# Patient Record
Sex: Male | Born: 1950
Health system: Southern US, Community
[De-identification: ages and names within clinical notes are randomized; demographics above are authoritative.]

## PROBLEM LIST (undated history)

## (undated) DIAGNOSIS — E785 Hyperlipidemia, unspecified: Secondary | ICD-10-CM

## (undated) DIAGNOSIS — I517 Cardiomegaly: Secondary | ICD-10-CM

## (undated) DIAGNOSIS — F419 Anxiety disorder, unspecified: Secondary | ICD-10-CM

## (undated) DIAGNOSIS — I82409 Acute embolism and thrombosis of unspecified deep veins of unspecified lower extremity: Secondary | ICD-10-CM

## (undated) DIAGNOSIS — I10 Essential (primary) hypertension: Secondary | ICD-10-CM

## (undated) DIAGNOSIS — E119 Type 2 diabetes mellitus without complications: Secondary | ICD-10-CM

## (undated) DIAGNOSIS — C649 Malignant neoplasm of unspecified kidney, except renal pelvis: Secondary | ICD-10-CM

## (undated) HISTORY — DX: Essential (primary) hypertension: I10

## (undated) HISTORY — DX: Anxiety disorder, unspecified: F41.9

## (undated) HISTORY — DX: Malignant neoplasm of unspecified kidney, except renal pelvis: C64.9

## (undated) HISTORY — DX: Hyperlipidemia, unspecified: E78.5

## (undated) HISTORY — DX: Acute embolism and thrombosis of unspecified deep veins of unspecified lower extremity: I82.409

## (undated) HISTORY — DX: Type 2 diabetes mellitus without complications: E11.9

---

## 2011-11-18 ENCOUNTER — Emergency Department (HOSPITAL_COMMUNITY): Payer: BC Managed Care – PPO

## 2011-11-18 ENCOUNTER — Inpatient Hospital Stay (HOSPITAL_COMMUNITY)
Admission: EM | Admit: 2011-11-18 | Discharge: 2011-11-20 | DRG: 089 | Disposition: A | Discharge status: Home / Self Care | Payer: BC Managed Care – PPO | Attending: Internal Medicine | Admitting: Internal Medicine

## 2011-11-18 ENCOUNTER — Encounter (HOSPITAL_COMMUNITY): Payer: Self-pay | Admitting: *Deleted

## 2011-11-18 DIAGNOSIS — D1809 Hemangioma of other sites: Secondary | ICD-10-CM | POA: Diagnosis present

## 2011-11-18 DIAGNOSIS — F17200 Nicotine dependence, unspecified, uncomplicated: Secondary | ICD-10-CM | POA: Diagnosis present

## 2011-11-18 DIAGNOSIS — K760 Fatty (change of) liver, not elsewhere classified: Secondary | ICD-10-CM | POA: Diagnosis present

## 2011-11-18 DIAGNOSIS — D72829 Elevated white blood cell count, unspecified: Secondary | ICD-10-CM

## 2011-11-18 DIAGNOSIS — E669 Obesity, unspecified: Secondary | ICD-10-CM | POA: Diagnosis present

## 2011-11-18 DIAGNOSIS — R0902 Hypoxemia: Secondary | ICD-10-CM

## 2011-11-18 DIAGNOSIS — F3289 Other specified depressive episodes: Secondary | ICD-10-CM | POA: Diagnosis present

## 2011-11-18 DIAGNOSIS — F411 Generalized anxiety disorder: Secondary | ICD-10-CM | POA: Diagnosis present

## 2011-11-18 DIAGNOSIS — E871 Hypo-osmolality and hyponatremia: Secondary | ICD-10-CM

## 2011-11-18 DIAGNOSIS — F1010 Alcohol abuse, uncomplicated: Secondary | ICD-10-CM | POA: Diagnosis present

## 2011-11-18 DIAGNOSIS — Z720 Tobacco use: Secondary | ICD-10-CM | POA: Diagnosis present

## 2011-11-18 DIAGNOSIS — I10 Essential (primary) hypertension: Secondary | ICD-10-CM | POA: Diagnosis present

## 2011-11-18 DIAGNOSIS — J189 Pneumonia, unspecified organism: Principal | ICD-10-CM | POA: Diagnosis present

## 2011-11-18 DIAGNOSIS — F329 Major depressive disorder, single episode, unspecified: Secondary | ICD-10-CM | POA: Diagnosis present

## 2011-11-18 DIAGNOSIS — Z6833 Body mass index (BMI) 33.0-33.9, adult: Secondary | ICD-10-CM

## 2011-11-18 DIAGNOSIS — Z79899 Other long term (current) drug therapy: Secondary | ICD-10-CM

## 2011-11-18 DIAGNOSIS — K7689 Other specified diseases of liver: Secondary | ICD-10-CM | POA: Diagnosis present

## 2011-11-18 DIAGNOSIS — R51 Headache: Secondary | ICD-10-CM | POA: Diagnosis present

## 2011-11-18 HISTORY — DX: Cardiomegaly: I51.7

## 2011-11-18 LAB — CBC WITH DIFFERENTIAL/PLATELET
Lymphocytes Relative: 16 % (ref 12–46)
Lymphs Abs: 3 10*3/uL (ref 0.7–4.0)
Neutrophils Relative %: 78 % — ABNORMAL HIGH (ref 43–77)
Platelets: 302 10*3/uL (ref 150–400)
RBC: 4.09 MIL/uL — ABNORMAL LOW (ref 4.22–5.81)
WBC: 19.2 10*3/uL — ABNORMAL HIGH (ref 4.0–10.5)

## 2011-11-18 LAB — BASIC METABOLIC PANEL
CO2: 27 mEq/L (ref 19–32)
Calcium: 8.9 mg/dL (ref 8.4–10.5)
GFR calc non Af Amer: >90 mL/min (ref >90)
Sodium: 130 mEq/L — ABNORMAL LOW (ref 135–145)

## 2011-11-18 LAB — LACTIC ACID, PLASMA: Lactic Acid, Venous: 0.8 mmol/L (ref 0.5–2.2)

## 2011-11-18 MED ORDER — ALBUTEROL SULFATE (5 MG/ML) 0.5% IN NEBU: 5.0000 mg | INHALATION_SOLUTION | Freq: Once | RESPIRATORY_TRACT | Status: DC

## 2011-11-18 MED ORDER — SODIUM CHLORIDE 0.9 % IV SOLN
Freq: Once | INTRAVENOUS | Status: AC
  Administered 2011-11-18: 1000 mL via INTRAVENOUS

## 2011-11-18 MED ORDER — LEVOFLOXACIN IN D5W 750 MG/150ML IV SOLN
750.0000 mg | Freq: Once | INTRAVENOUS | Status: AC
  Administered 2011-11-18: 750 mg via INTRAVENOUS
  Filled 2011-11-18: qty 150

## 2011-11-18 MED ORDER — ONDANSETRON HCL 4 MG/2ML IJ SOLN: 4.0000 mg | Freq: Three times a day (TID) | INTRAMUSCULAR | Status: AC | PRN

## 2011-11-18 MED ORDER — IPRATROPIUM BROMIDE 0.02 % IN SOLN: 0.5000 mg | Freq: Once | RESPIRATORY_TRACT | Status: DC

## 2011-11-18 MED ORDER — IPRATROPIUM BROMIDE 0.02 % IN SOLN
1.0000 mg | Freq: Once | RESPIRATORY_TRACT | Status: AC
  Administered 2011-11-18: 1 mg via RESPIRATORY_TRACT
  Filled 2011-11-18: qty 5

## 2011-11-18 MED ORDER — ALBUTEROL SULFATE (5 MG/ML) 0.5% IN NEBU
10.0000 mg | INHALATION_SOLUTION | Freq: Once | RESPIRATORY_TRACT | Status: AC
  Administered 2011-11-18: 10 mg via RESPIRATORY_TRACT
  Filled 2011-11-18: qty 2

## 2011-11-18 MED ORDER — ALBUTEROL SULFATE (5 MG/ML) 0.5% IN NEBU: 2.5000 mg | INHALATION_SOLUTION | RESPIRATORY_TRACT | Status: AC

## 2011-11-18 MED ORDER — SODIUM CHLORIDE 0.9 % IV SOLN
INTRAVENOUS | Status: AC
  Administered 2011-11-18: 22:00:00 via INTRAVENOUS

## 2011-11-18 NOTE — ED Provider Notes (Signed)
History     CSN: [id number]  Arrival date & time 11/18/11  1652   First MD Initiated Contact with Patient 11/18/11 1755      Chief Complaint  Patient presents with  . Shortness of Breath    HPI Pt was seen at 1820.  Per pt, c/o gradual onset and worsening of persistent cough, wheezing, and SOB for the past 5 days.  Has been associated with subjective home fevers and "chills."  Pt was eval by his PMD today  PTA and then sent to the ED for further eval.  Denies CP/palpitations, no abd pain, no back pain, no N/V/D, no rash.     Past Medical History  Diagnosis Date  . Enlarged heart     History reviewed. No pertinent past surgical history.   History  Substance Use Topics  . Smoking status: Current Every Day Smoker  . Smokeless tobacco: Not on file  . Alcohol Use: Yes      Review of Systems ROS: Statement: All systems negative except as marked or noted in the HPI; Constitutional: Negative for fever and +chills. ; ; Eyes: Negative for eye pain, redness and discharge. ; ; ENMT: Negative for ear pain, hoarseness, nasal congestion, sinus pressure and sore throat. ; ; Cardiovascular: Negative for chest pain, palpitations, diaphoresis, and peripheral edema. ; ; Respiratory: +cough, wheezing, SOB. Negative for stridor. ; ; Gastrointestinal: Negative for nausea, vomiting, diarrhea, abdominal pain, blood in stool, hematemesis, jaundice and rectal bleeding. . ; ; Genitourinary: Negative for dysuria, flank pain and hematuria. ; ; Musculoskeletal: Negative for back pain and neck pain. Negative for swelling and trauma.; ; Skin: Negative for pruritus, rash, abrasions, blisters, bruising and skin lesion.; ; Neuro: Negative for headache, lightheadedness and neck stiffness. Negative for weakness, altered level of consciousness , altered mental status, extremity weakness, paresthesias, involuntary movement, seizure and syncope.     Allergies  Review of patient's allergies indicates no known  allergies.  Home Medications   Current Outpatient Rx  Name Route Sig Dispense Refill  . DM-GUAIFENESIN ER 30-600 MG PO TB12 Oral Take 1 tablet by mouth every 12 (twelve) hours.    [name] DIPHENHYDRAMINE-APAP (SLEEP) 25-500 MG PO TABS Oral Take 2 tablets by mouth at bedtime as needed. For pain/sleep      BP 127/71  Pulse 91  Temp 99.2 F (37.3 C) (Oral)  Resp 18  Wt 219 lb (99.338 kg)  SpO2 92%  Physical Exam 1825: Physical examination:  Nursing notes reviewed; Vital signs and O2 SAT reviewed;  Constitutional: Well developed, Well nourished, Well hydrated, In no acute distress; Head:  Normocephalic, atraumatic; Eyes: EOMI, PERRL, No scleral icterus; ENMT: Mouth and pharynx normal, Mucous membranes moist; Neck: Supple, Full range of motion, No lymphadenopathy; Cardiovascular: Regular rate and rhythm, No gallop; Respiratory: Breath sounds coarse & equal bilaterally, with insp and exp wheezes bilat. No audible wheezing.  Speaking full sentences, Normal respiratory effort/excursion; Chest: Nontender, Movement normal; Abdomen: Soft, Nontender, Nondistended, Normal bowel sounds;; Extremities: Pulses normal, No tenderness, No edema, No calf edema or asymmetry.; Neuro: AA&Ox3, Major CN grossly intact.  Speech clear. No gross focal motor or sensory deficits in extremities.; Skin: Color normal, Warm, Dry.   ED Course  Procedures    MDM  MDM Reviewed: nursing note and vitals Interpretation: labs and x-ray Total time providing critical care: 30-74 minutes. This excludes time spent performing separately reportable procedures and services. Consults: admitting MD   CRITICAL CARE Performed by: Laray Anger Total critical care  time: 35 Critical care time was exclusive of separately billable procedures and treating other patients. Critical care was necessary to treat or prevent imminent or life-threatening deterioration. Critical care was time spent personally by me on the following activities:  development of treatment plan with patient and/or surrogate as well as nursing, discussions with consultants, evaluation of patient's response to treatment, examination of patient, obtaining history from patient or surrogate, ordering and performing treatments and interventions, ordering and review of laboratory studies, ordering and review of radiographic studies, pulse oximetry and re-evaluation of patient's condition.    Dg Chest 2 View 11/18/2011  *RADIOLOGY REPORT*  Clinical Data: Cough, shortness of breath  CHEST - 2 VIEW  Comparison: None.  Findings: Moderate patchy airspace opacities in the right lower lobe, more confluent in the posterior and medial basal segments. There is mild left perihilar interstitial prominence without confluent infiltrate.  No definite effusion.  Heart size upper limits normal.  Mildly tortuous thoracic aorta.  IMPRESSION:  1.  Right lower lobe pneumonia.  Recommend follow-up to confirm appropriate resolution, exclude underlying lesion.   Original Report Authenticated By: Osa Craver, M.D.       Results for orders placed during the hospital encounter of 11/18/11  BASIC METABOLIC PANEL      Component Value Range   Sodium 130 (*) 135 - 145 mEq/L   Potassium 3.5  3.5 - 5.1 mEq/L   Chloride 90 (*) 96 - 112 mEq/L   CO2 27  19 - 32 mEq/L   Glucose, Bld 152 (*) 70 - 99 mg/dL   BUN 10  6 - 23 mg/dL   Creatinine, Ser 0.98 (*) 0.50 - 1.35 mg/dL   Calcium 8.9  8.4 - 11.9 mg/dL   GFR calc non Af Amer >90  >90 mL/min   GFR calc Af Amer >90  >90 mL/min  CBC WITH DIFFERENTIAL      Component Value Range   WBC 19.2 (*) 4.0 - 10.5 K/uL   RBC 4.09 (*) 4.22 - 5.81 MIL/uL   Hemoglobin 13.5  13.0 - 17.0 g/dL   HCT 14.7  82.9 - 56.2 %   MCV 95.4  78.0 - 100.0 fL   MCH 33.0  26.0 - 34.0 pg   MCHC 34.6  30.0 - 36.0 g/dL   RDW 13.0  86.5 - 78.4 %   Platelets 302  150 - 400 K/uL   Neutrophils Relative 78 (*) 43 - 77 %   Neutro Abs 14.9 (*) 1.7 - 7.7 K/uL    Lymphocytes Relative 16  12 - 46 %   Lymphs Abs 3.0  0.7 - 4.0 K/uL   Monocytes Relative 6  3 - 12 %   Monocytes Absolute 1.2 (*) 0.1 - 1.0 K/uL   Eosinophils Relative 0  0 - 5 %   Eosinophils Absolute 0.1  0.0 - 0.7 K/uL   Basophils Relative 0  0 - 1 %   Basophils Absolute 0.0  0.0 - 0.1 K/uL  LACTIC ACID, PLASMA      Component Value Range   Lactic Acid, Venous 0.8  0.5 - 2.2 mmol/L     1945:  Pt's Sats on arrival 88% R/A, increased to 90-92% on O2 2L N/C.  Sats 97-98% on continuous neb.  Denies CP.  IV levaquin for CAP ordered.  SBP stable and lactic acid normal; doubt sepsis at this time.  Dx and testing d/w pt and family.  Questions answered.  Verb understanding, agreeable to admit.  T/C to [facility]. Phillips Odor, case discussed, including:  HPI, pertinent PM/SHx, VS/PE, dx testing, ED course and treatment:  Agreeable to observation admit.       Laray Anger, DO 11/20/11 2253

## 2011-11-18 NOTE — ED Notes (Signed)
Sent from dr Deitra Mayo office, sob, fever, cough, chills,

## 2011-11-18 NOTE — ED Notes (Signed)
Confirmed with Dr. Clarene Duke that no blood cultures were needed before administering levaquin.

## 2011-11-18 NOTE — ED Notes (Signed)
Pt c/o cold symptoms x 1 week and has been taking mucinex.  C/O worsening cough, fever, chills, and SOB.

## 2011-11-18 NOTE — ED Notes (Signed)
Room assignment given . Will call report when nurse who has been called in is present for report

## 2011-11-18 NOTE — ED Notes (Signed)
Pt had taken off 02 and sat on room air was 89%.  Placed pt back on 2 liters and sat increased to 94%.  Pt 97% on breathing treatment.

## 2011-11-19 ENCOUNTER — Observation Stay (HOSPITAL_COMMUNITY): Payer: BC Managed Care – PPO

## 2011-11-19 DIAGNOSIS — K7689 Other specified diseases of liver: Secondary | ICD-10-CM

## 2011-11-19 DIAGNOSIS — J189 Pneumonia, unspecified organism: Secondary | ICD-10-CM | POA: Diagnosis present

## 2011-11-19 DIAGNOSIS — R0902 Hypoxemia: Secondary | ICD-10-CM | POA: Diagnosis present

## 2011-11-19 DIAGNOSIS — K760 Fatty (change of) liver, not elsewhere classified: Secondary | ICD-10-CM | POA: Diagnosis present

## 2011-11-19 DIAGNOSIS — F1010 Alcohol abuse, uncomplicated: Secondary | ICD-10-CM

## 2011-11-19 DIAGNOSIS — E871 Hypo-osmolality and hyponatremia: Secondary | ICD-10-CM | POA: Diagnosis present

## 2011-11-19 DIAGNOSIS — D72829 Elevated white blood cell count, unspecified: Secondary | ICD-10-CM | POA: Diagnosis present

## 2011-11-19 DIAGNOSIS — Z720 Tobacco use: Secondary | ICD-10-CM | POA: Diagnosis present

## 2011-11-19 LAB — URINALYSIS, ROUTINE W REFLEX MICROSCOPIC
Glucose, UA: NEGATIVE mg/dL
Leukocytes, UA: NEGATIVE
Protein, ur: 30 mg/dL — AB
Specific Gravity, Urine: >1.03 — ABNORMAL HIGH (ref 1.005–1.030)

## 2011-11-19 LAB — HEPATIC FUNCTION PANEL
ALT: 31 U/L (ref 0–53)
AST: 21 U/L (ref 0–37)
Albumin: 2.8 g/dL — ABNORMAL LOW (ref 3.5–5.2)
Alkaline Phosphatase: 90 U/L (ref 39–117)
Total Bilirubin: 0.4 mg/dL (ref 0.3–1.2)
Total Protein: 7.1 g/dL (ref 6.0–8.3)

## 2011-11-19 LAB — BASIC METABOLIC PANEL
BUN: 9 mg/dL (ref 6–23)
CO2: 27 mEq/L (ref 19–32)
Chloride: 95 mEq/L — ABNORMAL LOW (ref 96–112)
Creatinine, Ser: 0.47 mg/dL — ABNORMAL LOW (ref 0.50–1.35)
GFR calc Af Amer: >90 mL/min (ref >90)
Potassium: 3.6 mEq/L (ref 3.5–5.1)

## 2011-11-19 LAB — CBC
HCT: 38.2 % — ABNORMAL LOW (ref 39.0–52.0)
MCV: 95.7 fL (ref 78.0–100.0)
Platelets: 312 10*3/uL (ref 150–400)
RBC: 3.99 MIL/uL — ABNORMAL LOW (ref 4.22–5.81)
RDW: 13 % (ref 11.5–15.5)
WBC: 20.2 10*3/uL — ABNORMAL HIGH (ref 4.0–10.5)

## 2011-11-19 LAB — URINE MICROSCOPIC-ADD ON

## 2011-11-19 LAB — EXPECTORATED SPUTUM ASSESSMENT W GRAM STAIN, RFLX TO RESP C

## 2011-11-19 LAB — GLUCOSE, CAPILLARY

## 2011-11-19 MED ORDER — FOLIC ACID 1 MG PO TABS
1.0000 mg | ORAL_TABLET | Freq: Every day | ORAL | Status: DC
  Administered 2011-11-19 – 2011-11-20 (×2): 1 mg via ORAL
  Filled 2011-11-19 (×2): qty 1

## 2011-11-19 MED ORDER — ALBUTEROL SULFATE (5 MG/ML) 0.5% IN NEBU
2.5000 mg | INHALATION_SOLUTION | Freq: Four times a day (QID) | RESPIRATORY_TRACT | Status: DC
  Administered 2011-11-19 – 2011-11-20 (×6): 2.5 mg via RESPIRATORY_TRACT
  Filled 2011-11-19 (×5): qty 0.5

## 2011-11-19 MED ORDER — LEVOFLOXACIN 500 MG PO TABS
750.0000 mg | ORAL_TABLET | Freq: Every day | ORAL | Status: DC
  Administered 2011-11-19 – 2011-11-20 (×2): 750 mg via ORAL
  Filled 2011-11-19 (×2): qty 1

## 2011-11-19 MED ORDER — FOLIC ACID 5 MG/ML IJ SOLN
INTRAMUSCULAR | Status: AC
  Filled 2011-11-19: qty 0.2

## 2011-11-19 MED ORDER — ALUM & MAG HYDROXIDE-SIMETH 200-200-20 MG/5ML PO SUSP: 30.0000 mL | Freq: Four times a day (QID) | ORAL | Status: DC | PRN

## 2011-11-19 MED ORDER — M.V.I. ADULT IV INJ
INJECTION | INTRAVENOUS | Status: AC
  Filled 2011-11-19: qty 10

## 2011-11-19 MED ORDER — THIAMINE HCL 100 MG/ML IJ SOLN
100.0000 mg | Freq: Every day | INTRAMUSCULAR | Status: DC
  Filled 2011-11-19: qty 2

## 2011-11-19 MED ORDER — VITAMIN B-1 100 MG PO TABS
100.0000 mg | ORAL_TABLET | Freq: Every day | ORAL | Status: DC
  Administered 2011-11-19 – 2011-11-20 (×2): 100 mg via ORAL
  Filled 2011-11-19 (×2): qty 1

## 2011-11-19 MED ORDER — OXYCODONE HCL 5 MG PO TABS: 5.0000 mg | ORAL_TABLET | ORAL | Status: DC | PRN

## 2011-11-19 MED ORDER — LEVOFLOXACIN IN D5W 750 MG/150ML IV SOLN
750.0000 mg | INTRAVENOUS | Status: DC
  Filled 2011-11-19: qty 150

## 2011-11-19 MED ORDER — TRAZODONE HCL 50 MG PO TABS: 25.0000 mg | ORAL_TABLET | Freq: Every evening | ORAL | Status: DC | PRN

## 2011-11-19 MED ORDER — THIAMINE HCL 100 MG/ML IJ SOLN
Freq: Once | INTRAVENOUS | Status: AC
  Administered 2011-11-19: 02:00:00 via INTRAVENOUS
  Filled 2011-11-19: qty 1000

## 2011-11-19 MED ORDER — ONDANSETRON HCL 4 MG PO TABS: 4.0000 mg | ORAL_TABLET | Freq: Four times a day (QID) | ORAL | Status: DC | PRN

## 2011-11-19 MED ORDER — IPRATROPIUM BROMIDE 0.02 % IN SOLN
0.5000 mg | Freq: Four times a day (QID) | RESPIRATORY_TRACT | Status: DC
  Administered 2011-11-19 – 2011-11-20 (×6): 0.5 mg via RESPIRATORY_TRACT
  Filled 2011-11-19 (×5): qty 2.5

## 2011-11-19 MED ORDER — SODIUM CHLORIDE 0.9 % IJ SOLN
INTRAMUSCULAR | Status: AC
  Administered 2011-11-19: 12:00:00
  Filled 2011-11-19: qty 3

## 2011-11-19 MED ORDER — ALBUTEROL SULFATE (5 MG/ML) 0.5% IN NEBU: 2.5000 mg | INHALATION_SOLUTION | RESPIRATORY_TRACT | Status: DC | PRN

## 2011-11-19 MED ORDER — ONDANSETRON HCL 4 MG/2ML IJ SOLN: 4.0000 mg | Freq: Four times a day (QID) | INTRAMUSCULAR | Status: DC | PRN

## 2011-11-19 MED ORDER — LORAZEPAM 1 MG PO TABS: 1.0000 mg | ORAL_TABLET | ORAL | Status: DC | PRN

## 2011-11-19 MED ORDER — FOLIC ACID 5 MG/ML IJ SOLN
1.0000 mg | Freq: Every day | INTRAMUSCULAR | Status: DC
  Filled 2011-11-19: qty 0.2

## 2011-11-19 MED ORDER — FOLIC ACID 5 MG/ML IJ SOLN
1.0000 mg | Freq: Every day | INTRAMUSCULAR | Status: DC
  Filled 2011-11-19 (×3): qty 0.2

## 2011-11-19 MED ORDER — ENOXAPARIN SODIUM 40 MG/0.4ML ~~LOC~~ SOLN
40.0000 mg | SUBCUTANEOUS | Status: DC
  Administered 2011-11-19 – 2011-11-20 (×2): 40 mg via SUBCUTANEOUS
  Filled 2011-11-19 (×3): qty 0.4

## 2011-11-19 MED ORDER — THIAMINE HCL 100 MG/ML IJ SOLN: 100.0000 mg | Freq: Every day | INTRAMUSCULAR | Status: DC

## 2011-11-19 MED ORDER — POLYETHYLENE GLYCOL 3350 17 G PO PACK: 17.0000 g | PACK | Freq: Every day | ORAL | Status: DC | PRN

## 2011-11-19 MED ORDER — THIAMINE HCL 100 MG/ML IJ SOLN
INTRAMUSCULAR | Status: AC
  Filled 2011-11-19: qty 2

## 2011-11-19 MED ORDER — SODIUM CHLORIDE 0.9 % IJ SOLN
INTRAMUSCULAR | Status: AC
  Filled 2011-11-19: qty 3

## 2011-11-19 NOTE — Progress Notes (Signed)
pts O2 sat was measured at 90% at rest, while ambulating, O2 sat increased to 91-95%. Pt tolerated ambulation very well, with minimal SOB. George Anthony

## 2011-11-19 NOTE — H&P (Addendum)
[facility] History and Physical  George Anthony ZOX:[id number] DOB: 12-30-1950 DOA: 11/18/2011  Referring physician: EDP PCP: Josue Hector, MD  Specialists: none  Chief Complaint: Cough  HPI: George Anthony is a 61 y.o. male with a medical history significant for alcoholism, heavy tobacco abuse, and hypertension who presented to [facility] c/o of cough with 5 days of fevers, chills, weakness and shortness of breath. He has had poor PO intake for the past 3 days, last ETOH drink was 4 days ago. Denies and NV, abdominal pain or chest pain. CXR in ED showed significant RLL PNA, he was also hypoxemic at 88% requiring O2 and febrile. Admitted for IV abx and monitoring for CAP.  Review of Systems: Review of Systems  Constitutional: Positive for fever, chills, malaise/fatigue and diaphoresis.  HENT: Positive for congestion and sore throat.   Eyes: Negative.   Respiratory: Positive for cough, sputum production and shortness of breath.   Cardiovascular: Negative.   Gastrointestinal: Positive for heartburn. Negative for nausea, vomiting and abdominal pain.  Genitourinary: Negative.   Musculoskeletal: Negative.   Skin: Negative for itching and rash.  Neurological: Positive for dizziness, weakness and headaches.  Endo/Heme/Allergies: Negative.   Psychiatric/Behavioral: Positive for depression and substance abuse. The patient is nervous/anxious.      Past Medical History  Diagnosis Date  . Enlarged heart    History reviewed. No pertinent past surgical history. Social History:  reports that he has been smoking.  He does not have any smokeless tobacco history on file. He reports that he drinks alcohol. He reports that he does not use illicit drugs. No Known Allergies  History reviewed. No pertinent family history.  No family history of Lung Malignancy.  Prior to Admission medications   Medication Sig Start Date End Date Taking? Authorizing Provider  dextromethorphan-guaiFENesin (MUCINEX  DM) 30-600 MG per 12 hr tablet Take 1 tablet by mouth every 12 (twelve) hours.   Yes Historical Provider, MD  diphenhydramine-acetaminophen (TYLENOL PM) 25-500 MG TABS Take 2 tablets by mouth at bedtime as needed. For pain/sleep   Yes Historical Provider, MD   Physical Exam: Filed Vitals:   11/18/11 2150 11/19/11 0007 11/19/11 0008 11/19/11 0046  BP: 128/76     Pulse: 100     Temp: 98 F (36.7 C)     TempSrc: Oral     Resp: 20     Height:   5\' 7"  (1.702 m)   Weight: 95.9 kg (211 lb 6.7 oz)     SpO2: 95% 95%  95%     General:  Ill appearing gentleman, NAD, Alert, cooperative  Eyes: PERRL  ENT: dry MM  Neck: supple  Cardiovascular: Tachycardic, no mrg  Respiratory: course diffuse rhonchi, crackes right base>left  Abdomen: large obese abdomen, question ascites-but no fluid wave or stigmata of liver disease.  Skin: no rashes  Musculoskeletal: normal  Psychiatric: slightly depressed appearing, poorly groomed and disheveled  Neurologic: non-focal  Labs on Admission:  Basic Metabolic Panel:  Lab 11/19/11 8119 11/18/11 1858  NA -- 130*  K -- 3.5  CL -- 90*  CO2 -- 27  GLUCOSE -- 152*  BUN -- 10  CREATININE -- 0.47*  CALCIUM -- 8.9  MG 2.4 --  PHOS 2.6 --   Liver Function Tests:  Lab 11/19/11 0018  AST 21  ALT 31  ALKPHOS 90  BILITOT 0.4  PROT 7.1  ALBUMIN 2.8*   No results found for this basename: LIPASE:5,AMYLASE:5 in the last 168 hours No results found for  this basename: AMMONIA:5 in the last 168 hours CBC:  Lab 11/18/11 1858  WBC 19.2*  NEUTROABS 14.9*  HGB 13.5  HCT 39.0  MCV 95.4  PLT 302   Radiological Exams on Admission: Dg Chest 2 View  11/18/2011  *RADIOLOGY REPORT*  Clinical Data: Cough, shortness of breath  CHEST - 2 VIEW  Comparison: None.  Findings: Moderate patchy airspace opacities in the right lower lobe, more confluent in the posterior and medial basal segments. There is mild left perihilar interstitial prominence without  confluent infiltrate.  No definite effusion.  Heart size upper limits normal.  Mildly tortuous thoracic aorta.  IMPRESSION:  1.  Right lower lobe pneumonia.  Recommend follow-up to confirm appropriate resolution, exclude underlying lesion.   Original Report Authenticated By: Osa Craver, M.D.     EKG: pending at admission.  Assessment/Plan Principal Problem:  *CAP (community acquired pneumonia) Active Problems:  Hyponatremia  Hypoxia  Leukocytosis  Alcohol abuse  Tobacco abuse   1. Community Acquired PNA in heavy smoker with ETOH abuse and overall poor health, was hypoxic on arrival to ED. Fever and Leukocytosis.  Started on IV Levaquin in ED, will continue this regimen  Blood Cultures, other PNA core measures orders.  Sputum Cx pending and Strep P urinary antigen  Supplemental O2  Strong rec to quit smoking, will need interval f/u CXR for resolution to make sure this isn't related to a malignancy.  IV fluids  2.  ETOH abuse/ Tobacco Abuse: For his addictive disease he will be monitored on CIWA and started on Thiamine, Folate and Nicoderm  3. ?Ascites: LFT's ok, low albumin- will check abdominal US to look for ascites.  Code Status: Full Code Family Communication: discussed care plan with patient Disposition Plan:  1-2 days anticipated stay -home when medically stable.  Time spent: 46  [facility] Pager [phone]  If 7PM-7AM, please contact night-coverage [email or web address] Password [email or web address] 11/19/2011, 3:46 AM

## 2011-11-19 NOTE — Progress Notes (Signed)
UR Chart Review Completed  

## 2011-11-19 NOTE — Progress Notes (Signed)
ANTIBIOTIC CONSULT NOTE - INITIAL  Pharmacy Consult for Levaquin Indication: rule out pneumonia  No Known Allergies  Patient Measurements: Height: 5\' 7"  (170.2 cm) Weight: 211 lb 6.7 oz (95.9 kg) IBW/kg (Calculated) : 66.1   Vital Signs: Temp: 100.9 F (38.3 C) (10/30 0649) Temp src: Oral (10/30 0649) BP: 144/80 mmHg (10/30 0649) Pulse Rate: 74  (10/30 0649) Intake/Output from previous day: 10/29 0701 - 10/30 0700 In: 742.1 [I.V.:742.1] Out: 200 [Urine:200] Intake/Output from this shift:    Labs:  [facility] 11/19/11 0528 11/18/11 1858  WBC 20.2* 19.2*  HGB 12.8* 13.5  PLT 312 302  LABCREA -- --  CREATININE 0.47* 0.47*   Estimated Creatinine Clearance: 108.3 ml/min (by C-G formula based on Cr of 0.47). No results found for this basename: VANCOTROUGH:2,VANCOPEAK:2,VANCORANDOM:2,GENTTROUGH:2,GENTPEAK:2,GENTRANDOM:2,TOBRATROUGH:2,TOBRAPEAK:2,TOBRARND:2,AMIKACINPEAK:2,AMIKACINTROU:2,AMIKACIN:2, in the last 72 hours   Microbiology: Recent Results (from the past 720 hour(s))  CULTURE, BLOOD (ROUTINE X 2)     Status: Normal (Preliminary result)   Collection Time   11/19/11 12:12 AM      Component Value Range Status Comment   Specimen Description Blood LEFT ARM   Final    Special Requests BOTTLES DRAWN AEROBIC AND ANAEROBIC 8 CC EACH   Final    Culture PENDING   Incomplete    Report Status PENDING   Incomplete   CULTURE, BLOOD (ROUTINE X 2)     Status: Normal (Preliminary result)   Collection Time   11/19/11 12:19 AM      Component Value Range Status Comment   Specimen Description Blood LEFT HAND   Final    Special Requests BOTTLES DRAWN AEROBIC AND ANAEROBIC 8 CC EACH   Final    Culture PENDING   Incomplete    Report Status PENDING   Incomplete     Medical History: Past Medical History  Diagnosis Date  . Enlarged heart     Medications:  Scheduled:    . sodium chloride   Intravenous Once  . sodium chloride   Intravenous STAT  . albuterol  10 mg Nebulization  Once  . albuterol  2.5 mg Nebulization Q4H  . albuterol  2.5 mg Nebulization Q6H  . enoxaparin (LOVENOX) injection  40 mg Subcutaneous Q24H  . folic acid  1 mg Intravenous Daily  . ipratropium  0.5 mg Nebulization Q6H  . ipratropium  1 mg Nebulization Once  . levofloxacin  750 mg Intravenous Once  . levofloxacin (LEVAQUIN) IV  750 mg Intravenous Q24H  . general admission iv infusion   Intravenous Once  . thiamine  100 mg Intravenous Daily  . DISCONTD: albuterol  5 mg Nebulization Once  . DISCONTD: folic acid  1 mg Intravenous Daily  . DISCONTD: ipratropium  0.5 mg Nebulization Once  . DISCONTD: thiamine  100 mg Intravenous Daily   Assessment: 61yo male admitted with fevers, chills, weakness, and SOB.  Good renal fxn.  Estimated Creatinine Clearance: 108.3 ml/min (by C-G formula based on Cr of 0.47). Started on Levaquin 750mg  iv q24hrs.  No adjustment needed at this time.  Goal of Therapy:  Eradicate infection.  Plan: Continue Levaquin 750mg  IV q24hrs Monitor labs, renal fxn, and cultures per protocol  Margo Aye, Shantee Hayne A 11/19/2011,8:11 AM

## 2011-11-19 NOTE — Progress Notes (Signed)
Chart reviewed.  Subjective: Feels better. Has not walked around March. Appetite good. Vague about his desire to quit smoking and drinking.  Objective: Vital signs in last 24 hours: Filed Vitals:   11/19/11 0702 11/19/11 1100 11/19/11 1400 11/19/11 1438  BP:  147/84 134/55   Pulse:  83 80   Temp:  98.9 F (37.2 C) 99 F (37.2 C)   TempSrc:  Axillary Oral   Resp:  20 20   Height:      Weight:      SpO2: 92% 91% 94% 95%   Weight change:   Intake/Output Summary (Last 24 hours) at 11/19/11 1720 Last data filed at 11/19/11 1200  Gross per 24 hour  Intake 1225.08 ml  Output    200 ml  Net 1025.08 ml   General: Comfortable. Mumbles answers yes or no. Eating supper. Not wearing oxygen Lungs: Mild rhonchi. No rales or wheeze. Cardiovascular regular rate rhythm without murmurs gallops rubs Abdomen obese, soft nontender Extremities no clubbing cyanosis or edema next Neurologic: Alert oriented cooperative no tremor noted.  Lab Results: Basic Metabolic Panel:  Lab 11/19/11 1610 11/19/11 0018 11/18/11 1858  NA 133* -- 130*  K 3.6 -- 3.5  CL 95* -- 90*  CO2 27 -- 27  GLUCOSE 155* -- 152*  BUN 9 -- 10  CREATININE 0.47* -- 0.47*  CALCIUM 8.9 -- 8.9  MG -- 2.4 --  PHOS -- 2.6 --   Liver Function Tests:  Lab 11/19/11 0018  AST 21  ALT 31  ALKPHOS 90  BILITOT 0.4  PROT 7.1  ALBUMIN 2.8*   No results found for this basename: LIPASE:2,AMYLASE:2 in the last 168 hours No results found for this basename: AMMONIA:2 in the last 168 hours CBC:  Lab 11/19/11 0528 11/18/11 1858  WBC 20.2* 19.2*  NEUTROABS -- 14.9*  HGB 12.8* 13.5  HCT 38.2* 39.0  MCV 95.7 95.4  PLT 312 302   Cardiac Enzymes: No results found for this basename: CKTOTAL:3,CKMB:3,CKMBINDEX:3,TROPONINI:3 in the last 168 hours BNP: No results found for this basename: PROBNP:3 in the last 168 hours D-Dimer: No results found for this basename: DDIMER:2 in the last 168 hours CBG:  Lab 11/19/11 0742  GLUCAP  146*   Hemoglobin A1C: No results found for this basename: HGBA1C in the last 168 hours Fasting Lipid Panel: No results found for this basename: CHOL,HDL,LDLCALC,TRIG,CHOLHDL,LDLDIRECT in the last 960 hours Thyroid Function Tests:  Lab 11/19/11 0018  TSH 0.718  T4TOTAL --  FREET4 --  T3FREE --  THYROIDAB --   Coagulation: No results found for this basename: LABPROT:4,INR:4 in the last 168 hours Anemia Panel: No results found for this basename: VITAMINB12,FOLATE,FERRITIN,TIBC,IRON,RETICCTPCT in the last 168 hours Urine Drug Screen: Drugs of Abuse  No results found for this basename: labopia, cocainscrnur, labbenz, amphetmu, thcu, labbarb    Alcohol Level: No results found for this basename: ETH:2 in the last 168 hours Urinalysis:  Lab 11/19/11 0158  COLORURINE YELLOW  LABSPEC >1.030*  PHURINE 6.0  GLUCOSEU NEGATIVE  HGBUR MODERATE*  BILIRUBINUR MODERATE*  KETONESUR 40*  PROTEINUR 30*  UROBILINOGEN 2.0*  NITRITE NEGATIVE  LEUKOCYTESUR NEGATIVE   Micro Results: Recent Results (from the past 240 hour(s))  CULTURE, BLOOD (ROUTINE X 2)     Status: Normal (Preliminary result)   Collection Time   11/19/11 12:12 AM      Component Value Range Status Comment   Specimen Description Blood LEFT ARM   Final    Special Requests BOTTLES DRAWN AEROBIC  AND ANAEROBIC 8 CC EACH   Final    Culture NO GROWTH <24 HRS   Final    Report Status PENDING   Incomplete   CULTURE, BLOOD (ROUTINE X 2)     Status: Normal (Preliminary result)   Collection Time   11/19/11 12:19 AM      Component Value Range Status Comment   Specimen Description Blood LEFT HAND   Final    Special Requests BOTTLES DRAWN AEROBIC AND ANAEROBIC 8 CC EACH   Final    Culture NO GROWTH <24 HRS   Final    Report Status PENDING   Incomplete    Studies/Results: Dg Chest 2 View  11/18/2011  *RADIOLOGY REPORT*  Clinical Data: Cough, shortness of breath  CHEST - 2 VIEW  Comparison: None.  Findings: Moderate patchy  airspace opacities in the right lower lobe, more confluent in the posterior and medial basal segments. There is mild left perihilar interstitial prominence without confluent infiltrate.  No definite effusion.  Heart size upper limits normal.  Mildly tortuous thoracic aorta.  IMPRESSION:  1.  Right lower lobe pneumonia.  Recommend follow-up to confirm appropriate resolution, exclude underlying lesion.   Original Report Authenticated By: Osa Craver, M.D.    US Abdomen Complete  11/19/2011  *RADIOLOGY REPORT*  Clinical Data:  Clinical concern for ascites.  Evaluate liver.  COMPLETE ABDOMINAL ULTRASOUND  Comparison:  None  Findings:  Gallbladder:  Multiple gallbladder polyps are noted.  No gallstonesor pericholecystic fluid.  There is mild diffuse gallbladder wall thickening.  Common bile duct:  Borderline common bile duct with maximal measurement of 8.7 mm in the porta hepatis and 6.3 mm in the pancreatic head.  Liver:  Overall increased echogenicity with coarse echotexture suggesting fatty infiltration.  There is a small, 1.2 x 1.2 x 1.3 cm echogenic focus in the left lobe which is likely a benign hemangioma.  IVC:  Normal caliber.  Pancreas:  Not well visualized due to overlying bowel gas.  Spleen:  Normal size and echogenicity without focal lesions.  Right Kidney:  12.0 cm in length. Normal renal cortical thickness and echogenicity without focal lesions or hydronephrosis.  Left Kidney:  13.1 cm in length. Normal renal cortical thickness and echogenicity without focal lesions or hydronephrosis.  Abdominal aorta:  Normal caliber.  IMPRESSION:  1.  Gallbladder polyps and minimal gallbladder wall thickening but no gallstones or pericholecystic fluid. 2.  Borderline caliber common bile duct. 3.  Poor visualization of the pancreas. 4.  Normal sonographic appearance of the spleen and both kidneys. 5.  Diffuse fatty infiltration of the liver and left hepatic lobe hemangioma.   Original Report Authenticated  By: P. Loralie Champagne, M.D.    Scheduled Meds:   . sodium chloride   Intravenous Once  . sodium chloride   Intravenous STAT  . albuterol  10 mg Nebulization Once  . albuterol  2.5 mg Nebulization Q4H  . albuterol  2.5 mg Nebulization Q6H  . enoxaparin (LOVENOX) injection  40 mg Subcutaneous Q24H  . folic acid  1 mg Intravenous Daily  . ipratropium  0.5 mg Nebulization Q6H  . ipratropium  1 mg Nebulization Once  . levofloxacin  750 mg Intravenous Once  . levofloxacin (LEVAQUIN) IV  750 mg Intravenous Q24H  . general admission iv infusion   Intravenous Once  . sodium chloride      . sodium chloride      . thiamine  100 mg Intravenous Daily  . DISCONTD: albuterol  5 mg Nebulization Once  . DISCONTD: folic acid  1 mg Intravenous Daily  . DISCONTD: ipratropium  0.5 mg Nebulization Once  . DISCONTD: thiamine  100 mg Intravenous Daily   Continuous Infusions:  PRN Meds:.albuterol, alum & mag hydroxide-simeth, ondansetron (ZOFRAN) IV, ondansetron (ZOFRAN) IV, ondansetron, oxyCODONE, polyethylene glycol, traZODone Assessment/Plan: Principal Problem:  *CAP (community acquired pneumonia) Active Problems:  Hypoxia  Hyponatremia  Leukocytosis  Alcohol abuse  Tobacco abuse  Fatty liver  Improved. Will check ambulatory pulse ox. Change antibiotics to by mouth. Encouraged to quit smoking and drinking. May be able to go home tomorrow if stable. No signs of alcohol withdrawal.   LOS: 1 day   Darrel Baroni L 11/19/2011, 5:20 PM

## 2011-11-20 DIAGNOSIS — F17200 Nicotine dependence, unspecified, uncomplicated: Secondary | ICD-10-CM

## 2011-11-20 DIAGNOSIS — J189 Pneumonia, unspecified organism: Principal | ICD-10-CM

## 2011-11-20 LAB — LEGIONELLA ANTIGEN, URINE: Legionella Antigen, Urine: NEGATIVE

## 2011-11-20 MED ORDER — ALBUTEROL SULFATE HFA 108 (90 BASE) MCG/ACT IN AERS: 2.0000 | INHALATION_SPRAY | RESPIRATORY_TRACT | Status: DC | PRN

## 2011-11-20 MED ORDER — LEVOFLOXACIN 750 MG PO TABS: 750.0000 mg | ORAL_TABLET | Freq: Every day | ORAL | Status: DC

## 2011-11-20 NOTE — Progress Notes (Signed)
Notified Dr. Lendell Caprice of blood cultures showing. ABUNDANT WBC PRESENT,BOTH PMN AND MONONUCLEAR RARE SQUAMOUS EPITHELIAL CELLS PRESENT, FEW GRAM POSITIVE COCCI, N PAIRS RARE GRAM VARIABLE ROD. No new orders. Sheryn Bison

## 2011-11-20 NOTE — Discharge Summary (Signed)
Physician Discharge Summary  Patient ID: George Anthony MRN: [id number] DOB/AGE: 1950/08/12 61 y.o.  Admit date: 11/18/2011 Discharge date: 11/20/2011  Discharge Diagnoses:  Principal Problem:  *CAP (community acquired pneumonia) Active Problems:  Hypoxia  Hyponatremia  Leukocytosis  Alcohol abuse  Tobacco abuse  Fatty liver     Medication List     As of 11/20/2011 11:06 AM    TAKE these medications         albuterol 108 (90 BASE) MCG/ACT inhaler   Commonly known as: PROVENTIL HFA;VENTOLIN HFA   Inhale 2 puffs into the lungs every 4 (four) hours as needed for wheezing or shortness of breath.      dextromethorphan-guaiFENesin 30-600 MG per 12 hr tablet   Commonly known as: MUCINEX DM   Take 1 tablet by mouth every 12 (twelve) hours.      diphenhydramine-acetaminophen 25-500 MG Tabs   Commonly known as: TYLENOL PM   Take 2 tablets by mouth at bedtime as needed. For pain/sleep      levofloxacin 750 MG tablet   Commonly known as: LEVAQUIN   Take 1 tablet (750 mg total) by mouth daily.            Discharge Orders    Future Orders Please Complete By Expires   Diet general      Increase activity slowly         Follow-up Information    Follow up with Josue Hector, MD. In 4 weeks. (for repeat CXR)    Contact information:   [address] [facility] [address] [phone]          Disposition: home  Discharged Condition:  stable   Consults:  none  Labs:   Results for orders placed during the hospital encounter of 11/18/11 (from the past 48 hour(s))  BASIC METABOLIC PANEL     Status: Abnormal   Collection Time   11/18/11  6:58 PM      Component Value Range Comment   Sodium 130 (*) 135 - 145 mEq/L    Potassium 3.5  3.5 - 5.1 mEq/L    Chloride 90 (*) 96 - 112 mEq/L    CO2 27  19 - 32 mEq/L    Glucose, Bld 152 (*) 70 - 99 mg/dL    BUN 10  6 - 23 mg/dL    Creatinine, Ser 2.13 (*) 0.50 - 1.35 mg/dL    Calcium 8.9  8.4 - 08.6 mg/dL    GFR calc  non Af Amer >90  >90 mL/min    GFR calc Af Amer >90  >90 mL/min   CBC WITH DIFFERENTIAL     Status: Abnormal   Collection Time   11/18/11  6:58 PM      Component Value Range Comment   WBC 19.2 (*) 4.0 - 10.5 K/uL    RBC 4.09 (*) 4.22 - 5.81 MIL/uL    Hemoglobin 13.5  13.0 - 17.0 g/dL    HCT 57.8  46.9 - 62.9 %    MCV 95.4  78.0 - 100.0 fL    MCH 33.0  26.0 - 34.0 pg    MCHC 34.6  30.0 - 36.0 g/dL    RDW 52.8  41.3 - 24.4 %    Platelets 302  150 - 400 K/uL    Neutrophils Relative 78 (*) 43 - 77 %    Neutro Abs 14.9 (*) 1.7 - 7.7 K/uL    Lymphocytes Relative 16  12 - 46 %    Lymphs  Abs 3.0  0.7 - 4.0 K/uL    Monocytes Relative 6  3 - 12 %    Monocytes Absolute 1.2 (*) 0.1 - 1.0 K/uL    Eosinophils Relative 0  0 - 5 %    Eosinophils Absolute 0.1  0.0 - 0.7 K/uL    Basophils Relative 0  0 - 1 %    Basophils Absolute 0.0  0.0 - 0.1 K/uL   LACTIC ACID, PLASMA     Status: Normal   Collection Time   11/18/11  6:59 PM      Component Value Range Comment   Lactic Acid, Venous 0.8  0.5 - 2.2 mmol/L   CULTURE, BLOOD (ROUTINE X 2)     Status: Normal (Preliminary result)   Collection Time   11/19/11 12:12 AM      Component Value Range Comment   Specimen Description BLOOD LEFT ARM      Special Requests BOTTLES DRAWN AEROBIC AND ANAEROBIC 8CC      Culture NO GROWTH 1 DAY      Report Status PENDING     HEPATIC FUNCTION PANEL     Status: Abnormal   Collection Time   11/19/11 12:18 AM      Component Value Range Comment   Total Protein 7.1  6.0 - 8.3 g/dL    Albumin 2.8 (*) 3.5 - 5.2 g/dL    AST 21  0 - 37 U/L    ALT 31  0 - 53 U/L    Alkaline Phosphatase 90  39 - 117 U/L    Total Bilirubin 0.4  0.3 - 1.2 mg/dL    Bilirubin, Direct 0.1  0.0 - 0.3 mg/dL    Indirect Bilirubin 0.3  0.3 - 0.9 mg/dL   MAGNESIUM     Status: Normal   Collection Time   11/19/11 12:18 AM      Component Value Range Comment   Magnesium 2.4  1.5 - 2.5 mg/dL   PHOSPHORUS     Status: Normal   Collection Time    11/19/11 12:18 AM      Component Value Range Comment   Phosphorus 2.6  2.3 - 4.6 mg/dL   TSH     Status: Normal   Collection Time   11/19/11 12:18 AM      Component Value Range Comment   TSH 0.718  0.350 - 4.500 uIU/mL   HIV ANTIBODY (ROUTINE TESTING)     Status: Normal   Collection Time   11/19/11 12:18 AM      Component Value Range Comment   HIV NON REACTIVE  NON REACTIVE   CULTURE, BLOOD (ROUTINE X 2)     Status: Normal (Preliminary result)   Collection Time   11/19/11 12:19 AM      Component Value Range Comment   Specimen Description BLOOD LEFT HAND      Special Requests BOTTLES DRAWN AEROBIC AND ANAEROBIC 8CC      Culture NO GROWTH 1 DAY      Report Status PENDING     URINALYSIS, ROUTINE W REFLEX MICROSCOPIC     Status: Abnormal   Collection Time   11/19/11  1:58 AM      Component Value Range Comment   Color, Urine YELLOW  YELLOW    APPearance CLEAR  CLEAR    Specific Gravity, Urine >1.030 (*) 1.005 - 1.030    pH 6.0  5.0 - 8.0    Glucose, UA NEGATIVE  NEGATIVE mg/dL    Hgb urine  dipstick MODERATE (*) NEGATIVE    Bilirubin Urine MODERATE (*) NEGATIVE    Ketones, ur 40 (*) NEGATIVE mg/dL    Protein, ur 30 (*) NEGATIVE mg/dL    Urobilinogen, UA 2.0 (*) 0.0 - 1.0 mg/dL    Nitrite NEGATIVE  NEGATIVE    Leukocytes, UA NEGATIVE  NEGATIVE   STREP PNEUMONIAE URINARY ANTIGEN     Status: Normal   Collection Time   11/19/11  1:58 AM      Component Value Range Comment   Strep Pneumo Urinary Antigen NEGATIVE  NEGATIVE   URINE MICROSCOPIC-ADD ON     Status: Normal   Collection Time   11/19/11  1:58 AM      Component Value Range Comment   WBC, UA 0-2  <3 WBC/hpf    RBC / HPF 3-6  <3 RBC/hpf    Bacteria, UA RARE  RARE   CBC     Status: Abnormal   Collection Time   11/19/11  5:28 AM      Component Value Range Comment   WBC 20.2 (*) 4.0 - 10.5 K/uL    RBC 3.99 (*) 4.22 - 5.81 MIL/uL    Hemoglobin 12.8 (*) 13.0 - 17.0 g/dL    HCT 21.3 (*) 08.6 - 52.0 %    MCV 95.7  78.0 -  100.0 fL    MCH 32.1  26.0 - 34.0 pg    MCHC 33.5  30.0 - 36.0 g/dL    RDW 57.8  46.9 - 62.9 %    Platelets 312  150 - 400 K/uL   BASIC METABOLIC PANEL     Status: Abnormal   Collection Time   11/19/11  5:28 AM      Component Value Range Comment   Sodium 133 (*) 135 - 145 mEq/L    Potassium 3.6  3.5 - 5.1 mEq/L    Chloride 95 (*) 96 - 112 mEq/L    CO2 27  19 - 32 mEq/L    Glucose, Bld 155 (*) 70 - 99 mg/dL    BUN 9  6 - 23 mg/dL    Creatinine, Ser 5.28 (*) 0.50 - 1.35 mg/dL    Calcium 8.9  8.4 - 41.3 mg/dL    GFR calc non Af Amer >90  >90 mL/min    GFR calc Af Amer >90  >90 mL/min   GLUCOSE, CAPILLARY     Status: Abnormal   Collection Time   11/19/11  7:42 AM      Component Value Range Comment   Glucose-Capillary 146 (*) 70 - 99 mg/dL    Comment 1 Documented in Chart      Comment 2 Notify RN     CULTURE, EXPECTORATED SPUTUM-ASSESSMENT     Status: Normal   Collection Time   11/19/11  6:30 PM      Component Value Range Comment   Specimen Description SPUTUM EXPECTORATED      Special Requests NONE      Sputum evaluation        Value: THIS SPECIMEN IS ACCEPTABLE. RESPIRATORY CULTURE REPORT TO FOLLOW.     Performed at [facility]   Report Status 11/19/2011 FINAL     CULTURE, RESPIRATORY     Status: Normal (Preliminary result)   Collection Time   11/19/11  6:30 PM      Component Value Range Comment   Specimen Description SPUTUM EXPECTORATED      Special Requests NONE      Gram Stain  Value: ABUNDANT WBC PRESENT,BOTH PMN AND MONONUCLEAR     RARE SQUAMOUS EPITHELIAL CELLS PRESENT     FEW GRAM POSITIVE COCCI     IN PAIRS RARE GRAM VARIABLE ROD   Culture PENDING      Report Status PENDING     GLUCOSE, CAPILLARY     Status: Abnormal   Collection Time   11/20/11  7:36 AM      Component Value Range Comment   Glucose-Capillary 157 (*) 70 - 99 mg/dL     Diagnostics:  Dg Chest 2 View  11/18/2011  *RADIOLOGY REPORT*  Clinical Data: Cough, shortness of breath   CHEST - 2 VIEW  Comparison: None.  Findings: Moderate patchy airspace opacities in the right lower lobe, more confluent in the posterior and medial basal segments. There is mild left perihilar interstitial prominence without confluent infiltrate.  No definite effusion.  Heart size upper limits normal.  Mildly tortuous thoracic aorta.  IMPRESSION:  1.  Right lower lobe pneumonia.  Recommend follow-up to confirm appropriate resolution, exclude underlying lesion.   Original Report Authenticated By: Osa Craver, M.D.    US Abdomen Complete  11/19/2011  *RADIOLOGY REPORT*  Clinical Data:  Clinical concern for ascites.  Evaluate liver.  COMPLETE ABDOMINAL ULTRASOUND  Comparison:  None  Findings:  Gallbladder:  Multiple gallbladder polyps are noted.  No gallstonesor pericholecystic fluid.  There is mild diffuse gallbladder wall thickening.  Common bile duct:  Borderline common bile duct with maximal measurement of 8.7 mm in the porta hepatis and 6.3 mm in the pancreatic head.  Liver:  Overall increased echogenicity with coarse echotexture suggesting fatty infiltration.  There is a small, 1.2 x 1.2 x 1.3 cm echogenic focus in the left lobe which is likely a benign hemangioma.  IVC:  Normal caliber.  Pancreas:  Not well visualized due to overlying bowel gas.  Spleen:  Normal size and echogenicity without focal lesions.  Right Kidney:  12.0 cm in length. Normal renal cortical thickness and echogenicity without focal lesions or hydronephrosis.  Left Kidney:  13.1 cm in length. Normal renal cortical thickness and echogenicity without focal lesions or hydronephrosis.  Abdominal aorta:  Normal caliber.  IMPRESSION:  1.  Gallbladder polyps and minimal gallbladder wall thickening but no gallstones or pericholecystic fluid. 2.  Borderline caliber common bile duct. 3.  Poor visualization of the pancreas. 4.  Normal sonographic appearance of the spleen and both kidneys. 5.  Diffuse fatty infiltration of the liver and  left hepatic lobe hemangioma.   Original Report Authenticated By: P. Loralie Champagne, M.D.     Full Code   Hospital Course: See H&P for complete admission details. The patient is a 61 year old white male smoker in drinker who presented with cough, fevers chills and shortness of breath. His oxygen saturation in the emergency room was 88 percent. He was afebrile. Had a pulse of 100. Respiration 20. Normal blood pressure. He had diffuse rhonchi and rales at the bases. White blood cell count was 19,000. Chest x-ray showed right lower lobe pneumonia. Patient was admitted to the hospitalist service and started on antibiotics for community acquired pneumonia. He was given oxygen. He was counseled to quit smoking and moderate history taking. There was concern about possible ascites. Ultrasound showed fatty liver and a left hepatic lobe hemangioma. Patient's symptoms improved. He had no evidence of alcohol withdrawal. He was able to ambulate. His oxygen saturations were normal by the time of discharge. Total time on the day of  discharge greater than 30 minutes.  Discharge Exam:  Blood pressure 143/68, pulse 72, temperature 99.3 F (37.4 C), temperature source Oral, resp. rate 20, height 5\' 7"  (1.702 m), weight 95.9 kg (211 lb 6.7 oz), SpO2 95.00%.  Lungs: Mild rhonchi no wheeze or rales Cardiovascular regular rate rhythm without murmurs gallops rubs  Signed: Asmaa Tirpak L 11/20/2011, 11:06 AM

## 2011-11-20 NOTE — Progress Notes (Signed)
[facility] SURGICAL UNIT [id number] [place] [address] Phone: [phone] Fax: [phone]  November 20, 2011  Patient: Nichollas Perusse  Date of Birth: [birth year]  Date of Visit: 11/18/2011    To Whom It May Concern:  Luc Shammas was seen and treated in our emergency department on 11/18/2011. Ever Gustafson  may return to work on 11/24/11.  Sincerely,      Crista Curb, M.D.

## 2011-11-20 NOTE — Progress Notes (Signed)
Pt and wife verbalize understanding of d/c instructions, prescriptions and follow up appt. They plan to get PNA vaccine at follow up visit with PCP. Questions answered. Pt d/c via wheelchair by RN. Wife is driving pt home. Sheryn Bison

## 2011-11-22 LAB — CULTURE, RESPIRATORY W GRAM STAIN: Culture: NORMAL

## 2011-11-25 LAB — CULTURE, BLOOD (ROUTINE X 2): Culture: NO GROWTH

## 2013-09-15 DIAGNOSIS — Z794 Long term (current) use of insulin: Secondary | ICD-10-CM | POA: Insufficient documentation

## 2013-09-15 DIAGNOSIS — E11649 Type 2 diabetes mellitus with hypoglycemia without coma: Secondary | ICD-10-CM | POA: Insufficient documentation

## 2013-09-15 DIAGNOSIS — E1169 Type 2 diabetes mellitus with other specified complication: Secondary | ICD-10-CM | POA: Insufficient documentation

## 2014-06-22 DIAGNOSIS — J309 Allergic rhinitis, unspecified: Secondary | ICD-10-CM | POA: Insufficient documentation

## 2016-01-03 DIAGNOSIS — M1991 Primary osteoarthritis, unspecified site: Secondary | ICD-10-CM | POA: Insufficient documentation

## 2016-01-03 DIAGNOSIS — F17210 Nicotine dependence, cigarettes, uncomplicated: Secondary | ICD-10-CM | POA: Insufficient documentation

## 2017-03-18 DIAGNOSIS — E119 Type 2 diabetes mellitus without complications: Secondary | ICD-10-CM | POA: Diagnosis not present

## 2017-03-18 DIAGNOSIS — I10 Essential (primary) hypertension: Secondary | ICD-10-CM | POA: Diagnosis not present

## 2017-03-26 DIAGNOSIS — F17210 Nicotine dependence, cigarettes, uncomplicated: Secondary | ICD-10-CM | POA: Diagnosis not present

## 2017-03-26 DIAGNOSIS — M150 Primary generalized (osteo)arthritis: Secondary | ICD-10-CM | POA: Diagnosis not present

## 2017-03-26 DIAGNOSIS — I10 Essential (primary) hypertension: Secondary | ICD-10-CM | POA: Diagnosis not present

## 2017-03-26 DIAGNOSIS — E1169 Type 2 diabetes mellitus with other specified complication: Secondary | ICD-10-CM | POA: Diagnosis not present

## 2017-03-26 DIAGNOSIS — E785 Hyperlipidemia, unspecified: Secondary | ICD-10-CM | POA: Diagnosis not present

## 2017-09-02 DIAGNOSIS — E1169 Type 2 diabetes mellitus with other specified complication: Secondary | ICD-10-CM | POA: Diagnosis not present

## 2017-09-02 DIAGNOSIS — E785 Hyperlipidemia, unspecified: Secondary | ICD-10-CM | POA: Diagnosis not present

## 2017-09-10 DIAGNOSIS — I10 Essential (primary) hypertension: Secondary | ICD-10-CM | POA: Diagnosis not present

## 2017-09-10 DIAGNOSIS — E669 Obesity, unspecified: Secondary | ICD-10-CM | POA: Diagnosis not present

## 2017-09-10 DIAGNOSIS — E1169 Type 2 diabetes mellitus with other specified complication: Secondary | ICD-10-CM | POA: Diagnosis not present

## 2017-09-10 DIAGNOSIS — M150 Primary generalized (osteo)arthritis: Secondary | ICD-10-CM | POA: Diagnosis not present

## 2017-09-10 DIAGNOSIS — E785 Hyperlipidemia, unspecified: Secondary | ICD-10-CM | POA: Diagnosis not present

## 2018-02-03 DIAGNOSIS — I10 Essential (primary) hypertension: Secondary | ICD-10-CM | POA: Diagnosis not present

## 2018-02-03 DIAGNOSIS — E1169 Type 2 diabetes mellitus with other specified complication: Secondary | ICD-10-CM | POA: Diagnosis not present

## 2018-02-03 DIAGNOSIS — D649 Anemia, unspecified: Secondary | ICD-10-CM | POA: Diagnosis not present

## 2018-02-03 DIAGNOSIS — E785 Hyperlipidemia, unspecified: Secondary | ICD-10-CM | POA: Diagnosis not present

## 2018-02-17 DIAGNOSIS — Z1211 Encounter for screening for malignant neoplasm of colon: Secondary | ICD-10-CM | POA: Diagnosis not present

## 2018-02-25 DIAGNOSIS — M150 Primary generalized (osteo)arthritis: Secondary | ICD-10-CM | POA: Diagnosis not present

## 2018-02-25 DIAGNOSIS — E1169 Type 2 diabetes mellitus with other specified complication: Secondary | ICD-10-CM | POA: Diagnosis not present

## 2018-02-25 DIAGNOSIS — E669 Obesity, unspecified: Secondary | ICD-10-CM | POA: Diagnosis not present

## 2018-02-25 DIAGNOSIS — I10 Essential (primary) hypertension: Secondary | ICD-10-CM | POA: Diagnosis not present

## 2018-02-25 DIAGNOSIS — E785 Hyperlipidemia, unspecified: Secondary | ICD-10-CM | POA: Diagnosis not present

## 2018-08-25 ENCOUNTER — Other Ambulatory Visit: Payer: Self-pay

## 2018-08-26 ENCOUNTER — Ambulatory Visit: Payer: BC Managed Care – PPO | Admitting: Family Medicine

## 2018-08-26 ENCOUNTER — Encounter: Payer: Self-pay | Admitting: Family Medicine

## 2018-08-26 VITALS — BP 128/78 | HR 93 | Temp 97.9°F | Ht 67.0 in | Wt 222.0 lb

## 2018-08-26 DIAGNOSIS — I10 Essential (primary) hypertension: Secondary | ICD-10-CM

## 2018-08-26 DIAGNOSIS — E1169 Type 2 diabetes mellitus with other specified complication: Secondary | ICD-10-CM | POA: Diagnosis not present

## 2018-08-26 DIAGNOSIS — M150 Primary generalized (osteo)arthritis: Secondary | ICD-10-CM

## 2018-08-26 DIAGNOSIS — Z7689 Persons encountering health services in other specified circumstances: Secondary | ICD-10-CM

## 2018-08-26 DIAGNOSIS — K760 Fatty (change of) liver, not elsewhere classified: Secondary | ICD-10-CM | POA: Diagnosis not present

## 2018-08-26 DIAGNOSIS — E785 Hyperlipidemia, unspecified: Secondary | ICD-10-CM | POA: Diagnosis not present

## 2018-08-26 DIAGNOSIS — M159 Polyosteoarthritis, unspecified: Secondary | ICD-10-CM

## 2018-08-26 DIAGNOSIS — E1159 Type 2 diabetes mellitus with other circulatory complications: Secondary | ICD-10-CM | POA: Diagnosis not present

## 2018-08-26 LAB — BAYER DCA HB A1C WAIVED: HB A1C (BAYER DCA - WAIVED): 8.3 % — ABNORMAL HIGH (ref <7.0)

## 2018-08-26 MED ORDER — METFORMIN HCL 1000 MG PO TABS: 1000.0000 mg | ORAL_TABLET | Freq: Two times a day (BID) | ORAL | 3 refills | Status: DC

## 2018-08-26 MED ORDER — GLIPIZIDE 5 MG PO TABS: 5.0000 mg | ORAL_TABLET | Freq: Every day | ORAL | 3 refills | Status: DC

## 2018-08-26 MED ORDER — ATORVASTATIN CALCIUM 40 MG PO TABS: 40.0000 mg | ORAL_TABLET | Freq: Every day | ORAL | 3 refills | Status: DC

## 2018-08-26 MED ORDER — LOSARTAN POTASSIUM 25 MG PO TABS: 25.0000 mg | ORAL_TABLET | Freq: Every day | ORAL | 3 refills | Status: DC

## 2018-08-26 MED ORDER — MELOXICAM 15 MG PO TABS: 15.0000 mg | ORAL_TABLET | Freq: Every day | ORAL | 3 refills | Status: AC

## 2018-08-26 NOTE — Progress Notes (Signed)
Subjective:  Patient ID: George Anthony, male    DOB: [birth year], 68 y.o.   MRN: [id number]  Patient Care Team: Baruch Gouty, FNP as PCP - General (Family Medicine)   Chief Complaint:  Establish Care and Medical Management of Chronic Issues   HPI: George Anthony is a 68 y.o. male presenting on 08/26/2018 for Establish Care and Medical Management of Chronic Issues   1. Encounter to establish care  Pt presents today to establish care. Was going to Dr. Murrell Redden office who has retired.    2. Hyperlipidemia associated with type 2 diabetes mellitus ([place])  Compliant with medications. No associated side effects. Does not diet or exercise.    3. Hypertension associated with type 2 diabetes mellitus ([place])  Compliant with medications. Does not watch diet or exercise. He denies shortness of breath, chest pain, visual disturbances, palpitations, headaches, dizziness, leg swelling, or confusion.  Does not check BP at home.   4. DM type 2 with diabetic dyslipidemia ([place])  Compliant with meds. Last A1C was 6.5 in January 2020. Has been out of glipizide for at least 2 weeks. Does not check blood sugar at home. No polyuria, polydipsia, or polyphagia. No weakness, confusion, or visual changes. Has not had eye exam within last year, will make appointment.    5. Fatty liver  Pt states he is unaware of this diagnosis. Does not watch diet. No abdominal pain, swelling, or tenderness. No icterus. No weakness or confusion.    6. Primary osteoarthritis involving multiple joints  Ongoing for several years. No erythema of joints. Some swelling in knees at times. Well controlled with Mobic.      Relevant past medical, surgical, family, and social history reviewed and updated as indicated.  Allergies and medications reviewed and updated. Date reviewed: Chart in Epic.   Past Medical History:  Diagnosis Date   Diabetes mellitus without complication ([place])    Enlarged heart    Hyperlipidemia    Hypertension       History reviewed. No pertinent surgical history.  Social History   Socioeconomic History   Marital status: Married    Spouse name: charlene   Number of children: 1   Years of education: Not on file   Highest education level: Not on file  Occupational History    Employer: [employer]  Social Needs   Financial resource strain: Not on file   Food insecurity    Worry: Not on file    Inability: Not on file   Transportation needs    Medical: Not on file    Non-medical: Not on file  Tobacco Use   Smoking status: Current Every Day Smoker    Types: Cigarettes   Smokeless tobacco: Never Used  Substance and Sexual Activity   Alcohol use: Yes    Alcohol/week: 3.0 standard drinks    Types: 3 Cans of beer per week    Comment: social    Drug use: No   Sexual activity: Not on file  Lifestyle   Physical activity    Days per week: Not on file    Minutes per session: Not on file   Stress: Not on file  Relationships   Social connections    Talks on phone: Not on file    Gets together: Not on file    Attends religious service: Not on file    Active member of club or organization: Not on file    Attends meetings of clubs or organizations: Not on  file    Relationship status: Not on file   Intimate partner violence    Fear of current or ex partner: Not on file    Emotionally abused: Not on file    Physically abused: Not on file    Forced sexual activity: Not on file  Other Topics Concern   Not on file  Social History Narrative   Not on file    Outpatient Encounter Medications as of 08/26/2018  Medication Sig   atorvastatin (LIPITOR) 40 MG tablet Take 1 tablet (40 mg total) by mouth at bedtime.   glipiZIDE (GLUCOTROL) 5 MG tablet Take 1 tablet (5 mg total) by mouth daily.   losartan (COZAAR) 25 MG tablet Take 1 tablet (25 mg total) by mouth daily.   meloxicam (MOBIC) 15 MG tablet Take 1 tablet (15 mg total) by mouth daily.   metFORMIN (GLUCOPHAGE)  1000 MG tablet Take 1 tablet (1,000 mg total) by mouth 2 (two) times daily with a meal.   [DISCONTINUED] albuterol (PROVENTIL HFA;VENTOLIN HFA) 108 (90 BASE) MCG/ACT inhaler Inhale 2 puffs into the lungs every 4 (four) hours as needed for wheezing or shortness of breath.   [DISCONTINUED] atorvastatin (LIPITOR) 40 MG tablet Take 40 mg by mouth at bedtime.   [DISCONTINUED] dextromethorphan-guaiFENesin (MUCINEX DM) 30-600 MG per 12 hr tablet Take 1 tablet by mouth every 12 (twelve) hours.   [DISCONTINUED] diphenhydramine-acetaminophen (TYLENOL PM) 25-500 MG TABS Take 2 tablets by mouth at bedtime as needed. For pain/sleep   [DISCONTINUED] glipiZIDE (GLUCOTROL) 5 MG tablet Take 5 mg by mouth daily.   [DISCONTINUED] levofloxacin (LEVAQUIN) 750 MG tablet Take 1 tablet (750 mg total) by mouth daily.   [DISCONTINUED] losartan (COZAAR) 25 MG tablet Take 25 mg by mouth daily.   [DISCONTINUED] meloxicam (MOBIC) 15 MG tablet Take 15 mg by mouth daily.   [DISCONTINUED] metFORMIN (GLUCOPHAGE) 1000 MG tablet Take 1,000 mg by mouth 2 (two) times daily with a meal.   No facility-administered encounter medications on file as of 08/26/2018.     No Known Allergies  Review of Systems  Constitutional: Negative for activity change, appetite change, chills, fatigue and fever.  HENT: Negative.   Eyes: Negative.  Negative for photophobia and visual disturbance.  Respiratory: Negative for cough, chest tightness and shortness of breath.   Cardiovascular: Negative for chest pain, palpitations and leg swelling.  Gastrointestinal: Negative for abdominal pain, anal bleeding, blood in stool, constipation, diarrhea, nausea, rectal pain and vomiting.  Endocrine: Negative.  Negative for cold intolerance, heat intolerance, polydipsia and polyphagia.  Genitourinary: Negative for dysuria, flank pain, frequency and urgency.  Musculoskeletal: Positive for arthralgias and joint swelling. Negative for myalgias.  Skin:  Negative.  Negative for color change and pallor.  Allergic/Immunologic: Negative.   Neurological: Negative for dizziness, tremors, seizures, syncope, facial asymmetry, speech difficulty, weakness, light-headedness, numbness and headaches.  Hematological: Negative.   Psychiatric/Behavioral: Negative for confusion, hallucinations, sleep disturbance and suicidal ideas.  All other systems reviewed and are negative.       Objective:  BP 128/78    Pulse 93    Temp 97.9 F (36.6 C) (Oral)    Ht '5\' 7"'  (1.702 m)    Wt 222 lb (100.7 kg)    BMI 34.77 kg/m    Wt Readings from Last 3 Encounters:  08/26/18 222 lb (100.7 kg)  11/18/11 211 lb 6.7 oz (95.9 kg)    Physical Exam Vitals signs and nursing note reviewed.  Constitutional:      General: He  is not in acute distress.    Appearance: Normal appearance. He is well-developed and well-groomed. He is obese. He is not ill-appearing, toxic-appearing or diaphoretic.  HENT:     Head: Normocephalic and atraumatic.     Jaw: There is normal jaw occlusion.     Right Ear: Hearing normal.     Left Ear: Hearing normal.     Nose: Nose normal.     Mouth/Throat:     Lips: Pink.     Mouth: Mucous membranes are moist.     Pharynx: Oropharynx is clear. Uvula midline.  Eyes:     General: Lids are normal.     Extraocular Movements: Extraocular movements intact.     Conjunctiva/sclera: Conjunctivae normal.     Pupils: Pupils are equal, round, and reactive to light.  Neck:     Musculoskeletal: Normal range of motion and neck supple.     Thyroid: No thyroid mass, thyromegaly or thyroid tenderness.     Vascular: No carotid bruit or JVD.     Trachea: Trachea and phonation normal.  Cardiovascular:     Rate and Rhythm: Normal rate and regular rhythm.     Chest Wall: PMI is not displaced.     Pulses: Normal pulses.          Dorsalis pedis pulses are 2+ on the right side and 2+ on the left side.       Posterior tibial pulses are 2+ on the right side and 2+ on  the left side.     Heart sounds: Normal heart sounds. No murmur. No friction rub. No gallop.   Pulmonary:     Effort: Pulmonary effort is normal. No respiratory distress.     Breath sounds: Normal breath sounds. No wheezing.  Abdominal:     General: Abdomen is protuberant. Bowel sounds are normal. There is no distension or abdominal bruit.     Palpations: Abdomen is soft. There is no hepatomegaly or splenomegaly.     Tenderness: There is no abdominal tenderness. There is no right CVA tenderness or left CVA tenderness.     Hernia: No hernia is present.  Musculoskeletal: Normal range of motion.        General: No swelling, tenderness or deformity.     Right lower leg: No edema.     Left lower leg: No edema.  Feet:     Right foot:     Skin integrity: Skin integrity normal.     Left foot:     Skin integrity: Skin integrity normal.  Lymphadenopathy:     Cervical: No cervical adenopathy.  Skin:    General: Skin is warm and dry.     Capillary Refill: Capillary refill takes less than 2 seconds.     Coloration: Skin is not cyanotic, jaundiced or pale.     Findings: No rash.  Neurological:     General: No focal deficit present.     Mental Status: He is alert and oriented to person, place, and time.     Cranial Nerves: Cranial nerves are intact.     Sensory: Sensation is intact.     Motor: Motor function is intact.     Coordination: Coordination is intact.     Gait: Gait is intact.     Deep Tendon Reflexes: Reflexes are normal and symmetric.  Psychiatric:        Attention and Perception: Attention and perception normal.        Mood and Affect: Mood and affect normal.  Speech: Speech normal.        Behavior: Behavior normal. Behavior is cooperative.        Thought Content: Thought content normal.        Cognition and Memory: Cognition and memory normal.        Judgment: Judgment normal.     Results for orders placed or performed during the hospital encounter of 11/18/11    Culture, blood (routine x 2) Call MD if unable to obtain prior to antibiotics being given   Specimen: BLOOD LEFT ARM  Result Value Ref Range   Specimen Description BLOOD LEFT ARM    Special Requests BOTTLES DRAWN AEROBIC AND ANAEROBIC 8CC    Culture NO GROWTH 6 DAYS    Report Status 11/25/2011 FINAL   Culture, blood (routine x 2) Call MD if unable to obtain prior to antibiotics being given   Specimen: BLOOD LEFT HAND  Result Value Ref Range   Specimen Description BLOOD LEFT HAND    Special Requests BOTTLES DRAWN AEROBIC AND ANAEROBIC 8CC    Culture NO GROWTH 6 DAYS    Report Status 11/25/2011 FINAL   Culture, sputum-assessment   Specimen: Sputum  Result Value Ref Range   Specimen Description SPUTUM EXPECTORATED    Special Requests NONE    Sputum evaluation      THIS SPECIMEN IS ACCEPTABLE. RESPIRATORY CULTURE REPORT TO FOLLOW. Performed at [facility]   Report Status 11/19/2011 FINAL   Culture, respiratory   Specimen: SPU  Result Value Ref Range   Specimen Description SPUTUM EXPECTORATED    Special Requests NONE    Gram Stain      ABUNDANT WBC PRESENT,BOTH PMN AND MONONUCLEAR RARE SQUAMOUS EPITHELIAL CELLS PRESENT FEW GRAM POSITIVE COCCI IN PAIRS RARE GRAM VARIABLE ROD   Culture NORMAL OROPHARYNGEAL FLORA    Report Status 11/22/2011 FINAL   Basic metabolic panel  Result Value Ref Range   Sodium 130 (L) 135 - 145 mEq/L   Potassium 3.5 3.5 - 5.1 mEq/L   Chloride 90 (L) 96 - 112 mEq/L   CO2 27 19 - 32 mEq/L   Glucose, Bld 152 (H) 70 - 99 mg/dL   BUN 10 6 - 23 mg/dL   Creatinine, Ser 0.47 (L) 0.50 - 1.35 mg/dL   Calcium 8.9 8.4 - 10.5 mg/dL   GFR calc non Af Amer >90 >90 mL/min   GFR calc Af Amer >90 >90 mL/min  CBC with Differential  Result Value Ref Range   WBC 19.2 (H) 4.0 - 10.5 K/uL   RBC 4.09 (L) 4.22 - 5.81 MIL/uL   Hemoglobin 13.5 13.0 - 17.0 g/dL   HCT 39.0 39.0 - 52.0 %   MCV 95.4 78.0 - 100.0 fL   MCH 33.0 26.0 - 34.0 pg   MCHC 34.6 30.0 - 36.0  g/dL   RDW 13.0 11.5 - 15.5 %   Platelets 302 150 - 400 K/uL   Neutrophils Relative % 78 (H) 43 - 77 %   Neutro Abs 14.9 (H) 1.7 - 7.7 K/uL   Lymphocytes Relative 16 12 - 46 %   Lymphs Abs 3.0 0.7 - 4.0 K/uL   Monocytes Relative 6 3 - 12 %   Monocytes Absolute 1.2 (H) 0.1 - 1.0 K/uL   Eosinophils Relative 0 0 - 5 %   Eosinophils Absolute 0.1 0.0 - 0.7 K/uL   Basophils Relative 0 0 - 1 %   Basophils Absolute 0.0 0.0 - 0.1 K/uL  Lactic acid, plasma  Result  Value Ref Range   Lactic Acid, Venous 0.8 0.5 - 2.2 mmol/L  Hepatic function panel  Result Value Ref Range   Total Protein 7.1 6.0 - 8.3 g/dL   Albumin 2.8 (L) 3.5 - 5.2 g/dL   AST 21 0 - 37 U/L   ALT 31 0 - 53 U/L   Alkaline Phosphatase 90 39 - 117 U/L   Total Bilirubin 0.4 0.3 - 1.2 mg/dL   Bilirubin, Direct 0.1 0.0 - 0.3 mg/dL   Indirect Bilirubin 0.3 0.3 - 0.9 mg/dL  Magnesium  Result Value Ref Range   Magnesium 2.4 1.5 - 2.5 mg/dL  Phosphorus  Result Value Ref Range   Phosphorus 2.6 2.3 - 4.6 mg/dL  TSH  Result Value Ref Range   TSH 0.718 0.350 - 4.500 uIU/mL  Urinalysis, Routine w reflex microscopic  Result Value Ref Range   Color, Urine YELLOW YELLOW   APPearance CLEAR CLEAR   Specific Gravity, Urine >1.030 (H) 1.005 - 1.030   pH 6.0 5.0 - 8.0   Glucose, UA NEGATIVE NEGATIVE mg/dL   Hgb urine dipstick MODERATE (A) NEGATIVE   Bilirubin Urine MODERATE (A) NEGATIVE   Ketones, ur 40 (A) NEGATIVE mg/dL   Protein, ur 30 (A) NEGATIVE mg/dL   Urobilinogen, UA 2.0 (H) 0.0 - 1.0 mg/dL   Nitrite NEGATIVE NEGATIVE   Leukocytes, UA NEGATIVE NEGATIVE  CBC  Result Value Ref Range   WBC 20.2 (H) 4.0 - 10.5 K/uL   RBC 3.99 (L) 4.22 - 5.81 MIL/uL   Hemoglobin 12.8 (L) 13.0 - 17.0 g/dL   HCT 38.2 (L) 39.0 - 52.0 %   MCV 95.7 78.0 - 100.0 fL   MCH 32.1 26.0 - 34.0 pg   MCHC 33.5 30.0 - 36.0 g/dL   RDW 13.0 11.5 - 15.5 %   Platelets 312 150 - 400 K/uL  Basic metabolic panel  Result Value Ref Range   Sodium 133 (L) 135  - 145 mEq/L   Potassium 3.6 3.5 - 5.1 mEq/L   Chloride 95 (L) 96 - 112 mEq/L   CO2 27 19 - 32 mEq/L   Glucose, Bld 155 (H) 70 - 99 mg/dL   BUN 9 6 - 23 mg/dL   Creatinine, Ser 0.47 (L) 0.50 - 1.35 mg/dL   Calcium 8.9 8.4 - 10.5 mg/dL   GFR calc non Af Amer >90 >90 mL/min   GFR calc Af Amer >90 >90 mL/min  HIV antibody  Result Value Ref Range   HIV NON REACTIVE NON REACTIVE  Legionella antigen, urine  Result Value Ref Range   Specimen Description URINE, CLEAN CATCH    Special Requests NONE    Legionella Antigen, Urine Negative for Legionella pneumophilia serogroup 1    Report Status 11/20/2011 FINAL   Strep pneumoniae urinary antigen  Result Value Ref Range   Strep Pneumo Urinary Antigen NEGATIVE NEGATIVE  Urine microscopic-add on  Result Value Ref Range   WBC, UA 0-2 <3 WBC/hpf   RBC / HPF 3-6 <3 RBC/hpf   Bacteria, UA RARE RARE  Glucose, capillary  Result Value Ref Range   Glucose-Capillary 146 (H) 70 - 99 mg/dL   Comment 1 Documented in Chart    Comment 2 Notify RN   Glucose, capillary  Result Value Ref Range   Glucose-Capillary 157 (H) 70 - 99 mg/dL       Pertinent labs & imaging results that were available during my care of the patient were reviewed by me and considered in my medical  decision making.  Assessment & Plan:  Diagnoses and all orders for this visit:  Encounter to establish care Health maintenance discussed. Not interested in colonoscopy at this time. Will discuss at next appointment.  -     Bayer DCA Hb A1c Waived -     CMP14+EGFR -     CBC with Differential/Platelet -     Lipid panel -     Thyroid Panel With TSH -     Microalbumin / creatinine urine ratio  Hyperlipidemia associated with type 2 diabetes mellitus ([place]) Diet and exercise encouraged. Labs pending. Continue below.  -     atorvastatin (LIPITOR) 40 MG tablet; Take 1 tablet (40 mg total) by mouth at bedtime. -     Lipid panel  Hypertension associated with type 2 diabetes mellitus  ([place]) DASH diet encouraged. Labs pending. Exercise encouraged. Continue below.  -     losartan (COZAAR) 25 MG tablet; Take 1 tablet (25 mg total) by mouth daily. -     CMP14+EGFR -     CBC with Differential/Platelet -     Thyroid Panel With TSH -     Microalbumin / creatinine urine ratio  DM type 2 with diabetic dyslipidemia ([facility]) A1C 8. Today. Has been out of glipizide and was well controlled on regimen. Will recheck in 3 months, will restart glipizide today. Diet and exercise encouraged.  -     glipiZIDE (GLUCOTROL) 5 MG tablet; Take 1 tablet (5 mg total) by mouth daily. -     metFORMIN (GLUCOPHAGE) 1000 MG tablet; Take 1 tablet (1,000 mg total) by mouth 2 (two) times daily with a meal. -     CMP14+EGFR -     Thyroid Panel With TSH -     Microalbumin / creatinine urine ratio  Fatty liver Will check labs today.  -     CMP14+EGFR  Primary osteoarthritis involving multiple joints Stay active, symptomatic care discussed. Continue below.  -     meloxicam (MOBIC) 15 MG tablet; Take 1 tablet (15 mg total) by mouth daily.     Continue all other maintenance medications.  Follow up plan: Return in about 3 months (around 11/26/2018), or if symptoms worsen or fail to improve, for DM.  Continue healthy lifestyle choices, including diet (rich in fruits, vegetables, and lean proteins, and low in salt and simple carbohydrates) and exercise (at least 30 minutes of moderate physical activity daily).  Educational handout given for DM  The above assessment and management plan was discussed with the patient. The patient verbalized understanding of and has agreed to the management plan. Patient is aware to call the clinic if symptoms persist or worsen. Patient is aware when to return to the clinic for a follow-up visit. Patient educated on when it is appropriate to go to the emergency department.   Monia Pouch, FNP-C [facility] [phone] 08/26/18

## 2018-08-26 NOTE — Patient Instructions (Signed)

## 2018-08-27 LAB — MICROALBUMIN / CREATININE URINE RATIO
Creatinine, Urine: 107.9 mg/dL
Microalb/Creat Ratio: 42 mg/g creat — ABNORMAL HIGH (ref 0–29)
Microalbumin, Urine: 44.9 ug/mL

## 2018-08-27 LAB — LIPID PANEL
Chol/HDL Ratio: 3 ratio (ref 0.0–5.0)
Cholesterol, Total: 143 mg/dL (ref 100–199)
HDL: 48 mg/dL (ref >39)
LDL Calculated: 79 mg/dL (ref 0–99)
Triglycerides: 80 mg/dL (ref 0–149)
VLDL Cholesterol Cal: 16 mg/dL (ref 5–40)

## 2018-08-27 LAB — CMP14+EGFR
ALT: 22 IU/L (ref 0–44)
AST: 12 IU/L (ref 0–40)
Albumin/Globulin Ratio: 1.3 (ref 1.2–2.2)
Albumin: 3.9 g/dL (ref 3.8–4.8)
Alkaline Phosphatase: 118 IU/L — ABNORMAL HIGH (ref 39–117)
BUN/Creatinine Ratio: 14 (ref 10–24)
BUN: 9 mg/dL (ref 8–27)
Bilirubin Total: <0.2 mg/dL (ref 0.0–1.2)
CO2: 26 mmol/L (ref 20–29)
Calcium: 9.1 mg/dL (ref 8.6–10.2)
Chloride: 98 mmol/L (ref 96–106)
Creatinine, Ser: 0.65 mg/dL — ABNORMAL LOW (ref 0.76–1.27)
GFR calc Af Amer: 116 mL/min/{1.73_m2} (ref >59)
GFR calc non Af Amer: 101 mL/min/{1.73_m2} (ref >59)
Globulin, Total: 3 g/dL (ref 1.5–4.5)
Glucose: 141 mg/dL — ABNORMAL HIGH (ref 65–99)
Potassium: 4.5 mmol/L (ref 3.5–5.2)
Sodium: 138 mmol/L (ref 134–144)
Total Protein: 6.9 g/dL (ref 6.0–8.5)

## 2018-08-27 LAB — CBC WITH DIFFERENTIAL/PLATELET
Basophils Absolute: 0.1 10*3/uL (ref 0.0–0.2)
Basos: 0 %
EOS (ABSOLUTE): 0.4 10*3/uL (ref 0.0–0.4)
Eos: 2 %
Hematocrit: 35 % — ABNORMAL LOW (ref 37.5–51.0)
Hemoglobin: 11.3 g/dL — ABNORMAL LOW (ref 13.0–17.7)
Immature Grans (Abs): 0.1 10*3/uL (ref 0.0–0.1)
Immature Granulocytes: 1 %
Lymphocytes Absolute: 8.2 10*3/uL — ABNORMAL HIGH (ref 0.7–3.1)
Lymphs: 47 %
MCH: 27.2 pg (ref 26.6–33.0)
MCHC: 32.3 g/dL (ref 31.5–35.7)
MCV: 84 fL (ref 79–97)
Monocytes Absolute: 1.5 10*3/uL — ABNORMAL HIGH (ref 0.1–0.9)
Monocytes: 9 %
Neutrophils Absolute: 7.1 10*3/uL — ABNORMAL HIGH (ref 1.4–7.0)
Neutrophils: 41 %
Platelets: 324 10*3/uL (ref 150–450)
RBC: 4.16 x10E6/uL (ref 4.14–5.80)
RDW: 13.7 % (ref 11.6–15.4)
WBC: 17.3 10*3/uL — ABNORMAL HIGH (ref 3.4–10.8)

## 2018-08-27 LAB — THYROID PANEL WITH TSH
Free Thyroxine Index: 1.4 (ref 1.2–4.9)
T3 Uptake Ratio: 26 % (ref 24–39)
T4, Total: 5.3 ug/dL (ref 4.5–12.0)
TSH: 1.56 u[IU]/mL (ref 0.450–4.500)

## 2018-10-06 ENCOUNTER — Other Ambulatory Visit: Payer: Self-pay

## 2018-10-06 DIAGNOSIS — Z20822 Contact with and (suspected) exposure to covid-19: Secondary | ICD-10-CM

## 2018-10-06 DIAGNOSIS — R6889 Other general symptoms and signs: Secondary | ICD-10-CM | POA: Diagnosis not present

## 2018-10-07 LAB — NOVEL CORONAVIRUS, NAA: SARS-CoV-2, NAA: NOT DETECTED

## 2018-12-23 DIAGNOSIS — Z6832 Body mass index (BMI) 32.0-32.9, adult: Secondary | ICD-10-CM | POA: Diagnosis not present

## 2018-12-23 DIAGNOSIS — E1165 Type 2 diabetes mellitus with hyperglycemia: Secondary | ICD-10-CM | POA: Diagnosis not present

## 2018-12-23 DIAGNOSIS — R531 Weakness: Secondary | ICD-10-CM | POA: Diagnosis not present

## 2018-12-23 DIAGNOSIS — R739 Hyperglycemia, unspecified: Secondary | ICD-10-CM | POA: Diagnosis not present

## 2018-12-30 ENCOUNTER — Ambulatory Visit: Payer: BC Managed Care – PPO | Admitting: Family Medicine

## 2018-12-30 ENCOUNTER — Encounter: Payer: Self-pay | Admitting: Family Medicine

## 2018-12-30 VITALS — BP 125/69 | HR 96 | Temp 98.7°F | Resp 20 | Ht 67.0 in | Wt 205.0 lb

## 2018-12-30 DIAGNOSIS — E1159 Type 2 diabetes mellitus with other circulatory complications: Secondary | ICD-10-CM | POA: Diagnosis not present

## 2018-12-30 DIAGNOSIS — Z1211 Encounter for screening for malignant neoplasm of colon: Secondary | ICD-10-CM | POA: Diagnosis not present

## 2018-12-30 DIAGNOSIS — I10 Essential (primary) hypertension: Secondary | ICD-10-CM

## 2018-12-30 DIAGNOSIS — E785 Hyperlipidemia, unspecified: Secondary | ICD-10-CM | POA: Diagnosis not present

## 2018-12-30 DIAGNOSIS — Z1212 Encounter for screening for malignant neoplasm of rectum: Secondary | ICD-10-CM

## 2018-12-30 DIAGNOSIS — I152 Hypertension secondary to endocrine disorders: Secondary | ICD-10-CM

## 2018-12-30 DIAGNOSIS — E1169 Type 2 diabetes mellitus with other specified complication: Secondary | ICD-10-CM

## 2018-12-30 LAB — BAYER DCA HB A1C WAIVED: HB A1C (BAYER DCA - WAIVED): 13.8 % — ABNORMAL HIGH (ref <7.0)

## 2018-12-30 MED ORDER — ATORVASTATIN CALCIUM 40 MG PO TABS: 40.0000 mg | ORAL_TABLET | Freq: Every day | ORAL | 3 refills | Status: DC

## 2018-12-30 MED ORDER — GLIPIZIDE 5 MG PO TABS: 5.0000 mg | ORAL_TABLET | Freq: Every day | ORAL | 3 refills | Status: DC

## 2018-12-30 MED ORDER — BD PEN NEEDLE MINI U/F 31G X 5 MM MISC: 1 refills | Status: DC

## 2018-12-30 MED ORDER — METFORMIN HCL 1000 MG PO TABS: 1000.0000 mg | ORAL_TABLET | Freq: Two times a day (BID) | ORAL | 3 refills | Status: DC

## 2018-12-30 MED ORDER — LOSARTAN POTASSIUM 25 MG PO TABS: 25.0000 mg | ORAL_TABLET | Freq: Every day | ORAL | 3 refills | Status: DC

## 2018-12-30 MED ORDER — LANTUS SOLOSTAR 100 UNIT/ML ~~LOC~~ SOPN: 15.0000 [IU] | PEN_INJECTOR | Freq: Every day | SUBCUTANEOUS | 6 refills | Status: DC

## 2018-12-30 NOTE — Progress Notes (Signed)
Pt aware during visit.

## 2018-12-30 NOTE — Patient Instructions (Signed)

## 2018-12-30 NOTE — Progress Notes (Signed)
Subjective:  Patient ID: George Anthony, male    DOB: 1951/01/04, 68 y.o.   MRN: [id number]  Patient Care Team: Baruch Gouty, FNP as PCP - General (Family Medicine)   Chief Complaint:  Medical Management of Chronic Issues (3 mo ), Diabetes, Hypertension, and Hyperlipidemia   HPI: George Anthony is a 68 y.o. male presenting on 12/30/2018 for Medical Management of Chronic Issues (3 mo ), Diabetes, Hypertension, and Hyperlipidemia   Recently seen at [facility] and placed on Lantus 15 units daily, has been compliant with this and has noticed a drop in blood sugar readings. He is not compliant with diet and exercise. Has been on lantus for 6 days.   Diabetes He presents for his follow-up diabetic visit. He has type 2 diabetes mellitus. His disease course has been worsening. Pertinent negatives for hypoglycemia include no confusion, dizziness, headaches, hunger, mood changes, nervousness/anxiousness, pallor, seizures, sleepiness, speech difficulty, sweats or tremors. Pertinent negatives for diabetes include no blurred vision, no chest pain, no fatigue, no foot paresthesias, no foot ulcerations, no polydipsia, no polyphagia, no polyuria, no visual change, no weakness and no weight loss. There are no hypoglycemic complications. Risk factors for coronary artery disease include dyslipidemia, diabetes mellitus, hypertension, male sex, obesity, sedentary lifestyle and tobacco exposure. Current diabetic treatment includes oral agent (dual therapy) and insulin injections. He is compliant with treatment some of the time. He is following a generally unhealthy diet. When asked about meal planning, he reported none. He never participates in exercise. His breakfast blood glucose range is generally 140-180 mg/dl. His lunch blood glucose range is generally >200 mg/dl. His dinner blood glucose range is generally 180-200 mg/dl. His bedtime blood glucose range is generally 180-200 mg/dl. His overall blood glucose range is 180-200  mg/dl. An ACE inhibitor/angiotensin II receptor blocker is being taken. He does not see a podiatrist.Eye exam is not current.  Hypertension This is a chronic problem. The current episode started more than 1 year ago. The problem has been waxing and waning since onset. The problem is controlled. Pertinent negatives include no anxiety, blurred vision, chest pain, headaches, malaise/fatigue, neck pain, orthopnea, palpitations, peripheral edema, PND, shortness of breath or sweats. Risk factors for coronary artery disease include diabetes mellitus, dyslipidemia, male gender, obesity, sedentary lifestyle and smoking/tobacco exposure. Past treatments include angiotensin blockers. The current treatment provides significant improvement. Compliance problems include exercise and diet.   Hyperlipidemia This is a chronic problem. The current episode started more than 1 year ago. The problem is controlled. Exacerbating diseases include diabetes. Associated symptoms include myalgias. Pertinent negatives include no chest pain, focal sensory loss, focal weakness, leg pain or shortness of breath. Current antihyperlipidemic treatment includes statins. The current treatment provides moderate improvement of lipids. Compliance problems include adherence to diet and adherence to exercise.  Risk factors for coronary artery disease include diabetes mellitus, dyslipidemia, hypertension, male sex, obesity and a sedentary lifestyle.   Relevant past medical, surgical, family, and social history reviewed and updated as indicated.  Allergies and medications reviewed and updated. Date reviewed: Chart in Epic.   Past Medical History:  Diagnosis Date   Diabetes mellitus without complication ([place])    Enlarged heart    Hyperlipidemia    Hypertension     History reviewed. No pertinent surgical history.  Social History   Socioeconomic History   Marital status: Married    Spouse name: charlene   Number of children: 1    Years of education: Not on file  Highest education level: Not on file  Occupational History    Employer: [employer]  Tobacco Use   Smoking status: Current Every Day Smoker    Types: Cigarettes   Smokeless tobacco: Never Used  Substance and Sexual Activity   Alcohol use: Yes    Alcohol/week: 3.0 standard drinks    Types: 3 Cans of beer per week    Comment: social    Drug use: No   Sexual activity: Not on file  Other Topics Concern   Not on file  Social History Narrative   Not on file   Social Determinants of Health   Financial Resource Strain:    Difficulty of Paying Living Expenses: Not on file  Food Insecurity:    Worried About [place] in the Last Year: Not on file   Ran Out of Food in the Last Year: Not on file  Transportation Needs:    Lack of Transportation (Medical): Not on file   Lack of Transportation (Non-Medical): Not on file  Physical Activity:    Days of Exercise per Week: Not on file   Minutes of Exercise per Session: Not on file  Stress:    Feeling of Stress : Not on file  Social Connections:    Frequency of Communication with Friends and Family: Not on file   Frequency of Social Gatherings with Friends and Family: Not on file   Attends Religious Services: Not on file   Active Member of Clubs or Organizations: Not on file   Attends Archivist Meetings: Not on file   Marital Status: Not on file  Intimate Partner Violence:    Fear of Current or Ex-Partner: Not on file   Emotionally Abused: Not on file   Physically Abused: Not on file   Sexually Abused: Not on file    Outpatient Encounter Medications as of 12/30/2018  Medication Sig   atorvastatin (LIPITOR) 40 MG tablet Take 1 tablet (40 mg total) by mouth at bedtime.   B-D UF III MINI PEN NEEDLES 31G X 5 MM MISC SMARTSIG:1 Pre-Filled Pen Syringe SUB-Q Every Night   glipiZIDE (GLUCOTROL) 5 MG tablet Take 1 tablet (5 mg total) by mouth daily.    LANTUS SOLOSTAR 100 UNIT/ML Solostar Pen Inject 15 Units into the skin daily.   losartan (COZAAR) 25 MG tablet Take 1 tablet (25 mg total) by mouth daily.   meloxicam (MOBIC) 15 MG tablet Take 1 tablet by mouth daily.   metFORMIN (GLUCOPHAGE) 1000 MG tablet Take 1 tablet (1,000 mg total) by mouth 2 (two) times daily with a meal.   No facility-administered encounter medications on file as of 12/30/2018.    No Known Allergies  Review of Systems  Constitutional: Negative for activity change, appetite change, chills, diaphoresis, fatigue, fever, malaise/fatigue, unexpected weight change and weight loss.  HENT: Negative.   Eyes: Negative.  Negative for blurred vision, photophobia and visual disturbance.  Respiratory: Negative for cough, chest tightness and shortness of breath.   Cardiovascular: Negative for chest pain, palpitations, orthopnea, leg swelling and PND.  Gastrointestinal: Negative for abdominal pain, blood in stool, constipation, diarrhea, nausea and vomiting.  Endocrine: Negative.  Negative for polydipsia, polyphagia and polyuria.  Genitourinary: Negative for decreased urine volume, difficulty urinating, dysuria, frequency and urgency.  Musculoskeletal: Positive for arthralgias and myalgias. Negative for neck pain.  Skin: Negative.  Negative for pallor.  Allergic/Immunologic: Negative.   Neurological: Negative for dizziness, tremors, focal weakness, seizures, syncope, facial asymmetry, speech difficulty, weakness, light-headedness,  numbness and headaches.  Hematological: Negative.   Psychiatric/Behavioral: Negative for confusion, hallucinations, sleep disturbance and suicidal ideas. The patient is not nervous/anxious.   All other systems reviewed and are negative.       Objective:  BP 125/69    Pulse 96    Temp 98.7 F (37.1 C)    Resp 20    Ht 5' 7"  (1.702 m)    Wt 205 lb (93 kg)    SpO2 99%    BMI 32.11 kg/m    Wt Readings from Last 3 Encounters:  12/30/18 205 lb (93  kg)  08/26/18 222 lb (100.7 kg)  11/18/11 211 lb 6.7 oz (95.9 kg)    Physical Exam Vitals and nursing note reviewed.  Constitutional:      General: He is not in acute distress.    Appearance: Normal appearance. He is well-developed and well-groomed. He is not ill-appearing, toxic-appearing or diaphoretic.  HENT:     Head: Normocephalic and atraumatic.     Jaw: There is normal jaw occlusion.     Right Ear: Hearing normal.     Left Ear: Hearing normal.     Nose: Nose normal.     Mouth/Throat:     Lips: Pink.     Mouth: Mucous membranes are moist.     Pharynx: Oropharynx is clear. Uvula midline.  Eyes:     General: Lids are normal.     Extraocular Movements: Extraocular movements intact.     Conjunctiva/sclera: Conjunctivae normal.     Pupils: Pupils are equal, round, and reactive to light.  Neck:     Thyroid: No thyroid mass, thyromegaly or thyroid tenderness.     Vascular: No carotid bruit or JVD.     Trachea: Trachea and phonation normal.  Cardiovascular:     Rate and Rhythm: Normal rate and regular rhythm.     Chest Wall: PMI is not displaced.     Pulses: Normal pulses.     Heart sounds: Normal heart sounds. No murmur. No friction rub. No gallop.   Pulmonary:     Effort: Pulmonary effort is normal. No respiratory distress.     Breath sounds: Normal breath sounds. No wheezing.  Abdominal:     General: Bowel sounds are normal. There is no distension or abdominal bruit.     Palpations: Abdomen is soft. There is no hepatomegaly or splenomegaly.     Tenderness: There is no abdominal tenderness. There is no right CVA tenderness or left CVA tenderness.     Hernia: No hernia is present.  Musculoskeletal:        General: Normal range of motion.     Cervical back: Normal range of motion and neck supple.     Right lower leg: No edema.     Left lower leg: No edema.  Lymphadenopathy:     Cervical: No cervical adenopathy.  Skin:    General: Skin is warm and dry.     Capillary  Refill: Capillary refill takes less than 2 seconds.     Coloration: Skin is not cyanotic, jaundiced or pale.     Findings: No rash.  Neurological:     General: No focal deficit present.     Mental Status: He is alert and oriented to person, place, and time.     Cranial Nerves: Cranial nerves are intact. No cranial nerve deficit.     Sensory: Sensation is intact. No sensory deficit.     Motor: Motor function is intact. No weakness.  Coordination: Coordination is intact. Coordination normal.     Gait: Gait is intact. Gait normal.     Deep Tendon Reflexes: Reflexes are normal and symmetric. Reflexes normal.  Psychiatric:        Attention and Perception: Attention and perception normal.        Mood and Affect: Mood and affect normal.        Speech: Speech normal.        Behavior: Behavior normal. Behavior is cooperative.        Thought Content: Thought content normal.        Cognition and Memory: Cognition and memory normal.        Judgment: Judgment normal.     Results for orders placed or performed in visit on 10/06/18  Novel Coronavirus, NAA ([facility])   Specimen: Oropharyngeal(OP) collection in vial transport medium   OROPHARYNGEA  TESTING  Result Value Ref Range   SARS-CoV-2, NAA Not Detected Not Detected       Pertinent labs & imaging results that were available during my care of the patient were reviewed by me and considered in my medical decision making.  Assessment & Plan:  Karanvir was seen today for medical management of chronic issues, diabetes, hypertension and hyperlipidemia.  Diagnoses and all orders for this visit:  Hyperlipidemia associated with type 2 diabetes mellitus ([place]) Diet encouraged - increase intake of fresh fruits and vegetables, increase intake of lean proteins. Bake, broil, or grill foods. Avoid fried, greasy, and fatty foods. Avoid fast foods. Increase intake of fiber-rich whole grains. Exercise encouraged - at least 150 minutes per week and advance as  tolerated.  Goal BMI < 25. Continue medications as prescribed. Follow up in 3-6 months as discussed.  -     CBC with Differential/Platelet -     Lipid panel  Hypertension associated with type 2 diabetes mellitus (HCC) BP well controlled. Changes were not made in regimen today. Goal BP is 130/80. Pt aware to report any persistent high or low readings. DASH diet and exercise encouraged. Exercise at least 150 minutes per week and increase as tolerated. Goal BMI > 25. Stress management encouraged. Avoid nicotine and tobacco product use. Avoid excessive alcohol and NSAID's. Avoid more than 2000 mg of sodium daily. Medications as prescribed. Follow up as scheduled.  -     CBC with Differential/Platelet -     CMP14+EGFR  DM type 2 with diabetic dyslipidemia (HCC) A1C 13.8 today. Has been on lantus for 6 days, will continue and repeat A1C in 3 months. Diet and exercise along with medication compliance discussed in detail. Follow up in 6 months.  -     CBC with Differential/Platelet -     Bayer DCA Hb A1c Waived  Morbid obesity ([place]) Diet and exercise encouraged. Labs pending.  -     CBC with Differential/Platelet -     CMP14+EGFR -     Lipid panel  Screening for colorectal cancer -     Cologuard   Total time spent with patient 40 minutes.  Greater than 50% of encounter spent in coordination of care/counseling.  Continue all other maintenance medications.  Follow up plan: Return in about 3 months (around 03/30/2019), or if symptoms worsen or fail to improve, for DM.  Continue healthy lifestyle choices, including diet (rich in fruits, vegetables, and lean proteins, and low in salt and simple carbohydrates) and exercise (at least 30 minutes of moderate physical activity daily).  Educational handout given for DM  The  above assessment and management plan was discussed with the patient. The patient verbalized understanding of and has agreed to the management plan. Patient is aware to call the  clinic if they develop any new symptoms or if symptoms persist or worsen. Patient is aware when to return to the clinic for a follow-up visit. Patient educated on when it is appropriate to go to the emergency department.   Monia Pouch, FNP-C [facility] [phone]

## 2018-12-31 LAB — CMP14+EGFR
ALT: 18 IU/L (ref 0–44)
AST: 12 IU/L (ref 0–40)
Albumin/Globulin Ratio: 1.2 (ref 1.2–2.2)
Albumin: 3.7 g/dL — ABNORMAL LOW (ref 3.8–4.8)
Alkaline Phosphatase: 125 IU/L — ABNORMAL HIGH (ref 39–117)
BUN/Creatinine Ratio: 17 (ref 10–24)
BUN: 10 mg/dL (ref 8–27)
Bilirubin Total: 0.3 mg/dL (ref 0.0–1.2)
CO2: 26 mmol/L (ref 20–29)
Calcium: 10.3 mg/dL — ABNORMAL HIGH (ref 8.6–10.2)
Chloride: 92 mmol/L — ABNORMAL LOW (ref 96–106)
Creatinine, Ser: 0.6 mg/dL — ABNORMAL LOW (ref 0.76–1.27)
GFR calc Af Amer: 120 mL/min/{1.73_m2} (ref >59)
GFR calc non Af Amer: 104 mL/min/{1.73_m2} (ref >59)
Globulin, Total: 3 g/dL (ref 1.5–4.5)
Glucose: 236 mg/dL — ABNORMAL HIGH (ref 65–99)
Potassium: 4.5 mmol/L (ref 3.5–5.2)
Sodium: 133 mmol/L — ABNORMAL LOW (ref 134–144)
Total Protein: 6.7 g/dL (ref 6.0–8.5)

## 2018-12-31 LAB — CBC WITH DIFFERENTIAL/PLATELET
Basophils Absolute: 0 10*3/uL (ref 0.0–0.2)
Basos: 0 %
EOS (ABSOLUTE): 0.2 10*3/uL (ref 0.0–0.4)
Eos: 1 %
Hematocrit: 34.2 % — ABNORMAL LOW (ref 37.5–51.0)
Hemoglobin: 11 g/dL — ABNORMAL LOW (ref 13.0–17.7)
Immature Grans (Abs): 0.1 10*3/uL (ref 0.0–0.1)
Immature Granulocytes: 1 %
Lymphocytes Absolute: 10.4 10*3/uL — ABNORMAL HIGH (ref 0.7–3.1)
Lymphs: 55 %
MCH: 27.2 pg (ref 26.6–33.0)
MCHC: 32.2 g/dL (ref 31.5–35.7)
MCV: 85 fL (ref 79–97)
Monocytes Absolute: 0.7 10*3/uL (ref 0.1–0.9)
Monocytes: 4 %
Neutrophils Absolute: 7.3 10*3/uL — ABNORMAL HIGH (ref 1.4–7.0)
Neutrophils: 39 %
Platelets: 406 10*3/uL (ref 150–450)
RBC: 4.04 x10E6/uL — ABNORMAL LOW (ref 4.14–5.80)
RDW: 14.4 % (ref 11.6–15.4)
WBC: 18.8 10*3/uL — ABNORMAL HIGH (ref 3.4–10.8)

## 2018-12-31 LAB — LIPID PANEL
Chol/HDL Ratio: 2.7 ratio (ref 0.0–5.0)
Cholesterol, Total: 126 mg/dL (ref 100–199)
HDL: 47 mg/dL (ref >39)
LDL Chol Calc (NIH): 61 mg/dL (ref 0–99)
Triglycerides: 92 mg/dL (ref 0–149)
VLDL Cholesterol Cal: 18 mg/dL (ref 5–40)

## 2018-12-31 NOTE — Progress Notes (Signed)
Cholesterol is normal.  Hgb and Hct remain low with an elevated WBC. I will place referral to hematology for ongoing leukocytosis.  Blood sugar was very elevated at 236, make sure to take medications as prescribed and to follow a diabetic diet. Avoid alcohol.  Alk phos is elevated, we will recheck this when you are fasting.  Follow up as scheduled.

## 2019-01-31 ENCOUNTER — Encounter: Payer: Self-pay | Admitting: Family Medicine

## 2019-02-17 ENCOUNTER — Telehealth: Payer: Self-pay | Admitting: Family Medicine

## 2019-02-17 MED ORDER — GLUCOSE BLOOD VI STRP: ORAL_STRIP | 11 refills | Status: DC

## 2019-02-17 NOTE — Telephone Encounter (Signed)
What is the name of the medication? Contour strips  Have you contacted your pharmacy to request a refill? yes  Which pharmacy would you like this sent to? [facility] in [place]   Patient notified that their request is being sent to the clinical staff for review and that they should receive a call once it is complete. If they do not receive a call within 24 hours they can check with their pharmacy or our office.

## 2019-02-17 NOTE — Telephone Encounter (Signed)
I have not received this from the pharmacy

## 2019-02-17 NOTE — Telephone Encounter (Signed)
Sent!

## 2019-04-05 ENCOUNTER — Other Ambulatory Visit: Payer: Self-pay | Admitting: Family Medicine

## 2019-04-05 NOTE — Telephone Encounter (Signed)
  Medication Request  04/05/2019  What is the name of the medication? Meloxicam 15 mg  Have you contacted your pharmacy to request a refill? yes  Which pharmacy would you like this sent to? [facility]   Patient notified that their request is being sent to the clinical staff for review and that they should receive a call once it is complete. If they do not receive a call within 24 hours they can check with their pharmacy or our office.

## 2019-04-05 NOTE — Telephone Encounter (Signed)
Patient needs to be seen to refill Meloxicam.  Appointment scheduled for telephone visit on 04/06/2019 at 8:05 am with [facility].

## 2019-04-06 ENCOUNTER — Ambulatory Visit (INDEPENDENT_AMBULATORY_CARE_PROVIDER_SITE_OTHER): Payer: Self-pay | Admitting: Family Medicine

## 2019-04-06 ENCOUNTER — Telehealth: Payer: Self-pay | Admitting: Family Medicine

## 2019-04-06 NOTE — Progress Notes (Signed)
Attempted to call pt. No answer x 3. Mailbox full and unable to leave message. Pt will need to reschedule appointment.   Monia Pouch, FNP-C

## 2019-04-07 ENCOUNTER — Ambulatory Visit: Payer: BC Managed Care – PPO | Admitting: Family Medicine

## 2019-04-12 ENCOUNTER — Other Ambulatory Visit: Payer: BC Managed Care – PPO

## 2019-04-12 ENCOUNTER — Other Ambulatory Visit: Payer: Self-pay

## 2019-04-12 DIAGNOSIS — D649 Anemia, unspecified: Secondary | ICD-10-CM

## 2019-04-12 DIAGNOSIS — E1159 Type 2 diabetes mellitus with other circulatory complications: Secondary | ICD-10-CM

## 2019-04-12 DIAGNOSIS — E1169 Type 2 diabetes mellitus with other specified complication: Secondary | ICD-10-CM

## 2019-04-12 DIAGNOSIS — E785 Hyperlipidemia, unspecified: Secondary | ICD-10-CM

## 2019-04-12 DIAGNOSIS — I10 Essential (primary) hypertension: Secondary | ICD-10-CM | POA: Diagnosis not present

## 2019-04-12 DIAGNOSIS — F1010 Alcohol abuse, uncomplicated: Secondary | ICD-10-CM

## 2019-04-12 LAB — BAYER DCA HB A1C WAIVED: HB A1C (BAYER DCA - WAIVED): 7.2 % — ABNORMAL HIGH (ref <7.0)

## 2019-04-12 NOTE — Progress Notes (Signed)
A1C has improved greatly from 13.8 to 7.2. Keep up the good work to get this under 7. Diet and exercise along with medications are important. Will repeat in 3 months. If this remains above 7, will adjust medications.

## 2019-04-13 LAB — CMP14+EGFR
ALT: 16 IU/L (ref 0–44)
AST: 11 IU/L (ref 0–40)
Albumin/Globulin Ratio: 1.3 (ref 1.2–2.2)
Albumin: 3.6 g/dL — ABNORMAL LOW (ref 3.8–4.8)
Alkaline Phosphatase: 112 IU/L (ref 39–117)
BUN/Creatinine Ratio: 15 (ref 10–24)
BUN: 9 mg/dL (ref 8–27)
Bilirubin Total: <0.2 mg/dL (ref 0.0–1.2)
CO2: 25 mmol/L (ref 20–29)
Calcium: 8.8 mg/dL (ref 8.6–10.2)
Chloride: 101 mmol/L (ref 96–106)
Creatinine, Ser: 0.6 mg/dL — ABNORMAL LOW (ref 0.76–1.27)
GFR calc Af Amer: 119 mL/min/{1.73_m2} (ref >59)
GFR calc non Af Amer: 103 mL/min/{1.73_m2} (ref >59)
Globulin, Total: 2.8 g/dL (ref 1.5–4.5)
Glucose: 90 mg/dL (ref 65–99)
Potassium: 4.6 mmol/L (ref 3.5–5.2)
Sodium: 139 mmol/L (ref 134–144)
Total Protein: 6.4 g/dL (ref 6.0–8.5)

## 2019-04-13 LAB — CBC WITH DIFFERENTIAL/PLATELET
Basophils Absolute: 0 10*3/uL (ref 0.0–0.2)
Basos: 0 %
EOS (ABSOLUTE): 0.2 10*3/uL (ref 0.0–0.4)
Eos: 1 %
Hematocrit: 31.5 % — ABNORMAL LOW (ref 37.5–51.0)
Hemoglobin: 9.7 g/dL — ABNORMAL LOW (ref 13.0–17.7)
Immature Grans (Abs): 0 10*3/uL (ref 0.0–0.1)
Immature Granulocytes: 0 %
Lymphocytes Absolute: 7.4 10*3/uL — ABNORMAL HIGH (ref 0.7–3.1)
Lymphs: 47 %
MCH: 24.1 pg — ABNORMAL LOW (ref 26.6–33.0)
MCHC: 30.8 g/dL — ABNORMAL LOW (ref 31.5–35.7)
MCV: 78 fL — ABNORMAL LOW (ref 79–97)
Monocytes Absolute: 2 10*3/uL — ABNORMAL HIGH (ref 0.1–0.9)
Monocytes: 12 %
Neutrophils Absolute: 6.3 10*3/uL (ref 1.4–7.0)
Neutrophils: 40 %
Platelets: 391 10*3/uL (ref 150–450)
RBC: 4.02 x10E6/uL — ABNORMAL LOW (ref 4.14–5.80)
RDW: 15.4 % (ref 11.6–15.4)
WBC: 16 10*3/uL — ABNORMAL HIGH (ref 3.4–10.8)

## 2019-04-13 LAB — LIPID PANEL
Chol/HDL Ratio: 2.9 ratio (ref 0.0–5.0)
Cholesterol, Total: 128 mg/dL (ref 100–199)
HDL: 44 mg/dL (ref >39)
LDL Chol Calc (NIH): 69 mg/dL (ref 0–99)
Triglycerides: 72 mg/dL (ref 0–149)
VLDL Cholesterol Cal: 15 mg/dL (ref 5–40)

## 2019-04-14 NOTE — Addendum Note (Signed)
Addended by: Baruch Gouty on: 04/14/2019 12:49 PM   Modules accepted: Orders

## 2019-04-15 ENCOUNTER — Encounter: Payer: Self-pay | Admitting: Family Medicine

## 2019-04-15 ENCOUNTER — Ambulatory Visit: Payer: BC Managed Care – PPO | Admitting: Family Medicine

## 2019-04-15 ENCOUNTER — Other Ambulatory Visit: Payer: Self-pay

## 2019-04-15 VITALS — BP 136/70 | HR 86 | Temp 99.3°F | Resp 20 | Ht 67.0 in | Wt 206.0 lb

## 2019-04-15 DIAGNOSIS — I10 Essential (primary) hypertension: Secondary | ICD-10-CM

## 2019-04-15 DIAGNOSIS — M8949 Other hypertrophic osteoarthropathy, multiple sites: Secondary | ICD-10-CM

## 2019-04-15 DIAGNOSIS — E1159 Type 2 diabetes mellitus with other circulatory complications: Secondary | ICD-10-CM

## 2019-04-15 DIAGNOSIS — D508 Other iron deficiency anemias: Secondary | ICD-10-CM

## 2019-04-15 DIAGNOSIS — E785 Hyperlipidemia, unspecified: Secondary | ICD-10-CM

## 2019-04-15 DIAGNOSIS — F1010 Alcohol abuse, uncomplicated: Secondary | ICD-10-CM | POA: Diagnosis not present

## 2019-04-15 DIAGNOSIS — D649 Anemia, unspecified: Secondary | ICD-10-CM

## 2019-04-15 DIAGNOSIS — M159 Polyosteoarthritis, unspecified: Secondary | ICD-10-CM

## 2019-04-15 DIAGNOSIS — E1169 Type 2 diabetes mellitus with other specified complication: Secondary | ICD-10-CM | POA: Diagnosis not present

## 2019-04-15 MED ORDER — METFORMIN HCL 1000 MG PO TABS: 1000.0000 mg | ORAL_TABLET | Freq: Two times a day (BID) | ORAL | 3 refills | Status: DC

## 2019-04-15 MED ORDER — LOSARTAN POTASSIUM 25 MG PO TABS
25.0000 mg | ORAL_TABLET | Freq: Every day | ORAL | 5 refills | Status: DC
Start: 2019-04-15 — End: 2019-07-12

## 2019-04-15 MED ORDER — ATORVASTATIN CALCIUM 40 MG PO TABS: 40.0000 mg | ORAL_TABLET | Freq: Every day | ORAL | 5 refills | Status: DC

## 2019-04-15 MED ORDER — LANTUS SOLOSTAR 100 UNIT/ML ~~LOC~~ SOPN: 15.0000 [IU] | PEN_INJECTOR | Freq: Every day | SUBCUTANEOUS | 6 refills | Status: DC

## 2019-04-15 MED ORDER — MELOXICAM 15 MG PO TABS: 15.0000 mg | ORAL_TABLET | Freq: Every day | ORAL | 5 refills | Status: AC

## 2019-04-15 MED ORDER — GLIPIZIDE 5 MG PO TABS: 5.0000 mg | ORAL_TABLET | Freq: Every day | ORAL | 5 refills | Status: DC

## 2019-04-15 NOTE — Patient Instructions (Signed)
Anemia  Anemia is a condition in which you do not have enough red blood cells or hemoglobin. Hemoglobin is a substance in red blood cells that carries oxygen. When you do not have enough red blood cells or hemoglobin (are anemic), your body cannot get enough oxygen and your organs may not work properly. As a result, you may feel very tired or have other problems. What are the causes? Common causes of anemia include:  Excessive bleeding. Anemia can be caused by excessive bleeding inside or outside the body, including bleeding from the intestine or from periods in women.  Poor nutrition.  Long-lasting (chronic) kidney, thyroid, and liver disease.  Bone marrow disorders.  Cancer and treatments for cancer.  HIV (human immunodeficiency virus) and AIDS (acquired immunodeficiency syndrome).  Treatments for HIV and AIDS.  Spleen problems.  Blood disorders.  Infections, medicines, and autoimmune disorders that destroy red blood cells. What are the signs or symptoms? Symptoms of this condition include:  Minor weakness.  Dizziness.  Headache.  Feeling heartbeats that are irregular or faster than normal (palpitations).  Shortness of breath, especially with exercise.  Paleness.  Cold sensitivity.  Indigestion.  Nausea.  Difficulty sleeping.  Difficulty concentrating. Symptoms may occur suddenly or develop slowly. If your anemia is mild, you may not have symptoms. How is this diagnosed? This condition is diagnosed based on:  Blood tests.  Your medical history.  A physical exam.  Bone marrow biopsy. Your health care provider may also check your stool (feces) for blood and may do additional testing to look for the cause of your bleeding. You may also have other tests, including:  Imaging tests, such as a CT scan or MRI.  Endoscopy.  Colonoscopy. How is this treated? Treatment for this condition depends on the cause. If you continue to lose a lot of blood, you may  need to be treated at a hospital. Treatment may include:  Taking supplements of iron, vitamin S31, or folic acid.  Taking a hormone medicine (erythropoietin) that can help to stimulate red blood cell growth.  Having a blood transfusion. This may be needed if you lose a lot of blood.  Making changes to your diet.  Having surgery to remove your spleen. Follow these instructions at home:  Take over-the-counter and prescription medicines only as told by your health care provider.  Take supplements only as told by your health care provider.  Follow any diet instructions that you were given.  Keep all follow-up visits as told by your health care provider. This is important. Contact a health care provider if:  You develop new bleeding anywhere in the body. Get help right away if:  You are very weak.  You are short of breath.  You have pain in your abdomen or chest.  You are dizzy or feel faint.  You have trouble concentrating.  You have bloody or black, tarry stools.  You vomit repeatedly or you vomit up blood. Summary  Anemia is a condition in which you do not have enough red blood cells or enough of a substance in your red blood cells that carries oxygen (hemoglobin).  Symptoms may occur suddenly or develop slowly.  If your anemia is mild, you may not have symptoms.  This condition is diagnosed with blood tests as well as a medical history and physical exam. Other tests may be needed.  Treatment for this condition depends on the cause of the anemia. This information is not intended to replace advice given to you by  your health care provider. Make sure you discuss any questions you have with your health care provider. Document Revised: 12/19/2016 Document Reviewed: 02/08/2016 Elsevier Patient Education  [place].

## 2019-04-15 NOTE — Progress Notes (Signed)
Subjective:  Patient ID: George Anthony, male    DOB: 1950/02/16, 69 y.o.   MRN: [id number]  Patient Care Team: Baruch Gouty, FNP as PCP - General (Family Medicine)   Chief Complaint:  Medical Management of Chronic Issues (3 mo ), Diabetes, Hypertension, and Hyperlipidemia   HPI: George Anthony is a 69 y.o. male presenting on 04/15/2019 for Medical Management of Chronic Issues (3 mo ), Diabetes, Hypertension, and Hyperlipidemia   1. Hyperlipidemia associated with type 2 diabetes mellitus ([place]) Compliant with statin therapy and tolerating well. Has made drastic diet changes since last visit. Lipid panel looks good.   2. DM type 2 with diabetic dyslipidemia ([place]) Pt compliant with medications and has made drastic dietary changes since last visit. BS at home with no significant high or low readings. Most readings 85-110. A1C went from 13.8 to 7.2.   3. Hypertension associated with type 2 diabetes mellitus ([place]) Compliant with medications. No headaches, chest pain, shortness of breath, leg swelling, or weakness. No confusion or syncope.   4. Primary osteoarthritis involving multiple joints Ongoing symptoms. No worsening symptoms. Well controlled with mobic.   5. Alcohol abuse Still drinking daily but has cut back to around 3-5 beers per day. Anemia noted on last blood draw. Denies fatigue, weakness, pallor, abnormal bleeding or bruising, rectal bleeding, palpitations, or syncope. Will check anemia profile today along with B 12 and folate as this may be related to alcohol use.    Relevant past medical, surgical, family, and social history reviewed and updated as indicated.  Allergies and medications reviewed and updated. Date reviewed: Chart in Epic.   Past Medical History:  Diagnosis Date  . Diabetes mellitus without complication (George Anthony)   . Enlarged heart   . Hyperlipidemia   . Hypertension     History reviewed. No pertinent surgical history.  Social History   Socioeconomic  History  . Marital status: Married    Spouse name: George Anthony  . Number of children: 1  . Years of education: Not on file  . Highest education level: Not on file  Occupational History    Employer: [employer]  Tobacco Use  . Smoking status: Current Every Day Smoker    Types: Cigarettes  . Smokeless tobacco: Never Used  Substance and Sexual Activity  . Alcohol use: Yes    Alcohol/week: 3.0 standard drinks    Types: 3 Cans of beer per week    Comment: social   . Drug use: No  . Sexual activity: Not on file  Other Topics Concern  . Not on file  Social History Narrative  . Not on file   Social Determinants of Health   Financial Resource Strain:   . Difficulty of Paying Living Expenses:   Food Insecurity:   . Worried About Charity fundraiser in the Last Year:   . Arboriculturist in the Last Year:   Transportation Needs:   . Film/video editor (Medical):   George Anthony Lack of Transportation (Non-Medical):   Physical Activity:   . Days of Exercise per Week:   . Minutes of Exercise per Session:   Stress:   . Feeling of Stress :   Social Connections:   . Frequency of Communication with Friends and Family:   . Frequency of Social Gatherings with Friends and Family:   . Attends Religious Services:   . Active Member of Clubs or Organizations:   . Attends Archivist Meetings:   George Anthony Marital  Status:   Intimate Partner Violence:   . Fear of Current or Ex-Partner:   . Emotionally Abused:   George Anthony Physically Abused:   . Sexually Abused:     Outpatient Encounter Medications as of 04/15/2019  Medication Sig  . atorvastatin (LIPITOR) 40 MG tablet Take 1 tablet (40 mg total) by mouth at bedtime.  . B-D UF III MINI PEN NEEDLES 31G X 5 MM MISC SMARTSIG:1 Pre-Filled Pen Syringe SUB-Q Every Night  . glipiZIDE (GLUCOTROL) 5 MG tablet Take 1 tablet (5 mg total) by mouth daily.  George Anthony glucose blood test strip Check TID and PRN dx E11.9  . LANTUS SOLOSTAR 100 UNIT/ML Solostar Pen Inject 15  Units into the skin daily.  George Anthony losartan (COZAAR) 25 MG tablet Take 1 tablet (25 mg total) by mouth daily.  . meloxicam (MOBIC) 15 MG tablet Take 1 tablet (15 mg total) by mouth daily.  . metFORMIN (GLUCOPHAGE) 1000 MG tablet Take 1 tablet (1,000 mg total) by mouth 2 (two) times daily with a meal.  . [DISCONTINUED] atorvastatin (LIPITOR) 40 MG tablet Take 1 tablet (40 mg total) by mouth at bedtime.  . [DISCONTINUED] glipiZIDE (GLUCOTROL) 5 MG tablet Take 1 tablet (5 mg total) by mouth daily.  . [DISCONTINUED] LANTUS SOLOSTAR 100 UNIT/ML Solostar Pen Inject 15 Units into the skin daily.  . [DISCONTINUED] losartan (COZAAR) 25 MG tablet Take 1 tablet (25 mg total) by mouth daily.  . [DISCONTINUED] meloxicam (MOBIC) 15 MG tablet Take 1 tablet by mouth daily.  . [DISCONTINUED] metFORMIN (GLUCOPHAGE) 1000 MG tablet Take 1 tablet (1,000 mg total) by mouth 2 (two) times daily with a meal.   No facility-administered encounter medications on file as of 04/15/2019.    No Known Allergies  Review of Systems  Constitutional: Negative for activity change, appetite change, chills, diaphoresis, fatigue, fever and unexpected weight change.  HENT: Negative.   Eyes: Negative.  Negative for photophobia and visual disturbance.  Respiratory: Negative for cough, chest tightness and shortness of breath.   Cardiovascular: Negative for chest pain, palpitations and leg swelling.  Gastrointestinal: Negative for abdominal distention, abdominal pain, anal bleeding, blood in stool, constipation, diarrhea, nausea, rectal pain and vomiting.  Endocrine: Negative.  Negative for cold intolerance, heat intolerance, polydipsia, polyphagia and polyuria.  Genitourinary: Negative for decreased urine volume, difficulty urinating, dysuria, frequency, hematuria and urgency.  Musculoskeletal: Positive for arthralgias, back pain, gait problem and joint swelling. Negative for myalgias.  Skin: Negative.  Negative for color change and pallor.   Allergic/Immunologic: Negative.   Neurological: Negative for dizziness, tremors, seizures, syncope, facial asymmetry, speech difficulty, weakness, light-headedness, numbness and headaches.  Hematological: Negative.  Does not bruise/bleed easily.  Psychiatric/Behavioral: Negative for confusion, hallucinations, sleep disturbance and suicidal ideas.  All other systems reviewed and are negative.       Objective:  BP 136/70   Pulse 86   Temp 99.3 F (37.4 C)   Resp 20   Ht 5' 7" (1.702 m)   Wt 206 lb (93.4 kg)   SpO2 99%   BMI 32.26 kg/m    Wt Readings from Last 3 Encounters:  04/15/19 206 lb (93.4 kg)  12/30/18 205 lb (93 kg)  08/26/18 222 lb (100.7 kg)    Physical Exam Vitals and nursing note reviewed.  Constitutional:      General: He is not in acute distress.    Appearance: Normal appearance. He is well-developed and well-groomed. He is obese. He is not ill-appearing, toxic-appearing or diaphoretic.  HENT:  Head: Normocephalic and atraumatic.     Jaw: There is normal jaw occlusion.     Right Ear: Hearing normal.     Left Ear: Hearing normal.     Nose: Nose normal.     Mouth/Throat:     Lips: Pink.     Mouth: Mucous membranes are moist.     Pharynx: Oropharynx is clear. Uvula midline.  Eyes:     General: Lids are normal.     Extraocular Movements: Extraocular movements intact.     Conjunctiva/sclera: Conjunctivae normal.     Pupils: Pupils are equal, round, and reactive to light.  Neck:     Thyroid: No thyroid mass, thyromegaly or thyroid tenderness.     Vascular: No carotid bruit or JVD.     Trachea: Trachea and phonation normal.  Cardiovascular:     Rate and Rhythm: Normal rate and regular rhythm.     Chest Wall: PMI is not displaced.     Pulses: Normal pulses.     Heart sounds: Normal heart sounds. No murmur. No friction rub. No gallop.   Pulmonary:     Effort: Pulmonary effort is normal. No respiratory distress.     Breath sounds: Normal breath sounds.  No wheezing.  Abdominal:     General: Bowel sounds are normal. There is no distension or abdominal bruit.     Palpations: Abdomen is soft. There is no hepatomegaly or splenomegaly.     Tenderness: There is no abdominal tenderness. There is no right CVA tenderness or left CVA tenderness.     Hernia: No hernia is present.  Musculoskeletal:     Cervical back: Normal, normal range of motion and neck supple.     Thoracic back: Decreased range of motion.     Lumbar back: Decreased range of motion.     Right hip: Decreased range of motion.     Left hip: Decreased range of motion.     Right knee: Decreased range of motion.     Left knee: Decreased range of motion.     Right lower leg: Normal. No edema.     Left lower leg: Normal. No edema.  Lymphadenopathy:     Cervical: No cervical adenopathy.  Skin:    General: Skin is warm and dry.     Capillary Refill: Capillary refill takes less than 2 seconds.     Coloration: Skin is not cyanotic, jaundiced or pale.     Findings: No rash.  Neurological:     General: No focal deficit present.     Mental Status: He is alert and oriented to person, place, and time.     Cranial Nerves: Cranial nerves are intact. No cranial nerve deficit.     Sensory: Sensation is intact. No sensory deficit.     Motor: Motor function is intact. No weakness.     Coordination: Coordination is intact. Coordination normal.     Gait: Gait abnormal (antalgic gait).     Deep Tendon Reflexes: Reflexes are normal and symmetric. Reflexes normal.  Psychiatric:        Attention and Perception: Attention and perception normal.        Mood and Affect: Mood and affect normal.        Speech: Speech normal.        Behavior: Behavior normal. Behavior is cooperative.        Thought Content: Thought content normal.        Cognition and Memory: Cognition and memory normal.  Judgment: Judgment normal.     Results for orders placed or performed in visit on 04/12/19  CBC with  Differential/Platelet  Result Value Ref Range   WBC 16.0 (H) 3.4 - 10.8 x10E3/uL   RBC 4.02 (L) 4.14 - 5.80 x10E6/uL   Hemoglobin 9.7 (L) 13.0 - 17.7 g/dL   Hematocrit 31.5 (L) 37.5 - 51.0 %   MCV 78 (L) 79 - 97 fL   MCH 24.1 (L) 26.6 - 33.0 pg   MCHC 30.8 (L) 31.5 - 35.7 g/dL   RDW 15.4 11.6 - 15.4 %   Platelets 391 150 - 450 x10E3/uL   Neutrophils 40 Not Estab. %   Lymphs 47 Not Estab. %   Monocytes 12 Not Estab. %   Eos 1 Not Estab. %   Basos 0 Not Estab. %   Neutrophils Absolute 6.3 1.4 - 7.0 x10E3/uL   Lymphocytes Absolute 7.4 (H) 0.7 - 3.1 x10E3/uL   Monocytes Absolute 2.0 (H) 0.1 - 0.9 x10E3/uL   EOS (ABSOLUTE) 0.2 0.0 - 0.4 x10E3/uL   Basophils Absolute 0.0 0.0 - 0.2 x10E3/uL   Immature Granulocytes 0 Not Estab. %   Immature Grans (Abs) 0.0 0.0 - 0.1 x10E3/uL  CMP14+EGFR  Result Value Ref Range   Glucose 90 65 - 99 mg/dL   BUN 9 8 - 27 mg/dL   Creatinine, Ser 0.60 (L) 0.76 - 1.27 mg/dL   GFR calc non Af Amer 103 >59 mL/min/1.73   GFR calc Af Amer 119 >59 mL/min/1.73   BUN/Creatinine Ratio 15 10 - 24   Sodium 139 134 - 144 mmol/L   Potassium 4.6 3.5 - 5.2 mmol/L   Chloride 101 96 - 106 mmol/L   CO2 25 20 - 29 mmol/L   Calcium 8.8 8.6 - 10.2 mg/dL   Total Protein 6.4 6.0 - 8.5 g/dL   Albumin 3.6 (L) 3.8 - 4.8 g/dL   Globulin, Total 2.8 1.5 - 4.5 g/dL   Albumin/Globulin Ratio 1.3 1.2 - 2.2   Bilirubin Total <0.2 0.0 - 1.2 mg/dL   Alkaline Phosphatase 112 39 - 117 IU/L   AST 11 0 - 40 IU/L   ALT 16 0 - 44 IU/L  Lipid panel  Result Value Ref Range   Cholesterol, Total 128 100 - 199 mg/dL   Triglycerides 72 0 - 149 mg/dL   HDL 44 >39 mg/dL   VLDL Cholesterol Cal 15 5 - 40 mg/dL   LDL Chol Calc (NIH) 69 0 - 99 mg/dL   Chol/HDL Ratio 2.9 0.0 - 5.0 ratio  Bayer DCA Hb A1c Waived  Result Value Ref Range   HB A1C (BAYER DCA - WAIVED) 7.2 (H) <7.0 %       Pertinent labs & imaging results that were available during my care of the patient were reviewed by me and  considered in my medical decision making.  Assessment & Plan:  Zackaria was seen today for medical management of chronic issues, diabetes, hypertension and hyperlipidemia.  Diagnoses and all orders for this visit:  Primary osteoarthritis involving multiple joints Doing well on below, will continue.  -     meloxicam (MOBIC) 15 MG tablet; Take 1 tablet (15 mg total) by mouth daily.  Hyperlipidemia associated with type 2 diabetes mellitus ([place]) Diet encouraged - increase intake of fresh fruits and vegetables, increase intake of lean proteins. Bake, broil, or grill foods. Avoid fried, greasy, and fatty foods. Avoid fast foods. Increase intake of fiber-rich whole grains. Exercise encouraged - at least 150  minutes per week and advance as tolerated.  Goal BMI < 25. Continue medications as prescribed. Follow up in 3-6 months as discussed.  -     atorvastatin (LIPITOR) 40 MG tablet; Take 1 tablet (40 mg total) by mouth at bedtime.  DM type 2 with diabetic dyslipidemia ([facility]) A1C went from 13.8 to 7.2. Pt congratulated on great work and encouraged to keep up good work. Continue below. Follow up in 3 months.  -     glipiZIDE (GLUCOTROL) 5 MG tablet; Take 1 tablet (5 mg total) by mouth daily. -     LANTUS SOLOSTAR 100 UNIT/ML Solostar Pen; Inject 15 Units into the skin daily. -     metFORMIN (GLUCOPHAGE) 1000 MG tablet; Take 1 tablet (1,000 mg total) by mouth 2 (two) times daily with a meal.  Hypertension associated with type 2 diabetes mellitus ([facility]) BP well controlled. Changes were not made in regimen today. Goal BP is 130/80. Pt aware to report any persistent high or low readings. DASH diet and exercise encouraged. Exercise at least 150 minutes per week and increase as tolerated. Goal BMI > 25. Stress management encouraged. Avoid nicotine and tobacco product use. Avoid excessive alcohol and NSAID's. Avoid more than 2000 mg of sodium daily. Medications as prescribed. Follow up as scheduled.  -     losartan  (COZAAR) 25 MG tablet; Take 1 tablet (25 mg total) by mouth daily.  Alcohol abuse Anemia, unspecified type Recent labs revealed a drop in Hgb and Hct. Will check below along with FOBT. Pt denies symptoms. May be due to alcohol use. Treatment pending results.  -     Folate -     Vitamin B12 -     Anemia Profile B     Continue all other maintenance medications.  Follow up plan: Return in about 3 months (around 07/16/2019), or if symptoms worsen or fail to improve, for DM.    Continue healthy lifestyle choices, including diet (rich in fruits, vegetables, and lean proteins, and low in salt and simple carbohydrates) and exercise (at least 30 minutes of moderate physical activity daily).  Educational handout given for anemia  The above assessment and management plan was discussed with the patient. The patient verbalized understanding of and has agreed to the management plan. Patient is aware to call the clinic if they develop any new symptoms or if symptoms persist or worsen. Patient is aware when to return to the clinic for a follow-up visit. Patient educated on when it is appropriate to go to the emergency department.   Monia Pouch, FNP-C [facility] [phone]

## 2019-04-16 LAB — ANEMIA PROFILE B
Basophils Absolute: 0.1 10*3/uL (ref 0.0–0.2)
Basos: 0 %
EOS (ABSOLUTE): 0.3 10*3/uL (ref 0.0–0.4)
Eos: 2 %
Ferritin: 82 ng/mL (ref 30–400)
Folate: 6.5 ng/mL (ref >3.0)
Hematocrit: 32.6 % — ABNORMAL LOW (ref 37.5–51.0)
Hemoglobin: 10.1 g/dL — ABNORMAL LOW (ref 13.0–17.7)
Immature Grans (Abs): 0 10*3/uL (ref 0.0–0.1)
Immature Granulocytes: 0 %
Iron Saturation: 9 % — CL (ref 15–55)
Iron: 25 ug/dL — ABNORMAL LOW (ref 38–169)
Lymphocytes Absolute: 6.4 10*3/uL — ABNORMAL HIGH (ref 0.7–3.1)
Lymphs: 40 %
MCH: 24.6 pg — ABNORMAL LOW (ref 26.6–33.0)
MCHC: 31 g/dL — ABNORMAL LOW (ref 31.5–35.7)
MCV: 80 fL (ref 79–97)
Monocytes Absolute: 2.5 10*3/uL — ABNORMAL HIGH (ref 0.1–0.9)
Monocytes: 16 %
Neutrophils Absolute: 6.7 10*3/uL (ref 1.4–7.0)
Neutrophils: 42 %
Platelets: 416 10*3/uL (ref 150–450)
RBC: 4.1 x10E6/uL — ABNORMAL LOW (ref 4.14–5.80)
RDW: 15.7 % — ABNORMAL HIGH (ref 11.6–15.4)
Retic Ct Pct: 1.9 % (ref 0.6–2.6)
Total Iron Binding Capacity: 277 ug/dL (ref 250–450)
UIBC: 252 ug/dL (ref 111–343)
Vitamin B-12: 475 pg/mL (ref 232–1245)
WBC: 15.9 10*3/uL — ABNORMAL HIGH (ref 3.4–10.8)

## 2019-04-17 MED ORDER — FERROUS SULFATE 300 (60 FE) MG/5ML PO SYRP: 300.0000 mg | ORAL_SOLUTION | Freq: Three times a day (TID) | ORAL | 0 refills | Status: DC

## 2019-04-17 NOTE — Addendum Note (Signed)
Addended by: Baruch Gouty on: 04/17/2019 01:24 PM   Modules accepted: Orders

## 2019-04-18 ENCOUNTER — Other Ambulatory Visit: Payer: Self-pay | Admitting: Family Medicine

## 2019-04-18 ENCOUNTER — Other Ambulatory Visit: Payer: Self-pay | Admitting: *Deleted

## 2019-04-18 ENCOUNTER — Telehealth: Payer: Self-pay | Admitting: Family Medicine

## 2019-04-18 DIAGNOSIS — D508 Other iron deficiency anemias: Secondary | ICD-10-CM

## 2019-04-18 MED ORDER — FERROUS SULFATE 324 (65 FE) MG PO TBEC: 1.0000 | DELAYED_RELEASE_TABLET | Freq: Three times a day (TID) | ORAL | 1 refills | Status: DC

## 2019-04-18 NOTE — Telephone Encounter (Signed)
Refer to lab results.  

## 2019-04-29 ENCOUNTER — Ambulatory Visit: Payer: Self-pay | Admitting: Family Medicine

## 2019-05-05 ENCOUNTER — Other Ambulatory Visit: Payer: Self-pay | Admitting: Family Medicine

## 2019-05-05 DIAGNOSIS — E1169 Type 2 diabetes mellitus with other specified complication: Secondary | ICD-10-CM

## 2019-05-19 ENCOUNTER — Other Ambulatory Visit: Payer: BC Managed Care – PPO

## 2019-05-19 DIAGNOSIS — Z1211 Encounter for screening for malignant neoplasm of colon: Secondary | ICD-10-CM

## 2019-05-19 NOTE — Addendum Note (Signed)
Addended by: Earlene Plater on: 05/19/2019 04:04 PM   Modules accepted: Orders

## 2019-05-20 LAB — FECAL OCCULT BLOOD, IMMUNOCHEMICAL: Fecal Occult Bld: NEGATIVE

## 2019-05-31 ENCOUNTER — Encounter: Payer: Self-pay | Admitting: Family Medicine

## 2019-05-31 LAB — COLOGUARD: Cologuard: POSITIVE — AB

## 2019-06-03 ENCOUNTER — Other Ambulatory Visit: Payer: Self-pay | Admitting: Family Medicine

## 2019-06-03 DIAGNOSIS — Z1211 Encounter for screening for malignant neoplasm of colon: Secondary | ICD-10-CM

## 2019-06-03 DIAGNOSIS — R195 Other fecal abnormalities: Secondary | ICD-10-CM

## 2019-06-06 ENCOUNTER — Encounter: Payer: Self-pay | Admitting: Gastroenterology

## 2019-07-12 ENCOUNTER — Other Ambulatory Visit: Payer: Self-pay | Admitting: *Deleted

## 2019-07-12 DIAGNOSIS — E1159 Type 2 diabetes mellitus with other circulatory complications: Secondary | ICD-10-CM

## 2019-07-12 MED ORDER — LOSARTAN POTASSIUM 25 MG PO TABS: 25.0000 mg | ORAL_TABLET | Freq: Every day | ORAL | 1 refills | Status: DC

## 2019-07-13 NOTE — Progress Notes (Signed)
Referring Provider: Loman Brooklyn, FNP Primary Care Physician:  Loman Brooklyn, FNP Primary Gastroenterologist:  Dr. Gala Romney  Chief Complaint  Patient presents with  . positive cologuard    never had TCS prior, does not notice any blood in stool, no family hx colon cancer/polyps. Stopped taking iron as it made him constipated    HPI:   George Anthony is a 69 y.o. male presenting today at the request of Loman Brooklyn, FNP for positive Cologuard.  Medical history significant for diabetes, HTN, HLD, alcohol abuse, and anemia.   Per chart review, appears patient had mild anemia starting back in October 2013 with hemoglobin 12.8, normocytic indices.  Hemoglobin in 2020 in the 11 range with normocytic indices.  Hemoglobin April 12, 2019 9.7 with microcytic indices.  Follow-up labs with vitamin B12 475, folate 6.5, iron 25 (L), iron saturation 9% (L), ferritin 82.  Creatinine 0.6.  He was started on oral iron.  Cologuard positive 05/19/2019.  Fecal occult blood negative 05/19/2019.  Today:  No prior colonoscopy. No concerns today. No abdominal pain. Has a BM every couple of days, this is his baseline. No diarrhea. Intermittent constipation. Worsened with iron tablets so he stopped taking iron. He was taking 3 iron a day. Has been off iron for at least 1 month. Stools are typically [place] 3. Denies bright red blood per rectum or melena. No known family history of colon cancer or colon polyps but doesn't know much about family history.   No unintentional weight loss. No N/V. No GERD symptoms" unless eating a lot of chili beans". No dysphagia. States he is eating well, eats plenty of meats and vegetables.   Has been taking meloxicam daily for a few years. States he can't remember what he is using it for. Just for his foot about 5 years ago. Has just continued taking it. Occasional ibuprofen for intermittent joint pain. Prefers not to start PPI. Ok with stopping the  Meloxicam.   Prefers  colonoscopy. Doesn't want EGD at this time.   Used to drink quite a bit. Used to drink a couple 12 packs a week.    Past Medical History:  Diagnosis Date  . Diabetes mellitus without complication ([place])   . Enlarged heart   . Hyperlipidemia   . Hypertension     History reviewed. No pertinent surgical history.  Current Outpatient Medications  Medication Sig Dispense Refill  . atorvastatin (LIPITOR) 40 MG tablet Take 1 tablet (40 mg total) by mouth at bedtime. 30 tablet 5  . B-D UF III MINI PEN NEEDLES 31G X 5 MM MISC SMARTSIG:1 Pre-Filled Pen Syringe SUB-Q Every Night 100 each 1  . glipiZIDE (GLUCOTROL) 5 MG tablet Take 1 tablet by mouth once daily 90 tablet 0  . glucose blood test strip Check TID and PRN dx E11.9 100 each 11  . LANTUS SOLOSTAR 100 UNIT/ML Solostar Pen Inject 15 Units into the skin daily. 15 mL 6  . losartan (COZAAR) 25 MG tablet Take 1 tablet (25 mg total) by mouth daily. 30 tablet 1  . meloxicam (MOBIC) 15 MG tablet Take 1 tablet (15 mg total) by mouth daily. 30 tablet 5  . metFORMIN (GLUCOPHAGE) 1000 MG tablet Take 1 tablet (1,000 mg total) by mouth 2 (two) times daily with a meal. 60 tablet 3  . ferrous sulfate 324 (65 Fe) MG TBEC Take 1 tablet (325 mg total) by mouth 3 (three) times daily with meals. (Patient not taking: Reported on 07/14/2019) 270  tablet 1   No current facility-administered medications for this visit.    Allergies as of 07/14/2019  . (No Known Allergies)    Family History  Problem Relation Age of Onset  . Emphysema Mother   . Drug abuse Sister   . Colon cancer Neg Hx     Social History   Socioeconomic History  . Marital status: Married    Spouse name: charlene  . Number of children: 1  . Years of education: Not on file  . Highest education level: Not on file  Occupational History    Employer: [employer]  Tobacco Use  . Smoking status: Current Every Day Smoker    Types: Cigarettes  . Smokeless tobacco: Never Used    Vaping Use  . Vaping Use: Never used  Substance and Sexual Activity  . Alcohol use: Yes    Alcohol/week: 3.0 standard drinks    Types: 3 Cans of beer per week    Comment: drinking a couple times a month for the last year.- used to drink 2 12 packs a week  . Drug use: No  . Sexual activity: Not on file  Other Topics Concern  . Not on file  Social History Narrative  . Not on file   Social Determinants of Health   Financial Resource Strain:   . Difficulty of Paying Living Expenses:   Food Insecurity:   . Worried About Charity fundraiser in the Last Year:   . Arboriculturist in the Last Year:   Transportation Needs:   . Film/video editor (Medical):   [name] Lack of Transportation (Non-Medical):   Physical Activity:   . Days of Exercise per Week:   . Minutes of Exercise per Session:   Stress:   . Feeling of Stress :   Social Connections:   . Frequency of Communication with Friends and Family:   . Frequency of Social Gatherings with Friends and Family:   . Attends Religious Services:   . Active Member of Clubs or Organizations:   . Attends Archivist Meetings:   [name] Marital Status:   Intimate Partner Violence:   . Fear of Current or Ex-Partner:   . Emotionally Abused:   [name] Physically Abused:   . Sexually Abused:     Review of Systems: Gen: Denies any fever, chills, lightheadedness, dizziness, presyncope, syncope CV: Denies chest pain or heart palpitations Resp: Denies shortness of breath or cough GI: See HPI GU : Denies urinary burning, urinary frequency, urinary hesitancy MS: See HPI.  Derm: Denies rash Psych: Denies depression or anxiety Heme: See HPI  Physical Exam: BP (!) 142/78   Pulse 93   Temp (!) 97.5 F (36.4 C)   Ht 5\' 5"  (1.651 m)   Wt 205 lb (93 kg)   BMI 34.11 kg/m  General:   Alert and oriented. Pleasant and cooperative. Well-nourished and well-developed.  Head:  Normocephalic and atraumatic. Eyes:  Without icterus, sclera clear and  conjunctiva pink.  Ears:  Normal auditory acuity. Lungs:  Clear to auscultation bilaterally. No wheezes, rales, or rhonchi. No distress.  Heart:  S1, S2 present without murmurs appreciated.  Abdomen:  +BS, protuberant abdomen, soft, non-tender. No HSM noted. No guarding or rebound. No masses appreciated.  Rectal:  Deferred  Msk:  Symmetrical without gross deformities. Normal posture. Extremities:  Without edema. Neurologic:  Alert and  oriented x4;  grossly normal neurologically. Skin:  Intact without significant lesions or rashes. Psych:  Normal mood  and affect.

## 2019-07-14 ENCOUNTER — Ambulatory Visit: Payer: BC Managed Care – PPO | Admitting: Gastroenterology

## 2019-07-14 ENCOUNTER — Other Ambulatory Visit: Payer: Self-pay

## 2019-07-14 ENCOUNTER — Encounter: Payer: Self-pay | Admitting: Gastroenterology

## 2019-07-14 DIAGNOSIS — R195 Other fecal abnormalities: Secondary | ICD-10-CM

## 2019-07-14 DIAGNOSIS — D649 Anemia, unspecified: Secondary | ICD-10-CM | POA: Insufficient documentation

## 2019-07-14 DIAGNOSIS — D509 Iron deficiency anemia, unspecified: Secondary | ICD-10-CM | POA: Diagnosis not present

## 2019-07-14 DIAGNOSIS — K5900 Constipation, unspecified: Secondary | ICD-10-CM | POA: Insufficient documentation

## 2019-07-14 NOTE — Assessment & Plan Note (Addendum)
Positive Cologuard 05/19/2019.  Fecal occult blood negative.    Also with anemia as discussed below. Denies BRBPR, melena, or unintentional weight loss.  Chronic history of mild constipation as discussed below.  No prior colonoscopy.  No family history of colon cancer or colon polyps although he does not know much about his family history.  Proceed with colonoscopy with propofol with Dr. Gala Romney in the near future. The risks, benefits, and alternatives have been discussed in detail with patient. They have stated understanding and desire to proceed.  Hold iron x7 days prior to procedure. See separate instructions for diabetes medication adjustments. Follow-up after colonoscopy.

## 2019-07-14 NOTE — Assessment & Plan Note (Addendum)
Chronic history of mild constipation.  No BRBPR or melena.  Recently with positive Cologuard on 4/29 as well as mild chronic anemia dating back to 2013 with slight downtrending hemoglobin over the last year.  Most recent hemoglobin 9.7 in March 2021 with complaint of iron deficiency (iron and iron saturation low, ferritin within normal limits).  No prior colonoscopy.  No unintentional weight loss.  Add Benefiber or Metamucil daily. Add MiraLAX 1 capful) 17 g) daily in 8 ounces of water.  May decrease frequency if you develop frequent loose stools. Proceed with colonoscopy with propofol with Dr. Gala Romney in the near future for further evaluation of positive Cologuard and anemia as per below. Follow-up after colonoscopy.

## 2019-07-14 NOTE — Assessment & Plan Note (Addendum)
69 year old male with history of diabetes, HTN, HLD, alcohol abuse (although reports minimal alcohol over the last year), and anemia. Mild anemia in October 2013 with hemoglobin 12.8, normocytic indices.  Hemoglobin in 2020 in the 11 range with normocytic indices.  Hemoglobin 9.7 April 12, 2019 with microcytic indices.  Follow-up labs with vitamin B12 475, folate 6.5, iron 25 (L), iron saturation 9% (L), ferritin 82.  Creatinine 0.6. Positive cologuard on 4/29. Fecal occult blood negative 4/29. No prior colonoscopy or EGD. Denies brbpr or melena. Only with mild constipation, otherwise, no significant lower GI symptoms. No significant upper GI symptoms. Meloxicam daily x 5 years, occasional ibuprofen.   With component of IDA, this could be related to alcohol abuse/malabsorption vs colon polyps, malignancy, AVMs, gastritis, duodenitis, or PUD in the setting of chronic NSAID use. Discussed EGD + TCS or and possible PPI if he were to continue on daily Meloxicam. Patient declined EGD and PPI. States he prefers to stop Meloxicam as he doesn't think he needs it. Is ok with proceeding with TCS.   Update CBC to ensure stability. Resume oral iron daily.  Stop meloxicam and avoid all NSAIDs. Recommend he monitor for bright red blood per rectum or melena and let us know if this occurs. Proceed with TCS with propofol with Dr. Gala Romney in the near future. The risks, benefits, and alternatives have been discussed in detail with patient. They have stated understanding and desire to proceed.  Hold iron x7 days prior to procedure.  See separate instructions for diabetes medication adjustments prior to procedure. Follow-up after colonoscopy.

## 2019-07-14 NOTE — Patient Instructions (Addendum)
Please have blood work completed with your primary care.  We will provide you with a lab slip that you can take to their office.  We will get you scheduled for colonoscopy in the near future with Dr. Gala Romney. You will need to hold iron for 7 days prior to your procedure. 1 day prior to procedure: Take one half dose of glipizide (2.5 mg), one half dose of metformin (500 mg twice daily), one half dose of Lantus (7.5 units). Day of procedure: Do not take any diabetes medications the morning of your procedure.  I recommend you go ahead and resume iron.  You can try taking 1 tablet daily as 3 tablets caaused constipation.  For mild constipation: Add Benefiber or Metamucil daily.  These are fiber supplements. Add MiraLAX 1 capful (17 g) daily in 8 ounces of water.  If you develop frequent loose stools, you can decrease the frequency of MiraLAX.  As we discussed, I recommend you stop meloxicam.  You will also need to avoid all NSAIDs including ibuprofen, Aleve, Advil, Goody powders, aspirin powders, naproxen, and anything that says "NSAID" on the package.  Continue to monitor for any bright red blood per rectum or completely black stools and let me know.  We will plan to see back after your colonoscopy.  Do not hesitate call with questions or concerns prior.  Aliene Altes, PA-C [facility]

## 2019-07-19 ENCOUNTER — Ambulatory Visit: Payer: BC Managed Care – PPO | Admitting: Family Medicine

## 2019-07-19 ENCOUNTER — Encounter: Payer: Self-pay | Admitting: Family Medicine

## 2019-07-28 ENCOUNTER — Telehealth: Payer: Self-pay

## 2019-07-28 NOTE — Telephone Encounter (Signed)
Tried to call pt to schedule TCS w/Prop w/RMR (ASA 2), no answer, LMOVM for return call.

## 2019-08-01 ENCOUNTER — Telehealth: Payer: Self-pay | Admitting: Gastroenterology

## 2019-08-01 ENCOUNTER — Telehealth: Payer: Self-pay | Admitting: Emergency Medicine

## 2019-08-01 NOTE — Telephone Encounter (Signed)
LMOVM

## 2019-08-01 NOTE — Telephone Encounter (Signed)
Pt returned phone call.  

## 2019-08-01 NOTE — Telephone Encounter (Signed)
Patient wife returned call, please call her back [phone]

## 2019-08-02 NOTE — Telephone Encounter (Signed)
Spoke to pt and his wife. TCS w/Prop w/[name] (new pt) (ASA 2) scheduled for 09/16/19 at 7:30am. COVID test 09/15/19. Orders entered. Appt letter and instructions mailed to pt.

## 2019-08-02 NOTE — Telephone Encounter (Signed)
Patient returned call yesterday  Called pt back and spouse advised she will have to call us after 3pm today

## 2019-08-02 NOTE — Telephone Encounter (Signed)
See prior note

## 2019-08-04 ENCOUNTER — Other Ambulatory Visit: Payer: Self-pay | Admitting: Family Medicine

## 2019-08-04 DIAGNOSIS — E785 Hyperlipidemia, unspecified: Secondary | ICD-10-CM

## 2019-08-17 ENCOUNTER — Other Ambulatory Visit: Payer: Self-pay

## 2019-08-17 ENCOUNTER — Other Ambulatory Visit: Payer: BC Managed Care – PPO

## 2019-08-17 DIAGNOSIS — E1169 Type 2 diabetes mellitus with other specified complication: Secondary | ICD-10-CM

## 2019-08-17 DIAGNOSIS — I10 Essential (primary) hypertension: Secondary | ICD-10-CM | POA: Diagnosis not present

## 2019-08-17 DIAGNOSIS — I152 Hypertension secondary to endocrine disorders: Secondary | ICD-10-CM

## 2019-08-17 DIAGNOSIS — E1159 Type 2 diabetes mellitus with other circulatory complications: Secondary | ICD-10-CM | POA: Diagnosis not present

## 2019-08-17 DIAGNOSIS — D509 Iron deficiency anemia, unspecified: Secondary | ICD-10-CM | POA: Diagnosis not present

## 2019-08-17 DIAGNOSIS — E785 Hyperlipidemia, unspecified: Secondary | ICD-10-CM

## 2019-08-18 LAB — CMP14+EGFR
ALT: 18 IU/L (ref 0–44)
AST: 9 IU/L (ref 0–40)
Albumin/Globulin Ratio: 1 — ABNORMAL LOW (ref 1.2–2.2)
Albumin: 3.5 g/dL — ABNORMAL LOW (ref 3.8–4.8)
Alkaline Phosphatase: 122 IU/L — ABNORMAL HIGH (ref 48–121)
BUN/Creatinine Ratio: 14 (ref 10–24)
BUN: 8 mg/dL (ref 8–27)
Bilirubin Total: <0.2 mg/dL (ref 0.0–1.2)
CO2: 22 mmol/L (ref 20–29)
Calcium: 8.6 mg/dL (ref 8.6–10.2)
Chloride: 98 mmol/L (ref 96–106)
Creatinine, Ser: 0.56 mg/dL — ABNORMAL LOW (ref 0.76–1.27)
GFR calc Af Amer: 123 mL/min/{1.73_m2} (ref >59)
GFR calc non Af Amer: 106 mL/min/{1.73_m2} (ref >59)
Globulin, Total: 3.6 g/dL (ref 1.5–4.5)
Glucose: 130 mg/dL — ABNORMAL HIGH (ref 65–99)
Potassium: 4.6 mmol/L (ref 3.5–5.2)
Sodium: 136 mmol/L (ref 134–144)
Total Protein: 7.1 g/dL (ref 6.0–8.5)

## 2019-08-18 LAB — CBC WITH DIFFERENTIAL/PLATELET
Basophils Absolute: 0.1 10*3/uL (ref 0.0–0.2)
Basos: 0 %
EOS (ABSOLUTE): 0.2 10*3/uL (ref 0.0–0.4)
Eos: 1 %
Hematocrit: 32.4 % — ABNORMAL LOW (ref 37.5–51.0)
Hemoglobin: 9.5 g/dL — ABNORMAL LOW (ref 13.0–17.7)
Immature Grans (Abs): 0.1 10*3/uL (ref 0.0–0.1)
Immature Granulocytes: 1 %
Lymphocytes Absolute: 8.6 10*3/uL — ABNORMAL HIGH (ref 0.7–3.1)
Lymphs: 48 %
MCH: 23 pg — ABNORMAL LOW (ref 26.6–33.0)
MCHC: 29.3 g/dL — ABNORMAL LOW (ref 31.5–35.7)
MCV: 79 fL (ref 79–97)
Monocytes Absolute: 1.7 10*3/uL — ABNORMAL HIGH (ref 0.1–0.9)
Monocytes: 10 %
Neutrophils Absolute: 7.3 10*3/uL — ABNORMAL HIGH (ref 1.4–7.0)
Neutrophils: 40 %
Platelets: 394 10*3/uL (ref 150–450)
RBC: 4.13 x10E6/uL — ABNORMAL LOW (ref 4.14–5.80)
RDW: 16.3 % — ABNORMAL HIGH (ref 11.6–15.4)
WBC: 18 10*3/uL — ABNORMAL HIGH (ref 3.4–10.8)

## 2019-08-18 LAB — LIPID PANEL
Chol/HDL Ratio: 2.9 ratio (ref 0.0–5.0)
Cholesterol, Total: 139 mg/dL (ref 100–199)
HDL: 48 mg/dL (ref >39)
LDL Chol Calc (NIH): 78 mg/dL (ref 0–99)
Triglycerides: 60 mg/dL (ref 0–149)
VLDL Cholesterol Cal: 13 mg/dL (ref 5–40)

## 2019-08-25 ENCOUNTER — Encounter: Payer: Self-pay | Admitting: Family Medicine

## 2019-08-25 ENCOUNTER — Ambulatory Visit: Payer: BC Managed Care – PPO | Admitting: Family Medicine

## 2019-08-25 ENCOUNTER — Other Ambulatory Visit: Payer: Self-pay

## 2019-08-25 VITALS — BP 139/83 | HR 86 | Temp 98.0°F | Ht 65.0 in | Wt 205.4 lb

## 2019-08-25 DIAGNOSIS — E785 Hyperlipidemia, unspecified: Secondary | ICD-10-CM

## 2019-08-25 DIAGNOSIS — E1159 Type 2 diabetes mellitus with other circulatory complications: Secondary | ICD-10-CM | POA: Diagnosis not present

## 2019-08-25 DIAGNOSIS — D509 Iron deficiency anemia, unspecified: Secondary | ICD-10-CM

## 2019-08-25 DIAGNOSIS — E1169 Type 2 diabetes mellitus with other specified complication: Secondary | ICD-10-CM | POA: Diagnosis not present

## 2019-08-25 DIAGNOSIS — I10 Essential (primary) hypertension: Secondary | ICD-10-CM

## 2019-08-25 LAB — BAYER DCA HB A1C WAIVED: HB A1C (BAYER DCA - WAIVED): 6.6 % (ref <7.0)

## 2019-08-25 MED ORDER — ATORVASTATIN CALCIUM 40 MG PO TABS: 40.0000 mg | ORAL_TABLET | Freq: Every day | ORAL | 1 refills | Status: DC

## 2019-08-25 MED ORDER — LOSARTAN POTASSIUM 25 MG PO TABS: 25.0000 mg | ORAL_TABLET | Freq: Every day | ORAL | 1 refills | Status: DC

## 2019-08-25 MED ORDER — FEROSUL 325 (65 FE) MG PO TABS: 325.0000 mg | ORAL_TABLET | Freq: Three times a day (TID) | ORAL | 1 refills | Status: DC

## 2019-08-25 MED ORDER — METFORMIN HCL 1000 MG PO TABS: 1000.0000 mg | ORAL_TABLET | Freq: Two times a day (BID) | ORAL | 1 refills | Status: DC

## 2019-08-25 MED ORDER — GLIPIZIDE 5 MG PO TABS: 5.0000 mg | ORAL_TABLET | Freq: Every day | ORAL | 1 refills | Status: DC

## 2019-08-25 NOTE — Progress Notes (Signed)
Assessment & Plan:  1. DM type 2 with diabetic dyslipidemia (HCC) A1c 6.6 today. - Diabetes is at goal of A1c < 7. - Medications: continue current medications - Patient is currently taking a statin. Patient is taking an ACE-inhibitor/ARB.  - Last foot exam: 08/26/2018 - Last diabetic eye exam: UTD per patient - record release signed for [facility] - Urine Microalbumin/Creat Ratio: today - Instruction/counseling given: discussed diet and provided printed educational material - Bayer DCA Hb A1c Waived - glipiZIDE (GLUCOTROL) 5 MG tablet; Take 1 tablet (5 mg total) by mouth daily.  Dispense: 90 tablet; Refill: 1 - metFORMIN (GLUCOPHAGE) 1000 MG tablet; Take 1 tablet (1,000 mg total) by mouth 2 (two) times daily with a meal.  Dispense: 180 tablet; Refill: 1 - Microalbumin / creatinine urine ratio  2. Hypertension associated with type 2 diabetes mellitus ([place]) - Well controlled on current regimen.  - losartan (COZAAR) 25 MG tablet; Take 1 tablet (25 mg total) by mouth daily.  Dispense: 90 tablet; Refill: 1  3. Hyperlipidemia associated with type 2 diabetes mellitus ([place]) - Well controlled on current regimen.  - atorvastatin (LIPITOR) 40 MG tablet; Take 1 tablet (40 mg total) by mouth at bedtime.  Dispense: 90 tablet; Refill: 1  4. Iron deficiency anemia, unspecified iron deficiency anemia type - Patient is scheduled for colonoscopy later this month.  - FEROSUL 325 (65 Fe) MG tablet; Take 1 tablet (325 mg total) by mouth 3 (three) times daily.  Dispense: 270 tablet; Refill: 1   Return in about 3 months (around 11/25/2019) for follow-up of chronic medication conditions.  Hendricks Limes, MSN, APRN, FNP-C [facility]  Subjective:    Patient ID: George Anthony, male    DOB: [birth year], 69 y.o.   MRN: [id number]  Patient Care Team: Loman Brooklyn, FNP as PCP - General (Family Medicine)   Chief Complaint:  Chief Complaint  Patient presents with   Diabetes     check up of chronic medical conditions   Establish Care    Rakes pt     HPI: George Anthony is a 69 y.o. male presenting on 08/25/2019 for Diabetes (check up of chronic medical conditions) and Establish Care (Rakes pt )  Diabetes: Patient presents for follow up of diabetes. Current symptoms include: none. Known diabetic complications: none. Medication compliance: yes. Current diet: in general, an "unhealthy" diet. Current exercise: none. Home blood sugar records: BGs are running  consistent with Hgb A1C. Is he  on ACE inhibitor or angiotensin II receptor blocker? Yes. Is he on a statin? Yes.   Last A1c 7.2 on 04/12/2019; A1c 6.6 today.  Lab Results  Component Value Date   LDLCALC 78 08/17/2019   CREATININE 0.56 (L) 08/17/2019     Hypertension: Patient here for follow-up of elevated blood pressure. He is not exercising and is not adherent to low salt diet.  Blood pressure is well controlled at home. Cardiac symptoms none. Cardiovascular risk factors: advanced age (older than 5 for men, 36 for women), diabetes mellitus, dyslipidemia, hypertension, male gender, obesity (BMI >= 30 kg/m2), sedentary lifestyle and smoking/ tobacco exposure. Use of agents associated with hypertension: none. History of target organ damage: none.   New complaints: None  Social history:  Relevant past medical, surgical, family and social history reviewed and updated as indicated. Interim medical history since our last visit reviewed.  Allergies and medications reviewed and updated.  DATA REVIEWED: CHART IN EPIC  ROS: Negative unless specifically indicated  above in HPI.    Current Outpatient Medications:    B-D UF III MINI PEN NEEDLES 31G X 5 MM MISC, SMARTSIG:1 Pre-Filled Pen Syringe SUB-Q Every Night, Disp: 100 each, Rfl: 1   FEROSUL 325 (65 Fe) MG tablet, Take 325 mg by mouth 3 (three) times daily., Disp: , Rfl:    glipiZIDE (GLUCOTROL) 5 MG tablet, Take 1 tablet by mouth once daily, Disp: 90 tablet,  Rfl: 0   glucose blood test strip, Check TID and PRN dx E11.9, Disp: 100 each, Rfl: 11   LANTUS SOLOSTAR 100 UNIT/ML Solostar Pen, Inject 15 Units into the skin daily., Disp: 15 mL, Rfl: 6   atorvastatin (LIPITOR) 40 MG tablet, Take 1 tablet (40 mg total) by mouth at bedtime., Disp: 30 tablet, Rfl: 5   losartan (COZAAR) 25 MG tablet, Take 1 tablet (25 mg total) by mouth daily., Disp: 30 tablet, Rfl: 1   metFORMIN (GLUCOPHAGE) 1000 MG tablet, Take 1 tablet (1,000 mg total) by mouth 2 (two) times daily with a meal., Disp: 60 tablet, Rfl: 3   No Known Allergies Past Medical History:  Diagnosis Date   Diabetes mellitus without complication ([place])    Enlarged heart    Hyperlipidemia    Hypertension     History reviewed. No pertinent surgical history.  Social History   Socioeconomic History   Marital status: Married    Spouse name: charlene   Number of children: 1   Years of education: Not on file   Highest education level: Not on file  Occupational History    Employer: [employer]  Tobacco Use   Smoking status: Current Every [name]    Types: Cigarettes   Smokeless tobacco: Never Used  Vaping Use   Vaping Use: Never used  Substance and Sexual Activity   Alcohol use: Yes    Alcohol/week: 3.0 standard drinks    Types: 3 Cans of beer per week    Comment: drinking a couple times a month for the last year.- used to drink 2 12 packs a week   Drug use: No   Sexual activity: Not on file  Other Topics Concern   Not on file  Social History Narrative   Not on file   Social Determinants of Health   Financial Resource Strain:    Difficulty of Paying Living Expenses:   Food Insecurity:    Worried About Charity fundraiser in the Last Year:    Arboriculturist in the Last Year:   Transportation Needs:    Film/video editor (Medical):    Lack of Transportation (Non-Medical):   Physical Activity:    Days of Exercise per Week:    Minutes of  Exercise per Session:   Stress:    Feeling of Stress :   Social Connections:    Frequency of Communication with Friends and Family:    Frequency of Social Gatherings with Friends and Family:    Attends Religious Services:    Active Member of Clubs or Organizations:    Attends Music therapist:    Marital Status:   Intimate Partner Violence:    Fear of Current or Ex-Partner:    Emotionally Abused:    Physically Abused:    Sexually Abused:         Objective:    BP 139/83    Pulse 86    Temp 98 F (36.7 C) (Temporal)    Ht 5\' 5"  (1.651 m)    Wt  205 lb 6.4 oz (93.2 kg)    SpO2 100%    BMI 34.18 kg/m   Wt Readings from Last 3 Encounters:  08/25/19 205 lb 6.4 oz (93.2 kg)  07/14/19 205 lb (93 kg)  04/15/19 206 lb (93.4 kg)    Physical Exam Vitals reviewed.  Constitutional:      General: He is not in acute distress.    Appearance: Normal appearance. He is obese. He is not ill-appearing, toxic-appearing or diaphoretic.  HENT:     Head: Normocephalic and atraumatic.  Eyes:     General: No scleral icterus.       Right eye: No discharge.        Left eye: No discharge.     Conjunctiva/sclera: Conjunctivae normal.  Cardiovascular:     Rate and Rhythm: Normal rate and regular rhythm.     Heart sounds: Normal heart sounds. No murmur heard.  No friction rub. No gallop.   Pulmonary:     Effort: Pulmonary effort is normal. No respiratory distress.     Breath sounds: Normal breath sounds. No stridor. No wheezing, rhonchi or rales.  Musculoskeletal:        General: Normal range of motion.     Cervical back: Normal range of motion.  Skin:    General: Skin is warm and dry.  Neurological:     Mental Status: He is alert and oriented to person, place, and time. Mental status is at baseline.  Psychiatric:        Mood and Affect: Mood normal.        Behavior: Behavior normal.        Thought Content: Thought content normal.        Judgment: Judgment normal.      Lab Results  Component Value Date   TSH 1.560 08/26/2018   Lab Results  Component Value Date   WBC 18.0 (H) 08/17/2019   HGB 9.5 (L) 08/17/2019   HCT 32.4 (L) 08/17/2019   MCV 79 08/17/2019   PLT 394 08/17/2019   Lab Results  Component Value Date   NA 136 08/17/2019   K 4.6 08/17/2019   CO2 22 08/17/2019   GLUCOSE 130 (H) 08/17/2019   BUN 8 08/17/2019   CREATININE 0.56 (L) 08/17/2019   BILITOT <0.2 08/17/2019   ALKPHOS 122 (H) 08/17/2019   AST 9 08/17/2019   ALT 18 08/17/2019   PROT 7.1 08/17/2019   ALBUMIN 3.5 (L) 08/17/2019   CALCIUM 8.6 08/17/2019   Lab Results  Component Value Date   CHOL 139 08/17/2019   Lab Results  Component Value Date   HDL 48 08/17/2019   Lab Results  Component Value Date   LDLCALC 78 08/17/2019   Lab Results  Component Value Date   TRIG 60 08/17/2019   Lab Results  Component Value Date   CHOLHDL 2.9 08/17/2019   Lab Results  Component Value Date   HGBA1C 7.2 (H) 04/12/2019

## 2019-08-25 NOTE — Patient Instructions (Signed)

## 2019-08-26 LAB — MICROALBUMIN / CREATININE URINE RATIO
Creatinine, Urine: 81.2 mg/dL
Microalb/Creat Ratio: 53 mg/g creat — ABNORMAL HIGH (ref 0–29)
Microalbumin, Urine: 43.1 ug/mL

## 2019-09-01 ENCOUNTER — Other Ambulatory Visit: Payer: Self-pay

## 2019-09-01 ENCOUNTER — Encounter (HOSPITAL_COMMUNITY)
Admission: RE | Admit: 2019-09-01 | Discharge: 2019-09-01 | Disposition: A | Discharge status: Home / Self Care | Payer: BC Managed Care – PPO | Source: Ambulatory Visit | Attending: Internal Medicine | Admitting: Internal Medicine

## 2019-09-01 ENCOUNTER — Encounter (HOSPITAL_COMMUNITY): Payer: Self-pay

## 2019-09-15 ENCOUNTER — Other Ambulatory Visit: Payer: Self-pay

## 2019-09-15 ENCOUNTER — Encounter (HOSPITAL_COMMUNITY)
Admission: RE | Admit: 2019-09-15 | Discharge: 2019-09-15 | Disposition: A | Discharge status: Home / Self Care | Payer: BC Managed Care – PPO | Source: Ambulatory Visit | Attending: Internal Medicine | Admitting: Internal Medicine

## 2019-09-15 ENCOUNTER — Other Ambulatory Visit (HOSPITAL_COMMUNITY)
Admission: RE | Admit: 2019-09-15 | Discharge: 2019-09-15 | Disposition: A | Discharge status: Home / Self Care | Payer: BC Managed Care – PPO | Source: Ambulatory Visit | Attending: Internal Medicine | Admitting: Internal Medicine

## 2019-09-15 DIAGNOSIS — Z01818 Encounter for other preprocedural examination: Secondary | ICD-10-CM | POA: Insufficient documentation

## 2019-09-15 DIAGNOSIS — F17210 Nicotine dependence, cigarettes, uncomplicated: Secondary | ICD-10-CM | POA: Diagnosis not present

## 2019-09-15 DIAGNOSIS — Z794 Long term (current) use of insulin: Secondary | ICD-10-CM | POA: Diagnosis not present

## 2019-09-15 DIAGNOSIS — K5730 Diverticulosis of large intestine without perforation or abscess without bleeding: Secondary | ICD-10-CM | POA: Diagnosis not present

## 2019-09-15 DIAGNOSIS — I10 Essential (primary) hypertension: Secondary | ICD-10-CM | POA: Diagnosis not present

## 2019-09-15 DIAGNOSIS — Z79899 Other long term (current) drug therapy: Secondary | ICD-10-CM | POA: Diagnosis not present

## 2019-09-15 DIAGNOSIS — E119 Type 2 diabetes mellitus without complications: Secondary | ICD-10-CM | POA: Diagnosis not present

## 2019-09-15 DIAGNOSIS — R195 Other fecal abnormalities: Secondary | ICD-10-CM | POA: Diagnosis not present

## 2019-09-15 DIAGNOSIS — K648 Other hemorrhoids: Secondary | ICD-10-CM | POA: Diagnosis not present

## 2019-09-15 DIAGNOSIS — K635 Polyp of colon: Secondary | ICD-10-CM | POA: Diagnosis not present

## 2019-09-15 DIAGNOSIS — Z20822 Contact with and (suspected) exposure to covid-19: Secondary | ICD-10-CM | POA: Diagnosis not present

## 2019-09-15 DIAGNOSIS — E785 Hyperlipidemia, unspecified: Secondary | ICD-10-CM | POA: Diagnosis not present

## 2019-09-15 DIAGNOSIS — D649 Anemia, unspecified: Secondary | ICD-10-CM | POA: Diagnosis not present

## 2019-09-15 LAB — SARS CORONAVIRUS 2 (TAT 6-24 HRS): SARS Coronavirus 2: NEGATIVE

## 2019-09-16 ENCOUNTER — Ambulatory Visit (HOSPITAL_COMMUNITY): Payer: BC Managed Care – PPO | Admitting: Anesthesiology

## 2019-09-16 ENCOUNTER — Ambulatory Visit (HOSPITAL_COMMUNITY)
Admission: RE | Admit: 2019-09-16 | Discharge: 2019-09-16 | Disposition: A | Discharge status: Home / Self Care | Payer: BC Managed Care – PPO | Source: Ambulatory Visit | Attending: Internal Medicine | Admitting: Internal Medicine

## 2019-09-16 ENCOUNTER — Other Ambulatory Visit: Payer: Self-pay

## 2019-09-16 ENCOUNTER — Encounter (HOSPITAL_COMMUNITY)
Admission: RE | Disposition: A | Discharge status: Home / Self Care | Payer: Self-pay | Source: Ambulatory Visit | Attending: Internal Medicine

## 2019-09-16 ENCOUNTER — Encounter (HOSPITAL_COMMUNITY): Payer: Self-pay

## 2019-09-16 DIAGNOSIS — I10 Essential (primary) hypertension: Secondary | ICD-10-CM | POA: Diagnosis not present

## 2019-09-16 DIAGNOSIS — E119 Type 2 diabetes mellitus without complications: Secondary | ICD-10-CM | POA: Diagnosis not present

## 2019-09-16 DIAGNOSIS — E785 Hyperlipidemia, unspecified: Secondary | ICD-10-CM | POA: Insufficient documentation

## 2019-09-16 DIAGNOSIS — D649 Anemia, unspecified: Secondary | ICD-10-CM | POA: Insufficient documentation

## 2019-09-16 DIAGNOSIS — Z20822 Contact with and (suspected) exposure to covid-19: Secondary | ICD-10-CM | POA: Diagnosis not present

## 2019-09-16 DIAGNOSIS — K5730 Diverticulosis of large intestine without perforation or abscess without bleeding: Secondary | ICD-10-CM | POA: Insufficient documentation

## 2019-09-16 DIAGNOSIS — K635 Polyp of colon: Secondary | ICD-10-CM | POA: Insufficient documentation

## 2019-09-16 DIAGNOSIS — K648 Other hemorrhoids: Secondary | ICD-10-CM | POA: Diagnosis not present

## 2019-09-16 DIAGNOSIS — Z79899 Other long term (current) drug therapy: Secondary | ICD-10-CM | POA: Insufficient documentation

## 2019-09-16 DIAGNOSIS — F17210 Nicotine dependence, cigarettes, uncomplicated: Secondary | ICD-10-CM | POA: Insufficient documentation

## 2019-09-16 DIAGNOSIS — R195 Other fecal abnormalities: Secondary | ICD-10-CM | POA: Insufficient documentation

## 2019-09-16 DIAGNOSIS — Z794 Long term (current) use of insulin: Secondary | ICD-10-CM | POA: Insufficient documentation

## 2019-09-16 HISTORY — PX: BIOPSY: SHX5522

## 2019-09-16 HISTORY — PX: COLONOSCOPY WITH PROPOFOL: SHX5780

## 2019-09-16 HISTORY — PX: POLYPECTOMY: SHX5525

## 2019-09-16 LAB — GLUCOSE, CAPILLARY: Glucose-Capillary: 137 mg/dL — ABNORMAL HIGH (ref 70–99)

## 2019-09-16 SURGERY — COLONOSCOPY WITH PROPOFOL
Anesthesia: General

## 2019-09-16 MED ORDER — CHLORHEXIDINE GLUCONATE CLOTH 2 % EX PADS: 6.0000 | MEDICATED_PAD | Freq: Once | CUTANEOUS | Status: DC

## 2019-09-16 MED ORDER — LACTATED RINGERS IV SOLN: INTRAVENOUS | Status: DC | PRN

## 2019-09-16 MED ORDER — LACTATED RINGERS IV SOLN: INTRAVENOUS | Status: DC

## 2019-09-16 MED ORDER — STERILE WATER FOR IRRIGATION IR SOLN
Status: DC | PRN
  Administered 2019-09-16: 1.5 mL

## 2019-09-16 MED ORDER — PROPOFOL 10 MG/ML IV BOLUS
INTRAVENOUS | Status: AC
  Filled 2019-09-16: qty 60

## 2019-09-16 MED ORDER — PROPOFOL 10 MG/ML IV BOLUS
INTRAVENOUS | Status: DC | PRN
  Administered 2019-09-16 (×4): 20 mg via INTRAVENOUS
  Administered 2019-09-16: 30 mg via INTRAVENOUS
  Administered 2019-09-16: 10 mg via INTRAVENOUS

## 2019-09-16 MED ORDER — PROPOFOL 500 MG/50ML IV EMUL
INTRAVENOUS | Status: DC | PRN
  Administered 2019-09-16: 150 ug/kg/min via INTRAVENOUS

## 2019-09-16 NOTE — Anesthesia Preprocedure Evaluation (Signed)
Anesthesia Evaluation  Patient identified by MRN, date of birth, ID band Patient awake    Reviewed: Allergy & Precautions, H&P , NPO status , Patient's Chart, lab work & pertinent test results, reviewed documented beta blocker date and time   Airway Mallampati: II  TM Distance: >3 FB Neck ROM: full    Dental no notable dental hx. (+) Caps, Implants   Pulmonary neg pulmonary ROS, Current Smoker,    Pulmonary exam normal breath sounds clear to auscultation       Cardiovascular Exercise Tolerance: Good hypertension, negative cardio ROS   Rhythm:regular Rate:Normal     Neuro/Psych PSYCHIATRIC DISORDERS Anxiety negative neurological ROS     GI/Hepatic negative GI ROS, Neg liver ROS,   Endo/Other  negative endocrine ROSdiabetes  Renal/GU negative Renal ROS  negative genitourinary   Musculoskeletal   Abdominal   Peds  Hematology  (+) Blood dyscrasia, anemia ,   Anesthesia Other Findings   Reproductive/Obstetrics negative OB ROS                             Anesthesia Physical Anesthesia Plan  ASA: II  Anesthesia Plan: General   Post-op Pain Management:    Induction:   PONV Risk Score and Plan:   Airway Management Planned:   Additional Equipment:   Intra-op Plan:   Post-operative Plan:   Informed Consent: I have reviewed the patients History and Physical, chart, labs and discussed the procedure including the risks, benefits and alternatives for the proposed anesthesia with the patient or authorized representative who has indicated his/her understanding and acceptance.     Dental Advisory Given  Plan Discussed with: CRNA  Anesthesia Plan Comments:         Anesthesia Quick Evaluation

## 2019-09-16 NOTE — H&P (Signed)
Primary Care Physician:  Loman Brooklyn, FNP Primary Gastroenterologist:  [name]  Pre-Procedure History & Physical: HPI:  Ahmaad Neidhardt is a 69 y.o. male is here for a diagnostic colonoscopy for positive cologuard.   Past Medical History:  Diagnosis Date  . Diabetes mellitus without complication ([place])   . Enlarged heart   . Hyperlipidemia   . Hypertension     History reviewed. No pertinent surgical history.  Prior to Admission medications   Medication Sig Start Date End Date Taking? Authorizing Provider  acetaminophen (TYLENOL) 500 MG tablet Take 1,000 mg by mouth every 6 (six) hours as needed for moderate pain.   Yes [provider]  atorvastatin (LIPITOR) 40 MG tablet Take 1 tablet (40 mg total) by mouth at bedtime. 08/25/19  Yes Loman Brooklyn, FNP  FEROSUL 325 (65 Fe) MG tablet Take 1 tablet (325 mg total) by mouth 3 (three) times daily. Patient taking differently: Take 325 mg by mouth daily.  08/25/19  Yes Hendricks Limes F, FNP  glipiZIDE (GLUCOTROL) 5 MG tablet Take 1 tablet (5 mg total) by mouth daily. 08/25/19  Yes Loman Brooklyn, FNP  LANTUS SOLOSTAR 100 UNIT/ML Solostar Pen Inject 15 Units into the skin daily. Patient taking differently: Inject 15 Units into the skin every evening.  04/15/19  Yes Rakes, Connye Burkitt, FNP  losartan (COZAAR) 25 MG tablet Take 1 tablet (25 mg total) by mouth daily. 08/25/19  Yes Hendricks Limes F, FNP  metFORMIN (GLUCOPHAGE) 1000 MG tablet Take 1 tablet (1,000 mg total) by mouth 2 (two) times daily with a meal. 08/25/19  Yes Loman Brooklyn, FNP  B-D UF III MINI PEN NEEDLES 31G X 5 MM MISC SMARTSIG:1 Pre-Filled Pen Syringe SUB-Q Every Night 12/30/18   Baruch Gouty, FNP  glucose blood test strip Check TID and PRN dx E11.9 02/17/19   Baruch Gouty, FNP    Allergies as of 08/02/2019  . (No Known Allergies)    Family History  Problem Relation Age of Onset  . Emphysema Mother   . Drug abuse Sister   . Colon cancer Neg Hx     Social  History   Socioeconomic History  . Marital status: Married    Spouse name: charlene  . Number of children: 1  . Years of education: Not on file  . Highest education level: Not on file  Occupational History    Employer: [employer]  Tobacco Use  . Smoking status: Current Every Day Smoker    Packs/day: 1.00    Years: 40.00    Pack years: 40.00    Types: Cigarettes  . Smokeless tobacco: Never Used  Vaping Use  . Vaping Use: Never used  Substance and Sexual Activity  . Alcohol use: Yes    Alcohol/week: 3.0 standard drinks    Types: 3 Cans of beer per week    Comment: drinking a couple times a month for the last year.- used to drink 2 12 packs a week  . Drug use: No  . Sexual activity: Yes  Other Topics Concern  . Not on file  Social History Narrative  . Not on file   Social Determinants of Health   Financial Resource Strain:   . Difficulty of Paying Living Expenses: Not on file  Food Insecurity:   . Worried About Charity fundraiser in the Last Year: Not on file  . Ran Out of Food in the Last Year: Not on file  Transportation Needs:   .  Lack of Transportation (Medical): Not on file  . Lack of Transportation (Non-Medical): Not on file  Physical Activity:   . Days of Exercise per Week: Not on file  . Minutes of Exercise per Session: Not on file  Stress:   . Feeling of Stress : Not on file  Social Connections:   . Frequency of Communication with Friends and Family: Not on file  . Frequency of Social Gatherings with Friends and Family: Not on file  . Attends Religious Services: Not on file  . Active Member of Clubs or Organizations: Not on file  . Attends Archivist Meetings: Not on file  . Marital Status: Not on file  Intimate Partner Violence:   . Fear of Current or Ex-Partner: Not on file  . Emotionally Abused: Not on file  . Physically Abused: Not on file  . Sexually Abused: Not on file    Review of Systems: See HPI, otherwise negative  ROS  Impression/Plan: Traveion Ruddock is here for a colonoscopy to be performed for positive cologuard  Risks, benefits, limitations, imponderables and alternatives regarding colonoscopy have been reviewed with the patient. Questions have been answered. All parties agreeable.

## 2019-09-16 NOTE — Transfer of Care (Signed)
Immediate Anesthesia Transfer of Care Note  Patient: George Anthony  Procedure(s) Performed: COLONOSCOPY WITH PROPOFOL (N/A ) BIOPSY POLYPECTOMY  Patient Location: PACU and Endoscopy Unit  Anesthesia Type:General  Level of Consciousness: awake  Airway & Oxygen Therapy: Patient Spontanous Breathing  Post-op Assessment: Report given to RN  Post vital signs: Reviewed and stable  Last Vitals:  Vitals Value Taken Time  BP    Temp    Pulse    Resp    SpO2      Last Pain:  Vitals:   09/16/19 0739  TempSrc:   PainSc: 0-No pain      Patients Stated Pain Goal: 8 (42/59/56 [id number])  Complications: No complications documented.

## 2019-09-16 NOTE — Op Note (Signed)
[facility] Patient Name: George Anthony Procedure Date: 09/16/2019 7:04 AM MRN: [id number] Date of Birth: Mar 31, 1950 Attending MD: Elon Alas. Abbey Chatters DO CSN: [id number] Age: 69 Admit Type: Outpatient Procedure:                Colonoscopy Indications:              Positive Cologuard test Providers:                Elon Alas. Abbey Chatters, DO, Janeece Riggers, RN, Lambert Mody, Casimer Bilis, Technician,                            Randa Spike, Technician Referring MD:              Medicines:                See the Anesthesia note for documentation of the                            administered medications Complications:            No immediate complications. Estimated Blood Loss:     Estimated blood loss was minimal. Procedure:                Pre-Anesthesia Assessment:                           - The anesthesia plan was to use monitored                            anesthesia care (MAC).                           After obtaining informed consent, the colonoscope                            was passed under direct vision. Throughout the                            procedure, the patient's blood pressure, pulse, and                            oxygen saturations were monitored continuously. The                            PCF-[id number] ([id number]) was introduced through the                            anus and advanced to the the cecum, identified by                            appendiceal orifice and ileocecal valve. The                            colonoscopy was performed without difficulty. The  patient tolerated the procedure well. The quality                            of the bowel preparation was evaluated using the                            BBPS [facility] Bowel Preparation Scale) with scores                            of: Right Colon = 2 (minor amount of residual                            staining, small fragments of stool and/or  opaque                            liquid, but mucosa seen well), Transverse Colon = 3                            (entire mucosa seen well with no residual staining,                            small fragments of stool or opaque liquid) and Left                            Colon = 3 (entire mucosa seen well with no residual                            staining, small fragments of stool or opaque                            liquid). The total BBPS score equals 8. The quality                            of the bowel preparation was good. Scope In: 7:40:25 AM Scope Out: 7:59:30 AM Scope Withdrawal Time: 0 hours 14 minutes 14 seconds  Total Procedure Duration: 0 hours 19 minutes 5 seconds  Findings:      The perianal and digital rectal examinations were normal.      Non-bleeding internal hemorrhoids were found during endoscopy.      Multiple small and large-mouthed diverticula were found in the sigmoid       colon and descending colon.      A 2 mm polyp was found in the ascending colon. The polyp was sessile.       The polyp was removed with a jumbo cold forceps. Resection and retrieval       were complete.      A 7 mm polyp was found in the ascending colon. The polyp was sessile.       The polyp was removed with a cold snare. Resection and retrieval were       complete.      A 10 mm polyp versus possible lipoma was found in the descending colon.       The polyp was sessile. The polyp was removed with a hot snare. Resection  and retrieval were complete.      The terminal ileum appeared normal. Impression:               - Non-bleeding internal hemorrhoids.                           - Diverticulosis in the sigmoid colon and in the                            descending colon.                           - One 2 mm polyp in the ascending colon, removed                            with a jumbo cold forceps. Resected and retrieved.                           - One 7 mm polyp in the ascending colon,  removed                            with a cold snare. Resected and retrieved.                           - One 10 mm polyp in the descending colon, removed                            with a hot snare. Resected and retrieved.                           - The examined portion of the ileum was normal. Moderate Sedation:      Per Anesthesia Care Recommendation:           - Patient has a contact number available for                            emergencies. The signs and symptoms of potential                            delayed complications were discussed with the                            patient. Return to normal activities tomorrow.                            Written discharge instructions were provided to the                            patient.                           - Resume previous diet.                           - Continue present medications.                           -  Await pathology results.                           - Repeat colonoscopy in 3 - 5 years for                            surveillance based on pathology results.                           - Return to GI clinic PRN. Procedure Code(s):        --- Professional ---                           [phone], Colonoscopy, flexible; with removal of                            tumor(s), polyp(s), or other lesion(s) by snare                            technique                           [id number], 33, Colonoscopy, flexible; with biopsy,                            single or multiple Diagnosis Code(s):        --- Professional ---                           K64.8, Other hemorrhoids                           K63.5, Polyp of colon                           R19.5, Other fecal abnormalities                           K57.30, Diverticulosis of large intestine without                            perforation or abscess without bleeding CPT copyright 2019 American Medical Association. All rights reserved. The codes documented in this report are  preliminary and upon coder review may  be revised to meet current compliance requirements. Elon Alas. Abbey Chatters, [place] Abbey Chatters, DO 09/16/2019 8:05:06 AM This report has been signed electronically. Number of Addenda: 0

## 2019-09-16 NOTE — Discharge Instructions (Addendum)
Colonoscopy Discharge Instructions  Read the instructions outlined below and refer to this sheet in the next few weeks. These discharge instructions provide you with general information on caring for yourself after you leave the hospital. Your doctor may also give you specific instructions. While your treatment has been planned according to the most current medical practices available, unavoidable complications occasionally occur.   ACTIVITY  You may resume your regular activity, but move at a slower pace for the next 24 hours.   Take frequent rest periods for the next 24 hours.   Walking will help get rid of the air and reduce the bloated feeling in your belly (abdomen).   No driving for 24 hours (because of the medicine (anesthesia) used during the test).    Do not sign any important legal documents or operate any machinery for 24 hours (because of the anesthesia used during the test).  NUTRITION  Drink plenty of fluids.   You may resume your normal diet as instructed by your doctor.   Begin with a light meal and progress to your normal diet. Heavy or fried foods are harder to digest and may make you feel sick to your stomach (nauseated).   Avoid alcoholic beverages for 24 hours or as instructed.  MEDICATIONS  You may resume your normal medications unless your doctor tells you otherwise.  WHAT YOU CAN EXPECT TODAY  Some feelings of bloating in the abdomen.   Passage of more gas than usual.   Spotting of blood in your stool or on the toilet paper.  IF YOU HAD POLYPS REMOVED DURING THE COLONOSCOPY:  No aspirin products for 7 days or as instructed.   No alcohol for 7 days or as instructed.   Eat a soft diet for the next 24 hours.  FINDING OUT THE RESULTS OF YOUR TEST Not all test results are available during your visit. If your test results are not back during the visit, make an appointment with your caregiver to find out the results. Do not assume everything is normal if  you have not heard from your caregiver or the medical facility. It is important for you to follow up on all of your test results.  SEEK IMMEDIATE MEDICAL ATTENTION IF:  You have more than a spotting of blood in your stool.   Your belly is swollen (abdominal distention).   You are nauseated or vomiting.   You have a temperature over 101.   You have abdominal pain or discomfort that is severe or gets worse throughout the day.   High-Fiber Diet A high-fiber diet changes your normal diet to include more whole grains, legumes, fruits, and vegetables. Changes in the diet involve replacing refined carbohydrates with unrefined foods. The calorie level of the diet is essentially unchanged. The Dietary Reference Intake (recommended amount) for adult males is 38 grams per day. For adult females, it is 25 grams per day. Pregnant and lactating women should consume 28 grams of fiber per day. Fiber is the intact part of a plant that is not broken down during digestion. Functional fiber is fiber that has been isolated from the plant to provide a beneficial effect in the body.   PURPOSE  Increase stool bulk.   Ease and regulate bowel movements.   Lower cholesterol.   REDUCE RISK OF COLON CANCER   INDICATIONS THAT YOU NEED MORE FIBER  Constipation and hemorrhoids.   Uncomplicated diverticulosis (intestine condition) and irritable bowel syndrome.   Weight management.   As a protective  measure against hardening of the arteries (atherosclerosis), diabetes, and cancer.    GUIDELINES FOR INCREASING FIBER IN THE DIET  Start adding fiber to the diet slowly. A gradual increase of about 5 more grams (2 slices of whole-wheat bread, 2 servings of most fruits or vegetables, or 1 bowl of high-fiber cereal) per day is best. Too rapid an increase in fiber may result in constipation, flatulence, and bloating.   Drink enough water and fluids to keep your urine clear or pale yellow. Water, juice, or  caffeine-free drinks are recommended. Not drinking enough fluid may cause constipation.   Eat a variety of high-fiber foods rather than one type of fiber.   Try to increase your intake of fiber through using high-fiber foods rather than fiber pills or supplements that contain small amounts of fiber.   The goal is to change the types of food eaten. Do not supplement your present diet with high-fiber foods, but replace foods in your present diet.    INCLUDE A VARIETY OF FIBER SOURCES  Replace refined and processed grains with whole grains, canned fruits with fresh fruits, and incorporate other fiber sources. White rice, white breads, and most bakery goods contain little or no fiber.   Brown whole-grain rice, buckwheat oats, and many fruits and vegetables are all good sources of fiber. These include: broccoli, Brussels sprouts, cabbage, cauliflower, beets, sweet potatoes, white potatoes (skin on), carrots, tomatoes, eggplant, squash, berries, fresh fruits, and dried fruits.   Cereals appear to be the richest source of fiber. Cereal fiber is found in whole grains and bran. Bran is the fiber-rich outer coat of cereal grain, which is largely removed in refining. In whole-grain cereals, the bran remains. In breakfast cereals, the largest amount of fiber is found in those with "bran" in their names. The fiber content is sometimes indicated on the label.   You may need to include additional fruits and vegetables each day.   In baking, for 1 cup white flour, you may use the following substitutions:   1 cup whole-wheat flour minus 2 tablespoons.   1/2 cup white flour plus 1/2 cup whole-wheat flour.    Diverticulosis  Diverticulosis is a condition that develops when small pouches (diverticula) form in the wall of the large intestine (colon). The colon is where water is absorbed and stool (feces) is formed. The pouches form when the inside layer of the colon pushes through weak spots in the outer  layers of the colon. You may have a few pouches or many of them. The pouches usually do not cause problems unless they become inflamed or infected. When this happens, the condition is called diverticulitis. What are the causes? The cause of this condition is not known. What increases the risk? The following factors may make you more likely to develop this condition:  Being older than age 61. Your risk for this condition increases with age. Diverticulosis is rare among people younger than age 26. By age 95, many people have it.  Eating a low-fiber diet.  Having frequent constipation.  Being overweight.  Not getting enough exercise.  Smoking.  Taking over-the-counter pain medicines, like aspirin and ibuprofen.  Having a family history of diverticulosis. What are the signs or symptoms? In most people, there are no symptoms of this condition. If you do have symptoms, they may include:  Bloating.  Cramps in the abdomen.  Constipation or diarrhea.  Pain in the lower left side of the abdomen. How is this diagnosed? Because diverticulosis usually  has no symptoms, it is most often diagnosed during an exam for other colon problems. The condition may be diagnosed by:  Using a flexible scope to examine the colon (colonoscopy).  Taking an X-ray of the colon after dye has been put into the colon (barium enema).  Having a CT scan. How is this treated? You may not need treatment for this condition. Your health care provider may recommend treatment to prevent problems. You may need treatment if you have symptoms or if you previously had diverticulitis. Treatment may include:  Eating a high-fiber diet.  Taking a fiber supplement.  Taking a live bacteria supplement (probiotic).  Taking medicine to relax your colon. Follow these instructions at home: Medicines  Take over-the-counter and prescription medicines only as told by your health care provider.  If told by your health care  provider, take a fiber supplement or probiotic. Constipation prevention Your condition may cause constipation. To prevent or treat constipation, you may need to:  Drink enough fluid to keep your urine pale yellow.  Take over-the-counter or prescription medicines.  Eat foods that are high in fiber, such as beans, whole grains, and fresh fruits and vegetables.  Limit foods that are high in fat and processed sugars, such as fried or sweet foods.  General instructions  Try not to strain when you have a bowel movement.  Keep all follow-up visits as told by your health care provider. This is important. Contact a health care provider if you:  Have pain in your abdomen.  Have bloating.  Have cramps.  Have not had a bowel movement in 3 days. Get help right away if:  Your pain gets worse.  Your bloating becomes very bad.  You have a fever or chills, and your symptoms suddenly get worse.  You vomit.  You have bowel movements that are bloody or black.  You have bleeding from your rectum. Summary  Diverticulosis is a condition that develops when small pouches (diverticula) form in the wall of the large intestine (colon).  You may have a few pouches or many of them.  This condition is most often diagnosed during an exam for other colon problems.  Treatment may include increasing the fiber in your diet, taking supplements, or taking medicines. This information is not intended to replace advice given to you by your health care provider. Make sure you discuss any questions you have with your health care provider. Document Revised: 08/05/2018 Document Reviewed: 08/05/2018 Elsevier Patient Education  [employer].   Your colonoscopy showed 3 polyps which I removed successfully.  Also incidentally found diverticulosis and hemorrhoids.  Await pathology results (my office will contact you next week).  We will plan on repeating colonoscopy in 3 to 5 years depending on pathology  results.  Increase fiber in your diet for diverticulosis.  Follow-up with GI as needed  I hope you have a great rest of your week!  Elon Alas. Abbey Chatters, D.O. Gastroenterology and Hepatology [facility]

## 2019-09-16 NOTE — Anesthesia Postprocedure Evaluation (Signed)
Anesthesia Post Note  Patient: George Anthony  Procedure(s) Performed: COLONOSCOPY WITH PROPOFOL (N/A ) BIOPSY POLYPECTOMY  Patient location during evaluation: Endoscopy Anesthesia Type: General Level of consciousness: awake and alert and oriented Pain management: pain level controlled Vital Signs Assessment: post-procedure vital signs reviewed and stable Respiratory status: spontaneous breathing Cardiovascular status: blood pressure returned to baseline and stable Postop Assessment: no apparent nausea or vomiting Anesthetic complications: no   No complications documented.   Last Vitals:  Vitals:   09/16/19 0702 09/16/19 0805  BP: (!) 154/81 124/79  Pulse: 98 89  Resp: 19 18  Temp: 36.7 C 36.9 C  SpO2: 96% 99%    Last Pain:  Vitals:   09/16/19 0805  TempSrc: Oral  PainSc: 0-No pain                 Shi Blankenship

## 2019-09-19 LAB — SURGICAL PATHOLOGY

## 2019-09-20 ENCOUNTER — Encounter (HOSPITAL_COMMUNITY): Payer: Self-pay | Admitting: Internal Medicine

## 2019-10-20 ENCOUNTER — Other Ambulatory Visit: Payer: Self-pay | Admitting: *Deleted

## 2019-10-20 DIAGNOSIS — E1169 Type 2 diabetes mellitus with other specified complication: Secondary | ICD-10-CM

## 2019-10-20 MED ORDER — BD PEN NEEDLE MINI U/F 31G X 5 MM MISC: 3 refills | Status: DC

## 2019-11-23 DIAGNOSIS — R059 Cough, unspecified: Secondary | ICD-10-CM | POA: Diagnosis not present

## 2019-11-23 DIAGNOSIS — Z6833 Body mass index (BMI) 33.0-33.9, adult: Secondary | ICD-10-CM | POA: Diagnosis not present

## 2019-11-23 DIAGNOSIS — J329 Chronic sinusitis, unspecified: Secondary | ICD-10-CM | POA: Diagnosis not present

## 2019-12-05 DIAGNOSIS — Z6833 Body mass index (BMI) 33.0-33.9, adult: Secondary | ICD-10-CM | POA: Diagnosis not present

## 2019-12-05 DIAGNOSIS — R0981 Nasal congestion: Secondary | ICD-10-CM | POA: Diagnosis not present

## 2019-12-05 DIAGNOSIS — R0602 Shortness of breath: Secondary | ICD-10-CM | POA: Diagnosis not present

## 2019-12-05 DIAGNOSIS — R059 Cough, unspecified: Secondary | ICD-10-CM | POA: Diagnosis not present

## 2020-01-09 ENCOUNTER — Telehealth: Payer: Self-pay

## 2020-01-09 DIAGNOSIS — E1169 Type 2 diabetes mellitus with other specified complication: Secondary | ICD-10-CM

## 2020-01-09 MED ORDER — LANTUS SOLOSTAR 100 UNIT/ML ~~LOC~~ SOPN: 15.0000 [IU] | PEN_INJECTOR | Freq: Every day | SUBCUTANEOUS | 0 refills | Status: DC

## 2020-01-09 NOTE — Telephone Encounter (Signed)
NA / VM full. Refill sent to pharmacy. Pt needs to schedule visit in January

## 2020-01-09 NOTE — Telephone Encounter (Signed)
  Prescription Request  01/09/2020  What is the name of the medication or equipment? lantus  Have you contacted your pharmacy to request a refill? (if applicable) yes  Which pharmacy would you like this sent to? [facility]   Patient notified that their request is being sent to the clinical staff for review and that they should receive a response within 2 business days.

## 2020-03-05 ENCOUNTER — Other Ambulatory Visit: Payer: Self-pay | Admitting: Family Medicine

## 2020-03-05 DIAGNOSIS — E1159 Type 2 diabetes mellitus with other circulatory complications: Secondary | ICD-10-CM

## 2020-03-06 ENCOUNTER — Other Ambulatory Visit: Payer: Self-pay | Admitting: Family Medicine

## 2020-03-06 DIAGNOSIS — E1169 Type 2 diabetes mellitus with other specified complication: Secondary | ICD-10-CM

## 2020-03-22 ENCOUNTER — Encounter: Payer: Self-pay | Admitting: Family Medicine

## 2020-03-22 ENCOUNTER — Other Ambulatory Visit: Payer: Self-pay

## 2020-03-22 ENCOUNTER — Ambulatory Visit: Payer: BC Managed Care – PPO | Admitting: Family Medicine

## 2020-03-22 VITALS — BP 113/63 | HR 97 | Temp 97.9°F | Ht 67.0 in | Wt 183.2 lb

## 2020-03-22 DIAGNOSIS — E785 Hyperlipidemia, unspecified: Secondary | ICD-10-CM | POA: Diagnosis not present

## 2020-03-22 DIAGNOSIS — I152 Hypertension secondary to endocrine disorders: Secondary | ICD-10-CM | POA: Diagnosis not present

## 2020-03-22 DIAGNOSIS — E1169 Type 2 diabetes mellitus with other specified complication: Secondary | ICD-10-CM

## 2020-03-22 DIAGNOSIS — D509 Iron deficiency anemia, unspecified: Secondary | ICD-10-CM

## 2020-03-22 DIAGNOSIS — E1159 Type 2 diabetes mellitus with other circulatory complications: Secondary | ICD-10-CM | POA: Diagnosis not present

## 2020-03-22 LAB — BAYER DCA HB A1C WAIVED: HB A1C (BAYER DCA - WAIVED): 7.9 % — ABNORMAL HIGH (ref <7.0)

## 2020-03-22 MED ORDER — METFORMIN HCL 1000 MG PO TABS: 1000.0000 mg | ORAL_TABLET | Freq: Two times a day (BID) | ORAL | 1 refills | Status: DC

## 2020-03-22 MED ORDER — ATORVASTATIN CALCIUM 40 MG PO TABS: 40.0000 mg | ORAL_TABLET | Freq: Every day | ORAL | 1 refills | Status: DC

## 2020-03-22 MED ORDER — FEROSUL 325 (65 FE) MG PO TABS: 325.0000 mg | ORAL_TABLET | Freq: Every day | ORAL | 1 refills | Status: DC

## 2020-03-22 MED ORDER — LANTUS SOLOSTAR 100 UNIT/ML ~~LOC~~ SOPN: 15.0000 [IU] | PEN_INJECTOR | Freq: Every day | SUBCUTANEOUS | 1 refills | Status: DC

## 2020-03-22 MED ORDER — LOSARTAN POTASSIUM 25 MG PO TABS: 25.0000 mg | ORAL_TABLET | Freq: Every day | ORAL | 1 refills | Status: DC

## 2020-03-22 MED ORDER — GLIPIZIDE 5 MG PO TABS: 5.0000 mg | ORAL_TABLET | Freq: Two times a day (BID) | ORAL | 2 refills | Status: DC

## 2020-03-22 NOTE — Patient Instructions (Signed)
Diabetes Mellitus and Nutrition, Adult When you have diabetes, or diabetes mellitus, it is very important to have healthy eating habits because your blood sugar (glucose) levels are greatly affected by what you eat and drink. Eating healthy foods in the right amounts, at about the same times every day, can help you:  Control your blood glucose.  Lower your risk of heart disease.  Improve your blood pressure.  Reach or maintain a healthy weight. What can affect my meal plan? Every person with diabetes is different, and each person has different needs for a meal plan. Your health care provider may recommend that you work with a dietitian to make a meal plan that is best for you. Your meal plan may vary depending on factors such as:  The calories you need.  The medicines you take.  Your weight.  Your blood glucose, blood pressure, and cholesterol levels.  Your activity level.  Other health conditions you have, such as heart or kidney disease. How do carbohydrates affect me? Carbohydrates, also called carbs, affect your blood glucose level more than any other type of food. Eating carbs naturally raises the amount of glucose in your blood. Carb counting is a method for keeping track of how many carbs you eat. Counting carbs is important to keep your blood glucose at a healthy level, especially if you use insulin or take certain oral diabetes medicines. It is important to know how many carbs you can safely have in each meal. This is different for every person. Your dietitian can help you calculate how many carbs you should have at each meal and for each snack. How does alcohol affect me? Alcohol can cause a sudden decrease in blood glucose (hypoglycemia), especially if you use insulin or take certain oral diabetes medicines. Hypoglycemia can be a life-threatening condition. Symptoms of hypoglycemia, such as sleepiness, dizziness, and confusion, are similar to symptoms of having too much  alcohol.  Do not drink alcohol if: ? Your health care provider tells you not to drink. ? You are pregnant, may be pregnant, or are planning to become pregnant.  If you drink alcohol: ? Do not drink on an empty stomach. ? Limit how much you use to:  0-1 drink a day for women.  0-2 drinks a day for men. ? Be aware of how much alcohol is in your drink. In the [place] one drink equals one 12 oz bottle of beer (355 mL), one 5 oz glass of wine (148 mL), or one 1 oz glass of hard liquor (44 mL). ? Keep yourself hydrated with water, diet soda, or unsweetened iced tea.  Keep in mind that regular soda, juice, and other mixers may contain a lot of sugar and must be counted as carbs. What are tips for following this plan? Reading food labels  Start by checking the serving size on the "Nutrition Facts" label of packaged foods and drinks. The amount of calories, carbs, fats, and other nutrients listed on the label is based on one serving of the item. Many items contain more than one serving per package.  Check the total grams (g) of carbs in one serving. You can calculate the number of servings of carbs in one serving by dividing the total carbs by 15. For example, if a food has 30 g of total carbs per serving, it would be equal to 2 servings of carbs.  Check the number of grams (g) of saturated fats and trans fats in one serving. Choose foods that have   a low amount or none of these fats.  Check the number of milligrams (mg) of salt (sodium) in one serving. Most people should limit total sodium intake to less than 2,300 mg per day.  Always check the nutrition information of foods labeled as "low-fat" or "nonfat." These foods may be higher in added sugar or refined carbs and should be avoided.  Talk to your dietitian to identify your daily goals for nutrients listed on the label. Shopping  Avoid buying canned, pre-made, or processed foods. These foods tend to be high in fat, sodium, and added  sugar.  Shop around the outside edge of the grocery store. This is where you will most often find fresh fruits and vegetables, bulk grains, fresh meats, and fresh dairy. Cooking  Use low-heat cooking methods, such as baking, instead of high-heat cooking methods like deep frying.  Cook using healthy oils, such as olive, canola, or sunflower oil.  Avoid cooking with butter, cream, or high-fat meats. Meal planning  Eat meals and snacks regularly, preferably at the same times every day. Avoid going long periods of time without eating.  Eat foods that are high in fiber, such as fresh fruits, vegetables, beans, and whole grains. Talk with your dietitian about how many servings of carbs you can eat at each meal.  Eat 4-6 oz (112-168 g) of lean protein each day, such as lean meat, chicken, fish, eggs, or tofu. One ounce (oz) of lean protein is equal to: ? 1 oz (28 g) of meat, chicken, or fish. ? 1 egg. ?  cup (62 g) of tofu.  Eat some foods each day that contain healthy fats, such as avocado, nuts, seeds, and fish.   What foods should I eat? Fruits Berries. Apples. Oranges. Peaches. Apricots. Plums. Grapes. Mango. Papaya. Pomegranate. Kiwi. Cherries. Vegetables Lettuce. Spinach. Leafy greens, including kale, chard, collard greens, and mustard greens. Beets. Cauliflower. Cabbage. Broccoli. Carrots. Green beans. Tomatoes. Peppers. Onions. Cucumbers. Brussels sprouts. Grains Whole grains, such as whole-wheat or whole-grain bread, crackers, tortillas, cereal, and pasta. Unsweetened oatmeal. Quinoa. Brown or wild rice. Meats and other proteins Seafood. Poultry without skin. Lean cuts of poultry and beef. Tofu. Nuts. Seeds. Dairy Low-fat or fat-free dairy products such as milk, yogurt, and cheese. The items listed above may not be a complete list of foods and beverages you can eat. Contact a dietitian for more information. What foods should I avoid? Fruits Fruits canned with  syrup. Vegetables Canned vegetables. Frozen vegetables with butter or cream sauce. Grains Refined white flour and flour products such as bread, pasta, snack foods, and cereals. Avoid all processed foods. Meats and other proteins Fatty cuts of meat. Poultry with skin. Breaded or fried meats. Processed meat. Avoid saturated fats. Dairy Full-fat yogurt, cheese, or milk. Beverages Sweetened drinks, such as soda or iced tea. The items listed above may not be a complete list of foods and beverages you should avoid. Contact a dietitian for more information. Questions to ask a health care provider  Do I need to meet with a diabetes educator?  Do I need to meet with a dietitian?  What number can I call if I have questions?  When are the best times to check my blood glucose? Where to find more information:  American Diabetes Association: diabetes.org  Academy of Nutrition and Dietetics: www.eatright.org  National Institute of Diabetes and Digestive and Kidney Diseases: www.niddk.nih.gov  Association of Diabetes Care and Education Specialists: www.diabeteseducator.org Summary  It is important to have healthy eating   habits because your blood sugar (glucose) levels are greatly affected by what you eat and drink.  A healthy meal plan will help you control your blood glucose and maintain a healthy lifestyle.  Your health care provider may recommend that you work with a dietitian to make a meal plan that is best for you.  Keep in mind that carbohydrates (carbs) and alcohol have immediate effects on your blood glucose levels. It is important to count carbs and to use alcohol carefully. This information is not intended to replace advice given to you by your health care provider. Make sure you discuss any questions you have with your health care provider. Document Revised: 12/14/2018 Document Reviewed: 12/14/2018 Elsevier Patient Education  2021 Elsevier Inc.  

## 2020-03-22 NOTE — Progress Notes (Signed)
Assessment & Plan:  1. DM type 2 with diabetic dyslipidemia (HCC) Lab Results  Component Value Date   HGBA1C 7.9 (H) 03/22/2020   HGBA1C 6.6 08/25/2019   HGBA1C 7.2 (H) 04/12/2019    - Diabetes is not at goal of A1c < 7. - Medications: continue Metformin 1,000 mg BID and Lantus 15 units QD. Increase glipizide from 5 mg QD to BID.  Encouraged GLP instead of increasing glipizide for added benefits but patient did not want to start a new medication. - Home glucose monitoring: encouraged to monitor - Patient is currently taking a statin. Patient is taking an ACE-inhibitor/ARB.  - Instruction/counseling given: discussed foot care, discussed diet and provided printed educational material  Diabetes Health Maintenance Due  Topic Date Due  . OPHTHALMOLOGY EXAM  Never done  . HEMOGLOBIN A1C  09/22/2020  . FOOT EXAM  03/22/2021    Lab Results  Component Value Date   LABMICR 43.1 08/25/2019   LABMICR 44.9 08/26/2018   - Bayer DCA Hb A1c Waived - glipiZIDE (GLUCOTROL) 5 MG tablet; Take 1 tablet (5 mg total) by mouth 2 (two) times daily before a meal.  Dispense: 60 tablet; Refill: 2 - LANTUS SOLOSTAR 100 UNIT/ML Solostar Pen; Inject 15 Units into the skin daily.  Dispense: 15 mL; Refill: 1 - metFORMIN (GLUCOPHAGE) 1000 MG tablet; Take 1 tablet (1,000 mg total) by mouth 2 (two) times daily with a meal.  Dispense: 180 tablet; Refill: 1  2. Hypertension associated with type 2 diabetes mellitus ([place]) Well controlled on current regimen.  - CMP14+EGFR - losartan (COZAAR) 25 MG tablet; Take 1 tablet (25 mg total) by mouth daily.  Dispense: 90 tablet; Refill: 1  3. Hyperlipidemia associated with type 2 diabetes mellitus ([place]) Labs to assess. - Lipid panel - atorvastatin (LIPITOR) 40 MG tablet; Take 1 tablet (40 mg total) by mouth at bedtime.  Dispense: 90 tablet; Refill: 1  4. Iron deficiency anemia, unspecified iron deficiency anemia type Well controlled on current regimen.  - CBC with  Differential/Platelet - FEROSUL 325 (65 Fe) MG tablet; Take 1 tablet (325 mg total) by mouth daily.  Dispense: 90 tablet; Refill: 1   Return in about 3 months (around 06/22/2020) for DM.  Hendricks Limes, MSN, APRN, FNP-C [facility]  Subjective:    Patient ID: George Anthony, male    DOB: 1950-03-19, 70 y.o.   MRN: [id number]  Patient Care Team: Loman Brooklyn, FNP as PCP - General (Family Medicine) Eloise Harman, DO as Consulting Physician (Internal Medicine) Harlen Labs, MD as Referring Physician (Optometry)   Chief Complaint:  Chief Complaint  Patient presents with  . Medical Management of Chronic Issues    HPI: George Anthony is a 70 y.o. male presenting on 03/22/2020 for Medical Management of Chronic Issues  Diabetes: Patient presents for follow up of diabetes. Known diabetic complications: nephropathy. Medication compliance: yes. Current diet: in general, a "healthy" diet  . Current exercise: none. Home blood sugar records: patient does not check sugars. Is he  on ACE inhibitor or angiotensin II receptor blocker? Yes. Is he on a statin? Yes.   New complaints: None  Social history:  Relevant past medical, surgical, family and social history reviewed and updated as indicated. Interim medical history since our last visit reviewed.  Allergies and medications reviewed and updated.  DATA REVIEWED: CHART IN EPIC  ROS: Negative unless specifically indicated above in HPI.    Current Outpatient Medications:  .  acetaminophen (  TYLENOL) 500 MG tablet, Take 1,000 mg by mouth every 6 (six) hours as needed for moderate pain., Disp: , Rfl:  .  atorvastatin (LIPITOR) 40 MG tablet, TAKE 1 TABLET BY MOUTH AT BEDTIME, Disp: 30 tablet, Rfl: 0 .  B-D UF III MINI PEN NEEDLES 31G X 5 MM MISC, Use with insulin daily Dx E11.69, Disp: 100 each, Rfl: 3 .  FEROSUL 325 (65 Fe) MG tablet, Take 1 tablet (325 mg total) by mouth 3 (three) times daily. (Patient taking differently:  Take 325 mg by mouth daily.), Disp: 270 tablet, Rfl: 1 .  glipiZIDE (GLUCOTROL) 5 MG tablet, Take 1 tablet (5 mg total) by mouth daily., Disp: 90 tablet, Rfl: 1 .  glucose blood test strip, Check TID and PRN dx E11.9, Disp: 100 each, Rfl: 11 .  LANTUS SOLOSTAR 100 UNIT/ML Solostar Pen, Inject 15 Units into the skin daily., Disp: 15 mL, Rfl: 0 .  losartan (COZAAR) 25 MG tablet, Take 1 tablet by mouth once daily, Disp: 30 tablet, Rfl: 0 .  metFORMIN (GLUCOPHAGE) 1000 MG tablet, Take 1 tablet (1,000 mg total) by mouth 2 (two) times daily with a meal., Disp: 180 tablet, Rfl: 1   No Known Allergies Past Medical History:  Diagnosis Date  . Diabetes mellitus without complication ([place])   . Enlarged heart   . Hyperlipidemia   . Hypertension     Past Surgical History:  Procedure Laterality Date  . BIOPSY  09/16/2019   Procedure: BIOPSY;  Surgeon: Eloise Harman, DO;  Location: [facility];  Service: Endoscopy;;  . COLONOSCOPY WITH PROPOFOL N/A 09/16/2019   Procedure: COLONOSCOPY WITH PROPOFOL;  Surgeon: Eloise Harman, DO;  Location: [facility];  Service: Endoscopy;  Laterality: N/A;  7:30am  . POLYPECTOMY  09/16/2019   Procedure: POLYPECTOMY;  Surgeon: Eloise Harman, DO;  Location: [facility];  Service: Endoscopy;;    Social History   Socioeconomic History  . Marital status: Married    Spouse name: charlene  . Number of children: 1  . Years of education: Not on file  . Highest education level: Not on file  Occupational History    Employer: [employer]  Tobacco Use  . Smoking status: Current Every Day Smoker    Packs/day: 1.00    Years: 40.00    Pack years: 40.00    Types: Cigarettes  . Smokeless tobacco: Never Used  Vaping Use  . Vaping Use: Never used  Substance and Sexual Activity  . Alcohol use: Yes    Alcohol/week: 3.0 standard drinks    Types: 3 Cans of beer per week    Comment: drinking a couple times a month for the last year.- used to drink 2 12  packs a week  . Drug use: No  . Sexual activity: Yes  Other Topics Concern  . Not on file  Social History Narrative  . Not on file   Social Determinants of Health   Financial Resource Strain: Not on file  Food Insecurity: Not on file  Transportation Needs: Not on file  Physical Activity: Not on file  Stress: Not on file  Social Connections: Not on file  Intimate Partner Violence: Not on file        Objective:    BP 113/63   Pulse 97   Temp 97.9 F (36.6 C)   Ht 5' 7"  (1.702 m)   Wt 183 lb 3.2 oz (83.1 kg)   SpO2 99%   BMI 28.69 kg/m  Wt Readings from Last 3 Encounters:  03/22/20 183 lb 3.2 oz (83.1 kg)  08/25/19 205 lb 6.4 oz (93.2 kg)  07/14/19 205 lb (93 kg)    Physical Exam Vitals reviewed.  Constitutional:      General: He is not in acute distress.    Appearance: Normal appearance. He is overweight. He is not ill-appearing, toxic-appearing or diaphoretic.  HENT:     Head: Normocephalic and atraumatic.  Eyes:     General: No scleral icterus.       Right eye: No discharge.        Left eye: No discharge.     Conjunctiva/sclera: Conjunctivae normal.  Cardiovascular:     Rate and Rhythm: Normal rate and regular rhythm.     Heart sounds: Murmur heard.  No friction rub. No gallop.   Pulmonary:     Effort: Pulmonary effort is normal. No respiratory distress.     Breath sounds: Normal breath sounds. No stridor. No wheezing, rhonchi or rales.  Musculoskeletal:        General: Normal range of motion.     Cervical back: Normal range of motion.  Skin:    General: Skin is warm and dry.  Neurological:     Mental Status: He is alert and oriented to person, place, and time. Mental status is at baseline.  Psychiatric:        Mood and Affect: Mood normal.        Behavior: Behavior normal.        Thought Content: Thought content normal.        Judgment: Judgment normal.    Diabetic Foot Exam - Simple   Simple Foot Form Diabetic Foot exam was performed with  the following findings: Yes 03/22/2020  8:40 AM  Visual Inspection No deformities, no ulcerations, no other skin breakdown bilaterally: Yes Sensation Testing Intact to touch and monofilament testing bilaterally: Yes Pulse Check Posterior Tibialis and Dorsalis pulse intact bilaterally: Yes Comments     Lab Results  Component Value Date   TSH 1.560 08/26/2018   Lab Results  Component Value Date   WBC 18.0 (H) 08/17/2019   HGB 9.5 (L) 08/17/2019   HCT 32.4 (L) 08/17/2019   MCV 79 08/17/2019   PLT 394 08/17/2019   Lab Results  Component Value Date   NA 136 08/17/2019   K 4.6 08/17/2019   CO2 22 08/17/2019   GLUCOSE 130 (H) 08/17/2019   BUN 8 08/17/2019   CREATININE 0.56 (L) 08/17/2019   BILITOT <0.2 08/17/2019   ALKPHOS 122 (H) 08/17/2019   AST 9 08/17/2019   ALT 18 08/17/2019   PROT 7.1 08/17/2019   ALBUMIN 3.5 (L) 08/17/2019   CALCIUM 8.6 08/17/2019   Lab Results  Component Value Date   CHOL 139 08/17/2019   Lab Results  Component Value Date   HDL 48 08/17/2019   Lab Results  Component Value Date   LDLCALC 78 08/17/2019   Lab Results  Component Value Date   TRIG 60 08/17/2019   Lab Results  Component Value Date   CHOLHDL 2.9 08/17/2019   Lab Results  Component Value Date   HGBA1C 6.6 08/25/2019

## 2020-03-23 LAB — CBC WITH DIFFERENTIAL/PLATELET
Basophils Absolute: 0 10*3/uL (ref 0.0–0.2)
Basos: 0 %
EOS (ABSOLUTE): 0.1 10*3/uL (ref 0.0–0.4)
Eos: 1 %
Hematocrit: 27.5 % — ABNORMAL LOW (ref 37.5–51.0)
Hemoglobin: 8.2 g/dL — CL (ref 13.0–17.7)
Immature Grans (Abs): 0.1 10*3/uL (ref 0.0–0.1)
Immature Granulocytes: 1 %
Lymphocytes Absolute: 7.4 10*3/uL — ABNORMAL HIGH (ref 0.7–3.1)
Lymphs: 41 %
MCH: 23 pg — ABNORMAL LOW (ref 26.6–33.0)
MCHC: 29.8 g/dL — ABNORMAL LOW (ref 31.5–35.7)
MCV: 77 fL — ABNORMAL LOW (ref 79–97)
Monocytes Absolute: 1.9 10*3/uL — ABNORMAL HIGH (ref 0.1–0.9)
Monocytes: 10 %
Neutrophils Absolute: 8.4 10*3/uL — ABNORMAL HIGH (ref 1.4–7.0)
Neutrophils: 47 %
Platelets: 402 10*3/uL (ref 150–450)
RBC: 3.57 x10E6/uL — ABNORMAL LOW (ref 4.14–5.80)
RDW: 15.8 % — ABNORMAL HIGH (ref 11.6–15.4)
WBC: 17.9 10*3/uL — ABNORMAL HIGH (ref 3.4–10.8)

## 2020-03-23 LAB — LIPID PANEL
Chol/HDL Ratio: 3.2 ratio (ref 0.0–5.0)
Cholesterol, Total: 133 mg/dL (ref 100–199)
HDL: 41 mg/dL (ref >39)
LDL Chol Calc (NIH): 78 mg/dL (ref 0–99)
Triglycerides: 66 mg/dL (ref 0–149)
VLDL Cholesterol Cal: 14 mg/dL (ref 5–40)

## 2020-03-23 LAB — CMP14+EGFR
ALT: 42 IU/L (ref 0–44)
AST: 24 IU/L (ref 0–40)
Albumin/Globulin Ratio: 0.8 — ABNORMAL LOW (ref 1.2–2.2)
Albumin: 3.1 g/dL — ABNORMAL LOW (ref 3.8–4.8)
Alkaline Phosphatase: 149 IU/L — ABNORMAL HIGH (ref 44–121)
BUN/Creatinine Ratio: 19 (ref 10–24)
BUN: 12 mg/dL (ref 8–27)
Bilirubin Total: 0.2 mg/dL (ref 0.0–1.2)
CO2: 23 mmol/L (ref 20–29)
Calcium: 9.6 mg/dL (ref 8.6–10.2)
Chloride: 92 mmol/L — ABNORMAL LOW (ref 96–106)
Creatinine, Ser: 0.62 mg/dL — ABNORMAL LOW (ref 0.76–1.27)
Globulin, Total: 3.9 g/dL (ref 1.5–4.5)
Glucose: 154 mg/dL — ABNORMAL HIGH (ref 65–99)
Potassium: 4.7 mmol/L (ref 3.5–5.2)
Sodium: 132 mmol/L — ABNORMAL LOW (ref 134–144)
Total Protein: 7 g/dL (ref 6.0–8.5)
eGFR: 103 mL/min/{1.73_m2} (ref >59)

## 2020-04-27 ENCOUNTER — Telehealth: Payer: Self-pay

## 2020-04-27 DIAGNOSIS — E1169 Type 2 diabetes mellitus with other specified complication: Secondary | ICD-10-CM

## 2020-04-27 DIAGNOSIS — E785 Hyperlipidemia, unspecified: Secondary | ICD-10-CM

## 2020-04-30 NOTE — Telephone Encounter (Signed)
Spoke with Lenna Gilford ( back up for [employer] pen-sample given to hold pt over until Lantus PA can get done and approved.

## 2020-05-01 NOTE — Telephone Encounter (Signed)
Awaiting for PA to be received

## 2020-05-03 NOTE — Telephone Encounter (Signed)
Wife stopped by to check on PA and note was read to her. Said she would check back on Monday 4/18

## 2020-05-07 NOTE — Telephone Encounter (Signed)
Status Sent to [employer]

## 2020-05-07 NOTE — Telephone Encounter (Signed)
PA started   Key: [facility] - Rx #: [id number]  Status Additional Information Required  Awaiting information

## 2020-05-09 NOTE — Telephone Encounter (Signed)
Outcome Denied on April 19  Drug Lantus SoloStar 100UNIT/ML pen-injectors Form Librarian, academic Form (CB)

## 2020-05-10 ENCOUNTER — Telehealth: Payer: Self-pay

## 2020-05-10 MED ORDER — INSULIN GLARGINE-YFGN 100 UNIT/ML ~~LOC~~ SOLN: 15.0000 [IU] | Freq: Every day | SUBCUTANEOUS | 1 refills | Status: DC

## 2020-05-10 NOTE — Telephone Encounter (Signed)
Lmtcb.

## 2020-05-10 NOTE — Addendum Note (Signed)
Addended by: Hendricks Limes F on: 05/10/2020 11:21 AM   Modules accepted: Orders

## 2020-05-10 NOTE — Telephone Encounter (Signed)
Please let patient know that insurance prefers [facility] and that this has been prescribed. Lantus D/C'd.

## 2020-05-10 NOTE — Telephone Encounter (Signed)
Received a fax also about the denial.  This medication is approved when the interchangeable bio similar product Semglee has been tried and did not work or was not tolerated.

## 2020-05-10 NOTE — Telephone Encounter (Signed)
[facility] is wanting ok to change to pen from vial, can this be sent electronically for future refills

## 2020-05-10 NOTE — Telephone Encounter (Signed)
Wife aware and verbalizes understanding.  

## 2020-05-11 NOTE — Telephone Encounter (Signed)
Needs authorization to change to pen, instead of vial

## 2020-05-11 NOTE — Telephone Encounter (Signed)
Is does not look like what I sent is a pen. It is a vial. What would they like me to send?

## 2020-05-11 NOTE — Telephone Encounter (Signed)
Verbal given to pharmacy to change to pen instead of vial per provider

## 2020-06-01 ENCOUNTER — Emergency Department (HOSPITAL_COMMUNITY): Payer: BC Managed Care – PPO

## 2020-06-01 ENCOUNTER — Other Ambulatory Visit: Payer: Self-pay

## 2020-06-01 ENCOUNTER — Encounter (HOSPITAL_COMMUNITY): Payer: Self-pay | Admitting: *Deleted

## 2020-06-01 ENCOUNTER — Observation Stay (HOSPITAL_COMMUNITY)
Admission: EM | Admit: 2020-06-01 | Discharge: 2020-06-02 | Disposition: A | Discharge status: Home / Self Care | Payer: BC Managed Care – PPO | Attending: Family Medicine | Admitting: Family Medicine

## 2020-06-01 DIAGNOSIS — I82461 Acute embolism and thrombosis of right calf muscular vein: Secondary | ICD-10-CM | POA: Diagnosis not present

## 2020-06-01 DIAGNOSIS — E119 Type 2 diabetes mellitus without complications: Secondary | ICD-10-CM | POA: Diagnosis not present

## 2020-06-01 DIAGNOSIS — E11649 Type 2 diabetes mellitus with hypoglycemia without coma: Secondary | ICD-10-CM | POA: Diagnosis present

## 2020-06-01 DIAGNOSIS — D649 Anemia, unspecified: Secondary | ICD-10-CM | POA: Diagnosis not present

## 2020-06-01 DIAGNOSIS — E1169 Type 2 diabetes mellitus with other specified complication: Secondary | ICD-10-CM | POA: Diagnosis not present

## 2020-06-01 DIAGNOSIS — I82491 Acute embolism and thrombosis of other specified deep vein of right lower extremity: Secondary | ICD-10-CM | POA: Diagnosis not present

## 2020-06-01 DIAGNOSIS — R600 Localized edema: Secondary | ICD-10-CM | POA: Diagnosis not present

## 2020-06-01 DIAGNOSIS — I82409 Acute embolism and thrombosis of unspecified deep veins of unspecified lower extremity: Secondary | ICD-10-CM | POA: Diagnosis present

## 2020-06-01 DIAGNOSIS — I82441 Acute embolism and thrombosis of right tibial vein: Secondary | ICD-10-CM | POA: Diagnosis not present

## 2020-06-01 DIAGNOSIS — Z7984 Long term (current) use of oral hypoglycemic drugs: Secondary | ICD-10-CM | POA: Diagnosis not present

## 2020-06-01 DIAGNOSIS — Z79899 Other long term (current) drug therapy: Secondary | ICD-10-CM | POA: Insufficient documentation

## 2020-06-01 DIAGNOSIS — M79661 Pain in right lower leg: Secondary | ICD-10-CM | POA: Diagnosis not present

## 2020-06-01 DIAGNOSIS — R000 Tachycardia, unspecified: Secondary | ICD-10-CM | POA: Diagnosis not present

## 2020-06-01 DIAGNOSIS — E785 Hyperlipidemia, unspecified: Secondary | ICD-10-CM | POA: Diagnosis not present

## 2020-06-01 DIAGNOSIS — Z794 Long term (current) use of insulin: Secondary | ICD-10-CM | POA: Diagnosis not present

## 2020-06-01 DIAGNOSIS — I4891 Unspecified atrial fibrillation: Secondary | ICD-10-CM | POA: Diagnosis not present

## 2020-06-01 DIAGNOSIS — K760 Fatty (change of) liver, not elsewhere classified: Secondary | ICD-10-CM | POA: Diagnosis present

## 2020-06-01 DIAGNOSIS — Z87898 Personal history of other specified conditions: Secondary | ICD-10-CM | POA: Insufficient documentation

## 2020-06-01 DIAGNOSIS — Z20822 Contact with and (suspected) exposure to covid-19: Secondary | ICD-10-CM | POA: Insufficient documentation

## 2020-06-01 DIAGNOSIS — I824Z1 Acute embolism and thrombosis of unspecified deep veins of right distal lower extremity: Secondary | ICD-10-CM | POA: Diagnosis not present

## 2020-06-01 DIAGNOSIS — I10 Essential (primary) hypertension: Secondary | ICD-10-CM | POA: Insufficient documentation

## 2020-06-01 DIAGNOSIS — M7989 Other specified soft tissue disorders: Secondary | ICD-10-CM | POA: Diagnosis not present

## 2020-06-01 DIAGNOSIS — K700 Alcoholic fatty liver: Secondary | ICD-10-CM | POA: Insufficient documentation

## 2020-06-01 DIAGNOSIS — F17210 Nicotine dependence, cigarettes, uncomplicated: Secondary | ICD-10-CM | POA: Diagnosis present

## 2020-06-01 DIAGNOSIS — E1159 Type 2 diabetes mellitus with other circulatory complications: Secondary | ICD-10-CM | POA: Diagnosis present

## 2020-06-01 DIAGNOSIS — F1010 Alcohol abuse, uncomplicated: Secondary | ICD-10-CM | POA: Diagnosis present

## 2020-06-01 DIAGNOSIS — I499 Cardiac arrhythmia, unspecified: Secondary | ICD-10-CM | POA: Diagnosis not present

## 2020-06-01 DIAGNOSIS — I82401 Acute embolism and thrombosis of unspecified deep veins of right lower extremity: Secondary | ICD-10-CM | POA: Diagnosis not present

## 2020-06-01 LAB — CBC
HCT: 25.3 % — ABNORMAL LOW (ref 39.0–52.0)
Hemoglobin: 7 g/dL — ABNORMAL LOW (ref 13.0–17.0)
MCH: 22.2 pg — ABNORMAL LOW (ref 26.0–34.0)
MCHC: 27.7 g/dL — ABNORMAL LOW (ref 30.0–36.0)
MCV: 80.3 fL (ref 80.0–100.0)
Platelets: 438 10*3/uL — ABNORMAL HIGH (ref 150–400)
RBC: 3.15 MIL/uL — ABNORMAL LOW (ref 4.22–5.81)
RDW: 17.3 % — ABNORMAL HIGH (ref 11.5–15.5)
WBC: 13 10*3/uL — ABNORMAL HIGH (ref 4.0–10.5)
nRBC: 0 % (ref 0.0–0.2)

## 2020-06-01 LAB — IRON AND TIBC
Iron: 21 ug/dL — ABNORMAL LOW (ref 45–182)
Iron: 14 ug/dL — ABNORMAL LOW (ref 45–182)
Saturation Ratios: 7 % — ABNORMAL LOW (ref 17.9–39.5)
Saturation Ratios: 5 % — ABNORMAL LOW (ref 17.9–39.5)
TIBC: 280 ug/dL (ref 250–450)
TIBC: 274 ug/dL (ref 250–450)
UIBC: 259 ug/dL
UIBC: 260 ug/dL

## 2020-06-01 LAB — TROPONIN I (HIGH SENSITIVITY)
Troponin I (High Sensitivity): 6 ng/L (ref <18)
Troponin I (High Sensitivity): 6 ng/L (ref <18)

## 2020-06-01 LAB — BASIC METABOLIC PANEL
Anion gap: 10 (ref 5–15)
BUN: 11 mg/dL (ref 8–23)
CO2: 26 mmol/L (ref 22–32)
Calcium: 8.8 mg/dL — ABNORMAL LOW (ref 8.9–10.3)
Chloride: 96 mmol/L — ABNORMAL LOW (ref 98–111)
Creatinine, Ser: 0.71 mg/dL (ref 0.61–1.24)
GFR, Estimated: >60 mL/min (ref >60)
Glucose, Bld: 177 mg/dL — ABNORMAL HIGH (ref 70–99)
Potassium: 3.7 mmol/L (ref 3.5–5.1)
Sodium: 132 mmol/L — ABNORMAL LOW (ref 135–145)

## 2020-06-01 LAB — PREPARE RBC (CROSSMATCH)

## 2020-06-01 LAB — PROTIME-INR
INR: 1.2 (ref 0.8–1.2)
Prothrombin Time: 14.7 seconds (ref 11.4–15.2)

## 2020-06-01 LAB — POC OCCULT BLOOD, ED: Fecal Occult Bld: NEGATIVE

## 2020-06-01 LAB — RETICULOCYTES
Immature Retic Fract: 33.9 % — ABNORMAL HIGH (ref 2.3–15.9)
RBC.: 2.99 MIL/uL — ABNORMAL LOW (ref 4.22–5.81)
Retic Count, Absolute: 54.1 10*3/uL (ref 19.0–186.0)
Retic Ct Pct: 1.8 % (ref 0.4–3.1)

## 2020-06-01 LAB — APTT: aPTT: 49 seconds — ABNORMAL HIGH (ref 24–36)

## 2020-06-01 LAB — BRAIN NATRIURETIC PEPTIDE: B Natriuretic Peptide: 82 pg/mL (ref 0.0–100.0)

## 2020-06-01 LAB — ABO/RH: ABO/RH(D): O POS

## 2020-06-01 LAB — FOLATE: Folate: 3.9 ng/mL — ABNORMAL LOW (ref >5.9)

## 2020-06-01 LAB — FERRITIN: Ferritin: 103 ng/mL (ref 24–336)

## 2020-06-01 LAB — VITAMIN B12: Vitamin B-12: 601 pg/mL (ref 180–914)

## 2020-06-01 LAB — LACTIC ACID, PLASMA: Lactic Acid, Venous: 0.9 mmol/L (ref 0.5–1.9)

## 2020-06-01 MED ORDER — FUROSEMIDE 10 MG/ML IJ SOLN
20.0000 mg | Freq: Once | INTRAMUSCULAR | Status: AC
  Administered 2020-06-01: 20 mg via INTRAVENOUS
  Filled 2020-06-01: qty 2

## 2020-06-01 MED ORDER — INSULIN GLARGINE 100 UNIT/ML ~~LOC~~ SOLN
10.0000 [IU] | Freq: Every day | SUBCUTANEOUS | Status: DC
  Administered 2020-06-02: 10 [IU] via SUBCUTANEOUS
  Filled 2020-06-01 (×6): qty 0.1

## 2020-06-01 MED ORDER — SODIUM CHLORIDE 0.9% IV SOLUTION
Freq: Once | INTRAVENOUS | Status: AC
  Administered 2020-06-01: 10 mL/h via INTRAVENOUS

## 2020-06-01 MED ORDER — LOSARTAN POTASSIUM 50 MG PO TABS
25.0000 mg | ORAL_TABLET | Freq: Every day | ORAL | Status: DC
  Administered 2020-06-02: 25 mg via ORAL
  Filled 2020-06-01: qty 1

## 2020-06-01 MED ORDER — ATORVASTATIN CALCIUM 40 MG PO TABS
40.0000 mg | ORAL_TABLET | Freq: Every day | ORAL | Status: DC
  Administered 2020-06-01: 40 mg via ORAL
  Filled 2020-06-01: qty 1

## 2020-06-01 MED ORDER — ENOXAPARIN SODIUM 80 MG/0.8ML IJ SOSY
80.0000 mg | PREFILLED_SYRINGE | Freq: Two times a day (BID) | INTRAMUSCULAR | Status: DC
  Administered 2020-06-02: 80 mg via SUBCUTANEOUS
  Filled 2020-06-01: qty 0.8

## 2020-06-01 MED ORDER — GLIPIZIDE 5 MG PO TABS
5.0000 mg | ORAL_TABLET | Freq: Two times a day (BID) | ORAL | Status: DC
  Administered 2020-06-02: 5 mg via ORAL
  Filled 2020-06-01: qty 1

## 2020-06-01 NOTE — H&P (Signed)
TRH H&P   Patient Demographics:    George Anthony, is a 70 y.o. male  MRN: [id number]   DOB - 1950-11-24  Admit Date - 06/01/2020  Outpatient Primary MD for the patient is Loman Brooklyn, FNP  Referring MD/NP/PA: PA Garette  Patient coming from: Home  Chief Complaint  Patient presents with  . Leg Pain      HPI:    George Anthony  is a 70 y.o. male, with past medical history of hypertension, hyperlipidemia, history of alcohol use disorder, iron deficiency anemia, on iron supplements, type 2 diabetes mellitus, patient presents to ED secondary to complaints of right lower extremity pain, patient denies any leg trauma, recent leg travel, any history of DVT/PE, denies any chest pain, denies any hemoptysis or shortness of breath, he denies any melena, bright red blood per rectum, any coffee-ground emesis, patient with known history of chronic anemia, on iron supplements. - in ED his venous Doppler significant for right lower extremity DVT, hemoglobin was 7, he was Hemoccult negative, [facility] consulted to admit.    Review of systems:    In addition to the HPI above,  No Fever-chills, generalized weakness and fatigue No Headache, No changes with Vision or hearing, No problems swallowing food or Liquids, No Chest pain, Cough or Shortness of Breath, No Abdominal pain, No Nausea or Vommitting, Bowel movements are regular, No Blood in stool or Urine, No dysuria, No new skin rashes or bruises, Complains of right lower extremity pain and swelling. No new weakness, tingling, numbness in any extremity, No recent weight gain or loss, No polyuria, polydypsia or polyphagia, No significant Mental Stressors.  A full 10 point Review of Systems was done, except as stated above, all other Review of Systems were negative.   With Past History of the following :    Past Medical History:   Diagnosis Date  . Diabetes mellitus without complication ([place])   . Enlarged heart   . Hyperlipidemia   . Hypertension       Past Surgical History:  Procedure Laterality Date  . BIOPSY  09/16/2019   Procedure: BIOPSY;  Surgeon: Eloise Harman, DO;  Location: [facility];  Service: Endoscopy;;  . COLONOSCOPY WITH PROPOFOL N/A 09/16/2019   Procedure: COLONOSCOPY WITH PROPOFOL;  Surgeon: Eloise Harman, DO;  Location: [facility];  Service: Endoscopy;  Laterality: N/A;  7:30am  . POLYPECTOMY  09/16/2019   Procedure: POLYPECTOMY;  Surgeon: Eloise Harman, DO;  Location: [facility];  Service: Endoscopy;;      Social History:     Social History   Tobacco Use  . Smoking status: Current Every Day Smoker    Packs/day: 1.00    Years: 40.00    Pack years: 40.00    Types: Cigarettes  . Smokeless tobacco: Never Used  Substance Use Topics  . Alcohol use: Yes  Alcohol/week: 3.0 standard drinks    Types: 3 Cans of beer per week    Comment: drinking a couple times a month for the last year.- used to drink 2 12 packs a week      Family History :     Family History  Problem Relation Age of Onset  . Emphysema Mother   . Drug abuse Sister   . Colon cancer Neg Hx       Home Medications:   Prior to Admission medications   Medication Sig Start Date End Date Taking? Authorizing Provider  acetaminophen (TYLENOL) 500 MG tablet Take 1,000 mg by mouth every 6 (six) hours as needed for moderate pain.    [provider]  atorvastatin (LIPITOR) 40 MG tablet Take 1 tablet (40 mg total) by mouth at bedtime. 03/22/20   Loman Brooklyn, FNP  B-D UF III MINI PEN NEEDLES 31G X 5 MM MISC Use with insulin daily Dx E11.69 10/20/19   Loman Brooklyn, FNP  FEROSUL 325 (65 Fe) MG tablet Take 1 tablet (325 mg total) by mouth daily. 03/22/20   Loman Brooklyn, FNP  glipiZIDE (GLUCOTROL) 5 MG tablet Take 1 tablet (5 mg total) by mouth 2 (two) times daily before a meal. 03/22/20   Loman Brooklyn, FNP  glucose blood test strip Check TID and PRN dx E11.9 02/17/19   Rakes, Connye Burkitt, FNP  Insulin Glargine-yfgn (SEMGLEE, YFGN,) 100 UNIT/ML SOLN Inject 15 Units into the skin daily. 05/10/20   Loman Brooklyn, FNP  losartan (COZAAR) 25 MG tablet Take 1 tablet (25 mg total) by mouth daily. 03/22/20   Loman Brooklyn, FNP  metFORMIN (GLUCOPHAGE) 1000 MG tablet Take 1 tablet (1,000 mg total) by mouth 2 (two) times daily with a meal. 03/22/20   Hendricks Limes F, FNP     Allergies:    No Known Allergies   Physical Exam:   Vitals  Blood pressure (!) 152/73, pulse 84, temperature 98.5 F (36.9 C), temperature source Oral, resp. rate (!) 23, height 5\' 7"  (1.702 m), weight 84.9 kg, SpO2 94 %.   1. General well developed male  lying in bed in NAD,    2. Normal affect and insight, Not Suicidal or Homicidal, Awake Alert, Oriented X 3.  3. No F.N deficits, ALL C.Nerves Intact, Strength 5/5 all 4 extremities, Sensation intact all 4 extremities, Plantars down going.  4. Ears and Eyes appear Normal, Conjunctivae clear, PERRLA. Moist Oral Mucosa.  5. Supple Neck, No JVD, No cervical lymphadenopathy appriciated, No Carotid Bruits.  6. Symmetrical Chest wall movement, Good air movement bilaterally, CTAB.  7. RRR, No Gallops, Rubs or Murmurs, No Parasternal Heave.  8. Positive Bowel Sounds, Abdomen Soft, No tenderness, No organomegaly appriciated,No rebound -guarding or rigidity.  9.  No Cyanosis, Normal Skin Turgor, No Skin Rash or Bruise.  10. Good muscle tone,  joints appear normal , no effusions, Normal ROM.with B?L lower ext edema, Right >left  11. No Palpable Lymph Nodes in Neck or Axillae   Data Review:    CBC Recent Labs  Lab 06/01/20 1258  WBC 13.0*  HGB 7.0*  HCT 25.3*  PLT 438*  MCV 80.3  MCH 22.2*  MCHC 27.7*  RDW 17.3*   ------------------------------------------------------------------------------------------------------------------  Chemistries  Recent  Labs  Lab 06/01/20 1258  NA 132*  K 3.7  CL 96*  CO2 26  GLUCOSE 177*  BUN 11  CREATININE 0.71  CALCIUM 8.8*   ------------------------------------------------------------------------------------------------------------------ estimated creatinine  clearance is 90.7 mL/min (by C-G formula based on SCr of 0.71 mg/dL). ------------------------------------------------------------------------------------------------------------------ No results for input(s): TSH, T4TOTAL, T3FREE, THYROIDAB in the last 72 hours.  Invalid input(s): FREET3  Coagulation profile Recent Labs  Lab 06/01/20 1258  INR 1.2   ------------------------------------------------------------------------------------------------------------------- No results for input(s): DDIMER in the last 72 hours. -------------------------------------------------------------------------------------------------------------------  Cardiac Enzymes No results for input(s): CKMB, TROPONINI, MYOGLOBIN in the last 168 hours.  Invalid input(s): CK ------------------------------------------------------------------------------------------------------------------    Component Value Date/Time   BNP 82.0 06/01/2020 1258     ---------------------------------------------------------------------------------------------------------------  Urinalysis    Component Value Date/Time   COLORURINE YELLOW 11/19/2011 0158   APPEARANCEUR CLEAR 11/19/2011 0158   LABSPEC >1.030 (H) 11/19/2011 0158   PHURINE 6.0 11/19/2011 0158   GLUCOSEU NEGATIVE 11/19/2011 0158   HGBUR MODERATE (A) 11/19/2011 0158   BILIRUBINUR MODERATE (A) 11/19/2011 0158   KETONESUR 40 (A) 11/19/2011 0158   PROTEINUR 30 (A) 11/19/2011 0158   UROBILINOGEN 2.0 (H) 11/19/2011 0158   NITRITE NEGATIVE 11/19/2011 0158   LEUKOCYTESUR NEGATIVE 11/19/2011 0158    ----------------------------------------------------------------------------------------------------------------    Imaging Results:    DG Chest 2 View  Result Date: 06/01/2020 CLINICAL DATA:  Lower extremity edema EXAM: CHEST - 2 VIEW COMPARISON:  November 18, 2011 FINDINGS: There is generalized interstitial thickening. No edema or airspace opacity. Heart is upper normal in size with mild pulmonary venous hypertension. No adenopathy. No bone lesions. IMPRESSION: Heart upper normal in size with a degree of pulmonary venous hypertension. Interstitial thickening likely reflects underlying chronic bronchitis. A slight degree of interstitial edema is difficult to exclude. No airspace consolidation. No adenopathy. Electronically Signed   By: Lowella Grip III M.D.   On: 06/01/2020 13:38   US Venous Img Lower Unilateral Right  Result Date: 06/01/2020 CLINICAL DATA:  70 year old male with right lower extremity swelling for 2 days. EXAM: RIGHT LOWER EXTREMITY VENOUS DOPPLER ULTRASOUND TECHNIQUE: Gray-scale sonography with graded compression, as well as color Doppler and duplex ultrasound were performed to evaluate the right lower extremity deep venous systems from the level of the common femoral vein and including the common femoral, femoral, profunda femoral, popliteal and calf veins including the posterior tibial, peroneal and gastrocnemius veins when visible. Spectral Doppler was utilized to evaluate flow at rest and with distal augmentation maneuvers in the common femoral, femoral and popliteal veins. The contralateral common femoral vein was also evaluated for comparison. COMPARISON:  None. FINDINGS: RIGHT LOWER EXTREMITY Common Femoral Vein: No evidence of thrombus. Normal compressibility, respiratory phasicity and response to augmentation. Central Greater Saphenous Vein: No evidence of thrombus. Normal compressibility and flow on color Doppler imaging. Central Profunda Femoral Vein: No evidence of thrombus. Normal compressibility and flow on color Doppler imaging. Femoral Vein: No evidence of thrombus. Normal  compressibility, respiratory phasicity and response to augmentation. Popliteal Vein: No evidence of thrombus. Normal compressibility, respiratory phasicity and response to augmentation. Calf Veins: Acute occlusive thrombus in the posterior tibial, anterior tibial, and peroneal veins. Venous Reflux:  None. Other Findings:  None. LEFT LOWER EXTREMITY Common Femoral Vein: No evidence of thrombus. Normal compressibility, respiratory phasicity and response to augmentation. IMPRESSION: Acute right lower extremity deep vein thrombosis limited to the calf veins. Ruthann Cancer, MD [facility] Electronically Signed   By: Ruthann Cancer MD   On: 06/01/2020 13:44    My personal review of EKG: Rhythm sinus Tachycardia , Rate  102/min, QTc 445 .   Assessment & Plan:    Active Problems:  Alcohol abuse   Fatty liver   Cigarette nicotine dependence without complication   DM type 2 with diabetic dyslipidemia ([place])   Hypertension associated with type 2 diabetes mellitus ([place])   Anemia   Acute DVT (deep venous thrombosis) ([place])    Acute DVT -Appears to be unprovoked, he will be kept on Lovenox full dose overnight, can be transitioned to Eliquis in a.m. if hemoglobin remains stable.  Normocytic anemia -Acute on chronic, baseline around 8, it is 7 today, will transfuse 1 unit PRBC especially he will be started on full anticoagulation. -We will check anemia panel,history of  alcohol abuse( but nothing significant over last year or so, which was likely contributing to it). -He is Hemoccult negative  Diabetes mellitus, type II, insulin-dependent -Resume Lantus at a lower dose 15>10 units, will add insulin sliding scale, hold metformin and continue with glipizide  Hyperlipidemia -Continue with atorvastatin  Hypertension -Continue home medications  DVT Prophylaxis  Lovenox Full dose  AM Labs Ordered, also please review Full Orders  Family  Communication: Admission, patients condition and plan of care including tests being ordered have been discussed with the patient  who indicate understanding and agree with the plan and Code Status.  Code Status Full  Likely DC to  Home  Condition GUARDED    Consults called: none    Admission status: observation    Time spent in minutes : 60 minutes   Phillips Climes M.D on 06/01/2020 at 4:37 PM   [facility] - Office  [phone]

## 2020-06-01 NOTE — ED Provider Notes (Addendum)
[facility] EMERGENCY DEPARTMENT Provider Note   CSN: [id number] Arrival date & time: 06/01/20  1232     History Chief Complaint  Patient presents with  . Leg Pain    George Anthony is a 70 y.o. male with PMH of HTN, HLD, alcohol use disorder, iron deficiency anemia on FeroSul 325 mg daily, and type II DM followed by George Anthony who presents the ED sent from urgent care for 3-day history of atraumatic right lower leg swelling and pain.  On my examination, patient is resting comfortably in bed.  He states that he has atraumatic right lower leg swelling and pain.  He denies any recent surgeries, immobilization, travel, history of clots or clotting disorder, or hormone use.  He states that he is typically ambulatory without difficulty or assistive devices, but has had difficulty ambulating since onset of his lower leg swelling.  He works in [employer].  He states that he has been taking all of his medications, including his iron supplements, as directed.  Patient reports that he has been feeling mildly weak at times, but nothing particularly significant.  Denies syncope.  He always has mild lower extremity edema, denies having not seen a cardiologist or had echocardiogram performed.  He has a cough, but because of his typical smoker's cough.  He has a 50-pack-year smoking history.  He denies any fevers, chills, chest pain or shortness of breath, palpitations, abdominal pain, nausea or vomiting, or other symptoms.  He continues to drink alcohol and smoke cigarettes.  Denies any known history of cirrhosis.  HPI     Past Medical History:  Diagnosis Date  . Diabetes mellitus without complication ([place])   . Enlarged heart   . Hyperlipidemia   . Hypertension     Patient Active Problem List   Diagnosis Date Noted  . Acute DVT (deep venous thrombosis) ([place]) 06/01/2020  . Anemia 07/14/2019  . Positive colorectal cancer screening using Cologuard test 07/14/2019  .  Constipation 07/14/2019  . Hyperlipidemia associated with type 2 diabetes mellitus ([place]) 08/26/2018  . Hypertension associated with type 2 diabetes mellitus ([place]) 08/26/2018  . Cigarette nicotine dependence without complication 07/20/1599  . Primary osteoarthritis 01/03/2016  . Allergic rhinitis 06/22/2014  . DM type 2 with diabetic dyslipidemia ([place]) 09/15/2013  . Alcohol abuse 11/19/2011  . Fatty liver 11/19/2011    Past Surgical History:  Procedure Laterality Date  . BIOPSY  09/16/2019   Procedure: BIOPSY;  Surgeon: Eloise Harman, DO;  Location: [facility];  Service: Endoscopy;;  . COLONOSCOPY WITH PROPOFOL N/A 09/16/2019   Procedure: COLONOSCOPY WITH PROPOFOL;  Surgeon: Eloise Harman, DO;  Location: [facility];  Service: Endoscopy;  Laterality: N/A;  7:30am  . POLYPECTOMY  09/16/2019   Procedure: POLYPECTOMY;  Surgeon: Eloise Harman, DO;  Location: [facility];  Service: Endoscopy;;       Family History  Problem Relation Age of Onset  . Emphysema Mother   . Drug abuse Sister   . Colon cancer Neg Hx     Social History   Tobacco Use  . Smoking status: Current Every Day Smoker    Packs/day: 1.00    Years: 40.00    Pack years: 40.00    Types: Cigarettes  . Smokeless tobacco: Never Used  Vaping Use  . Vaping Use: Never used  Substance Use Topics  . Alcohol use: Yes    Alcohol/week: 3.0 standard drinks    Types: 3 Cans of beer per week  Comment: drinking a couple times a month for the last year.- used to drink 2 12 packs a week  . Drug use: No    Home Medications Prior to Admission medications   Medication Sig Start Date End Date Taking? Authorizing Provider  acetaminophen (TYLENOL) 500 MG tablet Take 1,000 mg by mouth every 6 (six) hours as needed for moderate pain.   Yes [provider]  atorvastatin (LIPITOR) 40 MG tablet Take 1 tablet (40 mg total) by mouth at bedtime. 03/22/20  Yes Loman Brooklyn, FNP  glipiZIDE (GLUCOTROL) 5 MG  tablet Take 1 tablet (5 mg total) by mouth 2 (two) times daily before a meal. 03/22/20  Yes Hendricks Limes F, FNP  Insulin Glargine-yfgn (SEMGLEE, YFGN,) 100 UNIT/ML SOLN Inject 15 Units into the skin daily. 05/10/20  Yes Loman Brooklyn, FNP  losartan (COZAAR) 25 MG tablet Take 1 tablet (25 mg total) by mouth daily. 03/22/20  Yes Hendricks Limes F, FNP  metFORMIN (GLUCOPHAGE) 1000 MG tablet Take 1 tablet (1,000 mg total) by mouth 2 (two) times daily with a meal. 03/22/20  Yes Loman Brooklyn, FNP  apixaban (ELIQUIS) 5 MG TABS tablet Take 2 tablets (10 mg total) by mouth 2 (two) times daily for 7 days. 06/02/20 06/09/20  Shahmehdi, Valeria Batman, MD  apixaban (ELIQUIS) 5 MG TABS tablet Take 1 tablet (5 mg total) by mouth 2 (two) times daily. 06/09/20 09/07/20  Deatra James, MD  B-D UF III MINI PEN NEEDLES 31G X 5 MM MISC Use with insulin daily Dx E11.69 10/20/19   Loman Brooklyn, FNP  ferrous sulfate 325 (65 FE) MG tablet Take 1 tablet (325 mg total) by mouth 2 (two) times daily with a meal. 06/03/20 07/03/20  Shahmehdi, Valeria Batman, MD  glucose blood test strip Check TID and PRN dx E11.9 02/17/19   Rakes, Connye Burkitt, FNP    Allergies    Patient has no known allergies.  Review of Systems   Review of Systems  All other systems reviewed and are negative.   Physical Exam Updated Vital Signs BP 133/68 (BP Location: Left Arm)   Pulse 83   Temp 97.6 F (36.4 C) (Oral)   Resp 16   Ht 5\' 7"  (1.702 m)   Wt 84.9 kg   SpO2 95%   BMI 29.33 kg/m   Physical Exam Vitals and nursing note reviewed. Exam conducted with a chaperone present.  Constitutional:      Appearance: He is not toxic-appearing.  HENT:     Head: Normocephalic and atraumatic.  Eyes:     General: No scleral icterus.    Conjunctiva/sclera: Conjunctivae normal.  Cardiovascular:     Rate and Rhythm: Normal rate and regular rhythm.     Pulses: Normal pulses.     Heart sounds: Normal heart sounds.  Pulmonary:     Effort: Pulmonary effort is  normal. No respiratory distress.     Breath sounds: Normal breath sounds.  Abdominal:     Tenderness: There is no abdominal tenderness.     Comments: Mildly protuberant, but soft, nontender.  No obvious organomegaly.  Genitourinary:    Comments: No obvious rectal bleeding.  Digital exam unremarkable.  Nonthrombosed hemorrhoid noted. Musculoskeletal:        General: Swelling and tenderness present. Normal range of motion.     Right lower leg: Edema present.     Left lower leg: Edema present.     Comments: Patient with bilateral lower extremity edema, but right  lower leg is significantly more edematous and swollen in area of calf.  1+ pitting edema on the left, 3+3 edema on the right.  Right calf is swollen, red, and tender.  Peripheral pulses are intact and symmetric.  Sensation intact throughout.  Patient with good range of motion.  No upper leg swelling or tenderness.  Skin:    General: Skin is dry.  Neurological:     Mental Status: He is alert and oriented to person, place, and time.     GCS: GCS eye subscore is 4. GCS verbal subscore is 5. GCS motor subscore is 6.  Psychiatric:        Mood and Affect: Mood normal.        Behavior: Behavior normal.        Thought Content: Thought content normal.     ED Results / Procedures / Treatments   Labs (all labs ordered are listed, but only abnormal results are displayed) Labs Reviewed  BASIC METABOLIC PANEL - Abnormal; Notable for the following components:      Result Value   Sodium 132 (*)    Chloride 96 (*)    Glucose, Bld 177 (*)    Calcium 8.8 (*)    All other components within normal limits  CBC - Abnormal; Notable for the following components:   WBC 13.0 (*)    RBC 3.15 (*)    Hemoglobin 7.0 (*)    HCT 25.3 (*)    MCH 22.2 (*)    MCHC 27.7 (*)    RDW 17.3 (*)    Platelets 438 (*)    All other components within normal limits  APTT - Abnormal; Notable for the following components:   aPTT 49 (*)    All other components  within normal limits  IRON AND TIBC - Abnormal; Notable for the following components:   Iron 21 (*)    Saturation Ratios 7 (*)    All other components within normal limits  FOLATE - Abnormal; Notable for the following components:   Folate 3.9 (*)    All other components within normal limits  IRON AND TIBC - Abnormal; Notable for the following components:   Iron 14 (*)    Saturation Ratios 5 (*)    All other components within normal limits  RETICULOCYTES - Abnormal; Notable for the following components:   RBC. 2.99 (*)    Immature Retic Fract 33.9 (*)    All other components within normal limits  CBC - Abnormal; Notable for the following components:   WBC 12.4 (*)    RBC 3.38 (*)    Hemoglobin 7.6 (*)    HCT 27.2 (*)    MCH 22.5 (*)    MCHC 27.9 (*)    RDW 17.2 (*)    Platelets 452 (*)    All other components within normal limits  BASIC METABOLIC PANEL - Abnormal; Notable for the following components:   Sodium 133 (*)    Chloride 94 (*)    Glucose, Bld 135 (*)    All other components within normal limits  GLUCOSE, CAPILLARY - Abnormal; Notable for the following components:   Glucose-Capillary 151 (*)    All other components within normal limits  HEMOGLOBIN A1C - Abnormal; Notable for the following components:   Hgb A1c MFr Bld 6.6 (*)    All other components within normal limits  GLUCOSE, CAPILLARY - Abnormal; Notable for the following components:   Glucose-Capillary 162 (*)    All other components within  normal limits  HEMOGLOBIN AND HEMATOCRIT, BLOOD - Abnormal; Notable for the following components:   Hemoglobin 8.2 (*)    HCT 28.7 (*)    All other components within normal limits  CULTURE, BLOOD (ROUTINE X 2)  CULTURE, BLOOD (ROUTINE X 2)  SARS CORONAVIRUS 2 (TAT 6-24 HRS)  LACTIC ACID, PLASMA  BRAIN NATRIURETIC PEPTIDE  PROTIME-INR  VITAMIN B12  FERRITIN  HIV ANTIBODY (ROUTINE TESTING W REFLEX)  GLUCOSE, CAPILLARY  POC OCCULT BLOOD, ED  TYPE AND SCREEN   PREPARE RBC (CROSSMATCH)  ABO/RH  PREPARE RBC (CROSSMATCH)  TROPONIN I (HIGH SENSITIVITY)  TROPONIN I (HIGH SENSITIVITY)    EKG EKG Interpretation  Date/Time:  Friday Jun 01 2020 12:47:05 EDT Ventricular Rate:  102 PR Interval:  196 QRS Duration: 110 QT Interval:  342 QTC Calculation: 445 R Axis:   8 Text Interpretation: Sinus tachycardia with Premature atrial complexes Incomplete right bundle branch block Cannot rule out Anterior infarct , age undetermined Abnormal ECG Confirmed by Thayer Jew [phone]) on 06/02/2020 4:32:35 PM   Radiology No results found.  Procedures .Critical Care Performed by: Corena Herter, PA-C Authorized by: Corena Herter, PA-C   Critical care provider statement:    Critical care time (minutes):  45   Critical care was necessary to treat or prevent imminent or life-threatening deterioration of the following conditions:  Cardiac failure and circulatory failure   Critical care was time spent personally by me on the following activities:  Discussions with consultants, evaluation of patient's response to treatment, examination of patient, ordering and performing treatments and interventions, ordering and review of laboratory studies, ordering and review of radiographic studies, pulse oximetry, re-evaluation of patient's condition, obtaining history from patient or surrogate and review of old charts Comments:     Symptomatic anemia with hemoglobin of 7.0, new DVT.     Medications Ordered in ED Medications  0.9 %  sodium chloride infusion (Manually program via Guardrails IV Fluids) (0 mLs Intravenous Stopped 06/01/20 1921)  furosemide (LASIX) injection 20 mg (20 mg Intravenous Given 06/01/20 2345)  iron sucrose (VENOFER) 300 mg in sodium chloride 0.9 % 250 mL IVPB (300 mg Intravenous New Bag/Given 06/02/20 1011)    ED Course  I have reviewed the triage vital signs and the nursing notes.  Pertinent labs & imaging results that were available  during my care of the patient were reviewed by me and considered in my medical decision making (see chart for details).    MDM Rules/Calculators/A&P                          Lam Hicken was evaluated in Emergency Department on 06/12/2020 for the symptoms described in the history of present illness. He was evaluated in the context of the global COVID-19 pandemic, which necessitated consideration that the patient might be at risk for infection with the SARS-CoV-2 virus that causes COVID-19. Institutional protocols and algorithms that pertain to the evaluation of patients at risk for COVID-19 are in a state of rapid change based on information released by regulatory bodies including the CDC and federal and state organizations. These policies and algorithms were followed during the patient's care in the ED.  I personally reviewed patient's medical chart and all notes from triage and staff during today's encounter. I have also ordered and reviewed all labs and imaging that I felt to be medically necessary in the evaluation of this patient's complaints and with consideration of their physical exam.  If needed, translation services were available and utilized.   Patient in the ED with a 3-day history of right lower extremity swelling and redness.  I noticed that patient was borderline febrile at 99.3 F and mildly tachycardic at 101 bpm while in triage.  His CBC at that time was also notable for mild leukocytosis to 13.0.  Given swelling and redness, I ordered lactic acid and blood cultures in the event that this was a soft tissue infection with SIRS criteria while DVT ultrasound was ordered and pending.  Patient patient metabolic panel was with mild hyperglycemia with likely near normal corrected sodium.  No renal impairment.  CBC with leukocytosis of 13.0, but actually improved when compared to priors.  His hemoglobin on the other hand is quite low at 7.0, consistently downtrending from 10.1 in labs obtained 1  year ago today.  It is a normocytic anemia.  He denies any obvious melena or recent bleeding.  DVT study demonstrates an acute right lower extremity DVT limited to the calf veins.  No particular extensive clot burden extending proximally.  Given lack of any chest pain, shortness of breath, or extensive clot burden, do not feel as though CTA imaging is warranted.  However, given that he is having downtrending hemoglobin to 7.0 despite telling me that he has been taking his outpatient iron supplements as directed, hesitant to initiate anticoagulation.  While he states that sometimes he feels mildly weak, he is otherwise asymptomatic and hemodynamically stable.  POC occult blood was collected, no gross blood on my exam.  Most recent colonoscopy was performed 09/16/2019.  Findings of internal hemorrhoids, diverticula, and multiple sessile polyps that were resected.  Fecal occult negative.  I discussed case with Dr. Reather Converse who agrees that patient should be admitted to the hospitalist for DVT in setting of down-trending hemoglobin to 7.0 despite outpatient iron replacement.  Dr. Waldron Labs will see and admit patient.  Final Clinical Impression(s) / ED Diagnoses Final diagnoses:  Acute deep vein thrombosis (DVT) of calf muscle vein of right lower extremity (HCC)  Symptomatic anemia    Rx / DC Orders ED Discharge Orders         Ordered    apixaban (ELIQUIS) 5 MG TABS tablet  2 times daily        06/02/20 1120    ferrous sulfate 325 (65 FE) MG tablet  2 times daily with meals        06/02/20 1120    apixaban (ELIQUIS) 5 MG TABS tablet  2 times daily        06/02/20 1120    Discharge instructions       Comments: Continue iron supplements, balanced diet... Started on Eliquis 10 mg p.o. twice daily x7 days then 5 mg twice daily for total of 3 months Follow-up with your PCP, High risk of bleeding with any kind of nicks or cuts due to blood thinners above... Avoid any cuts or bruises, falls    06/02/20 1120    Activity as tolerated - No restrictions        06/02/20 1120    Increase activity slowly        06/02/20 1120    Diet Carb Modified        06/02/20 1120    Call MD for:  redness, tenderness, or signs of infection (pain, swelling, redness, odor or Ayrton Mcvay/yellow discharge around incision site)        06/02/20 1120    Call MD for:  difficulty breathing,  headache or visual disturbances        06/02/20 1120    Call MD for:        06/02/20 1120           Reita Chard 06/01/20 1603    Elnora Morrison, MD 06/02/20 0519    Corena Herter, PA-C 06/12/20 1901    Elnora Morrison, MD 06/16/20 [phone]

## 2020-06-01 NOTE — ED Triage Notes (Signed)
Swelling right lower leg for 3 days

## 2020-06-01 NOTE — ED Provider Notes (Signed)
Emergency Medicine Provider Triage Evaluation Note  George Anthony , a 70 y.o. male  was evaluated in triage.  Pt complains of right leg swelling. States he noticed the swelling two days ago. States it isn't tender to touch but hurts when he walks on it. No known history of blood clots. He isn't sure if the swelling is worsening. He denies chest pain or SOB. No recent travel or surgeries.   Review of Systems  Positive: Right leg swelling Negative: Chest pain, SOB, fevers, chills  Physical Exam  BP (!) 153/85 (BP Location: Right Arm)   Pulse (!) 101   Temp 99.3 F (37.4 C) (Oral)   Resp 18   Ht 5\' 7"  (1.702 m)   Wt 84.9 kg   SpO2 96%   BMI 29.33 kg/m  Gen:   Awake, no distress   Resp:  Normal effort  MSK:   Moves extremities without difficulty  Other:  2+ Pitting edema bilaterally. Right leg is swollen, mild erythema and warmth compared to the left.   Medical Decision Making  Medically screening exam initiated at 1:04 PM.  Appropriate orders placed.  George Anthony was informed that the remainder of the evaluation will be completed by another provider, this initial triage assessment does not replace that evaluation, and the importance of remaining in the ED until their evaluation is complete.     George Raring, PA-C 06/01/20 1308    George Morrison, MD 06/02/20 [phone]

## 2020-06-01 NOTE — Progress Notes (Signed)
ANTICOAGULATION CONSULT NOTE - Follow Up Consult  Pharmacy Consult for lovenox Indication: DVT  No Known Allergies  Patient Measurements: Height: 5\' 7"  (170.2 cm) Weight: 84.9 kg (187 lb 4 oz) IBW/kg (Calculated) : 66.1  Vital Signs: Temp: 98.5 F (36.9 C) (05/13 1424) Temp Source: Oral (05/13 1424) BP: 152/73 (05/13 1600) Pulse Rate: 84 (05/13 1600)  Labs: Recent Labs    06/01/20 1258 06/01/20 1354  HGB 7.0*  --   HCT 25.3*  --   PLT 438*  --   APTT 49*  --   LABPROT 14.7  --   INR 1.2  --   CREATININE 0.71  --   TROPONINIHS 6 6    Estimated Creatinine Clearance: 90.7 mL/min (by C-G formula based on SCr of 0.71 mg/dL).   Medications:  (Not in a hospital admission)  Scheduled:  . enoxaparin (LOVENOX) injection  80 mg Subcutaneous Q12H   Infusions:   PRN:  Anti-infectives (From admission, onward)   None      Assessment: 89 YOM, Dr Waldron Labs would like to proceed with lovenox despite baseline Hgb 7.0 for treatment of DVT.   Goal of Therapy:  Anti-Xa level 0.6-1 units/ml 4hrs after LMWH dose given Monitor platelets by anticoagulation protocol: Yes   Plan:  Lovenox 80mg  subq q12h  CBC daily  Monitor for signs and symptoms of bleeding   Donna Christen Lounell Schumacher 06/01/2020,4:46 PM

## 2020-06-02 DIAGNOSIS — D509 Iron deficiency anemia, unspecified: Secondary | ICD-10-CM | POA: Diagnosis not present

## 2020-06-02 DIAGNOSIS — E785 Hyperlipidemia, unspecified: Secondary | ICD-10-CM | POA: Diagnosis not present

## 2020-06-02 DIAGNOSIS — E1169 Type 2 diabetes mellitus with other specified complication: Secondary | ICD-10-CM | POA: Diagnosis not present

## 2020-06-02 DIAGNOSIS — I82491 Acute embolism and thrombosis of other specified deep vein of right lower extremity: Secondary | ICD-10-CM | POA: Diagnosis not present

## 2020-06-02 LAB — BASIC METABOLIC PANEL
Anion gap: 10 (ref 5–15)
BUN: 10 mg/dL (ref 8–23)
CO2: 29 mmol/L (ref 22–32)
Calcium: 9.2 mg/dL (ref 8.9–10.3)
Chloride: 94 mmol/L — ABNORMAL LOW (ref 98–111)
Creatinine, Ser: 0.61 mg/dL (ref 0.61–1.24)
GFR, Estimated: >60 mL/min (ref >60)
Glucose, Bld: 135 mg/dL — ABNORMAL HIGH (ref 70–99)
Potassium: 4.1 mmol/L (ref 3.5–5.1)
Sodium: 133 mmol/L — ABNORMAL LOW (ref 135–145)

## 2020-06-02 LAB — SARS CORONAVIRUS 2 (TAT 6-24 HRS): SARS Coronavirus 2: NEGATIVE

## 2020-06-02 LAB — HEMOGLOBIN A1C
Hgb A1c MFr Bld: 6.6 % — ABNORMAL HIGH (ref 4.8–5.6)
Mean Plasma Glucose: 142.72 mg/dL

## 2020-06-02 LAB — CBC
HCT: 27.2 % — ABNORMAL LOW (ref 39.0–52.0)
Hemoglobin: 7.6 g/dL — ABNORMAL LOW (ref 13.0–17.0)
MCH: 22.5 pg — ABNORMAL LOW (ref 26.0–34.0)
MCHC: 27.9 g/dL — ABNORMAL LOW (ref 30.0–36.0)
MCV: 80.5 fL (ref 80.0–100.0)
Platelets: 452 10*3/uL — ABNORMAL HIGH (ref 150–400)
RBC: 3.38 MIL/uL — ABNORMAL LOW (ref 4.22–5.81)
RDW: 17.2 % — ABNORMAL HIGH (ref 11.5–15.5)
WBC: 12.4 10*3/uL — ABNORMAL HIGH (ref 4.0–10.5)
nRBC: 0 % (ref 0.0–0.2)

## 2020-06-02 LAB — PREPARE RBC (CROSSMATCH)

## 2020-06-02 LAB — HIV ANTIBODY (ROUTINE TESTING W REFLEX): HIV Screen 4th Generation wRfx: NONREACTIVE

## 2020-06-02 LAB — GLUCOSE, CAPILLARY
Glucose-Capillary: 151 mg/dL — ABNORMAL HIGH (ref 70–99)
Glucose-Capillary: 162 mg/dL — ABNORMAL HIGH (ref 70–99)
Glucose-Capillary: 87 mg/dL (ref 70–99)

## 2020-06-02 LAB — HEMOGLOBIN AND HEMATOCRIT, BLOOD
HCT: 28.7 % — ABNORMAL LOW (ref 39.0–52.0)
Hemoglobin: 8.2 g/dL — ABNORMAL LOW (ref 13.0–17.0)

## 2020-06-02 MED ORDER — SODIUM CHLORIDE 0.9 % IV SOLN
300.0000 mg | Freq: Once | INTRAVENOUS | Status: AC
  Administered 2020-06-02: 300 mg via INTRAVENOUS
  Filled 2020-06-02: qty 15

## 2020-06-02 MED ORDER — APIXABAN 5 MG PO TABS: 5.0000 mg | ORAL_TABLET | Freq: Two times a day (BID) | ORAL | 0 refills | Status: DC

## 2020-06-02 MED ORDER — APIXABAN 5 MG PO TABS: 5.0000 mg | ORAL_TABLET | Freq: Two times a day (BID) | ORAL | Status: DC

## 2020-06-02 MED ORDER — FERROUS SULFATE 325 (65 FE) MG PO TABS: 325.0000 mg | ORAL_TABLET | Freq: Two times a day (BID) | ORAL | Status: DC

## 2020-06-02 MED ORDER — INSULIN ASPART 100 UNIT/ML IJ SOLN
0.0000 [IU] | Freq: Three times a day (TID) | INTRAMUSCULAR | Status: DC
  Administered 2020-06-02: 1 [IU] via SUBCUTANEOUS

## 2020-06-02 MED ORDER — APIXABAN 5 MG PO TABS
10.0000 mg | ORAL_TABLET | Freq: Two times a day (BID) | ORAL | Status: DC
  Administered 2020-06-02: 10 mg via ORAL
  Filled 2020-06-02: qty 2

## 2020-06-02 MED ORDER — SODIUM CHLORIDE 0.9% IV SOLUTION: Freq: Once | INTRAVENOUS | Status: DC

## 2020-06-02 MED ORDER — APIXABAN 5 MG PO TABS: 10.0000 mg | ORAL_TABLET | Freq: Two times a day (BID) | ORAL | 0 refills | Status: DC

## 2020-06-02 MED ORDER — FERROUS SULFATE 325 (65 FE) MG PO TABS: 325.0000 mg | ORAL_TABLET | Freq: Two times a day (BID) | ORAL | 3 refills | Status: DC

## 2020-06-02 NOTE — TOC Transition Note (Signed)
Transition of Care [facility]) - CM/SW Discharge Note   Patient Details  Name: George Anthony MRN: [id number] Date of Birth: [birth year]  [facility] CM/SW Contact:  Natasha Bence, LCSW Phone Number: 06/02/2020, 11:58 AM   Clinical Narrative:    CSW notified of patient's readiness for discharge. CSW performed ins coverage check with [place] with patient's provided insurance information. CSW verified patient's insurance provides Eliquis coverage. TOC signing off.         Patient Goals and CMS Choice        Discharge Placement                       Discharge Plan and Services                                     Social Determinants of Health (SDOH) Interventions     Readmission Risk Interventions No flowsheet data found.

## 2020-06-02 NOTE — Discharge Summary (Signed)
Physician Discharge Summary [facility]    Patient: George Anthony                   Admit date: 06/01/2020   DOB: [birth year]             Discharge date:06/02/2020/11:30 AM GHW:[id number]                          PCP: George Brooklyn, FNP  Disposition: HOME  Recommendations for Outpatient Follow-up:   . Follow up: in 1 week w PCP . F/up with GI (will call for Appointment) needed anemia workup and EGD/colonoscopy in 3 months  Discharge Condition: Stable   Code Status:   Code Status: Full Code  Diet recommendation: Diabetic diet   Discharge Diagnoses:    Active Problems:   Alcohol abuse   Fatty liver   Cigarette nicotine dependence without complication   DM type 2 with diabetic dyslipidemia ([place])   Hypertension associated with type 2 diabetes mellitus ([place])   Anemia   Acute DVT (deep venous thrombosis) ([place])   History of Present Illness/ Hospital Course George Anthony Summary:     George Anthony  is a 70 y.o. male, with past medical history of hypertension, hyperlipidemia, history of alcohol use disorder, iron deficiency anemia, on iron supplements, type 2 diabetes mellitus, patient presents to ED secondary to complaints of right lower extremity pain, patient denies any leg trauma, recent leg travel, any history of DVT/PE, denies any chest pain, denies any hemoptysis or shortness of breath, he denies any melena, bright red blood per rectum, any coffee-ground emesis, patient with known history of chronic anemia, on iron supplements. - in ED his venous Doppler significant for right lower extremity DVT, hemoglobin was 7, he was Hemoccult negative, [facility] consulted to admit.    Acute DVT -Appears to be unprovoked, Initially treated on therapeutic Lovenox.  Transition to p.o. Eliquis, 10 mg p.o. twice daily x7 days then 5 mg p.o. twice daily for minimum of 3 months. Patient is to follow with a PCP for close monitoring.  Pros and cons of anticoagulation was discussed in  detail with patient he expresses understanding.  Normocytic anemia -Acute on chronic, baseline around 8, it is 7 today, will transfuse 2 unit PRBC especially he will be started on full anticoagulation. -Anemia work-up field severe iron deficiency, 1 dose IV iron was given, patient was restarted on iron supplements  Hemoccult negative, -Patient instructed to follow-up in 3 months with gastroenterologist for further anemia work-up, likely will need upper and lower endoscopy  Diabetes mellitus, type II, insulin-dependent -Resume Lantus at a lower dose 15>10 units, will add insulin sliding scale,  -Resume home medication including metformin and glipizide  -Strict blood sugar checks, strict diabetic diet recommended   Hyperlipidemia -Continue with atorvastatin  Hypertension -Continue home medications  Disposition : Patient stable to be discharged home, resuming home medication, prescription for supplement iron and Eliquis given.       Discharge Instructions:   Discharge Instructions    Activity as tolerated - No restrictions   Complete by: As directed    Call MD for:   Complete by: As directed    Call MD for:  difficulty breathing, headache or visual disturbances   Complete by: As directed    Call MD for:  redness, tenderness, or signs of infection (pain, swelling, redness, odor or green/yellow discharge around incision site)   Complete by: As directed  Diet Carb Modified   Complete by: As directed    Discharge instructions   Complete by: As directed    Continue iron supplements, balanced diet... Started on Eliquis 10 mg p.o. twice daily x7 days then 5 mg twice daily for total of 3 months Follow-up with your PCP, High risk of bleeding with any kind of nicks or cuts due to blood thinners above... Avoid any cuts or bruises, falls   Increase activity slowly   Complete by: As directed        Medication List    TAKE these medications   acetaminophen 500 MG  tablet Commonly known as: TYLENOL Take 1,000 mg by mouth every 6 (six) hours as needed for moderate pain.   apixaban 5 MG Tabs tablet Commonly known as: ELIQUIS Take 2 tablets (10 mg total) by mouth 2 (two) times daily for 7 days.   apixaban 5 MG Tabs tablet Commonly known as: ELIQUIS Take 1 tablet (5 mg total) by mouth 2 (two) times daily. Start taking on: Jun 09, 2020   atorvastatin 40 MG tablet Commonly known as: LIPITOR Take 1 tablet (40 mg total) by mouth at bedtime.   B-D UF III MINI PEN NEEDLES 31G X 5 MM Misc Generic drug: Insulin Pen Needle Use with insulin daily Dx E11.69   ferrous sulfate 325 (65 FE) MG tablet Take 1 tablet (325 mg total) by mouth 2 (two) times daily with a meal. Start taking on: Jun 03, 2020 What changed:   medication strength  when to take this   glipiZIDE 5 MG tablet Commonly known as: GLUCOTROL Take 1 tablet (5 mg total) by mouth 2 (two) times daily before a meal.   glucose blood test strip Check TID and PRN dx E11.9   Insulin Glargine-yfgn 100 UNIT/ML Soln Commonly known as: Semglee (yfgn) Inject 15 Units into the skin daily.   losartan 25 MG tablet Commonly known as: COZAAR Take 1 tablet (25 mg total) by mouth daily.   metFORMIN 1000 MG tablet Commonly known as: GLUCOPHAGE Take 1 tablet (1,000 mg total) by mouth 2 (two) times daily with a meal.       No Known Allergies   Procedures /Studies:   DG Chest 2 View  Result Date: 06/01/2020 CLINICAL DATA:  Lower extremity edema EXAM: CHEST - 2 VIEW COMPARISON:  November 18, 2011 FINDINGS: There is generalized interstitial thickening. No edema or airspace opacity. Heart is upper normal in size with mild pulmonary venous hypertension. No adenopathy. No bone lesions. IMPRESSION: Heart upper normal in size with a degree of pulmonary venous hypertension. Interstitial thickening likely reflects underlying chronic bronchitis. A slight degree of interstitial edema is difficult to exclude.  No airspace consolidation. No adenopathy. Electronically Signed   By: Lowella Grip III M.D.   On: 06/01/2020 13:38   US Venous Img Lower Unilateral Right  Result Date: 06/01/2020 CLINICAL DATA:  70 year old male with right lower extremity swelling for 2 days. EXAM: RIGHT LOWER EXTREMITY VENOUS DOPPLER ULTRASOUND TECHNIQUE: Gray-scale sonography with graded compression, as well as color Doppler and duplex ultrasound were performed to evaluate the right lower extremity deep venous systems from the level of the common femoral vein and including the common femoral, femoral, profunda femoral, popliteal and calf veins including the posterior tibial, peroneal and gastrocnemius veins when visible. Spectral Doppler was utilized to evaluate flow at rest and with distal augmentation maneuvers in the common femoral, femoral and popliteal veins. The contralateral common femoral vein was also evaluated for  comparison. COMPARISON:  None. FINDINGS: RIGHT LOWER EXTREMITY Common Femoral Vein: No evidence of thrombus. Normal compressibility, respiratory phasicity and response to augmentation. Central Greater Saphenous Vein: No evidence of thrombus. Normal compressibility and flow on color Doppler imaging. Central Profunda Femoral Vein: No evidence of thrombus. Normal compressibility and flow on color Doppler imaging. Femoral Vein: No evidence of thrombus. Normal compressibility, respiratory phasicity and response to augmentation. Popliteal Vein: No evidence of thrombus. Normal compressibility, respiratory phasicity and response to augmentation. Calf Veins: Acute occlusive thrombus in the posterior tibial, anterior tibial, and peroneal veins. Venous Reflux:  None. Other Findings:  None. LEFT LOWER EXTREMITY Common Femoral Vein: No evidence of thrombus. Normal compressibility, respiratory phasicity and response to augmentation. IMPRESSION: Acute right lower extremity deep vein thrombosis limited to the calf veins. Ruthann Cancer,  MD [facility] Electronically Signed   By: Ruthann Cancer MD   On: 06/01/2020 13:44     Subjective:   Patient was seen and examined 06/02/2020, 11:30 AM Patient stable today. No acute distress.  No issues overnight Stable for discharge.  Discharge Exam:    Vitals:   06/01/20 2005 06/01/20 2130 06/01/20 2223 06/02/20 0515  BP: 129/74 128/73 134/81 133/67  Pulse: 84 80 78 81  Resp: (!) 23 (!) 21 17 20   Temp: 98.3 F (36.8 C) 98.3 F (36.8 C) 97.8 F (36.6 C) 98.5 F (36.9 C)  TempSrc: Oral Oral Oral Oral  SpO2: 96% 97% 97% 97%  Weight:      Height:        General: Pt lying comfortably in bed & appears in no obvious distress. Cardiovascular: S1 & S2 heard, RRR, S1/S2 +. No murmurs, rubs, gallops or clicks. No JVD or pedal edema. Respiratory: Clear to auscultation without wheezing, rhonchi or crackles. No increased work of breathing. Abdominal:  Non-distended, non-tender & soft. No organomegaly or masses appreciated. Normal bowel sounds heard. CNS: Alert and oriented. No focal deficits. Extremities: no edema, no cyanosis      The results of significant diagnostics from this hospitalization (including imaging, microbiology, ancillary and laboratory) are listed below for reference.      Microbiology:   Recent Results (from the past 240 hour(s))  Blood culture (routine x 2)     Status: None (Preliminary result)   Collection Time: 06/01/20  1:47 PM   Specimen: BLOOD  Result Value Ref Range Status   Specimen Description BLOOD LEFT ANTECUBITAL  Final   Special Requests   Final    BOTTLES DRAWN AEROBIC AND ANAEROBIC Blood Culture adequate volume   Culture   Final    NO GROWTH < 24 HOURS Performed at [facility], [address] [place], [place] [address]    Report Status PENDING  Incomplete  Blood culture (routine x 2)     Status: None (Preliminary result)   Collection Time: 06/01/20  1:51 PM   Specimen: BLOOD   Result Value Ref Range Status   Specimen Description BLOOD RIGHT ANTECUBITAL  Final   Special Requests   Final    BOTTLES DRAWN AEROBIC AND ANAEROBIC Blood Culture adequate volume   Culture   Final    NO GROWTH < 24 HOURS Performed at [facility], [address] [place], [place] [address]    Report Status PENDING  Incomplete  SARS CORONAVIRUS 2 (TAT 6-24 HRS) Nasopharyngeal Nasopharyngeal Swab     Status: None   Collection Time: 06/01/20  3:09 PM   Specimen: Nasopharyngeal Swab  Result  Value Ref Range Status   SARS Coronavirus 2 NEGATIVE NEGATIVE Final    Comment: (NOTE) SARS-CoV-2 target nucleic acids are NOT DETECTED.  The SARS-CoV-2 RNA is generally detectable in upper and lower respiratory specimens during the acute phase of infection. Negative results do not preclude SARS-CoV-2 infection, do not rule out co-infections with other pathogens, and should not be used as the sole basis for treatment or other patient management decisions. Negative results must be combined with clinical observations, patient history, and epidemiological information. The expected result is Negative.  Fact Sheet for Patients: SugarRoll.be  Fact Sheet for Healthcare Providers: https://www.woods-mathews.com/  This test is not yet approved or cleared by the [place] FDA and  has been authorized for detection and/or diagnosis of SARS-CoV-2 by FDA under an Emergency Use Authorization (EUA). This EUA will remain  in effect (meaning this test can be used) for the duration of the COVID-19 declaration under Se ction 564(b)(1) of the Act, 21 U.S.C. section 360bbb-3(b)(1), unless the authorization is terminated or revoked sooner.  Performed at [facility], [place] [address], [place], [place] [address]      Labs:   CBC: Recent Labs  Lab 06/01/20 1258 06/02/20 0555  WBC 13.0* 12.4*  HGB 7.0* 7.6*  HCT 25.3* 27.2*  MCV 80.3 80.5  PLT 438* 452*    Basic Metabolic Panel: Recent Labs  Lab 06/01/20 1258 06/02/20 0555  NA 132* 133*  K 3.7 4.1  CL 96* 94*  CO2 26 29  GLUCOSE 177* 135*  BUN 11 10  CREATININE 0.71 0.61  CALCIUM 8.8* 9.2   Liver Function Tests: No results for input(s): AST, ALT, ALKPHOS, BILITOT, PROT, ALBUMIN in the last 168 hours. BNP (last 3 results) Recent Labs    06/01/20 1258  BNP 82.0   Cardiac Enzymes: No results for input(s): CKTOTAL, CKMB, CKMBINDEX, TROPONINI in the last 168 hours. CBG: Recent Labs  Lab 06/02/20 0743 06/02/20 1104  GLUCAP 151* 162*   Hgb A1c No results for input(s): HGBA1C in the last 72 hours. Lipid Profile No results for input(s): CHOL, HDL, LDLCALC, TRIG, CHOLHDL, LDLDIRECT in the last 72 hours. Thyroid function studies No results for input(s): TSH, T4TOTAL, T3FREE, THYROIDAB in the last 72 hours.  Invalid input(s): FREET3 Anemia work up Recent Labs    06/01/20 1348 06/01/20 1627  VITAMINB12  --  601  FOLATE  --  3.9*  FERRITIN  --  103  TIBC 280 274  IRON 21* 14*  RETICCTPCT  --  1.8   Urinalysis    Component Value Date/Time   COLORURINE YELLOW 11/19/2011 0158   APPEARANCEUR CLEAR 11/19/2011 0158   LABSPEC >1.030 (H) 11/19/2011 0158   PHURINE 6.0 11/19/2011 0158   GLUCOSEU NEGATIVE 11/19/2011 0158   HGBUR MODERATE (A) 11/19/2011 0158   BILIRUBINUR MODERATE (A) 11/19/2011 0158   KETONESUR 40 (A) 11/19/2011 0158   PROTEINUR 30 (A) 11/19/2011 0158   UROBILINOGEN 2.0 (H) 11/19/2011 0158   NITRITE NEGATIVE 11/19/2011 0158   LEUKOCYTESUR NEGATIVE 11/19/2011 0158         Time coordinating discharge: Over 53 minutes  SIGNED: Deatra James, MD, FACP, FHM. [facility],  Please use [email or web address] to Page If 7PM-7AM, please contact night-coverage [email or web address], Password [facility] 06/02/2020, 11:30 AM

## 2020-06-02 NOTE — Progress Notes (Signed)
ANTICOAGULATION CONSULT NOTE - Follow Up Consult  Pharmacy Consult for apixaban Indication: DVT  No Known Allergies  Patient Measurements: Height: 5\' 7"  (170.2 cm) Weight: 84.9 kg (187 lb 4 oz) IBW/kg (Calculated) : 66.1  Vital Signs: Temp: 98.5 F (36.9 C) (05/14 0515) Temp Source: Oral (05/14 0515) BP: 133/67 (05/14 0515) Pulse Rate: 81 (05/14 0515)  Labs: Recent Labs    06/01/20 1258 06/01/20 1354 06/02/20 0555  HGB 7.0*  --  7.6*  HCT 25.3*  --  27.2*  PLT 438*  --  452*  APTT 49*  --   --   LABPROT 14.7  --   --   INR 1.2  --   --   CREATININE 0.71  --  0.61  TROPONINIHS 6 6  --     Estimated Creatinine Clearance: 90.7 mL/min (by C-G formula based on SCr of 0.61 mg/dL).   Medications:  Medications Prior to Admission  Medication Sig Dispense Refill Last Dose  . acetaminophen (TYLENOL) 500 MG tablet Take 1,000 mg by mouth every 6 (six) hours as needed for moderate pain.   05/31/2020 at Unknown time  . atorvastatin (LIPITOR) 40 MG tablet Take 1 tablet (40 mg total) by mouth at bedtime. 90 tablet 1 05/31/2020 at Unknown time  . FEROSUL 325 (65 Fe) MG tablet Take 1 tablet (325 mg total) by mouth daily. 90 tablet 1 05/31/2020 at Unknown time  . glipiZIDE (GLUCOTROL) 5 MG tablet Take 1 tablet (5 mg total) by mouth 2 (two) times daily before a meal. 60 tablet 2 05/31/2020 at Unknown time  . Insulin Glargine-yfgn (SEMGLEE, YFGN,) 100 UNIT/ML SOLN Inject 15 Units into the skin daily. 10 mL 1 05/31/2020 at Unknown time  . losartan (COZAAR) 25 MG tablet Take 1 tablet (25 mg total) by mouth daily. 90 tablet 1 05/31/2020 at Unknown time  . metFORMIN (GLUCOPHAGE) 1000 MG tablet Take 1 tablet (1,000 mg total) by mouth 2 (two) times daily with a meal. 180 tablet 1 05/31/2020 at Unknown time  . B-D UF III MINI PEN NEEDLES 31G X 5 MM MISC Use with insulin daily Dx E11.69 100 each 3   . glucose blood test strip Check TID and PRN dx E11.9 100 each 11    Scheduled:  . sodium chloride    Intravenous Once  . apixaban  10 mg Oral BID   Followed by  . [START ON 06/09/2020] apixaban  5 mg Oral BID  . atorvastatin  40 mg Oral QHS  . [START ON 06/03/2020] ferrous sulfate  325 mg Oral BID WC  . glipiZIDE  5 mg Oral BID AC  . insulin aspart  0-6 Units Subcutaneous TID WC  . insulin glargine  10 Units Subcutaneous Daily  . losartan  25 mg Oral Daily   Infusions:  . iron sucrose     PRN:  Anti-infectives (From admission, onward)   None      Assessment: 30 YOM requiring apixaban for DVT   Goal of Therapy:  apixaban anticoagulation Monitor platelets by anticoagulation protocol: Yes   Plan:  apixaban 10mg  po bid x 7 days, followed by 5mg  po bid thereafter    George Anthony George Anthony 06/02/2020,8:19 AM

## 2020-06-03 LAB — TYPE AND SCREEN
ABO/RH(D): O POS
Antibody Screen: NEGATIVE
Unit division: 0
Unit division: 0

## 2020-06-03 LAB — BPAM RBC
Blood Product Expiration Date: 202206112359
Blood Product Expiration Date: 202206132359
ISSUE DATE / TIME: 202205131811
ISSUE DATE / TIME: 202205141229
Unit Type and Rh: 5100
Unit Type and Rh: 5100

## 2020-06-04 ENCOUNTER — Telehealth: Payer: Self-pay

## 2020-06-04 NOTE — Telephone Encounter (Signed)
Transition Care Management Unsuccessful Follow-up Telephone Call  Date of discharge and from where:  [facility] 06/02/20  Attempts:  1st Attempt  Reason for unsuccessful TCM follow-up call:  Unable to leave message   Transition Care Management Unsuccessful Follow-up Telephone Call  Date of discharge and from where:  [facility] 06/02/20  Attempts:  2nd Attempt  Reason for unsuccessful TCM follow-up call:  Left voice message   Transition Care Management Unsuccessful Follow-up Telephone Call  Date of discharge and from where:  [facility] 06/02/20  Attempts:  3rd Attempt  Reason for unsuccessful TCM follow-up call:  Left voice message

## 2020-06-06 LAB — CULTURE, BLOOD (ROUTINE X 2)
Culture: NO GROWTH
Culture: NO GROWTH
Special Requests: ADEQUATE
Special Requests: ADEQUATE

## 2020-07-13 ENCOUNTER — Telehealth: Payer: Self-pay | Admitting: Family Medicine

## 2020-07-13 DIAGNOSIS — E785 Hyperlipidemia, unspecified: Secondary | ICD-10-CM

## 2020-07-13 DIAGNOSIS — E1169 Type 2 diabetes mellitus with other specified complication: Secondary | ICD-10-CM

## 2020-07-13 MED ORDER — GLIPIZIDE 5 MG PO TABS: 5.0000 mg | ORAL_TABLET | Freq: Two times a day (BID) | ORAL | 0 refills | Status: DC

## 2020-07-13 NOTE — Telephone Encounter (Signed)
  Prescription Request  07/13/2020  What is the name of the medication or equipment? Glipizide  Have you contacted your pharmacy to request a refill? (if applicable) yes  Which pharmacy would you like this sent to? [facility], [place]  Pt is completely out of his Glipizide Rx and needs refill called in to last him until his appt with Brit[facility] on 08/09/20.

## 2020-07-13 NOTE — Telephone Encounter (Signed)
NA/NVM refills sent to pharmacy

## 2020-08-09 ENCOUNTER — Other Ambulatory Visit: Payer: Self-pay

## 2020-08-09 ENCOUNTER — Observation Stay (HOSPITAL_COMMUNITY): Payer: BC Managed Care – PPO

## 2020-08-09 ENCOUNTER — Emergency Department (HOSPITAL_COMMUNITY): Payer: BC Managed Care – PPO

## 2020-08-09 ENCOUNTER — Ambulatory Visit: Payer: BC Managed Care – PPO | Admitting: Family Medicine

## 2020-08-09 ENCOUNTER — Encounter: Payer: Self-pay | Admitting: Family Medicine

## 2020-08-09 ENCOUNTER — Encounter (HOSPITAL_COMMUNITY): Payer: Self-pay | Admitting: *Deleted

## 2020-08-09 ENCOUNTER — Observation Stay (HOSPITAL_COMMUNITY)
Admission: EM | Admit: 2020-08-09 | Discharge: 2020-08-11 | Disposition: A | Discharge status: Home / Self Care | Payer: BC Managed Care – PPO | Attending: Family Medicine | Admitting: Family Medicine

## 2020-08-09 VITALS — BP 131/73 | HR 93 | Temp 97.8°F | Ht 67.0 in | Wt 174.4 lb

## 2020-08-09 DIAGNOSIS — E785 Hyperlipidemia, unspecified: Secondary | ICD-10-CM

## 2020-08-09 DIAGNOSIS — K8020 Calculus of gallbladder without cholecystitis without obstruction: Secondary | ICD-10-CM | POA: Diagnosis not present

## 2020-08-09 DIAGNOSIS — Z7984 Long term (current) use of oral hypoglycemic drugs: Secondary | ICD-10-CM

## 2020-08-09 DIAGNOSIS — E1169 Type 2 diabetes mellitus with other specified complication: Secondary | ICD-10-CM | POA: Diagnosis not present

## 2020-08-09 DIAGNOSIS — I82409 Acute embolism and thrombosis of unspecified deep veins of unspecified lower extremity: Secondary | ICD-10-CM | POA: Diagnosis present

## 2020-08-09 DIAGNOSIS — S0990XA Unspecified injury of head, initial encounter: Secondary | ICD-10-CM | POA: Diagnosis not present

## 2020-08-09 DIAGNOSIS — R634 Abnormal weight loss: Secondary | ICD-10-CM

## 2020-08-09 DIAGNOSIS — S0003XA Contusion of scalp, initial encounter: Secondary | ICD-10-CM

## 2020-08-09 DIAGNOSIS — Z20822 Contact with and (suspected) exposure to covid-19: Secondary | ICD-10-CM | POA: Diagnosis not present

## 2020-08-09 DIAGNOSIS — C641 Malignant neoplasm of right kidney, except renal pelvis: Secondary | ICD-10-CM | POA: Diagnosis not present

## 2020-08-09 DIAGNOSIS — W19XXXA Unspecified fall, initial encounter: Secondary | ICD-10-CM | POA: Diagnosis present

## 2020-08-09 DIAGNOSIS — N2889 Other specified disorders of kidney and ureter: Secondary | ICD-10-CM

## 2020-08-09 DIAGNOSIS — K5790 Diverticulosis of intestine, part unspecified, without perforation or abscess without bleeding: Secondary | ICD-10-CM | POA: Diagnosis present

## 2020-08-09 DIAGNOSIS — I152 Hypertension secondary to endocrine disorders: Secondary | ICD-10-CM

## 2020-08-09 DIAGNOSIS — D649 Anemia, unspecified: Principal | ICD-10-CM | POA: Diagnosis present

## 2020-08-09 DIAGNOSIS — Z86718 Personal history of other venous thrombosis and embolism: Secondary | ICD-10-CM

## 2020-08-09 DIAGNOSIS — Z79899 Other long term (current) drug therapy: Secondary | ICD-10-CM | POA: Insufficient documentation

## 2020-08-09 DIAGNOSIS — I2510 Atherosclerotic heart disease of native coronary artery without angina pectoris: Secondary | ICD-10-CM | POA: Diagnosis not present

## 2020-08-09 DIAGNOSIS — E11649 Type 2 diabetes mellitus with hypoglycemia without coma: Secondary | ICD-10-CM | POA: Diagnosis present

## 2020-08-09 DIAGNOSIS — I313 Pericardial effusion (noninflammatory): Secondary | ICD-10-CM | POA: Diagnosis not present

## 2020-08-09 DIAGNOSIS — E538 Deficiency of other specified B group vitamins: Secondary | ICD-10-CM | POA: Diagnosis present

## 2020-08-09 DIAGNOSIS — F1010 Alcohol abuse, uncomplicated: Secondary | ICD-10-CM | POA: Diagnosis not present

## 2020-08-09 DIAGNOSIS — D509 Iron deficiency anemia, unspecified: Secondary | ICD-10-CM | POA: Diagnosis not present

## 2020-08-09 DIAGNOSIS — F17210 Nicotine dependence, cigarettes, uncomplicated: Secondary | ICD-10-CM | POA: Diagnosis not present

## 2020-08-09 DIAGNOSIS — R296 Repeated falls: Secondary | ICD-10-CM

## 2020-08-09 DIAGNOSIS — E1159 Type 2 diabetes mellitus with other circulatory complications: Secondary | ICD-10-CM

## 2020-08-09 DIAGNOSIS — I10 Essential (primary) hypertension: Secondary | ICD-10-CM | POA: Insufficient documentation

## 2020-08-09 DIAGNOSIS — I82220 Acute embolism and thrombosis of inferior vena cava: Secondary | ICD-10-CM | POA: Diagnosis present

## 2020-08-09 DIAGNOSIS — E871 Hypo-osmolality and hyponatremia: Secondary | ICD-10-CM | POA: Diagnosis not present

## 2020-08-09 DIAGNOSIS — E119 Type 2 diabetes mellitus without complications: Secondary | ICD-10-CM | POA: Diagnosis present

## 2020-08-09 DIAGNOSIS — Z794 Long term (current) use of insulin: Secondary | ICD-10-CM | POA: Diagnosis present

## 2020-08-09 DIAGNOSIS — Z825 Family history of asthma and other chronic lower respiratory diseases: Secondary | ICD-10-CM

## 2020-08-09 DIAGNOSIS — Z813 Family history of other psychoactive substance abuse and dependence: Secondary | ICD-10-CM

## 2020-08-09 DIAGNOSIS — Z7901 Long term (current) use of anticoagulants: Secondary | ICD-10-CM

## 2020-08-09 DIAGNOSIS — R42 Dizziness and giddiness: Secondary | ICD-10-CM | POA: Diagnosis present

## 2020-08-09 DIAGNOSIS — I82491 Acute embolism and thrombosis of other specified deep vein of right lower extremity: Secondary | ICD-10-CM | POA: Diagnosis not present

## 2020-08-09 DIAGNOSIS — M542 Cervicalgia: Secondary | ICD-10-CM | POA: Diagnosis not present

## 2020-08-09 LAB — RESP PANEL BY RT-PCR (FLU A&B, COVID) ARPGX2
Influenza A by PCR: NEGATIVE
Influenza B by PCR: NEGATIVE
SARS Coronavirus 2 by RT PCR: NEGATIVE

## 2020-08-09 LAB — CBG MONITORING, ED: Glucose-Capillary: 87 mg/dL (ref 70–99)

## 2020-08-09 LAB — CBC WITH DIFFERENTIAL/PLATELET
Abs Immature Granulocytes: 0.08 10*3/uL — ABNORMAL HIGH (ref 0.00–0.07)
Basophils Absolute: 0 10*3/uL (ref 0.0–0.1)
Basophils Relative: 0 %
Eosinophils Absolute: 0.1 10*3/uL (ref 0.0–0.5)
Eosinophils Relative: 0 %
HCT: 23.8 % — ABNORMAL LOW (ref 39.0–52.0)
Hemoglobin: 6.5 g/dL — CL (ref 13.0–17.0)
Immature Granulocytes: 1 %
Lymphocytes Relative: 34 %
Lymphs Abs: 4.2 10*3/uL — ABNORMAL HIGH (ref 0.7–4.0)
MCH: 22.6 pg — ABNORMAL LOW (ref 26.0–34.0)
MCHC: 27.3 g/dL — ABNORMAL LOW (ref 30.0–36.0)
MCV: 82.6 fL (ref 80.0–100.0)
Monocytes Absolute: 0.7 10*3/uL (ref 0.1–1.0)
Monocytes Relative: 6 %
Neutro Abs: 7.3 10*3/uL (ref 1.7–7.7)
Neutrophils Relative %: 59 %
Platelets: 546 10*3/uL — ABNORMAL HIGH (ref 150–400)
RBC: 2.88 MIL/uL — ABNORMAL LOW (ref 4.22–5.81)
RDW: 18 % — ABNORMAL HIGH (ref 11.5–15.5)
WBC: 12.3 10*3/uL — ABNORMAL HIGH (ref 4.0–10.5)
nRBC: 0 % (ref 0.0–0.2)

## 2020-08-09 LAB — BASIC METABOLIC PANEL
Anion gap: 9 (ref 5–15)
BUN: 14 mg/dL (ref 8–23)
CO2: 28 mmol/L (ref 22–32)
Calcium: 8.3 mg/dL — ABNORMAL LOW (ref 8.9–10.3)
Chloride: 95 mmol/L — ABNORMAL LOW (ref 98–111)
Creatinine, Ser: 0.77 mg/dL (ref 0.61–1.24)
GFR, Estimated: >60 mL/min (ref >60)
Glucose, Bld: 104 mg/dL — ABNORMAL HIGH (ref 70–99)
Potassium: 3.8 mmol/L (ref 3.5–5.1)
Sodium: 132 mmol/L — ABNORMAL LOW (ref 135–145)

## 2020-08-09 LAB — PREPARE RBC (CROSSMATCH)

## 2020-08-09 LAB — HEMOGLOBIN, FINGERSTICK: Hemoglobin: 6.5 g/dL — CL (ref 12.6–17.7)

## 2020-08-09 LAB — POC OCCULT BLOOD, ED: Fecal Occult Bld: NEGATIVE

## 2020-08-09 MED ORDER — ATORVASTATIN CALCIUM 40 MG PO TABS
40.0000 mg | ORAL_TABLET | Freq: Every day | ORAL | Status: DC
  Administered 2020-08-09 – 2020-08-10 (×2): 40 mg via ORAL
  Filled 2020-08-09 (×2): qty 1

## 2020-08-09 MED ORDER — FENTANYL CITRATE (PF) 100 MCG/2ML IJ SOLN
50.0000 ug | Freq: Once | INTRAMUSCULAR | Status: AC
Start: 2020-08-09 — End: 2020-08-09
  Administered 2020-08-09: 50 ug via INTRAVENOUS
  Filled 2020-08-09: qty 2

## 2020-08-09 MED ORDER — SODIUM CHLORIDE 0.9 % IV SOLN
10.0000 mL/h | Freq: Once | INTRAVENOUS | Status: AC
  Administered 2020-08-10: 10 mL/h via INTRAVENOUS

## 2020-08-09 MED ORDER — INSULIN ASPART 100 UNIT/ML IJ SOLN
0.0000 [IU] | Freq: Every day | INTRAMUSCULAR | Status: DC
  Administered 2020-08-10: 2 [IU] via SUBCUTANEOUS

## 2020-08-09 MED ORDER — INSULIN GLARGINE-YFGN 100 UNIT/ML ~~LOC~~ SOLN: 10.0000 [IU] | Freq: Every day | SUBCUTANEOUS | Status: DC

## 2020-08-09 MED ORDER — CEPHALEXIN 500 MG PO CAPS
500.0000 mg | ORAL_CAPSULE | Freq: Three times a day (TID) | ORAL | Status: DC
  Administered 2020-08-09 – 2020-08-10 (×2): 500 mg via ORAL
  Filled 2020-08-09 (×3): qty 1

## 2020-08-09 MED ORDER — HYDROCODONE-ACETAMINOPHEN 5-325 MG PO TABS
1.0000 | ORAL_TABLET | Freq: Four times a day (QID) | ORAL | Status: DC | PRN
  Administered 2020-08-09 – 2020-08-10 (×3): 1 via ORAL
  Filled 2020-08-09 (×3): qty 1

## 2020-08-09 MED ORDER — INSULIN ASPART 100 UNIT/ML IJ SOLN
0.0000 [IU] | Freq: Three times a day (TID) | INTRAMUSCULAR | Status: DC
  Administered 2020-08-10 – 2020-08-11 (×2): 1 [IU] via SUBCUTANEOUS

## 2020-08-09 MED ORDER — SODIUM CHLORIDE 0.9 % IV BOLUS
500.0000 mL | Freq: Once | INTRAVENOUS | Status: AC
  Administered 2020-08-09: 500 mL via INTRAVENOUS

## 2020-08-09 MED ORDER — IOHEXOL 300 MG/ML  SOLN
100.0000 mL | Freq: Once | INTRAMUSCULAR | Status: AC | PRN
  Administered 2020-08-09: 100 mL via INTRAVENOUS

## 2020-08-09 MED ORDER — SITAGLIPTIN PHOSPHATE 50 MG PO TABS: 50.0000 mg | ORAL_TABLET | Freq: Every day | ORAL | 2 refills | Status: DC

## 2020-08-09 MED ORDER — FOLIC ACID 1 MG PO TABS
1.0000 mg | ORAL_TABLET | Freq: Every day | ORAL | Status: DC
  Administered 2020-08-09 – 2020-08-11 (×4): 1 mg via ORAL
  Filled 2020-08-09 (×4): qty 1

## 2020-08-09 MED ORDER — APIXABAN 5 MG PO TABS
5.0000 mg | ORAL_TABLET | Freq: Two times a day (BID) | ORAL | Status: DC
  Administered 2020-08-10 – 2020-08-11 (×3): 5 mg via ORAL
  Filled 2020-08-09 (×3): qty 1

## 2020-08-09 NOTE — H&P (Signed)
TRH H&P   Patient Demographics:    George Anthony, is a 70 y.o. male  MRN: [id number]   DOB - 1950-07-31  Admit Date - 08/09/2020  Outpatient Primary MD for the patient is Loman Brooklyn, FNP  Referring MD/NP/PA: Dr Reather Converse  Patient coming from: home  No chief complaint on file.     HPI:    George Anthony  is a 70 y.o. male, with past medical history of diabetes mellitus, hypertension, alcohol abuse, anemia, recent admission couple months ago for right lower extremity DVT on Eliquis, patient was sent by his PCP for evaluation for anemia, fall and head trauma, but he felt lightheaded, weak, but he had a fall, and hit his head, so he was told by PCP to come for evaluation, he denies any syncope, chest pain or shortness of breath, report he is compliant with Eliquis. - in ED CT head/cervical spine with no evidence of bleed or head trauma, hemoglobin low at 6.5 (most recent baseline around 8), he was Hemoccult negative was ordered units PRBC in ED and [facility] hospitalist consulted to admit.    Review of systems:    In addition to the HPI above,  No Fever-chills, he reports dizziness, lightheadedness and generalized weakness, he does report weight loss. No Headache, No changes with Vision or hearing, No problems swallowing food or Liquids, No Chest pain, Cough or Shortness of Breath, No Abdominal pain, No Nausea or Vommitting, Bowel movements are regular, No Blood in stool or Urine, No dysuria, No new skin rashes or bruises, No new joints pains-aches,  No new weakness, tingling, numbness in any extremity, No recent weight gain or loss, No polyuria, polydypsia or polyphagia, No significant Mental Stressors.  A full 10 point Review of Systems was done, except as stated above, all other Review of Systems were negative.   With Past History of the following :    Past Medical History:   Diagnosis Date   Diabetes mellitus without complication ([place])    Enlarged heart    Hyperlipidemia    Hypertension       Past Surgical History:  Procedure Laterality Date   BIOPSY  09/16/2019   Procedure: BIOPSY;  Surgeon: Eloise Harman, DO;  [facility];  Service: Endoscopy;;   COLONOSCOPY WITH PROPOFOL N/A 09/16/2019   Procedure: COLONOSCOPY WITH PROPOFOL;  Surgeon: Eloise Harman, DO;  [facility];  Service: Endoscopy;  Laterality: N/A;  7:30am   POLYPECTOMY  09/16/2019   Procedure: POLYPECTOMY;  Surgeon: Eloise Harman, DO;  [facility];  Service: Endoscopy;;      Social History:     Social History   Tobacco Use   Smoking status: Every Day    Packs/day: 1.00    Years: 40.00    Pack years: 40.00    Types: Cigarettes   Smokeless tobacco: Never  Substance  Use Topics   Alcohol use: Yes    Alcohol/week: 3.0 standard drinks    Types: 3 Cans of beer per week    Comment: drinking a couple times a month for the last year.- used to drink 2 12 packs a week       Family History :     Family History  Problem Relation Age of Onset   Emphysema Mother    Drug abuse Sister    Colon cancer Neg Hx     Home Medications:   Prior to Admission medications   Medication Sig Start Date End Date Taking? Authorizing Provider  acetaminophen (TYLENOL) 500 MG tablet Take 1,000 mg by mouth every 6 (six) hours as needed for moderate pain.   Yes [provider]  apixaban (ELIQUIS) 5 MG TABS tablet Take 1 tablet (5 mg total) by mouth 2 (two) times daily. 06/09/20 09/07/20 Yes Shahmehdi, Seyed A, MD  atorvastatin (LIPITOR) 40 MG tablet Take 1 tablet (40 mg total) by mouth at bedtime. 03/22/20  Yes Hendricks Limes F, FNP  ferrous sulfate 325 (65 FE) MG tablet Take 1 tablet (325 mg total) by mouth 2 (two) times daily with a meal. 06/03/20 08/09/20 Yes Shahmehdi, Seyed A, MD  Insulin Glargine-yfgn (SEMGLEE, YFGN,) 100 UNIT/ML SOLN Inject 15 Units into  the skin daily. 05/10/20  Yes Loman Brooklyn, FNP  losartan (COZAAR) 25 MG tablet Take 1 tablet (25 mg total) by mouth daily. 03/22/20  Yes Hendricks Limes F, FNP  metFORMIN (GLUCOPHAGE) 1000 MG tablet Take 1 tablet (1,000 mg total) by mouth 2 (two) times daily with a meal. 03/22/20  Yes Hendricks Limes F, FNP  B-D UF III MINI PEN NEEDLES 31G X 5 MM MISC Use with insulin daily Dx E11.69 10/20/19   Loman Brooklyn, FNP  glucose blood test strip Check TID and PRN dx E11.9 02/17/19   Rakes, Connye Burkitt, FNP  sitaGLIPtin (JANUVIA) 50 MG tablet Take 1 tablet (50 mg total) by mouth daily. 08/09/20   Loman Brooklyn, FNP     Allergies:    No Known Allergies   Physical Exam:   Vitals  Blood pressure 136/70, pulse 83, temperature 98.5 F (36.9 C), temperature source Oral, resp. rate 18, SpO2 96 %.   1. General developed male, frail, laying in bed in no apparent distress.  2. Normal affect and insight, Not Suicidal or Homicidal, Awake Alert, Oriented X 3.  3. No F.N deficits, ALL C.Nerves Intact, Strength 5/5 all 4 extremities, Sensation intact all 4 extremities, Plantars down going.  4. Ears and Eyes appear Normal, Conjunctivae clear, PERRLA. Moist Oral Mucosa.  [place] with posterior left-sided scalp hematoma.  5. Supple Neck, No JVD, No cervical lymphadenopathy appriciated, No Carotid Bruits.  6. Symmetrical Chest wall movement, Good air movement bilaterally, CTAB.  7. RRR, No Gallops, Rubs or Murmurs, No Parasternal Heave.lower ext edema  8. Positive Bowel Sounds, Abdomen Soft, No tenderness, No organomegaly appriciated,No rebound -guarding or rigidity.  9.  No Cyanosis, Normal Skin Turgor, No Skin Rash or Bruise.  10. Good muscle tone,  joints appear normal , no effusions, Normal ROM.      Data Review:    CBC Recent Labs  Lab 08/09/20 1130  WBC 12.3*  HGB 6.5*  HCT 23.8*  PLT 546*  MCV 82.6  MCH 22.6*  MCHC 27.3*  RDW 18.0*  LYMPHSABS 4.2*  MONOABS 0.7  EOSABS 0.1   BASOSABS 0.0   ------------------------------------------------------------------------------------------------------------------  Chemistries  Recent Labs  Lab 08/09/20 1130  NA 132*  K 3.8  CL 95*  CO2 28  GLUCOSE 104*  BUN 14  CREATININE 0.77  CALCIUM 8.3*   ------------------------------------------------------------------------------------------------------------------ estimated creatinine clearance is 81.5 mL/min (by C-G formula based on SCr of 0.77 mg/dL). ------------------------------------------------------------------------------------------------------------------ No results for input(s): TSH, T4TOTAL, T3FREE, THYROIDAB in the last 72 hours.  Invalid input(s): FREET3  Coagulation profile No results for input(s): INR, PROTIME in the last 168 hours. ------------------------------------------------------------------------------------------------------------------- No results for input(s): DDIMER in the last 72 hours. -------------------------------------------------------------------------------------------------------------------  Cardiac Enzymes No results for input(s): CKMB, TROPONINI, MYOGLOBIN in the last 168 hours.  Invalid input(s): CK ------------------------------------------------------------------------------------------------------------------    Component Value Date/Time   BNP 82.0 06/01/2020 1258     ---------------------------------------------------------------------------------------------------------------  Urinalysis    Component Value Date/Time   COLORURINE YELLOW 11/19/2011 0158   APPEARANCEUR CLEAR 11/19/2011 0158   LABSPEC >1.030 (H) 11/19/2011 0158   PHURINE 6.0 11/19/2011 0158   GLUCOSEU NEGATIVE 11/19/2011 0158   HGBUR MODERATE (A) 11/19/2011 0158   BILIRUBINUR MODERATE (A) 11/19/2011 0158   KETONESUR 40 (A) 11/19/2011 0158   PROTEINUR 30 (A) 11/19/2011 0158   UROBILINOGEN 2.0 (H) 11/19/2011 0158   NITRITE NEGATIVE  11/19/2011 0158   LEUKOCYTESUR NEGATIVE 11/19/2011 0158    ----------------------------------------------------------------------------------------------------------------   Imaging Results:    CT Head Wo Contrast  Result Date: 08/09/2020 CLINICAL DATA:  Head trauma, mod-severe fall EXAM: CT HEAD WITHOUT CONTRAST TECHNIQUE: Contiguous axial images were obtained from the base of the skull through the vertex without intravenous contrast. COMPARISON:  None. FINDINGS: Brain: No evidence of acute infarction, hemorrhage, hydrocephalus, extra-axial collection or mass lesion/mass effect.The ventricles are normal in size. Vascular: No hyperdense vessel or unexpected calcification. Skull: Normal. Negative for fracture or focal lesion. Sinuses/Orbits: Scattered paranasal sinus mucosal thickening. Other: There is a moderate size left posterior scalp hematoma. IMPRESSION: No acute intracranial abnormality. Electronically Signed   By: Maurine Simmering   On: 08/09/2020 17:23   CT Cervical Spine Wo Contrast  Result Date: 08/09/2020 CLINICAL DATA:  neck pain fall.fall 2 days ago. Patient has knot on back of head. Denies LOC. Scan delayed, RN needed to start blood prior to coming to CT. EXAM: CT CERVICAL SPINE WITHOUT CONTRAST TECHNIQUE: Multidetector CT imaging of the cervical spine was performed without intravenous contrast. Multiplanar CT image reconstructions were also generated. COMPARISON:  None. FINDINGS: Alignment: Normal. Skull base and vertebrae: Multilevel mild degenerative changes of the spine. No acute fracture. No aggressive appearing focal osseous lesion or focal pathologic process. Soft tissues and spinal canal: No prevertebral fluid or swelling. No visible canal hematoma. Upper chest: Unremarkable. Other: Left apical cystic changes. IMPRESSION: No acute displaced fracture or traumatic listhesis of the cervical spine. Electronically Signed   By: Iven Finn M.D.   On: 08/09/2020 17:48      Assessment &  Plan:    Active Problems:   Cigarette nicotine dependence without complication   DM type 2 with diabetic dyslipidemia ([place])   Hyperlipidemia associated with type 2 diabetes mellitus ([place])   Hypertension associated with type 2 diabetes mellitus ([facility])   Anemia   Acute DVT (deep venous thrombosis) ([facility])   Symptomatic anemia    Symptomatic anemia -This is patient's second admission over last few months for anemia, reports he is compliant with his iron, he is Hemoccult negative, colonoscopy last year significant only for polyps/diverticulosis. -Symptomatic presents with falls, generalized weakness and lightheadedness, will be transfused units PRBC. -Work-up during recent previous hospitalization significant for folate deficiency  and iron deficiency anemia, will start on folic acid( to continue on  discharge) and will need IV iron transfusion tomorrow. -Patient reports unintentional weight loss, is a smoker, as well he is with recent diagnosis of unprovoked DVT concern for malignancy contributing to his anemia, will obtain CT chest/abdomen/pelvis. -I have discussed with the patient and wife at bedside, that neck step of evaluation will be endoscopy, and this can be done as an outpatient, they are aware of this and they will follow with [name] as an outpatient after discharge.  Lower extremity DVT -Continue with home dose Eliquis  Fall -With head trauma, CT head/cervical spine with no bleed, patient with significant posterior scalp hematoma, will start on Keflex. - will cosnult PT/OT  Diabetes mellitus, type II, insulin-dependent -Resume Lantus at a lower dose 15>10 units, will add insulin sliding scale, hold metformin as he will receive IV contrast.  Hyperlipidemia -Continue with atorvastatin   Hypertension -Blood pressure currently acceptable, will hold on resuming his home dose losartan, this can be resumed after transfusion when blood pressure started to increase.   DVT  Prophylaxis on Eliquis  AM Labs Ordered, also please review Full Orders  Family Communication: Admission, patients condition and plan of care including tests being ordered have been discussed with the patient and wife* who indicate understanding and agree with the plan and Code Status.  Code Status full  Likely DC to  home  Condition GUARDED    Consults called: none    Admission status: observation    Time spent in minutes : 60 minutes   Phillips Climes M.D on 08/09/2020 at 9:29 PM   [facility] - Office  [phone]

## 2020-08-09 NOTE — ED Notes (Signed)
Patient transported to CT 

## 2020-08-09 NOTE — ED Provider Notes (Addendum)
[facility] Provider Note   CSN: [id number] Arrival date & time: 08/09/20  1103     History No chief complaint on file.   George Anthony is a 70 y.o. male.  Patient with history of right leg DVT on Eliquis, high blood pressure, diabetes, alcohol abuse presents with worsening anemia and head injury.  Patient felt lightheaded and weak leading to him hitting his head 2 days ago.  Patient denies other injuries.  No syncope.  No chest pain or shortness of breath.  Patient is felt generally weak recently.  Patient is required blood transfusion in the past.  Patient denies any blood in the stools.  Patient has had a colonoscopy in the past and was told he has a few polyps that were removed.      Past Medical History:  Diagnosis Date   Diabetes mellitus without complication ([place])    Enlarged heart    Hyperlipidemia    Hypertension     Patient Active Problem List   Diagnosis Date Noted   Acute DVT (deep venous thrombosis) ([place]) 06/01/2020   Anemia 07/14/2019   Positive colorectal cancer screening using Cologuard test 07/14/2019   Constipation 07/14/2019   Hyperlipidemia associated with type 2 diabetes mellitus ([place]) 08/26/2018   Hypertension associated with type 2 diabetes mellitus ([place]) 08/26/2018   Cigarette nicotine dependence without complication 33/82/5053   Primary osteoarthritis 01/03/2016   Allergic rhinitis 06/22/2014   DM type 2 with diabetic dyslipidemia ([place]) 09/15/2013   Alcohol abuse 11/19/2011   Fatty liver 11/19/2011    Past Surgical History:  Procedure Laterality Date   BIOPSY  09/16/2019   Procedure: BIOPSY;  Surgeon: Eloise Harman, DO;  [facility];  Service: Endoscopy;;   COLONOSCOPY WITH PROPOFOL N/A 09/16/2019   Procedure: COLONOSCOPY WITH PROPOFOL;  Surgeon: Eloise Harman, DO;  [facility];  Service: Endoscopy;  Laterality: N/A;  7:30am   POLYPECTOMY  09/16/2019   Procedure: POLYPECTOMY;  Surgeon: Eloise Harman, DO;  [facility];  Service: Endoscopy;;       Family History  Problem Relation Age of Onset   Emphysema Mother    Drug abuse Sister    Colon cancer Neg Hx     Social History   Tobacco Use   Smoking status: Every Day    Packs/day: 1.00    Years: 40.00    Pack years: 40.00    Types: Cigarettes   Smokeless tobacco: Never  Vaping Use   Vaping Use: Never used  Substance Use Topics   Alcohol use: Yes    Alcohol/week: 3.0 standard drinks    Types: 3 Cans of beer per week    Comment: drinking a couple times a month for the last year.- used to drink 2 12 packs a week   Drug use: No    Home Medications Prior to Admission medications   Medication Sig Start Date End Date Taking? Authorizing Provider  acetaminophen (TYLENOL) 500 MG tablet Take 1,000 mg by mouth every 6 (six) hours as needed for moderate pain.   Yes [provider]  apixaban (ELIQUIS) 5 MG TABS tablet Take 1 tablet (5 mg total) by mouth 2 (two) times daily. 06/09/20 09/07/20 Yes Shahmehdi, Seyed A, MD  atorvastatin (LIPITOR) 40 MG tablet Take 1 tablet (40 mg total) by mouth at bedtime. 03/22/20  Yes Hendricks Limes F, FNP  ferrous sulfate 325 (65 FE) MG tablet Take 1 tablet (325 mg total) by mouth  2 (two) times daily with a meal. 06/03/20 08/09/20 Yes Shahmehdi, Seyed A, MD  Insulin Glargine-yfgn (SEMGLEE, YFGN,) 100 UNIT/ML SOLN Inject 15 Units into the skin daily. 05/10/20  Yes Loman Brooklyn, FNP  losartan (COZAAR) 25 MG tablet Take 1 tablet (25 mg total) by mouth daily. 03/22/20  Yes Hendricks Limes F, FNP  metFORMIN (GLUCOPHAGE) 1000 MG tablet Take 1 tablet (1,000 mg total) by mouth 2 (two) times daily with a meal. 03/22/20  Yes Hendricks Limes F, FNP  B-D UF III MINI PEN NEEDLES 31G X 5 MM MISC Use with insulin daily Dx E11.69 10/20/19   Loman Brooklyn, FNP  glucose blood test strip Check TID and PRN dx E11.9 02/17/19   Rakes, Connye Burkitt, FNP  sitaGLIPtin (JANUVIA) 50 MG tablet Take 1 tablet (50 mg  total) by mouth daily. 08/09/20   Loman Brooklyn, FNP    Allergies    Patient has no known allergies.  Review of Systems   Review of Systems  Constitutional:  Negative for chills and fever.  HENT:  Negative for congestion.   Eyes:  Negative for visual disturbance.  Respiratory:  Negative for shortness of breath.   Cardiovascular:  Negative for chest pain.  Gastrointestinal:  Negative for abdominal pain, blood in stool and vomiting.  Genitourinary:  Negative for dysuria and flank pain.  Musculoskeletal:  Negative for back pain, neck pain and neck stiffness.  Skin:  Negative for rash.  Neurological:  Positive for light-headedness and headaches.   Physical Exam Updated Vital Signs BP 124/71   Pulse 90   Temp 98.2 F (36.8 C) (Oral)   Resp 20   SpO2 94%   Physical Exam Vitals and nursing note reviewed.  Constitutional:      General: He is not in acute distress.    Appearance: He is well-developed.  HENT:     Head: Normocephalic.     Mouth/Throat:     Mouth: Mucous membranes are moist.  Eyes:     General:        Right eye: No discharge.        Left eye: No discharge.     Conjunctiva/sclera: Conjunctivae normal.  Neck:     Trachea: No tracheal deviation.  Cardiovascular:     Rate and Rhythm: Normal rate and regular rhythm.     Heart sounds: No murmur heard. Pulmonary:     Effort: Pulmonary effort is normal.     Breath sounds: Normal breath sounds.  Abdominal:     General: There is no distension.     Palpations: Abdomen is soft.     Tenderness: There is no abdominal tenderness. There is no guarding.  Musculoskeletal:        General: Swelling (RLE) present. Normal range of motion.     Cervical back: Normal range of motion and neck supple. No rigidity.  Skin:    General: Skin is warm.     Capillary Refill: Capillary refill takes less than 2 seconds.     Coloration: Skin is pale.     Findings: No rash.  Neurological:     General: No focal deficit present.      Mental Status: He is alert.     Cranial Nerves: No cranial nerve deficit.     Motor: No weakness.  Psychiatric:        Mood and Affect: Mood normal.    ED Results / Procedures / Treatments   Labs (all labs ordered are listed, but only  abnormal results are displayed) Labs Reviewed  BASIC METABOLIC PANEL - Abnormal; Notable for the following components:      Result Value   Sodium 132 (*)    Chloride 95 (*)    Glucose, Bld 104 (*)    Calcium 8.3 (*)    All other components within normal limits  CBC WITH DIFFERENTIAL/PLATELET - Abnormal; Notable for the following components:   WBC 12.3 (*)    RBC 2.88 (*)    Hemoglobin 6.5 (*)    HCT 23.8 (*)    MCH 22.6 (*)    MCHC 27.3 (*)    RDW 18.0 (*)    Platelets 546 (*)    Lymphs Abs 4.2 (*)    Abs Immature Granulocytes 0.08 (*)    All other components within normal limits  RESP PANEL BY RT-PCR (FLU A&B, COVID) ARPGX2  POC OCCULT BLOOD, ED  TYPE AND SCREEN  PREPARE RBC (CROSSMATCH)    EKG None  Radiology No results found.  Procedures Procedures  CRITICAL CARE Performed by: Mariea Clonts   Total critical care time: 35 minutes  Critical care time was exclusive of separately billable procedures and treating other patients.  Critical care was necessary to treat or prevent imminent or life-threatening deterioration.  Critical care was time spent personally by me on the following activities: development of treatment plan with patient and/or surrogate as well as nursing, discussions with consultants, evaluation of patient's response to treatment, examination of patient, obtaining history from patient or surrogate, ordering and performing treatments and interventions, ordering and review of laboratory studies, ordering and review of radiographic studies, pulse oximetry and re-evaluation of patient's condition.  Medications Ordered in ED Medications  0.9 %  sodium chloride infusion (0 mL/hr Intravenous Hold 08/09/20 1521)   sodium chloride 0.9 % bolus 500 mL (0 mLs Intravenous Stopped 08/09/20 1516)  fentaNYL (SUBLIMAZE) injection 50 mcg (50 mcg Intravenous Given 08/09/20 1415)    ED Course  I have reviewed the triage vital signs and the nursing notes.  Pertinent labs & imaging results that were available during my care of the patient were reviewed by me and considered in my medical decision making (see chart for details).    MDM Rules/Calculators/A&P                           Patient presents with worsening anemia being followed outpatient.  Repeated hemoglobin showed 6.5, no symptoms 2 units of packed red blood cells ordered discussed risk and benefits with patient.  Other blood work showed mild hyponatremia 132 likely combination of decreased oral intake and alcohol use.  CT scan head and neck pending.  Patient denies any active bleeding, Hemoccult ordered.  Discussed with hospitalist requesting callback with Hemoccult results and CT scan results and then he will admit the patient.  IV fluid bolus and fentanyl ordered.  Final Clinical Impression(s) / ED Diagnoses Final diagnoses:  Hyponatremia  Symptomatic anemia    Rx / DC Orders ED Discharge Orders     None        Elnora Morrison, MD 08/09/20 1650    Elnora Morrison, MD 10/24/20 1512

## 2020-08-09 NOTE — ED Provider Notes (Signed)
Pt signed out by Dr. Reather Converse pending CT head/C-spine:  C-spine:   IMPRESSION:  No acute displaced fracture or traumatic listhesis of the cervical  spine.      CT head:  IMPRESSION:  No acute intracranial abnormality.   I also checked pt's stool.  It is guaiac negative.  Pt d/w Dr. [facility] ([facility]) for admission.   Isla Pence, MD 08/09/20 1850

## 2020-08-09 NOTE — Progress Notes (Signed)
Assessment & Plan:  1-3. Injury of head, initial encounter/Contusion of scalp, initial encounter/Multiple falls Discussed with patient that he needs to go to the ER due to hitting his head when he fell so that he can get immediate imaging of his head. He is on a blood thinner, which could further complicate things.  4. Symptomatic anemia He secondly needs to go to the hospital due to hgb of 6.5. Explained he needs a blood transfusion.  - Anemia Profile B - Hemoglobin, fingerstick  5. Type 2 diabetes mellitus with hypoglycemia without coma, with long-term current use of insulin (HCC) Lab Results  Component Value Date   HGBA1C 6.6 (H) 06/02/2020   HGBA1C 7.9 (H) 03/22/2020   HGBA1C 6.6 08/25/2019    - Diabetes is at goal of A1c < 7. - Medications: continue Lantus and Metformin. D/C Glipizide due to episodes of hypoglycemia. Started Januvia. - Home glucose monitoring: continue monitoring. Patient declined CGM. - Patient is currently taking a statin. Patient is taking an ACE-inhibitor/ARB.  - Instruction/counseling given: reminded to get eye exam  Diabetes Health Maintenance Due  Topic Date Due   OPHTHALMOLOGY EXAM  Never done   URINE MICROALBUMIN  08/24/2020   HEMOGLOBIN A1C  12/03/2020   FOOT EXAM  03/22/2021    - sitaGLIPtin (JANUVIA) 50 MG tablet; Take 1 tablet (50 mg total) by mouth daily.  Dispense: 30 tablet; Refill: 2 - Anemia Profile B - CMP14+EGFR - Lipid panel  6. Hypertension associated with type 2 diabetes mellitus ([place]) Well controlled on current regimen.  - Anemia Profile B - CMP14+EGFR - Lipid panel  7. Hyperlipidemia associated with type 2 diabetes mellitus ([place]) Labs to assess. On a statin. - CMP14+EGFR - Lipid panel   Patient not agreeable to me calling EMS to transport him to the ER. Wife is adamant that he is going.   Hendricks Limes, MSN, APRN, FNP-C [facility]  Subjective:    Patient ID: George Anthony, male    DOB:  1950-09-12, 70 y.o.   MRN: [id number]  Patient Care Team: Loman Brooklyn, FNP as PCP - General (Family Medicine) Eloise Harman, DO as Consulting Physician (Internal Medicine) Harlen Labs, MD as Referring Physician (Optometry)   Chief Complaint:  Chief Complaint  Patient presents with   Diabetes   Hypertension    Check up of chronic medical conditions     Weakness    X 2 months. Patient also states that he has fell 2 times at work since then and not has a knot on his head.  When he feel Tuesday night he felt his legs getting weak and then had a fall.    Anorexia    Patient has been having loss of appetite since he was in the hospital x 2 months ago      HPI: George Anthony is a 70 y.o. 70 presenting on 08/09/2020 for Diabetes, Hypertension (Check up of chronic medical conditions /), Weakness (X 2 months. Patient also states that he has fell 2 times at work since then and not has a knot on his head.  When he feel Tuesday night he felt his legs getting weak and then had a fall. ), and Anorexia (Patient has been having loss of appetite since he was in the hospital x 2 months ago  )  Patient is accompanied by his wife, who he is okay with being present.   Diabetes: Patient presents for follow up of diabetes. Current symptoms include:  hypoglycemia with blood sugars as low as 43-50s . Known diabetic complications: nephropathy. Medication compliance: yes, unless his blood sugar is low - then he doesn't take anything. Current diet:  not eating much of anything due to decreased appetite and nausea for the past 2 months . Current exercise: none. Home blood sugar records: symptomatic hypoglycemia occurs during the day . Is he  on ACE inhibitor or angiotensin II receptor blocker? Yes. Is he on a statin? Yes.   New complaints: Patient reports weakness for the past two months. He has fallen twice since his last hospitalization. The first was ~3 weeks ago when he felt like he slipped on cotton at  work. The second time was two days ago at work when he felt weak and nauseated, at that time he states his legs started shaking and he fell. He did hit his head when he fell and has a goose-egg. Denies loss of consciousness. He reports a headache since hitting his head. No change in vision.    Social history:  Relevant past medical, surgical, family and social history reviewed and updated as indicated. Interim medical history since our last visit reviewed.  Allergies and medications reviewed and updated.  DATA REVIEWED: CHART IN EPIC  ROS: Negative unless specifically indicated above in HPI.    Current Outpatient Medications:    acetaminophen (TYLENOL) 500 MG tablet, Take 1,000 mg by mouth every 6 (six) hours as needed for moderate pain., Disp: , Rfl:    apixaban (ELIQUIS) 5 MG TABS tablet, Take 1 tablet (5 mg total) by mouth 2 (two) times daily., Disp: 180 tablet, Rfl: 0   atorvastatin (LIPITOR) 40 MG tablet, Take 1 tablet (40 mg total) by mouth at bedtime., Disp: 90 tablet, Rfl: 1   B-D UF III MINI PEN NEEDLES 31G X 5 MM MISC, Use with insulin daily Dx E11.69, Disp: 100 each, Rfl: 3   glipiZIDE (GLUCOTROL) 5 MG tablet, Take 1 tablet (5 mg total) by mouth 2 (two) times daily before a meal., Disp: 60 tablet, Rfl: 0   glucose blood test strip, Check TID and PRN dx E11.9, Disp: 100 each, Rfl: 11   Insulin Glargine-yfgn (SEMGLEE, YFGN,) 100 UNIT/ML SOLN, Inject 15 Units into the skin daily., Disp: 10 mL, Rfl: 1   losartan (COZAAR) 25 MG tablet, Take 1 tablet (25 mg total) by mouth daily., Disp: 90 tablet, Rfl: 1   metFORMIN (GLUCOPHAGE) 1000 MG tablet, Take 1 tablet (1,000 mg total) by mouth 2 (two) times daily with a meal., Disp: 180 tablet, Rfl: 1   ferrous sulfate 325 (65 FE) MG tablet, Take 1 tablet (325 mg total) by mouth 2 (two) times daily with a meal., Disp: 60 tablet, Rfl: 3   No Known Allergies Past Medical History:  Diagnosis Date   Diabetes mellitus without complication ([place])     Enlarged heart    Hyperlipidemia    Hypertension     Past Surgical History:  Procedure Laterality Date   BIOPSY  09/16/2019   Procedure: BIOPSY;  Surgeon: Eloise Harman, DO;  Location: [facility];  Service: Endoscopy;;   COLONOSCOPY WITH PROPOFOL N/A 09/16/2019   Procedure: COLONOSCOPY WITH PROPOFOL;  Surgeon: Eloise Harman, DO;  Location: [facility];  Service: Endoscopy;  Laterality: N/A;  7:30am   POLYPECTOMY  09/16/2019   Procedure: POLYPECTOMY;  Surgeon: Eloise Harman, DO;  Location: [facility];  Service: Endoscopy;;    Social History   Socioeconomic History   Marital status:  Married    Spouse name: charlene   Number of children: 1   Years of education: Not on file   Highest education level: Not on file  Occupational History    Employer: [employer]  Tobacco Use   Smoking status: Every Day    Packs/day: 1.00    Years: 40.00    Pack years: 40.00    Types: Cigarettes   Smokeless tobacco: Never  Vaping Use   Vaping Use: Never used  Substance and Sexual Activity   Alcohol use: Yes    Alcohol/week: 3.0 standard drinks    Types: 3 Cans of beer per week    Comment: drinking a couple times a month for the last year.- used to drink 2 12 packs a week   Drug use: No   Sexual activity: Yes  Other Topics Concern   Not on file  Social History Narrative   Not on file   Social Determinants of Health   Financial Resource Strain: Not on file  Food Insecurity: Not on file  Transportation Needs: Not on file  Physical Activity: Not on file  Stress: Not on file  Social Connections: Not on file  Intimate Partner Violence: Not on file        Objective:    BP 131/73   Pulse 93   Temp 97.8 F (36.6 C) (Temporal)   Ht 5' 7" (1.702 m)   Wt 174 lb 6.4 oz (79.1 kg)   SpO2 99%   BMI 27.31 kg/m   Wt Readings from Last 3 Encounters:  08/09/20 174 lb 6.4 oz (79.1 kg)  06/01/20 187 lb 4 oz (84.9 kg)  03/22/20 183 lb 3.2 oz (83.1 kg)    Physical  Exam Vitals reviewed.  Constitutional:      General: He is not in acute distress.    Appearance: Normal appearance. He is overweight. He is not ill-appearing, toxic-appearing or diaphoretic.  HENT:     Head: Normocephalic and atraumatic.     Comments: Hematoma left/back side of scalp with dried blood. Eyes:     General: No scleral icterus.       Right eye: No discharge.        Left eye: No discharge.     Conjunctiva/sclera: Conjunctivae normal.  Cardiovascular:     Rate and Rhythm: Normal rate and regular rhythm.     Heart sounds: Murmur heard.    No friction rub. No gallop.  Pulmonary:     Effort: Pulmonary effort is normal. No respiratory distress.     Breath sounds: Normal breath sounds. No stridor. No wheezing, rhonchi or rales.  Musculoskeletal:        General: Normal range of motion.     Cervical back: Normal range of motion.  Skin:    General: Skin is warm and dry.  Neurological:     Mental Status: He is alert and oriented to person, place, and time. Mental status is at baseline.     Motor: Motor function is intact.     Coordination: Coordination is intact.     Gait: Gait is intact.  Psychiatric:        Mood and Affect: Mood normal.        Behavior: Behavior normal.        Thought Content: Thought content normal.        Judgment: Judgment normal.    Lab Results  Component Value Date   TSH 1.560 08/26/2018   Lab Results  Component Value Date  WBC 12.4 (H) 06/02/2020   HGB 8.2 (L) 06/02/2020   HCT 28.7 (L) 06/02/2020   MCV 80.5 06/02/2020   PLT 452 (H) 06/02/2020   Lab Results  Component Value Date   NA 133 (L) 06/02/2020   K 4.1 06/02/2020   CO2 29 06/02/2020   GLUCOSE 135 (H) 06/02/2020   BUN 10 06/02/2020   CREATININE 0.61 06/02/2020   BILITOT 0.2 03/22/2020   ALKPHOS 149 (H) 03/22/2020   AST 24 03/22/2020   ALT 42 03/22/2020   PROT 7.0 03/22/2020   ALBUMIN 3.1 (L) 03/22/2020   CALCIUM 9.2 06/02/2020   ANIONGAP 10 06/02/2020   EGFR 103  03/22/2020   Lab Results  Component Value Date   CHOL 133 03/22/2020   Lab Results  Component Value Date   HDL 41 03/22/2020   Lab Results  Component Value Date   LDLCALC 78 03/22/2020   Lab Results  Component Value Date   TRIG 66 03/22/2020   Lab Results  Component Value Date   CHOLHDL 3.2 03/22/2020   Lab Results  Component Value Date   HGBA1C 6.6 (H) 06/02/2020

## 2020-08-09 NOTE — ED Notes (Signed)
Date and time results received: 08/09/20 1355 (use smartphrase ".now" to insert current time)  Test: Heme Critical Value: 6.5  Name of Provider Notified: Reather Converse

## 2020-08-09 NOTE — ED Triage Notes (Signed)
Sent from PCP for evaluation of low hemoglobin and CT scan of head  due to fall 2 days ago. Patient has knot on back of head. Denies LOC

## 2020-08-10 DIAGNOSIS — Z79899 Other long term (current) drug therapy: Secondary | ICD-10-CM | POA: Diagnosis not present

## 2020-08-10 DIAGNOSIS — Z7901 Long term (current) use of anticoagulants: Secondary | ICD-10-CM | POA: Diagnosis not present

## 2020-08-10 DIAGNOSIS — Z20822 Contact with and (suspected) exposure to covid-19: Secondary | ICD-10-CM | POA: Diagnosis present

## 2020-08-10 DIAGNOSIS — E871 Hypo-osmolality and hyponatremia: Secondary | ICD-10-CM | POA: Diagnosis present

## 2020-08-10 DIAGNOSIS — I82220 Acute embolism and thrombosis of inferior vena cava: Secondary | ICD-10-CM | POA: Diagnosis present

## 2020-08-10 DIAGNOSIS — S0003XA Contusion of scalp, initial encounter: Secondary | ICD-10-CM | POA: Diagnosis present

## 2020-08-10 DIAGNOSIS — Z86718 Personal history of other venous thrombosis and embolism: Secondary | ICD-10-CM | POA: Diagnosis not present

## 2020-08-10 DIAGNOSIS — I10 Essential (primary) hypertension: Secondary | ICD-10-CM | POA: Diagnosis present

## 2020-08-10 DIAGNOSIS — D4101 Neoplasm of uncertain behavior of right kidney: Secondary | ICD-10-CM | POA: Diagnosis not present

## 2020-08-10 DIAGNOSIS — D649 Anemia, unspecified: Secondary | ICD-10-CM

## 2020-08-10 DIAGNOSIS — C641 Malignant neoplasm of right kidney, except renal pelvis: Secondary | ICD-10-CM | POA: Diagnosis present

## 2020-08-10 DIAGNOSIS — E785 Hyperlipidemia, unspecified: Secondary | ICD-10-CM | POA: Diagnosis present

## 2020-08-10 DIAGNOSIS — E1169 Type 2 diabetes mellitus with other specified complication: Secondary | ICD-10-CM | POA: Diagnosis present

## 2020-08-10 DIAGNOSIS — F17210 Nicotine dependence, cigarettes, uncomplicated: Secondary | ICD-10-CM | POA: Diagnosis present

## 2020-08-10 DIAGNOSIS — N2889 Other specified disorders of kidney and ureter: Secondary | ICD-10-CM

## 2020-08-10 DIAGNOSIS — K5790 Diverticulosis of intestine, part unspecified, without perforation or abscess without bleeding: Secondary | ICD-10-CM | POA: Diagnosis present

## 2020-08-10 DIAGNOSIS — Z794 Long term (current) use of insulin: Secondary | ICD-10-CM | POA: Diagnosis not present

## 2020-08-10 DIAGNOSIS — Z813 Family history of other psychoactive substance abuse and dependence: Secondary | ICD-10-CM | POA: Diagnosis not present

## 2020-08-10 DIAGNOSIS — E538 Deficiency of other specified B group vitamins: Secondary | ICD-10-CM | POA: Diagnosis present

## 2020-08-10 DIAGNOSIS — W19XXXA Unspecified fall, initial encounter: Secondary | ICD-10-CM | POA: Diagnosis present

## 2020-08-10 DIAGNOSIS — Z825 Family history of asthma and other chronic lower respiratory diseases: Secondary | ICD-10-CM | POA: Diagnosis not present

## 2020-08-10 DIAGNOSIS — Z7984 Long term (current) use of oral hypoglycemic drugs: Secondary | ICD-10-CM | POA: Diagnosis not present

## 2020-08-10 DIAGNOSIS — E119 Type 2 diabetes mellitus without complications: Secondary | ICD-10-CM | POA: Diagnosis present

## 2020-08-10 DIAGNOSIS — D509 Iron deficiency anemia, unspecified: Secondary | ICD-10-CM | POA: Diagnosis present

## 2020-08-10 LAB — ANEMIA PROFILE B
Basophils Absolute: 0 10*3/uL (ref 0.0–0.2)
Basos: 0 %
EOS (ABSOLUTE): 0.1 10*3/uL (ref 0.0–0.4)
Eos: 0 %
Ferritin: 269 ng/mL (ref 30–400)
Folate: 2.4 ng/mL — ABNORMAL LOW (ref >3.0)
Hematocrit: 22.8 % — ABNORMAL LOW (ref 37.5–51.0)
Hemoglobin: 6.6 g/dL — CL (ref 13.0–17.7)
Immature Grans (Abs): 0.1 10*3/uL (ref 0.0–0.1)
Immature Granulocytes: 1 %
Iron Saturation: 8 % — CL (ref 15–55)
Iron: 18 ug/dL — ABNORMAL LOW (ref 38–169)
Lymphocytes Absolute: 4.2 10*3/uL — ABNORMAL HIGH (ref 0.7–3.1)
Lymphs: 35 %
MCH: 21.8 pg — ABNORMAL LOW (ref 26.6–33.0)
MCHC: 28.9 g/dL — ABNORMAL LOW (ref 31.5–35.7)
MCV: 75 fL — ABNORMAL LOW (ref 79–97)
Monocytes Absolute: 0.8 10*3/uL (ref 0.1–0.9)
Monocytes: 6 %
Neutrophils Absolute: 6.8 10*3/uL (ref 1.4–7.0)
Neutrophils: 58 %
Platelets: 561 10*3/uL — ABNORMAL HIGH (ref 150–450)
RBC: 3.03 x10E6/uL — ABNORMAL LOW (ref 4.14–5.80)
RDW: 15.9 % — ABNORMAL HIGH (ref 11.6–15.4)
Retic Ct Pct: 2.1 % (ref 0.6–2.6)
Total Iron Binding Capacity: 229 ug/dL — ABNORMAL LOW (ref 250–450)
UIBC: 211 ug/dL (ref 111–343)
Vitamin B-12: 1109 pg/mL (ref 232–1245)
WBC: 12 10*3/uL — ABNORMAL HIGH (ref 3.4–10.8)

## 2020-08-10 LAB — CMP14+EGFR
ALT: 13 IU/L (ref 0–44)
AST: 7 IU/L (ref 0–40)
Albumin/Globulin Ratio: 0.8 — ABNORMAL LOW (ref 1.2–2.2)
Albumin: 3 g/dL — ABNORMAL LOW (ref 3.8–4.8)
Alkaline Phosphatase: 184 IU/L — ABNORMAL HIGH (ref 44–121)
BUN/Creatinine Ratio: 22 (ref 10–24)
BUN: 13 mg/dL (ref 8–27)
Bilirubin Total: <0.2 mg/dL (ref 0.0–1.2)
CO2: 24 mmol/L (ref 20–29)
Calcium: 8.8 mg/dL (ref 8.6–10.2)
Chloride: 95 mmol/L — ABNORMAL LOW (ref 96–106)
Creatinine, Ser: 0.59 mg/dL — ABNORMAL LOW (ref 0.76–1.27)
Globulin, Total: 3.6 g/dL (ref 1.5–4.5)
Glucose: 60 mg/dL — ABNORMAL LOW (ref 65–99)
Potassium: 4.6 mmol/L (ref 3.5–5.2)
Sodium: 135 mmol/L (ref 134–144)
Total Protein: 6.6 g/dL (ref 6.0–8.5)
eGFR: 105 mL/min/{1.73_m2} (ref >59)

## 2020-08-10 LAB — CBC
HCT: 25.8 % — ABNORMAL LOW (ref 39.0–52.0)
Hemoglobin: 7 g/dL — ABNORMAL LOW (ref 13.0–17.0)
MCH: 22.9 pg — ABNORMAL LOW (ref 26.0–34.0)
MCHC: 27.1 g/dL — ABNORMAL LOW (ref 30.0–36.0)
MCV: 84.3 fL (ref 80.0–100.0)
Platelets: 459 10*3/uL — ABNORMAL HIGH (ref 150–400)
RBC: 3.06 MIL/uL — ABNORMAL LOW (ref 4.22–5.81)
RDW: 17.3 % — ABNORMAL HIGH (ref 11.5–15.5)
WBC: 9.5 10*3/uL (ref 4.0–10.5)
nRBC: 0 % (ref 0.0–0.2)

## 2020-08-10 LAB — BASIC METABOLIC PANEL
Anion gap: 8 (ref 5–15)
BUN: 12 mg/dL (ref 8–23)
CO2: 27 mmol/L (ref 22–32)
Calcium: 8.3 mg/dL — ABNORMAL LOW (ref 8.9–10.3)
Chloride: 97 mmol/L — ABNORMAL LOW (ref 98–111)
Creatinine, Ser: 0.64 mg/dL (ref 0.61–1.24)
GFR, Estimated: >60 mL/min (ref >60)
Glucose, Bld: 79 mg/dL (ref 70–99)
Potassium: 3.9 mmol/L (ref 3.5–5.1)
Sodium: 132 mmol/L — ABNORMAL LOW (ref 135–145)

## 2020-08-10 LAB — LIPID PANEL
Chol/HDL Ratio: 2.8 ratio (ref 0.0–5.0)
Cholesterol, Total: 105 mg/dL (ref 100–199)
HDL: 37 mg/dL — ABNORMAL LOW (ref >39)
LDL Chol Calc (NIH): 54 mg/dL (ref 0–99)
Triglycerides: 67 mg/dL (ref 0–149)
VLDL Cholesterol Cal: 14 mg/dL (ref 5–40)

## 2020-08-10 LAB — PREPARE RBC (CROSSMATCH)

## 2020-08-10 LAB — HEMOGLOBIN AND HEMATOCRIT, BLOOD
HCT: 30.8 % — ABNORMAL LOW (ref 39.0–52.0)
Hemoglobin: 8.7 g/dL — ABNORMAL LOW (ref 13.0–17.0)

## 2020-08-10 LAB — GLUCOSE, CAPILLARY
Glucose-Capillary: 74 mg/dL (ref 70–99)
Glucose-Capillary: 121 mg/dL — ABNORMAL HIGH (ref 70–99)
Glucose-Capillary: 110 mg/dL — ABNORMAL HIGH (ref 70–99)

## 2020-08-10 MED ORDER — SODIUM CHLORIDE 0.9 % IV SOLN: 300.0000 mg | Freq: Once | INTRAVENOUS | Status: DC

## 2020-08-10 MED ORDER — SODIUM CHLORIDE 0.9% IV SOLUTION: Freq: Once | INTRAVENOUS | Status: AC

## 2020-08-10 MED ORDER — SODIUM CHLORIDE 0.9 % IV SOLN
250.0000 mg | Freq: Once | INTRAVENOUS | Status: AC
  Administered 2020-08-10: 250 mg via INTRAVENOUS
  Filled 2020-08-10: qty 20

## 2020-08-10 MED ORDER — INSULIN GLARGINE 100 UNIT/ML ~~LOC~~ SOLN
10.0000 [IU] | Freq: Every day | SUBCUTANEOUS | Status: DC
  Administered 2020-08-10 – 2020-08-11 (×2): 10 [IU] via SUBCUTANEOUS
  Filled 2020-08-10 (×4): qty 0.1

## 2020-08-10 NOTE — Evaluation (Signed)
Occupational Therapy Evaluation Patient Details Name: George Anthony MRN: [id number] DOB: [birth year] Today's Date: 08/10/2020    History of Present Illness George Anthony  is a 70 y.o. male, with past medical history of diabetes mellitus, hypertension, alcohol abuse, anemia, recent admission couple months ago for right lower extremity DVT on Eliquis, patient was sent by his PCP for evaluation for anemia, fall and head trauma, but he felt lightheaded, weak, but he had a fall, and hit his head, so he was told by PCP to come for evaluation, he denies any syncope, chest pain or shortness of breath, report he is compliant with Eliquis.  - in ED CT head/cervical spine with no evidence of bleed or head trauma, hemoglobin low at 6.5 (most recent baseline around 8), he was Hemoccult negative was ordered units PRBC in ED and [facility] hospitalist consulted to admit.   Clinical Impression   Pt agreeable to OT/PT co-evaluation. Pt demonstrates WFL UE strength and functional use. Pt able to ambulate in room without AD with leaning on the wall PRN when transferring to the bathroom. Pt able to ambulate in the hall without assist. Mild balance deficits during functional mobility. Pt appears to be at or near baseline levels for ADL tasks and is not recommended for further OT services. Pt will be discharged to care of nursing staff for the remainder of his stay.     Follow Up Recommendations  No OT follow up    Equipment Recommendations  None recommended by OT           Precautions / Restrictions Precautions Precautions: None      Mobility Bed Mobility Overal bed mobility: Independent                  Transfers Overall transfer level: Independent               General transfer comment: Mild LOB noted at times with pt leaning against the fall PRN in room. Able to ambulate in hall without assist.    Balance Overall balance assessment: Mild deficits observed, not formally tested                                          ADL either performed or assessed with clinical judgement   ADL Overall ADL's : Independent                                             Vision Baseline Vision/History: Wears glasses Wears Glasses: Distance only (PRN) Patient Visual Report: No change from baseline Vision Assessment?: No apparent visual deficits (Per observation during functional tasks.)                Pertinent Vitals/Pain Pain Assessment: 0-10 Pain Score: 0-No pain Pain Location: Reported at times he has pain to touch on the back of his head. Pain Intervention(s): Monitored during session     Hand Dominance Right   Extremity/Trunk Assessment Upper Extremity Assessment Upper Extremity Assessment: Overall WFL for tasks assessed   Lower Extremity Assessment Lower Extremity Assessment: Defer to PT evaluation   Cervical / Trunk Assessment Cervical / Trunk Assessment: Normal   Communication Communication Communication: No difficulties   Cognition Arousal/Alertness: Awake/alert Behavior During Therapy: WFL for tasks assessed/performed Overall Cognitive Status: Within Functional  Limits for tasks assessed                                                      Home Living Family/patient expects to be discharged to:: Private residence Living Arrangements: Spouse/significant other Available Help at Discharge: Family;Available PRN/intermittently Type of Home: House Home Access: Stairs to enter [employer] of Steps: 12 Entrance Stairs-Rails: Right Home Layout: One level     Bathroom Shower/Tub: Teacher, early years/pre: Standard Bathroom Accessibility: Yes How Accessible: Accessible via wheelchair;Accessible via walker Home Equipment: None          Prior Functioning/Environment Level of Independence: Independent        Comments: Pt works.                      OT Goals(Current goals can be  found in the care plan section) Acute Rehab OT Goals Patient Stated Goal: return to work                   Co-evaluation PT/OT/SLP Co-Evaluation/Treatment: Yes Reason for Co-Treatment: To address functional/ADL transfers   OT goals addressed during session: ADL's and self-care      AM-PAC OT "6 Clicks" Daily Activity     Outcome Measure Help from another person eating meals?: None Help from another person taking care of personal grooming?: None Help from another person toileting, which includes using toliet, bedpan, or urinal?: None Help from another person bathing (including washing, rinsing, drying)?: None Help from another person to put on and taking off regular upper body clothing?: None Help from another person to put on and taking off regular lower body clothing?: None 6 Click Score: 24   End of Session    Activity Tolerance: Patient tolerated treatment well Patient left: in bed;with call bell/phone within reach  OT Visit Diagnosis: Unsteadiness on feet (R26.81)                Time: [id number] OT Time Calculation (min): 11 min Charges:  OT General Charges $OT Visit: 1 Visit OT Evaluation $OT Eval Low Complexity: 1 Low  Rozell Kettlewell OT, MOT  Larey Seat 08/10/2020, 9:33 AM

## 2020-08-10 NOTE — Evaluation (Signed)
Physical Therapy Evaluation Patient Details Name: George Anthony MRN: [id number] DOB: 07-Jul-1950 Today's Date: 08/10/2020   History of Present Illness  George Anthony  is a 70 y.o. male, with past medical history of diabetes mellitus, hypertension, alcohol abuse, anemia, recent admission couple months ago for right lower extremity DVT on Eliquis, patient was sent by his PCP for evaluation for anemia, fall and head trauma, but he felt lightheaded, weak, but he had a fall, and hit his head, so he was told by PCP to come for evaluation, he denies any syncope, chest pain or shortness of breath, report he is compliant with Eliquis.  - in ED CT head/cervical spine with no evidence of bleed or head trauma, hemoglobin low at 6.5 (most recent baseline around 8), he was Hemoccult negative was ordered units PRBC in ED and [facility] hospitalist consulted to admit.   Clinical Impression  Patient functioning at baseline for functional mobility and gait.  Plan:  Patient discharged from physical therapy to care of nursing for ambulation daily as tolerated for length of stay.     Follow Up Recommendations No PT follow up;Supervision - Intermittent    Equipment Recommendations  None recommended by PT    Recommendations for Other Services       Precautions / Restrictions Precautions Precautions: None Restrictions Weight Bearing Restrictions: No      Mobility  Bed Mobility Overal bed mobility: Independent                  Transfers Overall transfer level: Modified independent               General transfer comment: slightly labored movement  Ambulation/Gait Ambulation/Gait assistance: Modified independent (Device/Increase time) Gait Distance (Feet): 200 Feet Assistive device: None Gait Pattern/deviations: WFL(Within Functional Limits) Gait velocity: decreased   General Gait Details: grossly WFL with good return for ambulation in room, hallways and on stairs without loss of  balance  Stairs Stairs: Yes Stairs assistance: Modified independent (Device/Increase time) Stair Management: One rail Left;One rail Right;Alternating pattern;Step to pattern Number of Stairs: 10 General stair comments: demonstrates good return for going up/down stairs using 1 siderail without loss of balance  Wheelchair Mobility    Modified Rankin (Stroke Patients Only)       Balance Overall balance assessment: No apparent balance deficits (not formally assessed)                                           Pertinent Vitals/Pain Pain Assessment: No/denies pain    Home Living Family/patient expects to be discharged to:: Private residence Living Arrangements: Spouse/significant other Available Help at Discharge: Family;Available PRN/intermittently Type of Home: House Home Access: Stairs to enter Entrance Stairs-Rails: Right Entrance Stairs-Number of Steps: 12 Home Layout: One level Home Equipment: None      Prior Function Level of Independence: Independent         Comments: Hydrographic surveyor, drives, works     Journalist, newspaper   Dominant Hand: Right    Extremity/Trunk Assessment   Upper Extremity Assessment Upper Extremity Assessment: Defer to OT evaluation    Lower Extremity Assessment Lower Extremity Assessment: Overall WFL for tasks assessed    Cervical / Trunk Assessment Cervical / Trunk Assessment: Normal  Communication   Communication: No difficulties  Cognition Arousal/Alertness: Awake/alert Behavior During Therapy: WFL for tasks assessed/performed Overall Cognitive Status: Within Functional Limits for tasks  assessed                                        General Comments      Exercises     Assessment/Plan    PT Assessment Patent does not need any further PT services  PT Problem List         PT Treatment Interventions      PT Goals (Current goals can be found in the Care Plan section)  Acute Rehab  PT Goals Patient Stated Goal: return home and resume working PT Goal Formulation: With patient Time For Goal Achievement: 08/10/20 Potential to Achieve Goals: Good    Frequency     Barriers to discharge        Co-evaluation PT/OT/SLP Co-Evaluation/Treatment: Yes Reason for Co-Treatment: Complexity of the patient's impairments (multi-system involvement);To address functional/ADL transfers PT goals addressed during session: Mobility/safety with mobility;Balance         AM-PAC PT "6 Clicks" Mobility  Outcome Measure Help needed turning from your back to your side while in a flat bed without using bedrails?: None Help needed moving from lying on your back to sitting on the side of a flat bed without using bedrails?: None Help needed moving to and from a bed to a chair (including a wheelchair)?: None Help needed standing up from a chair using your arms (e.g., wheelchair or bedside chair)?: None Help needed to walk in hospital room?: None Help needed climbing 3-5 steps with a railing? : None 6 Click Score: 24    End of Session   Activity Tolerance: Patient tolerated treatment well Patient left: in bed;with call bell/phone within reach Nurse Communication: Mobility status PT Visit Diagnosis: Unsteadiness on feet (R26.81);Other abnormalities of gait and mobility (R26.89);Muscle weakness (generalized) (M62.81)    Time: [id number] PT Time Calculation (min) (ACUTE ONLY): 14 min   Charges:   PT Evaluation $PT Eval Low Complexity: 1 Low PT Treatments $Gait Training: 8-22 mins        2:40 PM, 08/10/20 Lonell Grandchild, MPT Physical Therapist with [facility] [phone] office [phone] mobile phone

## 2020-08-10 NOTE — Consult Note (Signed)
Urology Consult  Referring physician: Dr. Roger Shelter Reason for referral: right renal mass  Chief Complaint: fatigue  History of Present Illness: George Anthony is a 70yo with a history of HTN, HLD, and DMII who was admitted with symptomatic anemia. Of note he was admitted in 05/2020 for new onset DVT and started on eliquis. After discharge in 05/2020 he continued to feel fatigued and lightheaded. He has a 25lb weight loss in the past 2 months. He has been having chills and night sweats for several months. He denies any LUTS and no gross hematuria. He underwent CT Chest/abd/pelvis yesterday which showed a 9cm right renal mass with tumor thrombus extending above hepatic vessels. Hemoglobin currently in 7.0. Alk Phos 184. Albumin 3.0.   Past Medical History:  Diagnosis Date   Diabetes mellitus without complication ([facility])    Enlarged heart    Hyperlipidemia    Hypertension    Past Surgical History:  Procedure Laterality Date   BIOPSY  09/16/2019   Procedure: BIOPSY;  Surgeon: Eloise Harman, DO;  [facility];  Service: Endoscopy;;   COLONOSCOPY WITH PROPOFOL N/A 09/16/2019   Procedure: COLONOSCOPY WITH PROPOFOL;  Surgeon: Eloise Harman, DO;  [facility];  Service: Endoscopy;  Laterality: N/A;  7:30am   POLYPECTOMY  09/16/2019   Procedure: POLYPECTOMY;  Surgeon: Eloise Harman, DO;  [facility];  Service: Endoscopy;;    Medications: I have reviewed the patient's current medications. Allergies: No Known Allergies  Family History  Problem Relation Age of Onset   Emphysema Mother    Drug abuse Sister    Colon cancer Neg Hx    Social History:  reports that he has been smoking cigarettes. He has a 40.00 pack-year smoking history. He has never used smokeless tobacco. He reports current alcohol use of about 3.0 standard drinks of alcohol per week. He reports that he does not use drugs.  Review of Systems  Constitutional:  Positive for chills and fatigue.   Cardiovascular:  Positive for leg swelling.  All other systems reviewed and are negative.  Physical Exam:  Vital signs in last 24 hours: Temp:  [98 F (36.7 C)-98.6 F (37 C)] 98.6 F (37 C) (07/22 1226) Pulse Rate:  [44-99] 74 (07/22 1226) Resp:  [15-27] 17 (07/22 1226) BP: (100-144)/(60-96) 112/62 (07/22 1226) SpO2:  [90 %-99 %] 96 % (07/22 1226) Weight:  [79.1 kg] 79.1 kg (07/22 0526) Physical Exam Vitals reviewed.  Constitutional:      Appearance: Normal appearance.  HENT:     Head: Normocephalic and atraumatic.     Nose: Nose normal. No congestion.  Eyes:     Extraocular Movements: Extraocular movements intact.     Pupils: Pupils are equal, round, and reactive to light.  Cardiovascular:     Rate and Rhythm: Normal rate and regular rhythm.  Pulmonary:     Effort: Pulmonary effort is normal. No respiratory distress.  Abdominal:     General: Bowel sounds are normal.     Palpations: Abdomen is soft.  Musculoskeletal:        General: Swelling present. Normal range of motion.     Cervical back: Normal range of motion and neck supple.  Skin:    General: Skin is warm and dry.  Neurological:     General: No focal deficit present.     Mental Status: He is alert and oriented to person, place, and time.  Psychiatric:        Mood and  Affect: Mood normal.        Behavior: Behavior normal.        Thought Content: Thought content normal.        Judgment: Judgment normal.    Laboratory Data:  Results for orders placed or performed during the hospital encounter of 08/09/20 (from the past 72 hour(s))  Basic metabolic panel     Status: Abnormal   Collection Time: 08/09/20 11:30 AM  Result Value Ref Range   Sodium 132 (L) 135 - 145 mmol/L   Potassium 3.8 3.5 - 5.1 mmol/L   Chloride 95 (L) 98 - 111 mmol/L   CO2 28 22 - 32 mmol/L   Glucose, Bld 104 (H) 70 - 99 mg/dL    Comment: Glucose reference range applies only to samples taken after fasting for at least 8 hours.   BUN 14  8 - 23 mg/dL   Creatinine, Ser 0.77 0.61 - 1.24 mg/dL   Calcium 8.3 (L) 8.9 - 10.3 mg/dL   GFR, Estimated >60 >60 mL/min    Comment: (NOTE) Calculated using the CKD-EPI Creatinine Equation (2021)    Anion gap 9 5 - 15    Comment: Performed at [facility], [address], [place], [place] [address]  CBC with Differential     Status: Abnormal   Collection Time: 08/09/20 11:30 AM  Result Value Ref Range   WBC 12.3 (H) 4.0 - 10.5 K/uL   RBC 2.88 (L) 4.22 - 5.81 MIL/uL   Hemoglobin 6.5 (LL) 13.0 - 17.0 g/dL    Comment: This critical result has verified and been called to TRAVIS DEVOULT,RN by Deanne Coffer on 07 21 2022 at 1353, and has been read back.    HCT 23.8 (L) 39.0 - 52.0 %   MCV 82.6 80.0 - 100.0 fL   MCH 22.6 (L) 26.0 - 34.0 pg   MCHC 27.3 (L) 30.0 - 36.0 g/dL   RDW 18.0 (H) 11.5 - 15.5 %   Platelets 546 (H) 150 - 400 K/uL   nRBC 0.0 0.0 - 0.2 %   Neutrophils Relative % 59 %   Neutro Abs 7.3 1.7 - 7.7 K/uL   Lymphocytes Relative 34 %   Lymphs Abs 4.2 (H) 0.7 - 4.0 K/uL   Monocytes Relative 6 %   Monocytes Absolute 0.7 0.1 - 1.0 K/uL   Eosinophils Relative 0 %   Eosinophils Absolute 0.1 0.0 - 0.5 K/uL   Basophils Relative 0 %   Basophils Absolute 0.0 0.0 - 0.1 K/uL   Immature Granulocytes 1 %   Abs Immature Granulocytes 0.08 (H) 0.00 - 0.07 K/uL    Comment: Performed at [facility], [address] [place], [place] [address]  Type and screen     Status: None (Preliminary result)   Collection Time: 08/09/20  1:48 PM  Result Value Ref Range   ABO/RH(D) O POS    Antibody Screen NEG    Sample Expiration 08/12/2020,2359    Unit Number [id number]    Blood Component Type RED CELLS,LR    Unit division 00    Status of Unit ISSUED,FINAL    Transfusion Status OK TO TRANSFUSE    Crossmatch Result      Compatible Performed at [facility], [address] [place], [place] [address]    Unit Number [id number]    Blood Component Type RED CELLS,LR    Unit division 00     Status of Unit ISSUED,FINAL    Transfusion Status OK TO TRANSFUSE  Crossmatch Result Compatible    Unit Number [id number]    Blood Component Type RED CELLS,LR    Unit division 00    Status of Unit ALLOCATED    Transfusion Status OK TO TRANSFUSE    Crossmatch Result Compatible   Prepare RBC (crossmatch)     Status: None   Collection Time: 08/09/20  1:48 PM  Result Value Ref Range   Order Confirmation      ORDER PROCESSED BY BLOOD BANK Performed at [facility], [address] [place], [place] [address]   Prepare RBC (crossmatch)     Status: None   Collection Time: 08/09/20  1:48 PM  Result Value Ref Range   Order Confirmation      ORDER PROCESSED BY BLOOD BANK Performed at [facility], [address], [place], [place] [address]   Resp Panel by RT-PCR (Flu A&B, Covid) Nasopharyngeal Swab     Status: None   Collection Time: 08/09/20  1:49 PM   Specimen: Nasopharyngeal Swab; Nasopharyngeal(NP) swabs in vial transport medium  Result Value Ref Range   SARS Coronavirus 2 by RT PCR NEGATIVE NEGATIVE    Comment: (NOTE) SARS-CoV-2 target nucleic acids are NOT DETECTED.  The SARS-CoV-2 RNA is generally detectable in upper respiratory specimens during the acute phase of infection. The lowest concentration of SARS-CoV-2 viral copies this assay can detect is 138 copies/mL. A negative result does not preclude SARS-Cov-2 infection and should not be used as the sole basis for treatment or other patient management decisions. A negative result may occur with  improper specimen collection/handling, submission of specimen other than nasopharyngeal swab, presence of viral mutation(s) within the areas targeted by this assay, and inadequate number of viral copies(<138 copies/mL). A negative result must be combined with clinical observations, patient history, and epidemiological information. The expected result is Negative.  Fact Sheet for Patients:   EntrepreneurPulse.com.au  Fact Sheet for Healthcare Providers:  IncredibleEmployment.be  This test is no t yet approved or cleared by the [place] FDA and  has been authorized for detection and/or diagnosis of SARS-CoV-2 by FDA under an Emergency Use Authorization (EUA). This EUA will remain  in effect (meaning this test can be used) for the duration of the COVID-19 declaration under Section 564(b)(1) of the Act, 21 U.S.C.section 360bbb-3(b)(1), unless the authorization is terminated  or revoked sooner.       Influenza A by PCR NEGATIVE NEGATIVE   Influenza B by PCR NEGATIVE NEGATIVE    Comment: (NOTE) The Xpert Xpress SARS-CoV-2/FLU/RSV plus assay is intended as an aid in the diagnosis of influenza from Nasopharyngeal swab specimens and should not be used as a sole basis for treatment. Nasal washings and aspirates are unacceptable for Xpert Xpress SARS-CoV-2/FLU/RSV testing.  Fact Sheet for Patients: EntrepreneurPulse.com.au  Fact Sheet for Healthcare Providers: IncredibleEmployment.be  This test is not yet approved or cleared by the [place] FDA and has been authorized for detection and/or diagnosis of SARS-CoV-2 by FDA under an Emergency Use Authorization (EUA). This EUA will remain in effect (meaning this test can be used) for the duration of the COVID-19 declaration under Section 564(b)(1) of the Act, 21 U.S.C. section 360bbb-3(b)(1), unless the authorization is terminated or revoked.  Performed at [facility], [address], [place], [place] [address]   POC occult blood, ED RN will collect     Status: None   Collection Time: 08/09/20  6:08 PM  Result Value Ref Range   Fecal Occult Bld NEGATIVE NEGATIVE  CBG monitoring,  ED     Status: None   Collection Time: 08/09/20 10:23 PM  Result Value Ref Range   Glucose-Capillary 87 70 - 99 mg/dL    Comment: Glucose reference range applies  only to samples taken after fasting for at least 8 hours.  Basic metabolic panel     Status: Abnormal   Collection Time: 08/10/20  4:42 AM  Result Value Ref Range   Sodium 132 (L) 135 - 145 mmol/L   Potassium 3.9 3.5 - 5.1 mmol/L   Chloride 97 (L) 98 - 111 mmol/L   CO2 27 22 - 32 mmol/L   Glucose, Bld 79 70 - 99 mg/dL    Comment: Glucose reference range applies only to samples taken after fasting for at least 8 hours.   BUN 12 8 - 23 mg/dL   Creatinine, Ser 0.64 0.61 - 1.24 mg/dL   Calcium 8.3 (L) 8.9 - 10.3 mg/dL   GFR, Estimated >60 >60 mL/min    Comment: (NOTE) Calculated using the CKD-EPI Creatinine Equation (2021)    Anion gap 8 5 - 15    Comment: Performed at [facility], [address], [place], [place] [address]  CBC     Status: Abnormal   Collection Time: 08/10/20  4:42 AM  Result Value Ref Range   WBC 9.5 4.0 - 10.5 K/uL   RBC 3.06 (L) 4.22 - 5.81 MIL/uL   Hemoglobin 7.0 (L) 13.0 - 17.0 g/dL   HCT 25.8 (L) 39.0 - 52.0 %   MCV 84.3 80.0 - 100.0 fL   MCH 22.9 (L) 26.0 - 34.0 pg   MCHC 27.1 (L) 30.0 - 36.0 g/dL   RDW 17.3 (H) 11.5 - 15.5 %   Platelets 459 (H) 150 - 400 K/uL   nRBC 0.0 0.0 - 0.2 %    Comment: Performed at [facility], [address], [place], [place] [address]  Glucose, capillary     Status: None   Collection Time: 08/10/20  7:36 AM  Result Value Ref Range   Glucose-Capillary 74 70 - 99 mg/dL    Comment: Glucose reference range applies only to samples taken after fasting for at least 8 hours.  Glucose, capillary     Status: Abnormal   Collection Time: 08/10/20 11:26 AM  Result Value Ref Range   Glucose-Capillary 121 (H) 70 - 99 mg/dL    Comment: Glucose reference range applies only to samples taken after fasting for at least 8 hours.   Recent Results (from the past 240 hour(s))  Resp Panel by RT-PCR (Flu A&B, Covid) Nasopharyngeal Swab     Status: None   Collection Time: 08/09/20  1:49 PM   Specimen: Nasopharyngeal Swab; Nasopharyngeal(NP) swabs  in vial transport medium  Result Value Ref Range Status   SARS Coronavirus 2 by RT PCR NEGATIVE NEGATIVE Final    Comment: (NOTE) SARS-CoV-2 target nucleic acids are NOT DETECTED.  The SARS-CoV-2 RNA is generally detectable in upper respiratory specimens during the acute phase of infection. The lowest concentration of SARS-CoV-2 viral copies this assay can detect is 138 copies/mL. A negative result does not preclude SARS-Cov-2 infection and should not be used as the sole basis for treatment or other patient management decisions. A negative result may occur with  improper specimen collection/handling, submission of specimen other than nasopharyngeal swab, presence of viral mutation(s) within the areas targeted by this assay, and inadequate number of viral copies(<138 copies/mL). A negative result must be combined with clinical observations, patient history, and epidemiological information.  The expected result is Negative.  Fact Sheet for Patients:  EntrepreneurPulse.com.au  Fact Sheet for Healthcare Providers:  IncredibleEmployment.be  This test is no t yet approved or cleared by the [place] FDA and  has been authorized for detection and/or diagnosis of SARS-CoV-2 by FDA under an Emergency Use Authorization (EUA). This EUA will remain  in effect (meaning this test can be used) for the duration of the COVID-19 declaration under Section 564(b)(1) of the Act, 21 U.S.C.section 360bbb-3(b)(1), unless the authorization is terminated  or revoked sooner.       Influenza A by PCR NEGATIVE NEGATIVE Final   Influenza B by PCR NEGATIVE NEGATIVE Final    Comment: (NOTE) The Xpert Xpress SARS-CoV-2/FLU/RSV plus assay is intended as an aid in the diagnosis of influenza from Nasopharyngeal swab specimens and should not be used as a sole basis for treatment. Nasal washings and aspirates are unacceptable for Xpert Xpress  SARS-CoV-2/FLU/RSV testing.  Fact Sheet for Patients: EntrepreneurPulse.com.au  Fact Sheet for Healthcare Providers: IncredibleEmployment.be  This test is not yet approved or cleared by the [place] FDA and has been authorized for detection and/or diagnosis of SARS-CoV-2 by FDA under an Emergency Use Authorization (EUA). This EUA will remain in effect (meaning this test can be used) for the duration of the COVID-19 declaration under Section 564(b)(1) of the Act, 21 U.S.C. section 360bbb-3(b)(1), unless the authorization is terminated or revoked.  Performed at [facility], [address], [place], [place] [address]    Creatinine: Recent Labs    08/09/20 6226 08/09/20 1130 08/10/20 0442  CREATININE 0.59* 0.77 0.64   Baseline Creatinine: 0.6  Impression/Assessment:  70yo with right renal mass with tumor thrombus, Level III  Plan:  We discussed the natural hx of renal masses and the management of masses with concurrent IVC thrombus. Given the complexity of the tumor thrombus, the patient will need treatment at a tertiary care facility with Urology, Vascular, and CT Surgery familiar with removing complex renal masses.  George Anthony 08/10/2020, 12:45 PM

## 2020-08-10 NOTE — Progress Notes (Addendum)
  Requested by urologist [name] for patient to be transferred to tertiary care  Mainly [facility] versus [facility] hospital Due to new finding of extensive right renal mass with IVC thrombus Patient likely will need urology, vascular and CT surgery to remove the complex right renal masses.   All information including images were forwarded to both facilities; [facility] finally has called back to let us know regarding no bed availability.  Got a call back from [facility]: Spoke to hospitalist Dr. Janese Banks excepted the patient, but no bed available at this time   Pending bed availability at [facility].  Bed becomes available at [facility] within next 24 hours patient will be transferred.  Dr. Roger Shelter

## 2020-08-10 NOTE — Progress Notes (Addendum)
PROGRESS NOTE    Patient: George Anthony                            PCP: Loman Brooklyn, FNP                    DOB: [birth year]            DOA: 08/09/2020 [id number]             DOS: 08/10/2020, 11:22 AM   LOS: 0 days   Date of Service: The patient was seen and examined on 08/10/2020  Subjective:   The patient was seen and examined this morning. Stable at this time. Complain of generalized weaknesses otherwise stable Otherwise no issues overnight .  Brief Narrative:  George Anthony  is a 70 y.o. male, with past medical history of diabetes mellitus, hypertension, alcohol abuse, anemia, recent admission couple months ago for right lower extremity DVT on Eliquis, patient was sent by his PCP for evaluation for anemia, fall and head trauma, but he felt lightheaded, weak, but he had a fall, and hit his head, so he was told by PCP to come for evaluation, he denies any syncope, chest pain or shortness of breath, report he is compliant with Eliquis. - in ED CT head/cervical spine with no evidence of bleed or head trauma, hemoglobin low at 6.5 (most recent baseline around 8), he was Hemoccult negative was ordered units PRBC in ED  Was further admitted for RBC transfusion, IV iron, -Subsequently due to complaint of unintentional weight loss CT chest, abdomen, pelvis, revealed right renal mass. -Urologist was consulted  Assessment & Plan:   Principal Problem:   Symptomatic anemia Active Problems:   Right renal mass   Cigarette nicotine dependence without complication   DM type 2 with diabetic dyslipidemia (HCC)   Hyperlipidemia associated with type 2 diabetes mellitus ([place])   Hypertension associated with type 2 diabetes mellitus (HCC)   Anemia   Acute DVT (deep venous thrombosis) (HCC)     Symptomatic anemia (iron/folate deficiency) -This is patient's second admission over last few months for anemia, reports he is compliant with his iron, he is Hemoccult negative, colonoscopy last year  significant only for polyps/diverticulosis. -Symptomatic presents with falls, generalized weakness and lightheadedness,  -Hemoglobin 6.5 >> 7.0 this morning (after 1u PRBC) -Status post 1 unit PRBC transfusion still symptomatic, another unit will be given today  -Work-up during recent previous hospitalization significant for folate deficiency and iron deficiency anemia,  -Continue p.o. folate -We will continue p.o. iron, will transfuse 300 mg today   -Status post recent colonoscopy with no past 2 years, may need further evaluation if polyps were found Per patient all benign- hey are aware of this and they will follow with [name] as an outpatient after discharge.    Right renal mass -new findings -Due to unintentional weight loss, CT chest, abdomen, pelvis was completed -reviewed in detail Finding consistent with large right renal mass likely renal cell carcinoma -Patient has a long history of tobacco abuse, recent weight loss -Case was discussed with urologist [name] .Marland KitchenMarland Kitchen Appreciate follow-up and recommendations    Lower extremity DVT -Continue with home dose Eliquis -Hemoccult negative   Recurrent falls -With head trauma, CT head/cervical spine with no bleed, patient with significant posterior scalp hematoma, will start on Keflex. - will cosnult PT/OT   Diabetes mellitus, type II, insulin-dependent -Resume Lantus at a lower dose 15>10  units, will add insulin sliding scale, hold metformin as he will receive IV contrast. -Checking CBG QA CHS, SSI coverage   Hyperlipidemia -Continue with atorvastatin   Hypertension -Stable resuming meds with exception of losartan     Code Status full   Antimicrobials: None    Consultants: Urologist [name]   ------------------------------------------------------------------------------------------------------------------------------------------------  DVT prophylaxis: Eliquis Code Status:   Code Status: Full  Code  Family Communication: No family member present at bedside- attempt will be made to update daily The above findings and plan of care has been discussed with patient (and family)  in detail,  they expressed understanding and agreement of above. -Advance care planning has been discussed.   Admission status:   Status is: Observation  The patient remains OBS appropriate and will d/c before 2 midnights.  Dispo: The patient is from: Home              Anticipated d/c is to: Home              Patient currently is not medically stable to d/c.   Difficult to place patient No      Level of care: Med-Surg   Procedures:   No admission procedures for hospital encounter.    Antimicrobials:  Anti-infectives (From admission, onward)    Start     Dose/Rate Route Frequency Ordered Stop   08/09/20 2200  cephALEXin (KEFLEX) capsule 500 mg        500 mg Oral Every 8 hours 08/09/20 1904          Medication:   sodium chloride   Intravenous Once   apixaban  5 mg Oral BID   atorvastatin  40 mg Oral QHS   cephALEXin  500 mg Oral [id number]   folic acid  1 mg Oral Daily   insulin aspart  0-5 Units Subcutaneous QHS   insulin aspart  0-9 Units Subcutaneous TID WC   insulin glargine  10 Units Subcutaneous Daily    HYDROcodone-acetaminophen   Objective:   Vitals:   08/10/20 0045 08/10/20 0126 08/10/20 0333 08/10/20 0526  BP: 130/80 128/90 100/60   Pulse: 75 76 (!) 52   Resp: '19 20 20   '$ Temp: 98 F (36.7 C) 98.4 F (36.9 C) 98.2 F (36.8 C)   TempSrc: Oral Oral    SpO2: 94% 97% 98%   Weight:    79.1 kg  Height:    '5\' 7"'$  (1.702 m)    Intake/Output Summary (Last 24 hours) at 08/10/2020 1122 Last data filed at 08/10/2020 0900 Gross per 24 hour  Intake 1978.13 ml  Output --  Net 1978.13 ml   Filed Weights   08/10/20 0526  Weight: 79.1 kg     Examination:   Physical Exam  Constitution:  Alert, cooperative, no distress,  Appears calm and comfortable  Psychiatric: Normal  and stable mood and affect, cognition intact,   HEENT: Normocephalic, PERRL, otherwise with in Normal limits  Chest:Chest symmetric Cardio vascular:  S1/S2, RRR, No murmure, No Rubs or Gallops  pulmonary: Clear to auscultation bilaterally, respirations unlabored, negative wheezes / crackles Abdomen: Soft, non-tender, non-distended, bowel sounds,no masses, no organomegaly Muscular skeletal: Generalized weaknesses Limited exam - in bed, able to move all 4 extremities, Normal strength,  Neuro: CNII-XII intact. , normal motor and sensation, reflexes intact  Extremities: No pitting edema lower extremities, +2 pulses  Skin: Dry, warm to touch, negative for any Rashes, No open wounds Wounds: per nursing documentation    ------------------------------------------------------------------------------------------------------------------------------------------  LABs:  CBC Latest Ref Rng & Units 08/10/2020 08/09/2020 08/09/2020  WBC 4.0 - 10.5 K/uL 9.5 12.3(H) 12.0(H)  Hemoglobin 13.0 - 17.0 g/dL 7.0(L) 6.5(LL) 6.6(LL)  Hematocrit 39.0 - 52.0 % 25.8(L) 23.8(L) 22.8(L)  Platelets 150 - 400 K/uL 459(H) 546(H) 561(H)   CMP Latest Ref Rng & Units 08/10/2020 08/09/2020 08/09/2020  Glucose 70 - 99 mg/dL 79 104(H) 60(L)  BUN 8 - 23 mg/dL '12 14 13  '$ Creatinine 0.61 - 1.24 mg/dL 0.64 0.77 0.59(L)  Sodium 135 - 145 mmol/L 132(L) 132(L) 135  Potassium 3.5 - 5.1 mmol/L 3.9 3.8 4.6  Chloride 98 - 111 mmol/L 97(L) 95(L) 95(L)  CO2 22 - 32 mmol/L '27 28 24  '$ Calcium 8.9 - 10.3 mg/dL 8.3(L) 8.3(L) 8.8  Total Protein 6.0 - 8.5 g/dL - - 6.6  Total Bilirubin 0.0 - 1.2 mg/dL - - <0.2  Alkaline Phos 44 - 121 IU/L - - 184(H)  AST 0 - 40 IU/L - - 7  ALT 0 - 44 IU/L - - 13       Micro Results Recent Results (from the past 240 hour(s))  Resp Panel by RT-PCR (Flu A&B, Covid) Nasopharyngeal Swab     Status: None   Collection Time: 08/09/20  1:49 PM   Specimen: Nasopharyngeal Swab; Nasopharyngeal(NP) swabs in vial  transport medium  Result Value Ref Range Status   SARS Coronavirus 2 by RT PCR NEGATIVE NEGATIVE Final    Comment: (NOTE) SARS-CoV-2 target nucleic acids are NOT DETECTED.  The SARS-CoV-2 RNA is generally detectable in upper respiratory specimens during the acute phase of infection. The lowest concentration of SARS-CoV-2 viral copies this assay can detect is 138 copies/mL. A negative result does not preclude SARS-Cov-2 infection and should not be used as the sole basis for treatment or other patient management decisions. A negative result may occur with  improper specimen collection/handling, submission of specimen other than nasopharyngeal swab, presence of viral mutation(s) within the areas targeted by this assay, and inadequate number of viral copies(<138 copies/mL). A negative result must be combined with clinical observations, patient history, and epidemiological information. The expected result is Negative.  Fact Sheet for Patients:  EntrepreneurPulse.com.au  Fact Sheet for Healthcare Providers:  IncredibleEmployment.be  This test is no t yet approved or cleared by the [place] FDA and  has been authorized for detection and/or diagnosis of SARS-CoV-2 by FDA under an Emergency Use Authorization (EUA). This EUA will remain  in effect (meaning this test can be used) for the duration of the COVID-19 declaration under Section 564(b)(1) of the Act, 21 U.S.C.section 360bbb-3(b)(1), unless the authorization is terminated  or revoked sooner.       Influenza A by PCR NEGATIVE NEGATIVE Final   Influenza B by PCR NEGATIVE NEGATIVE Final    Comment: (NOTE) The Xpert Xpress SARS-CoV-2/FLU/RSV plus assay is intended as an aid in the diagnosis of influenza from Nasopharyngeal swab specimens and should not be used as a sole basis for treatment. Nasal washings and aspirates are unacceptable for Xpert Xpress SARS-CoV-2/FLU/RSV testing.  Fact  Sheet for Patients: EntrepreneurPulse.com.au  Fact Sheet for Healthcare Providers: IncredibleEmployment.be  This test is not yet approved or cleared by the [place] FDA and has been authorized for detection and/or diagnosis of SARS-CoV-2 by FDA under an Emergency Use Authorization (EUA). This EUA will remain in effect (meaning this test can be used) for the duration of the COVID-19 declaration under Section 564(b)(1) of the Act, 21 U.S.C. section 360bbb-3(b)(1), unless the  authorization is terminated or revoked.  Performed at [facility], [address], [place], [place] [address]     Radiology Reports CT Head Wo Contrast  Result Date: 08/09/2020 CLINICAL DATA:  Head trauma, mod-severe fall EXAM: CT HEAD WITHOUT CONTRAST TECHNIQUE: Contiguous axial images were obtained from the base of the skull through the vertex without intravenous contrast. COMPARISON:  None. FINDINGS: Brain: No evidence of acute infarction, hemorrhage, hydrocephalus, extra-axial collection or mass lesion/mass effect.The ventricles are normal in size. Vascular: No hyperdense vessel or unexpected calcification. Skull: Normal. Negative for fracture or focal lesion. Sinuses/Orbits: Scattered paranasal sinus mucosal thickening. Other: There is a moderate size left posterior scalp hematoma. IMPRESSION: No acute intracranial abnormality. Electronically Signed   By: Maurine Simmering   On: 08/09/2020 17:23   CT Cervical Spine Wo Contrast  Result Date: 08/09/2020 CLINICAL DATA:  neck pain fall.fall 2 days ago. Patient has knot on back of head. Denies LOC. Scan delayed, RN needed to start blood prior to coming to CT. EXAM: CT CERVICAL SPINE WITHOUT CONTRAST TECHNIQUE: Multidetector CT imaging of the cervical spine was performed without intravenous contrast. Multiplanar CT image reconstructions were also generated. COMPARISON:  None. FINDINGS: Alignment: Normal. Skull base and vertebrae: Multilevel  mild degenerative changes of the spine. No acute fracture. No aggressive appearing focal osseous lesion or focal pathologic process. Soft tissues and spinal canal: No prevertebral fluid or swelling. No visible canal hematoma. Upper chest: Unremarkable. Other: Left apical cystic changes. IMPRESSION: No acute displaced fracture or traumatic listhesis of the cervical spine. Electronically Signed   By: Iven Finn M.D.   On: 08/09/2020 17:48   CT CHEST ABDOMEN PELVIS W CONTRAST  Result Date: 08/09/2020 CLINICAL DATA:  Unintended weight loss, DVT, alcohol abuse EXAM: CT CHEST, ABDOMEN, AND PELVIS WITH CONTRAST TECHNIQUE: Multidetector CT imaging of the chest, abdomen and pelvis was performed following the standard protocol during bolus administration of intravenous contrast. CONTRAST:  138m OMNIPAQUE IOHEXOL 300 MG/ML  SOLN COMPARISON:  06/01/2020 FINDINGS: CT CHEST FINDINGS Cardiovascular: The heart is not enlarged. Small pericardial effusion is identified. No evidence of thoracic aortic aneurysm or dissection. Moderate atherosclerosis of the aorta and coronary vasculature. Mediastinum/Nodes: No enlarged mediastinal, hilar, or axillary lymph nodes. Thyroid gland, trachea, and esophagus demonstrate no significant findings. Lungs/Pleura: No airspace disease, effusion, or pneumothorax. Central airways are widely patent. Musculoskeletal: Interval development of a T5 compression deformity, with approximately 50% loss of height. No retropulsion. Reconstructed images demonstrate no additional findings. CT ABDOMEN PELVIS FINDINGS Hepatobiliary: Punctate calcified gallstone identified without cholecystitis. The liver is grossly unremarkable. Pancreas: Unremarkable. No pancreatic ductal dilatation or surrounding inflammatory changes. Spleen: Normal in size without focal abnormality. Adrenals/Urinary Tract: There is a large heterogeneous mass involving the upper aspect of the right kidney, measuring 9.5 x 9.1 x 9.0 cm.  This mass extends into the right renal pelvis and invades the right renal vein, with tumor thrombus identified into the inferior vena cava extending superiorly to the level of the hepatic veins. The left kidney is grossly unremarkable. Left adrenal thickening measures up to 15 mm, nonspecific. The right adrenals unremarkable. The bladder is decompressed, limiting its evaluation. Stomach/Bowel: No bowel obstruction or ileus. No bowel wall thickening or inflammatory change. Vascular/Lymphatic: Tumor thrombus within the right renal vein and inferior vena cava as above. Numerous venous collaterals are seen within the right hemiabdomen. Moderate atherosclerosis of the aorta and its distal branches. Retroperitoneal lymphadenopathy is identified, measuring up to 11 mm in the aortocaval region, consistent with  metastatic disease. Reproductive: Prostate is unremarkable. Other: No free fluid or free gas.  No abdominal wall hernia. Musculoskeletal: No acute or destructive bony lesions. Reconstructed images demonstrate no additional findings. IMPRESSION: 1. Large right renal mass consistent with renal cell carcinoma. There is extension into the right renal pelvis, with tumor thrombus extending into the right renal vein and inferior vena cava. 2. Retroperitoneal lymphadenopathy consistent with nodal metastases. 3. Interval development of a T5 compression deformity, new since 06/01/2020 but otherwise age indeterminate. Pathologic fracture cannot be excluded in light of the right renal findings. 4. Small pericardial effusion. 5. Cholelithiasis without cholecystitis. 6. Nonspecific left adrenal thickening measuring up to 15 mm. 7.  Aortic Atherosclerosis (ICD10-I70.0). Electronically Signed   By: Randa Ngo M.D.   On: 08/09/2020 23:29    SIGNED: Deatra James, MD, FHM. [facility],  Pager (please use [email or web address] to page/text) Please use Epic Secure Chat for non-urgent communication (7AM-7PM)  If 7PM-7AM, please  contact night-coverage [email or web address], 08/10/2020, 11:22 AM

## 2020-08-11 DIAGNOSIS — D649 Anemia, unspecified: Secondary | ICD-10-CM | POA: Diagnosis not present

## 2020-08-11 LAB — BPAM RBC
Blood Product Expiration Date: 202208292359
Blood Product Expiration Date: 202208292359
Blood Product Expiration Date: 202208292359
ISSUE DATE / TIME: 202207211506
ISSUE DATE / TIME: 202207211841
ISSUE DATE / TIME: 202207221405
Unit Type and Rh: 5100
Unit Type and Rh: 5100
Unit Type and Rh: 5100

## 2020-08-11 LAB — CBC
HCT: 29.6 % — ABNORMAL LOW (ref 39.0–52.0)
Hemoglobin: 8.5 g/dL — ABNORMAL LOW (ref 13.0–17.0)
MCH: 23.9 pg — ABNORMAL LOW (ref 26.0–34.0)
MCHC: 28.7 g/dL — ABNORMAL LOW (ref 30.0–36.0)
MCV: 83.1 fL (ref 80.0–100.0)
Platelets: 416 10*3/uL — ABNORMAL HIGH (ref 150–400)
RBC: 3.56 MIL/uL — ABNORMAL LOW (ref 4.22–5.81)
RDW: 17.5 % — ABNORMAL HIGH (ref 11.5–15.5)
WBC: 9.7 10*3/uL (ref 4.0–10.5)
nRBC: 0 % (ref 0.0–0.2)

## 2020-08-11 LAB — TYPE AND SCREEN
ABO/RH(D): O POS
Antibody Screen: NEGATIVE
Unit division: 0
Unit division: 0
Unit division: 0

## 2020-08-11 LAB — GLUCOSE, CAPILLARY
Glucose-Capillary: 119 mg/dL — ABNORMAL HIGH (ref 70–99)
Glucose-Capillary: 135 mg/dL — ABNORMAL HIGH (ref 70–99)

## 2020-08-11 LAB — AFP TUMOR MARKER: AFP, Serum, Tumor Marker: 1.5 ng/mL (ref 0.0–8.4)

## 2020-08-11 LAB — CEA: CEA: 3.5 ng/mL (ref 0.0–4.7)

## 2020-08-11 MED ORDER — FOLIC ACID 1 MG PO TABS: 1.0000 mg | ORAL_TABLET | Freq: Every day | ORAL | 0 refills | Status: AC

## 2020-08-11 NOTE — Discharge Summary (Signed)
Physician Discharge Summary [facility]    Patient: George Anthony                   Admit date: 08/09/2020   DOB: [birth year]             Discharge date:08/11/2020/11:49 AM PX:[id number]                          PCP: Loman Brooklyn, FNP  Disposition: HOME   Recommendations for Outpatient Follow-up:   Follow up: [facility] care As soon as possible for further evaluation right renal mass Requested by urologist [name] for patient to be transferred to tertiary care But there were not beds available as he remained hemodynamically stable requested to be discharged so he can present himself to tertiary care center  Due to new finding of extensive right renal mass with IVC thrombus--present himself to surgery care ASAP - Patient likely will need urology, vascular and CT surgery to remove the complex right renal masses.   All information including images were forwarded to both [facility] and [facility] finally has called back to let us know regarding no bed availability.  Spoke to hospitalist Dr. Janese Banks at [facility] who excepted the patient, but no bed available    Discharge Condition: Stable   Code Status:   Code Status: Full Code  Diet recommendation: Diabetic diet   Discharge Diagnoses:    Principal Problem:   Symptomatic anemia Active Problems:   Right renal mass   Cigarette nicotine dependence without complication   DM type 2 with diabetic dyslipidemia ([place])   Hyperlipidemia associated with type 2 diabetes mellitus ([place])   Hypertension associated with type 2 diabetes mellitus ([place])   Anemia   Acute DVT (deep venous thrombosis) ([place])   History of Present Illness/ Hospital Course [name]:    George Anthony  is a 69 y.o. male, with past medical history of diabetes mellitus, hypertension, alcohol abuse, anemia, recent admission couple months ago for right lower extremity DVT on Eliquis, patient was sent by his PCP for evaluation for anemia, fall  and head trauma, but he felt lightheaded, weak, but he had a fall, and hit his head, so he was told by PCP to come for evaluation, he denies any syncope, chest pain or shortness of breath, report he is compliant with Eliquis. - in ED CT head/cervical spine with no evidence of bleed or head trauma, hemoglobin low at 6.5 (most recent baseline around 8), he was Hemoccult negative was ordered units PRBC in ED  Was further admitted for RBC transfusion, IV iron, -Subsequently due to complaint of unintentional weight loss CT chest, abdomen, pelvis, revealed right renal mass. -Urologist was consulted      Symptomatic anemia (iron/folate deficiency) -This is patient's second admission over last few months for anemia, reports he is compliant with his iron, he is Hemoccult negative, colonoscopy last year significant only for polyps/diverticulosis. -Symptomatic presents with falls, generalized weakness and lightheadedness,  -Hemoglobin 6.5 >> 7.0 this morning (after 1u PRBC) -Status post 1 unit PRBC transfusion still symptomatic, another unit will be given today  -Hemoglobin 8.5 today, no signs of bleeding  -Work-up during recent previous hospitalization significant for folate deficiency and iron deficiency anemia,  -Continue p.o. folate -We will continue p.o. iron, will transfuse 300 mg today     -Status post recent colonoscopy with no past 2 years, may need further evaluation if polyps were  found Per patient all benign- hey are aware of this and they will follow with [name] as an outpatient after discharge.     Complex right renal mass -new findings --consistent with renal cell carcinoma -Due to unintentional weight loss, CT chest, abdomen, pelvis was completed -reviewed in detail Finding consistent with large right renal mass likely renal cell carcinoma -Patient has a long history of tobacco abuse, recent weight loss -Case was discussed with urologist [name] ... Recommended transferring  the patient to tertiary care for complex right renal mass with concern of IVC thrombus, likely needing vascular, CT surgery intervention  We have reached out to [facility] and [facility]----no beds were available The patient and family has agreed to be disc charged as he remained hemodynamically stable.  Patient family was present to [facility] or [facility] as soon as possible for reevaluation of the above findings.     Lower extremity DVT -Continue with home dose Eliquis -Hemoccult negative   Recurrent falls -With head trauma, CT head/cervical spine with no bleed, patient with significant posterior scalp hematoma, will start on Keflex. - will cosnult PT/OT   Diabetes mellitus, type II, insulin-dependent -Resume Lantus  -- Resume metformin (was on hold during hospital due to CT with contrast)   Hyperlipidemia -Continue with atorvastatin   Hypertension -Stable resuming meds with exception of losartan      Consultants: Urologist [name]  ------------------------------------------------------------------------------------------------------------------------------------------------   DVT prophylaxis: Eliquis Code Status:   Code Status: Full Code   Family Communication: Discussed with patient daughter and wife at bedside The above findings and plan of care has been discussed with patient (and family)  in detail, they expressed understanding and agreement of above. -Advance care planning has been discussed.      Dispo: The patient is from: Home              Anticipated d/c is to: Home      Discharge Instructions:   Discharge Instructions     Activity as tolerated - No restrictions   Complete by: As directed    Diet - low sodium heart healthy   Complete by: As directed    Discharge instructions   Complete by: As directed    Follow-up with tertiary care hospital as soon as possible... For evaluation of right renal mass   Increase activity slowly    Complete by: As directed         Medication List     STOP taking these medications    losartan 25 MG tablet Commonly known as: COZAAR       TAKE these medications    acetaminophen 500 MG tablet Commonly known as: TYLENOL Take 1,000 mg by mouth every 6 (six) hours as needed for moderate pain.   apixaban 5 MG Tabs tablet Commonly known as: ELIQUIS Take 1 tablet (5 mg total) by mouth 2 (two) times daily.   atorvastatin 40 MG tablet Commonly known as: LIPITOR Take 1 tablet (40 mg total) by mouth at bedtime.   B-D UF III MINI PEN NEEDLES 31G X 5 MM Misc Generic drug: Insulin Pen Needle Use with insulin daily Dx E11.69   ferrous sulfate 325 (65 FE) MG tablet Take 1 tablet (325 mg total) by mouth 2 (two) times daily with a meal.   folic acid 1 MG tablet Commonly known as: FOLVITE Take 1 tablet (1 mg total) by mouth daily. Start taking on: August 12, 2020   glucose blood test strip Check  TID and PRN dx E11.9   insulin glargine-yfgn 100 UNIT/ML Soln Commonly known as: Semglee (yfgn) Inject 15 Units into the skin daily.   metFORMIN 1000 MG tablet Commonly known as: GLUCOPHAGE Take 1 tablet (1,000 mg total) by mouth 2 (two) times daily with a meal.   sitaGLIPtin 50 MG tablet Commonly known as: Januvia Take 1 tablet (50 mg total) by mouth daily.        No Known Allergies   Procedures /Studies:   CT Head Wo Contrast  Result Date: 08/09/2020 CLINICAL DATA:  Head trauma, mod-severe fall EXAM: CT HEAD WITHOUT CONTRAST TECHNIQUE: Contiguous axial images were obtained from the base of the skull through the vertex without intravenous contrast. COMPARISON:  None. FINDINGS: Brain: No evidence of acute infarction, hemorrhage, hydrocephalus, extra-axial collection or mass lesion/mass effect.The ventricles are normal in size. Vascular: No hyperdense vessel or unexpected calcification. Skull: Normal. Negative for fracture or focal lesion. Sinuses/Orbits: Scattered paranasal  sinus mucosal thickening. Other: There is a moderate size left posterior scalp hematoma. IMPRESSION: No acute intracranial abnormality. Electronically Signed   By: Maurine Simmering   On: 08/09/2020 17:23   CT Cervical Spine Wo Contrast  Result Date: 08/09/2020 CLINICAL DATA:  neck pain fall.fall 2 days ago. Patient has knot on back of head. Denies LOC. Scan delayed, RN needed to start blood prior to coming to CT. EXAM: CT CERVICAL SPINE WITHOUT CONTRAST TECHNIQUE: Multidetector CT imaging of the cervical spine was performed without intravenous contrast. Multiplanar CT image reconstructions were also generated. COMPARISON:  None. FINDINGS: Alignment: Normal. Skull base and vertebrae: Multilevel mild degenerative changes of the spine. No acute fracture. No aggressive appearing focal osseous lesion or focal pathologic process. Soft tissues and spinal canal: No prevertebral fluid or swelling. No visible canal hematoma. Upper chest: Unremarkable. Other: Left apical cystic changes. IMPRESSION: No acute displaced fracture or traumatic listhesis of the cervical spine. Electronically Signed   By: Iven Finn M.D.   On: 08/09/2020 17:48   CT CHEST ABDOMEN PELVIS W CONTRAST  Result Date: 08/09/2020 CLINICAL DATA:  Unintended weight loss, DVT, alcohol abuse EXAM: CT CHEST, ABDOMEN, AND PELVIS WITH CONTRAST TECHNIQUE: Multidetector CT imaging of the chest, abdomen and pelvis was performed following the standard protocol during bolus administration of intravenous contrast. CONTRAST:  181m OMNIPAQUE IOHEXOL 300 MG/ML  SOLN COMPARISON:  06/01/2020 FINDINGS: CT CHEST FINDINGS Cardiovascular: The heart is not enlarged. Small pericardial effusion is identified. No evidence of thoracic aortic aneurysm or dissection. Moderate atherosclerosis of the aorta and coronary vasculature. Mediastinum/Nodes: No enlarged mediastinal, hilar, or axillary lymph nodes. Thyroid gland, trachea, and esophagus demonstrate no significant findings.  Lungs/Pleura: No airspace disease, effusion, or pneumothorax. Central airways are widely patent. Musculoskeletal: Interval development of a T5 compression deformity, with approximately 50% loss of height. No retropulsion. Reconstructed images demonstrate no additional findings. CT ABDOMEN PELVIS FINDINGS Hepatobiliary: Punctate calcified gallstone identified without cholecystitis. The liver is grossly unremarkable. Pancreas: Unremarkable. No pancreatic ductal dilatation or surrounding inflammatory changes. Spleen: Normal in size without focal abnormality. Adrenals/Urinary Tract: There is a large heterogeneous mass involving the upper aspect of the right kidney, measuring 9.5 x 9.1 x 9.0 cm. This mass extends into the right renal pelvis and invades the right renal vein, with tumor thrombus identified into the inferior vena cava extending superiorly to the level of the hepatic veins. The left kidney is grossly unremarkable. Left adrenal thickening measures up to 15 mm, nonspecific. The right adrenals unremarkable. The bladder is decompressed, limiting its  evaluation. Stomach/Bowel: No bowel obstruction or ileus. No bowel wall thickening or inflammatory change. Vascular/Lymphatic: Tumor thrombus within the right renal vein and inferior vena cava as above. Numerous venous collaterals are seen within the right hemiabdomen. Moderate atherosclerosis of the aorta and its distal branches. Retroperitoneal lymphadenopathy is identified, measuring up to 11 mm in the aortocaval region, consistent with metastatic disease. Reproductive: Prostate is unremarkable. Other: No free fluid or free gas.  No abdominal wall hernia. Musculoskeletal: No acute or destructive bony lesions. Reconstructed images demonstrate no additional findings. IMPRESSION: 1. Large right renal mass consistent with renal cell carcinoma. There is extension into the right renal pelvis, with tumor thrombus extending into the right renal vein and inferior vena  cava. 2. Retroperitoneal lymphadenopathy consistent with nodal metastases. 3. Interval development of a T5 compression deformity, new since 06/01/2020 but otherwise age indeterminate. Pathologic fracture cannot be excluded in light of the right renal findings. 4. Small pericardial effusion. 5. Cholelithiasis without cholecystitis. 6. Nonspecific left adrenal thickening measuring up to 15 mm. 7.  Aortic Atherosclerosis (ICD10-I70.0). Electronically Signed   By: Randa Ngo M.D.   On: 08/09/2020 23:29    Subjective:   Patient was seen and examined 08/11/2020, 11:49 AM Patient stable today. No acute distress.  No issues overnight Stable for discharge.  Discharge Exam:    Vitals:   08/10/20 1426 08/10/20 1613 08/10/20 2107 08/11/20 0507  BP: 118/71 122/67 121/71 (!) 109/59  Pulse: 75 76 77 70  Resp:   20 17  Temp:   98.2 F (36.8 C) 98.2 F (36.8 C)  TempSrc:   Oral   SpO2: 97% 95% 98% 97%  Weight:      Height:        General: Pt lying comfortably in bed & appears in no obvious distress. Cardiovascular: S1 & S2 heard, RRR, S1/S2 +. No murmurs, rubs, gallops or clicks. No JVD or pedal edema. Respiratory: Clear to auscultation without wheezing, rhonchi or crackles. No increased work of breathing. Abdominal:  Non-distended, non-tender & soft. No organomegaly or masses appreciated. Normal bowel sounds heard. CNS: Alert and oriented. No focal deficits. Extremities: no edema, no cyanosis      The results of significant diagnostics from this hospitalization (including imaging, microbiology, ancillary and laboratory) are listed below for reference.      Microbiology:   Recent Results (from the past 240 hour(s))  Resp Panel by RT-PCR (Flu A&B, Covid) Nasopharyngeal Swab     Status: None   Collection Time: 08/09/20  1:49 PM   Specimen: Nasopharyngeal Swab; Nasopharyngeal(NP) swabs in vial transport medium  Result Value Ref Range Status   SARS Coronavirus 2 by RT PCR NEGATIVE  NEGATIVE Final    Comment: (NOTE) SARS-CoV-2 target nucleic acids are NOT DETECTED.  The SARS-CoV-2 RNA is generally detectable in upper respiratory specimens during the acute phase of infection. The lowest concentration of SARS-CoV-2 viral copies this assay can detect is 138 copies/mL. A negative result does not preclude SARS-Cov-2 infection and should not be used as the sole basis for treatment or other patient management decisions. A negative result may occur with  improper specimen collection/handling, submission of specimen other than nasopharyngeal swab, presence of viral mutation(s) within the areas targeted by this assay, and inadequate number of viral copies(<138 copies/mL). A negative result must be combined with clinical observations, patient history, and epidemiological information. The expected result is Negative.  Fact Sheet for Patients:  EntrepreneurPulse.com.au  Fact Sheet for Healthcare Providers:  IncredibleEmployment.be  This  test is no t yet approved or cleared by the [place] and  has been authorized for detection and/or diagnosis of SARS-CoV-2 by FDA under an Emergency Use Authorization (EUA). This EUA will remain  in effect (meaning this test can be used) for the duration of the COVID-19 declaration under Section 564(b)(1) of the Act, 21 U.S.C.section 360bbb-3(b)(1), unless the authorization is terminated  or revoked sooner.       Influenza A by PCR NEGATIVE NEGATIVE Final   Influenza B by PCR NEGATIVE NEGATIVE Final    Comment: (NOTE) The Xpert Xpress SARS-CoV-2/FLU/RSV plus assay is intended as an aid in the diagnosis of influenza from Nasopharyngeal swab specimens and should not be used as a sole basis for treatment. Nasal washings and aspirates are unacceptable for Xpert Xpress SARS-CoV-2/FLU/RSV testing.  Fact Sheet for Patients: EntrepreneurPulse.com.au  Fact Sheet for Healthcare  Providers: IncredibleEmployment.be  This test is not yet approved or cleared by the [place] FDA and has been authorized for detection and/or diagnosis of SARS-CoV-2 by FDA under an Emergency Use Authorization (EUA). This EUA will remain in effect (meaning this test can be used) for the duration of the COVID-19 declaration under Section 564(b)(1) of the Act, 21 U.S.C. section 360bbb-3(b)(1), unless the authorization is terminated or revoked.  Performed at [facility], [address] [place], [place] [address]      Labs:   CBC: Recent Labs  Lab 08/09/20 [phone] 08/09/20 1130 08/10/20 0442 08/10/20 1832 08/11/20 0817  WBC 12.0* 12.3* 9.5  --  9.7  NEUTROABS 6.8 7.3  --   --   --   HGB 6.6* 6.5* 7.0* 8.7* 8.5*  HCT 22.8* 23.8* 25.8* 30.8* 29.6*  MCV 75* 82.6 84.3  --  83.1  PLT 561* 546* 459*  --  123456*   Basic Metabolic Panel: Recent Labs  Lab 08/09/20 0925 08/09/20 1130 08/10/20 0442  NA 135 132* 132*  K 4.6 3.8 3.9  CL 95* 95* 97*  CO2 '24 28 27  '$ GLUCOSE 60* 104* 79  BUN '13 14 12  '$ CREATININE 0.59* 0.77 0.64  CALCIUM 8.8 8.3* 8.3*   Liver Function Tests: Recent Labs  Lab 08/09/20 0925  AST 7  ALT 13  ALKPHOS 184*  BILITOT <0.2  PROT 6.6  ALBUMIN 3.0*   BNP (last 3 results) Recent Labs    06/01/20 1258  BNP 82.0   Cardiac Enzymes: No results for input(s): CKTOTAL, CKMB, CKMBINDEX, TROPONINI in the last 168 hours. CBG: Recent Labs  Lab 08/10/20 0736 08/10/20 1126 08/10/20 1630 08/11/20 0807 08/11/20 1111  GLUCAP 74 121* 110* 119* 135*   Hgb A1c No results for input(s): HGBA1C in the last 72 hours. Lipid Profile Recent Labs    08/09/20 0925  CHOL 105  HDL 37*  LDLCALC 54  TRIG 67  CHOLHDL 2.8   Thyroid function studies No results for input(s): TSH, T4TOTAL, T3FREE, THYROIDAB in the last 72 hours.  Invalid input(s): FREET3 Anemia work up Recent Labs    08/09/20 0925  VITAMINB12 1,109  FOLATE 2.4*  FERRITIN  269  TIBC 229*  IRON 18*  RETICCTPCT 2.1   Urinalysis    Component Value Date/Time   COLORURINE YELLOW 11/19/2011 0158   APPEARANCEUR CLEAR 11/19/2011 0158   LABSPEC >1.030 (H) 11/19/2011 0158   PHURINE 6.0 11/19/2011 0158   GLUCOSEU NEGATIVE 11/19/2011 0158   HGBUR MODERATE (A) 11/19/2011 0158   BILIRUBINUR MODERATE (A) 11/19/2011 0158   KETONESUR 40 (A) 11/19/2011 0158  PROTEINUR 30 (A) 11/19/2011 0158   UROBILINOGEN 2.0 (H) 11/19/2011 0158   NITRITE NEGATIVE 11/19/2011 0158   LEUKOCYTESUR NEGATIVE 11/19/2011 0158         Time coordinating discharge: Over 45 minutes  SIGNED: Deatra James, MD, FACP, [facility]. [facility],  Please use [email or web address] to Page If 7PM-7AM, please contact night-coverage [email or web address] 08/11/2020, 11:49 AM

## 2020-08-12 ENCOUNTER — Encounter: Payer: Self-pay | Admitting: Family Medicine

## 2020-08-12 DIAGNOSIS — F17210 Nicotine dependence, cigarettes, uncomplicated: Secondary | ICD-10-CM | POA: Diagnosis not present

## 2020-08-12 DIAGNOSIS — N2889 Other specified disorders of kidney and ureter: Secondary | ICD-10-CM | POA: Diagnosis not present

## 2020-08-12 NOTE — Assessment & Plan Note (Signed)
Lab Results  Component Value Date   HGBA1C 6.6 (H) 06/02/2020   HGBA1C 7.9 (H) 03/22/2020   HGBA1C 6.6 08/25/2019    - Diabetes is at goal of A1c < 7. - Medications: continue Lantus and Metformin. D/C Glipizide due to episodes of hypoglycemia. Started Januvia. - Home glucose monitoring: continue monitoring. Patient declined CGM. - Patient is currently taking a statin. Patient is taking an ACE-inhibitor/ARB.  - Instruction/counseling given: reminded to get eye exam  Diabetes Health Maintenance Due  Topic Date Due  . OPHTHALMOLOGY EXAM  Never done  . URINE MICROALBUMIN  08/24/2020  . HEMOGLOBIN A1C  12/03/2020  . FOOT EXAM  03/22/2021

## 2020-08-12 NOTE — Assessment & Plan Note (Signed)
On Atorvastatin daily. Labs to assess.

## 2020-08-12 NOTE — Assessment & Plan Note (Signed)
Well controlled on current regimen of Losartan.

## 2020-08-12 NOTE — Assessment & Plan Note (Signed)
Continue iron supplement twice daily.

## 2020-08-13 LAB — GLUCOSE, CAPILLARY: Glucose-Capillary: 223 mg/dL — ABNORMAL HIGH (ref 70–99)

## 2020-08-13 LAB — HEMOGLOBIN A1C
Hgb A1c MFr Bld: <4.2 % — ABNORMAL LOW (ref 4.8–5.6)
Mean Plasma Glucose: <74 mg/dL

## 2020-08-14 DIAGNOSIS — I823 Embolism and thrombosis of renal vein: Secondary | ICD-10-CM | POA: Diagnosis not present

## 2020-08-14 DIAGNOSIS — N2889 Other specified disorders of kidney and ureter: Secondary | ICD-10-CM | POA: Diagnosis not present

## 2020-08-14 DIAGNOSIS — R918 Other nonspecific abnormal finding of lung field: Secondary | ICD-10-CM | POA: Diagnosis not present

## 2020-08-14 DIAGNOSIS — I313 Pericardial effusion (noninflammatory): Secondary | ICD-10-CM | POA: Diagnosis not present

## 2020-08-14 DIAGNOSIS — Z87891 Personal history of nicotine dependence: Secondary | ICD-10-CM | POA: Diagnosis not present

## 2020-08-14 DIAGNOSIS — R590 Localized enlarged lymph nodes: Secondary | ICD-10-CM | POA: Diagnosis not present

## 2020-08-15 ENCOUNTER — Telehealth: Payer: Self-pay | Admitting: Family Medicine

## 2020-08-15 NOTE — Telephone Encounter (Signed)
Pt wife called - she said that all DM meds were stopped due to low A1C = does not currently have a f/u here -  Waiting on tumor surgery to be scheduled   Would you like to see him before surgery?

## 2020-08-15 NOTE — Telephone Encounter (Signed)
Appt made

## 2020-08-15 NOTE — Telephone Encounter (Signed)
Pt wife called back = I offered virtual appt - declined  She says that the kidney issue is there priority and causing him to feel so bad.   They will follow up here once better from that.

## 2020-08-15 NOTE — Telephone Encounter (Signed)
We need to continue to care for him despite his need for surgery (that is only one health issue). I understand he has a lot going on right now, but we need to take care of all of him (not just the kidney). I recommend he reschedule

## 2020-08-16 ENCOUNTER — Ambulatory Visit: Payer: BC Managed Care – PPO | Admitting: Family Medicine

## 2020-08-17 ENCOUNTER — Ambulatory Visit: Payer: BC Managed Care – PPO | Admitting: Family Medicine

## 2020-08-23 DIAGNOSIS — C641 Malignant neoplasm of right kidney, except renal pelvis: Secondary | ICD-10-CM | POA: Insufficient documentation

## 2020-08-30 ENCOUNTER — Telehealth: Payer: Self-pay | Admitting: Family Medicine

## 2020-08-30 NOTE — Telephone Encounter (Signed)
I am okay with a note being provided that patient is in need of assistance due to medical conditions.

## 2020-08-31 ENCOUNTER — Other Ambulatory Visit: Payer: Self-pay

## 2020-08-31 NOTE — Telephone Encounter (Signed)
Janett Billow and I both talked to pts daughter and let her know we could not give the note she was asking for because her name was not on file where she has been at appt, [name] and Janett Billow have never met tasha. I did not give note told pt we will see what joyce says on Tuesday.

## 2020-08-31 NOTE — Telephone Encounter (Signed)
Needs note to state that patient is under Brit[facility] care and also state that patients daughter has needed to be out of work with patient since 08/10/20 due to patients health issues and pt needing support. Claim # needs to be listed as well.[name] FT:[id number]  Needs note asap. Please call daughter when letter is ready. [phone]

## 2020-09-03 ENCOUNTER — Telehealth: Payer: Self-pay | Admitting: Family Medicine

## 2020-09-03 NOTE — Telephone Encounter (Signed)
Patients daughter Aniceto Boss called requesting to speak with Abigail Butts regarding her FMLA paperwork needing to be signed to help take care of patient.

## 2020-09-04 NOTE — Telephone Encounter (Signed)
Spoke to daughter and she is having FMLA forms sent to our office.

## 2020-09-06 ENCOUNTER — Telehealth: Payer: Self-pay | Admitting: Family Medicine

## 2020-09-06 NOTE — Telephone Encounter (Signed)
Patient is being discharged this weekend and needs a hospital follow up in 10 days.  Appointment schedued on 09/18/20 with Hendricks Limes.  Asked for notes to be faxed to her attention.

## 2020-09-18 ENCOUNTER — Ambulatory Visit: Payer: BC Managed Care – PPO | Admitting: Family Medicine

## 2020-09-19 ENCOUNTER — Encounter: Payer: Self-pay | Admitting: Family Medicine

## 2020-10-22 ENCOUNTER — Telehealth: Payer: Self-pay | Admitting: Family Medicine

## 2020-10-22 NOTE — Telephone Encounter (Signed)
  Prescription Request  10/22/2020  Is this a "Controlled Substance" medicine? no Have you seen your PCP in the last 2 weeks? no If YES, route message to pool  -  If NO, patient needs to be scheduled for appointment.  What is the name of the medication or equipment? Eloquis  Have you contacted your pharmacy to request a refill?  yes Which pharmacy would you like this sent to? [facility]   Patient notified that their request is being sent to the clinical staff for review and that they should receive a response within 2 business days.    Joyce's pt.  Hosp put him on Lexapro, but I told wife that he would have to be seen before they would refill this.  Please call pt.

## 2020-10-22 NOTE — Telephone Encounter (Signed)
Eliquis last filled 06/09/20 by [facility] hospital provider Lexapro in reconciled meds from [facility] hospital provider during hosp admit from 8/4 - 8/24 Pt has an appt scheduled 11/07/20 Please advise

## 2020-10-23 MED ORDER — ESCITALOPRAM OXALATE 10 MG PO TABS: 10.0000 mg | ORAL_TABLET | Freq: Every day | ORAL | 0 refills | Status: DC

## 2020-10-23 MED ORDER — APIXABAN 5 MG PO TABS: 5.0000 mg | ORAL_TABLET | Freq: Two times a day (BID) | ORAL | 0 refills | Status: DC

## 2020-10-23 NOTE — Telephone Encounter (Signed)
Eliquis filled for 90 days. Lexapro filled for 15 days to get him to his follow-up appointment with me. No further refills if he does not follow-up.

## 2020-10-26 NOTE — Telephone Encounter (Signed)
Left message to call back  

## 2020-10-30 NOTE — Telephone Encounter (Signed)
Multiple attempts to contact pt without return call in over 3 days, will close encounter-pt has appt scheduled for 11/07/20.

## 2020-11-07 ENCOUNTER — Ambulatory Visit: Payer: BC Managed Care – PPO | Admitting: Family Medicine

## 2020-11-07 ENCOUNTER — Encounter: Payer: Self-pay | Admitting: Family Medicine

## 2020-11-07 ENCOUNTER — Other Ambulatory Visit: Payer: Self-pay

## 2020-11-07 VITALS — BP 140/54 | HR 103 | Temp 98.3°F | Ht 67.0 in | Wt 172.6 lb

## 2020-11-07 DIAGNOSIS — F411 Generalized anxiety disorder: Secondary | ICD-10-CM

## 2020-11-07 DIAGNOSIS — E1159 Type 2 diabetes mellitus with other circulatory complications: Secondary | ICD-10-CM

## 2020-11-07 DIAGNOSIS — E1169 Type 2 diabetes mellitus with other specified complication: Secondary | ICD-10-CM

## 2020-11-07 DIAGNOSIS — E11649 Type 2 diabetes mellitus with hypoglycemia without coma: Secondary | ICD-10-CM

## 2020-11-07 DIAGNOSIS — Z794 Long term (current) use of insulin: Secondary | ICD-10-CM

## 2020-11-07 DIAGNOSIS — E538 Deficiency of other specified B group vitamins: Secondary | ICD-10-CM

## 2020-11-07 DIAGNOSIS — L853 Xerosis cutis: Secondary | ICD-10-CM

## 2020-11-07 DIAGNOSIS — I152 Hypertension secondary to endocrine disorders: Secondary | ICD-10-CM

## 2020-11-07 DIAGNOSIS — Z85528 Personal history of other malignant neoplasm of kidney: Secondary | ICD-10-CM

## 2020-11-07 DIAGNOSIS — Z09 Encounter for follow-up examination after completed treatment for conditions other than malignant neoplasm: Secondary | ICD-10-CM | POA: Diagnosis not present

## 2020-11-07 DIAGNOSIS — D509 Iron deficiency anemia, unspecified: Secondary | ICD-10-CM

## 2020-11-07 DIAGNOSIS — E785 Hyperlipidemia, unspecified: Secondary | ICD-10-CM

## 2020-11-07 LAB — BAYER DCA HB A1C WAIVED: HB A1C (BAYER DCA - WAIVED): 5.2 % (ref 4.8–5.6)

## 2020-11-07 MED ORDER — ESCITALOPRAM OXALATE 10 MG PO TABS: 10.0000 mg | ORAL_TABLET | Freq: Every day | ORAL | 1 refills | Status: DC

## 2020-11-07 MED ORDER — INSULIN GLARGINE-YFGN 100 UNIT/ML ~~LOC~~ SOLN: 15.0000 [IU] | Freq: Every day | SUBCUTANEOUS | 1 refills | Status: DC

## 2020-11-07 MED ORDER — ATORVASTATIN CALCIUM 40 MG PO TABS: 40.0000 mg | ORAL_TABLET | Freq: Every day | ORAL | 1 refills | Status: DC

## 2020-11-07 MED ORDER — GLIPIZIDE 5 MG PO TABS: 5.0000 mg | ORAL_TABLET | Freq: Two times a day (BID) | ORAL | 1 refills | Status: DC

## 2020-11-07 MED ORDER — FOLIC ACID 1 MG PO TABS: 1.0000 mg | ORAL_TABLET | Freq: Every day | ORAL | 1 refills | Status: DC

## 2020-11-07 MED ORDER — METFORMIN HCL 500 MG PO TABS: 500.0000 mg | ORAL_TABLET | Freq: Two times a day (BID) | ORAL | 1 refills | Status: DC

## 2020-11-07 MED ORDER — FERROUS SULFATE 325 (65 FE) MG PO TABS: 325.0000 mg | ORAL_TABLET | Freq: Two times a day (BID) | ORAL | 1 refills | Status: DC

## 2020-11-07 NOTE — Progress Notes (Signed)
Assessment & Plan:  1. Hospital discharge follow-up  2. History of renal cell carcinoma - followed by Urology  3. Type 2 diabetes mellitus with hypoglycemia without coma, with long-term current use of insulin (HCC) - decrease metformin to 500 mg BID due to kidney function - restart glipizide 5 mg BID - reordered insulin 15 units qd - discussed may not restart but need A1c result first before determining this - CMP14+EGFR - Lipid panel - Bayer DCA Hb A1c Waived - Anemia Profile B - metFORMIN (GLUCOPHAGE) 500 MG tablet; Take 1 tablet (500 mg total) by mouth 2 (two) times daily with a meal.  Dispense: 180 tablet; Refill: 1 - glipiZIDE (GLUCOTROL) 5 MG tablet; Take 1 tablet (5 mg total) by mouth 2 (two) times daily before a meal.  Dispense: 180 tablet; Refill: 1 - insulin glargine-yfgn (SEMGLEE, YFGN,) 100 UNIT/ML injection; Inject 0.15 mLs (15 Units total) into the skin daily.  Dispense: 10 mL; Refill: 1  4. Hypertension associated with type 2 diabetes mellitus (HCC) Elevated today; will follow - CMP14+EGFR - Lipid panel - Magnesium - Anemia Profile B  5. Hyperlipidemia associated with type 2 diabetes mellitus (HCC) - CMP14+EGFR - Lipid panel - atorvastatin (LIPITOR) 40 MG tablet; Take 1 tablet (40 mg total) by mouth at bedtime.  Dispense: 90 tablet; Refill: 1  6. Generalized anxiety disorder Well controlled on current regimen.  - escitalopram (LEXAPRO) 10 MG tablet; Take 1 tablet (10 mg total) by mouth daily.  Dispense: 90 tablet; Refill: 1  7. Iron deficiency anemia, unspecified iron deficiency anemia type - Anemia Profile B - ferrous sulfate 325 (65 FE) MG tablet; Take 1 tablet (325 mg total) by mouth 2 (two) times daily with a meal.  Dispense: 180 tablet; Refill: 1  8. Folate deficiency - Anemia Profile B - folic acid (FOLVITE) 1 MG tablet; Take 1 tablet (1 mg total) by mouth daily.  Dispense: 90 tablet; Refill: 1  9. Dry skin - encouraged used of good thick lotion -  TSH   Return in about 3 months (around 02/07/2021) for follow-up of chronic medication conditions.  Lucile Crater, NP Student  I personally was present during the history, physical exam, and medical decision-making activities of this service and have verified that the service and findings are accurately documented in the nurse practitioner student's note.  Hendricks Limes, MSN, APRN, FNP-C [facility]   Subjective:    Patient ID: George Anthony, male    DOB: January 09, 1951, 70 y.o.   MRN: [id number]  Patient Care Team: Loman Brooklyn, FNP as PCP - General (Family Medicine) Eloise Harman, DO as Consulting Physician (Internal Medicine) Harlen Labs, MD as Referring Physician (Optometry)   Chief Complaint:  Chief Complaint  Patient presents with   Hospitalization Follow-up    Renal mass     HPI: George Anthony is a 70 y.o. male presenting on 11/07/2020 for Hospitalization Follow-up (Renal mass )  At his last visit (7/21) he was noted to be anemic on his labs from a recent admission at [facility]. He was sent back to [facility] for anemia, dizziness, and post fall with head injury to r/o brain bleed. A renal mass was discovered while admitted during work up of anemia. 7/24[facility] for cancer treatment including right nephrectomy, IVC thrombectomy, and cholecystectomy. 8/14 transferred to nursing unit and then discharged to [facility] for rehab for 7 days only due to insurance. He will continue with outpatient  rehab.   He states he is out of several of his medicines. Since he was discharged home, he has only been taking metformin 1000 mg BID and glipizide 5 mg BID for his diabetes. He was supposed to stop glipizide at his last visit and start Januvia, but he did not get to pick up the prescription due to hospitalization. At some point during the hospitalization, his refrigerator broke at home and his insulin expired, so he did not resume this when he  got home. He has not been checking his blood sugars at home. He states he lost 55 lbs with his cancer but has since gained back 20 lbs and is eating well again.   He was placed on Lexapro in the hospital for anxiety about surgery, he states he is taking this and doing well on it.   Depression screen [facility] 2/9 11/07/2020 08/09/2020 03/22/2020  Decreased Interest 0 0 0  Down, Depressed, Hopeless 0 0 0  PHQ - 2 Score 0 0 0  Altered sleeping 1 0 -  Tired, decreased energy 2 2 -  Change in appetite 0 3 -  Feeling bad or failure about yourself  0 0 -  Trouble concentrating 0 0 -  Moving slowly or fidgety/restless 0 0 -  Suicidal thoughts 0 0 -  PHQ-9 Score 3 5 -  Difficult doing work/chores Not difficult at all Very difficult -   GAD 7 : Generalized Anxiety Score 11/07/2020 08/09/2020  Nervous, Anxious, on Edge 0 0  Control/stop worrying 2 0  Worry too much - different things 0 0  Trouble relaxing 0 0  Restless 0 0  Easily annoyed or irritable 3 1  Afraid - awful might happen 0 0  Total GAD 7 Score 5 1  Anxiety Difficulty Not difficult at all Not difficult at all    Reporting dry skin all over.  Social history:  Relevant past medical, surgical, family and social history reviewed and updated as indicated. Interim medical history since our last visit reviewed.  Allergies and medications reviewed and updated.  DATA REVIEWED: CHART IN EPIC  ROS: Negative unless specifically indicated above in HPI.    Current Outpatient Medications:    acetaminophen (TYLENOL) 500 MG tablet, Take 1,000 mg by mouth every 6 (six) hours as needed for moderate pain., Disp: , Rfl:    apixaban (ELIQUIS) 5 MG TABS tablet, Take 1 tablet (5 mg total) by mouth 2 (two) times daily., Disp: 180 tablet, Rfl: 0   B-D UF III MINI PEN NEEDLES 31G X 5 MM MISC, Use with insulin daily Dx E11.69, Disp: 100 each, Rfl: 3   folic acid (FOLVITE) 1 MG tablet, Take 1 tablet (1 mg total) by mouth daily., Disp: 90 tablet, Rfl: 1    glipiZIDE (GLUCOTROL) 5 MG tablet, Take 1 tablet (5 mg total) by mouth 2 (two) times daily before a meal., Disp: 180 tablet, Rfl: 1   glucose blood test strip, Check TID and PRN dx E11.9, Disp: 100 each, Rfl: 11   atorvastatin (LIPITOR) 40 MG tablet, Take 1 tablet (40 mg total) by mouth at bedtime., Disp: 90 tablet, Rfl: 1   escitalopram (LEXAPRO) 10 MG tablet, Take 1 tablet (10 mg total) by mouth daily., Disp: 90 tablet, Rfl: 1   ferrous sulfate 325 (65 FE) MG tablet, Take 1 tablet (325 mg total) by mouth 2 (two) times daily with a meal., Disp: 180 tablet, Rfl: 1   insulin glargine-yfgn (SEMGLEE, YFGN,) 100 UNIT/ML injection, Inject 0.15  mLs (15 Units total) into the skin daily., Disp: 10 mL, Rfl: 1   metFORMIN (GLUCOPHAGE) 500 MG tablet, Take 1 tablet (500 mg total) by mouth 2 (two) times daily with a meal., Disp: 180 tablet, Rfl: 1   No Known Allergies Past Medical History:  Diagnosis Date   Diabetes mellitus without complication ([place])    Enlarged heart    Hyperlipidemia    Hypertension     Past Surgical History:  Procedure Laterality Date   BIOPSY  09/16/2019   Procedure: BIOPSY;  Surgeon: Eloise Harman, DO;  [facility];  Service: Endoscopy;;   COLONOSCOPY WITH PROPOFOL N/A 09/16/2019   Procedure: COLONOSCOPY WITH PROPOFOL;  Surgeon: Eloise Harman, DO;  [facility];  Service: Endoscopy;  Laterality: N/A;  7:30am   POLYPECTOMY  09/16/2019   Procedure: POLYPECTOMY;  Surgeon: Eloise Harman, DO;  [facility];  Service: Endoscopy;;    Social History   Socioeconomic History   Marital status: Married    Spouse name: charlene   Number of children: 1   Years of education: Not on file   Highest education level: Not on file  Occupational History    Employer: [employer]  Tobacco Use   Smoking status: Every Day    Packs/day: 1.00    Years: 40.00    Pack years: 40.00    Types: Cigarettes   Smokeless tobacco: Never  Vaping Use    Vaping Use: Never used  Substance and Sexual Activity   Alcohol use: Yes    Alcohol/week: 3.0 standard drinks    Types: 3 Cans of beer per week    Comment: drinking a couple times a month for the last year.- used to drink 2 12 packs a week   Drug use: No   Sexual activity: Yes  Other Topics Concern   Not on file  Social History Narrative   Not on file   Social Determinants of Health   Financial Resource Strain: Not on file  Food Insecurity: Not on file  Transportation Needs: Not on file  Physical Activity: Not on file  Stress: Not on file  Social Connections: Not on file  Intimate Partner Violence: Not on file        Objective:    BP (!) 140/54   Pulse (!) 103   Temp 98.3 F (36.8 C) (Temporal)   Ht _0  (1.702 m)   Wt 78.3 kg   SpO2 98%   BMI 27.03 kg/m   Wt Readings from Last 3 Encounters:  11/07/20 172 lb 9.6 oz (78.3 kg)  08/10/20 174 lb 6.4 oz (79.1 kg)  08/09/20 174 lb 6.4 oz (79.1 kg)    Physical Exam Vitals reviewed.  Constitutional:      General: He is not in acute distress.    Appearance: Normal appearance. He is overweight. He is not ill-appearing, toxic-appearing or diaphoretic.  HENT:     Head: Normocephalic and atraumatic.  Eyes:     General: No scleral icterus.       Right eye: No discharge.        Left eye: No discharge.     Conjunctiva/sclera: Conjunctivae normal.  Cardiovascular:     Rate and Rhythm: Normal rate and regular rhythm.     Heart sounds: Normal heart sounds. No murmur heard.   No friction rub. No gallop.  Pulmonary:     Effort: Pulmonary effort is normal. No respiratory distress.     Breath  sounds: Normal breath sounds. No stridor. No wheezing, rhonchi or rales.  Musculoskeletal:        General: Normal range of motion.     Cervical back: Normal range of motion.     Right lower leg: No edema.     Left lower leg: No edema.  Skin:    General: Skin is warm and dry.     Comments: Generalized dry, flaky skin  Neurological:      Mental Status: He is alert and oriented to person, place, and time. Mental status is at baseline.  Psychiatric:        Mood and Affect: Mood normal.        Behavior: Behavior normal.        Thought Content: Thought content normal.        Judgment: Judgment normal.    Lab Results  Component Value Date   TSH 1.560 08/26/2018   Lab Results  Component Value Date   WBC 9.7 08/11/2020   HGB 8.5 (L) 08/11/2020   HCT 29.6 (L) 08/11/2020   MCV 83.1 08/11/2020   PLT 416 (H) 08/11/2020   Lab Results  Component Value Date   NA 132 (L) 08/10/2020   K 3.9 08/10/2020   CO2 27 08/10/2020   GLUCOSE 79 08/10/2020   BUN 12 08/10/2020   CREATININE 0.64 08/10/2020   BILITOT <0.2 08/09/2020   ALKPHOS 184 (H) 08/09/2020   AST 7 08/09/2020   ALT 13 08/09/2020   PROT 6.6 08/09/2020   ALBUMIN 3.0 (L) 08/09/2020   CALCIUM 8.3 (L) 08/10/2020   ANIONGAP 8 08/10/2020   EGFR 105 08/09/2020   Lab Results  Component Value Date   CHOL 105 08/09/2020   Lab Results  Component Value Date   HDL 37 (L) 08/09/2020   Lab Results  Component Value Date   LDLCALC 54 08/09/2020   Lab Results  Component Value Date   TRIG 67 08/09/2020   Lab Results  Component Value Date   CHOLHDL 2.8 08/09/2020   Lab Results  Component Value Date   HGBA1C <4.2 (L) 08/09/2020

## 2020-11-08 ENCOUNTER — Other Ambulatory Visit: Payer: Self-pay | Admitting: Family Medicine

## 2020-11-08 DIAGNOSIS — D509 Iron deficiency anemia, unspecified: Secondary | ICD-10-CM

## 2020-11-08 LAB — ANEMIA PROFILE B
Basophils Absolute: 0.1 10*3/uL (ref 0.0–0.2)
Basos: 1 %
EOS (ABSOLUTE): 0.2 10*3/uL (ref 0.0–0.4)
Eos: 2 %
Ferritin: 117 ng/mL (ref 30–400)
Folate: >20 ng/mL (ref >3.0)
Hematocrit: 39.2 % (ref 37.5–51.0)
Hemoglobin: 12.8 g/dL — ABNORMAL LOW (ref 13.0–17.7)
Immature Grans (Abs): 0 10*3/uL (ref 0.0–0.1)
Immature Granulocytes: 0 %
Iron Saturation: 23 % (ref 15–55)
Iron: 73 ug/dL (ref 38–169)
Lymphocytes Absolute: 4.2 10*3/uL — ABNORMAL HIGH (ref 0.7–3.1)
Lymphs: 40 %
MCH: 29.3 pg (ref 26.6–33.0)
MCHC: 32.7 g/dL (ref 31.5–35.7)
MCV: 90 fL (ref 79–97)
Monocytes Absolute: 0.7 10*3/uL (ref 0.1–0.9)
Monocytes: 7 %
Neutrophils Absolute: 5.2 10*3/uL (ref 1.4–7.0)
Neutrophils: 50 %
Platelets: 174 10*3/uL (ref 150–450)
RBC: 4.37 x10E6/uL (ref 4.14–5.80)
RDW: 15.3 % (ref 11.6–15.4)
Retic Ct Pct: 1.4 % (ref 0.6–2.6)
Total Iron Binding Capacity: 313 ug/dL (ref 250–450)
UIBC: 240 ug/dL (ref 111–343)
Vitamin B-12: 403 pg/mL (ref 232–1245)
WBC: 10.4 10*3/uL (ref 3.4–10.8)

## 2020-11-08 LAB — TSH: TSH: 2.7 u[IU]/mL (ref 0.450–4.500)

## 2020-11-08 LAB — CMP14+EGFR
ALT: 8 IU/L (ref 0–44)
AST: 10 IU/L (ref 0–40)
Albumin/Globulin Ratio: 2.1 (ref 1.2–2.2)
Albumin: 4.5 g/dL (ref 3.8–4.8)
Alkaline Phosphatase: 105 IU/L (ref 44–121)
BUN/Creatinine Ratio: 21 (ref 10–24)
BUN: 18 mg/dL (ref 8–27)
Bilirubin Total: 0.3 mg/dL (ref 0.0–1.2)
CO2: 23 mmol/L (ref 20–29)
Calcium: 9.8 mg/dL (ref 8.6–10.2)
Chloride: 102 mmol/L (ref 96–106)
Creatinine, Ser: 0.87 mg/dL (ref 0.76–1.27)
Globulin, Total: 2.1 g/dL (ref 1.5–4.5)
Glucose: 78 mg/dL (ref 70–99)
Potassium: 4.6 mmol/L (ref 3.5–5.2)
Sodium: 141 mmol/L (ref 134–144)
Total Protein: 6.6 g/dL (ref 6.0–8.5)
eGFR: 93 mL/min/{1.73_m2} (ref >59)

## 2020-11-08 LAB — LIPID PANEL
Chol/HDL Ratio: 2.7 ratio (ref 0.0–5.0)
Cholesterol, Total: 178 mg/dL (ref 100–199)
HDL: 65 mg/dL (ref >39)
LDL Chol Calc (NIH): 87 mg/dL (ref 0–99)
Triglycerides: 150 mg/dL — ABNORMAL HIGH (ref 0–149)
VLDL Cholesterol Cal: 26 mg/dL (ref 5–40)

## 2020-11-08 LAB — MAGNESIUM: Magnesium: 1.8 mg/dL (ref 1.6–2.3)

## 2020-11-08 MED ORDER — FERROUS SULFATE 325 (65 FE) MG PO TABS: 325.0000 mg | ORAL_TABLET | Freq: Every day | ORAL | 1 refills | Status: DC

## 2020-11-09 ENCOUNTER — Telehealth: Payer: Self-pay

## 2020-11-09 NOTE — Telephone Encounter (Signed)
No need to do PA. I discontinued the medication after his labs resulted.

## 2020-11-09 NOTE — Telephone Encounter (Signed)
Received PA for Semglee  KEY- [id number]  Does not need PA: Lantus Lantus solostar Levemir Levemir flextouch Toujeo max solostar Toujeo solostar Tresiba flextouch  Would you like to change medication or start PA?

## 2020-11-21 ENCOUNTER — Other Ambulatory Visit: Payer: Self-pay | Admitting: Family Medicine

## 2020-11-21 ENCOUNTER — Other Ambulatory Visit: Payer: Self-pay

## 2020-11-21 ENCOUNTER — Ambulatory Visit: Payer: BC Managed Care – PPO | Attending: Urology

## 2020-11-21 DIAGNOSIS — I152 Hypertension secondary to endocrine disorders: Secondary | ICD-10-CM

## 2020-11-21 DIAGNOSIS — M6281 Muscle weakness (generalized): Secondary | ICD-10-CM

## 2020-11-21 DIAGNOSIS — E1159 Type 2 diabetes mellitus with other circulatory complications: Secondary | ICD-10-CM

## 2020-11-21 DIAGNOSIS — R2689 Other abnormalities of gait and mobility: Secondary | ICD-10-CM | POA: Diagnosis present

## 2020-11-21 NOTE — Therapy (Addendum)
[facility] [place], [place], [address] Phone: [phone]   Fax:  [phone]  Physical Therapy Evaluation  Patient Details  Name: George Anthony MRN: [id number] Date of Birth: [birth year] Referring Provider (PT): Tsivian   Encounter Date: 11/21/2020   PT End of Session - 11/21/20 1305     Visit Number 1    Number of Visits 6    Date for PT Re-Evaluation 01/18/21    PT Start Time 1311    PT Stop Time 1401    PT Time Calculation (min) 50 min    Activity Tolerance Patient tolerated treatment well    Behavior During Therapy [facility] for tasks assessed/performed             Past Medical History:  Diagnosis Date   Diabetes mellitus without complication ([place])    Enlarged heart    Hyperlipidemia    Hypertension     Past Surgical History:  Procedure Laterality Date   BIOPSY  09/16/2019   Procedure: BIOPSY;  Surgeon: Eloise Harman, DO;  [facility];  Service: Endoscopy;;   COLONOSCOPY WITH PROPOFOL N/A 09/16/2019   Procedure: COLONOSCOPY WITH PROPOFOL;  Surgeon: Eloise Harman, DO;  [facility];  Service: Endoscopy;  Laterality: N/A;  7:30am   POLYPECTOMY  09/16/2019   Procedure: POLYPECTOMY;  Surgeon: Eloise Harman, DO;  [facility];  Service: Endoscopy;;    There were no vitals filed for this visit.    Subjective Assessment - 11/21/20 1303     Subjective Patient reports that he had surgery on 9/16 to remove a tumor on his kidneys. He notes that weakness is his primary limitation. He wants to return to work lifting about 15 pounds from shoulder height to the floor. He has been out of work for about 6 months. He notes that he has fallen multiple times, but none of these occured since his surgery. He was told all his numbers were looking good at his last follow up with his physician.    Patient is accompained by: Family member    Pertinent History Kidney cancer, HTN,  Diabetes    Limitations Walking;Other (comment);Lifting   work activities   How long can you stand comfortably? Unsure (patient's wife reports 30 minutes)    How long can you walk comfortably? Unsure (patient's wife reports 30 minutes)    Patient Stated Goals return to work    Currently in Pain? No/denies                [facility] PT Assessment - 11/21/20 0001       Assessment   Medical Diagnosis Weakness secondary to tumor resection    Referring Provider (PT) Tsivian    Onset Date/Surgical Date 10/05/20    Next MD Visit 01/17/21    Prior Therapy Yes, acute care      Precautions   Precautions Sternal    Precaution Comments No lifting for 8 weeks after surgery      Restrictions   Weight Bearing Restrictions No      Balance Screen   Has the patient fallen in the past 6 months Yes    How many times? 2    Has the patient had a decrease in activity level because of a fear of falling?  No    Is the patient reluctant to leave their home because of a fear of falling?  No      Home Environment  Living Environment Private residence    Living Arrangements Spouse/significant other    Type of [place] to enter    Entrance Stairs-Number of Steps [place] One level      Prior Function   Level of Independence Independent    Vocation Full time employment    Vocation Requirements Lifting at least 15 pounds from shoulders to floor,stand for up to 12      Cognition   Overall Cognitive Status Within Functional Limits for tasks assessed    Attention Focused    Focused Attention Appears intact    Memory Appears intact    Awareness Appears intact    Problem Solving Appears intact      Sensation   Additional Comments Patient reports no numbness and tingling      ROM / Strength   AROM / PROM / Strength Strength      Strength   Strength Assessment Site Knee;Hip;Ankle    Right/Left Hip Right;Left    Right Hip Flexion  4/5   hip external rotation   Left Hip Flexion 4/5    Right/Left Knee Right;Left    Right Knee Flexion 4/5    Right Knee Extension 5/5    Left Knee Flexion 4+/5    Left Knee Extension 5/5    Right/Left Ankle Right;Left    Right Ankle Dorsiflexion 4+/5    Left Ankle Dorsiflexion 4/5      Transfers   Transfers Sit to Stand;Stand to Sit    Sit to Stand 6: Modified independent (Device/Increase time);Without upper extremity assist   Right hip externally rotated   Five time sit to stand comments  15 seconds    Stand to Sit 6: Modified independent (Device/Increase time);Without upper extremity assist      Ambulation/Gait   Ambulation/Gait Yes    Ambulation/Gait Assistance 7: Independent    Assistive device None    Gait Pattern Step-through pattern;Decreased stride length;Trunk flexed   Right hip externally rotated through gait cycle     Balance   Balance Assessed Yes      Static Standing Balance   Static Standing - Balance Support No upper extremity supported    Static Standing - Level of Assistance 5: Stand by assistance    Static Standing Balance -  Activities  Romberg - Eyes Opened;Romberg - Eyes Closed;Tandam Stance - Right Leg;Tandam Stance - Left Leg   Rhomberg: no limitations; Tandem: 14 seconds with RLE leading, 4 seconds with LLE leading                       Objective measurements completed on examination: See above findings.       [place] Adult PT Treatment/Exercise - 11/21/20 0001       Exercises   Exercises Knee/Hip      Knee/Hip Exercises: Aerobic   Nustep L3-4 x 10 minutes      Knee/Hip Exercises: Standing   Heel Raises Both;20 reps    Other Standing Knee Exercises Sidestepping   10 lap     Knee/Hip Exercises: Seated   Marching Both;20 reps   standing                         PT Long Term Goals - 11/21/20 1421       PT LONG TERM GOAL #1   Title Patient will be independent with  his HEP.    Time 6    Period Weeks     Status New    Target Date 01/02/21      PT LONG TERM GOAL #2   Title Patient will be able to stand and walk for at least 45 minutes without being limited by fatigue.    Baseline 30 minutes    Time 6    Period Weeks    Status New    Target Date 01/02/21      PT LONG TERM GOAL #3   Title Patient will be able to perform a 5 time sit to stand test in 12 seconds or less.    Baseline 15 seconds    Time 6    Period Weeks    Status New    Target Date 01/02/21      PT LONG TERM GOAL #4   Title Patient will be able to lift at least 15 pounds to shoulder level for improved function with his critical job demands.    Baseline unable    Time 6    Period Weeks    Status New    Target Date 01/02/21                    Plan - 11/21/20 1405     Clinical Impression Statement Patient is a 70 year old male presenting to physical therapy with generalized weakness following a tumor resection of the right kidney. He presented today with global lower extremity weakness without being limited by pain or discomfort . Recommend that he continue with his recommended plan of care to safely return to his prior level of function.    Personal Factors and Comorbidities Comorbidity 1;Comorbidity 2;Comorbidity 3+    Comorbidities Kidney cancer, HTN, and diabetes    Examination-Activity Limitations Locomotion Level;Carry;Lift    Examination-Participation Restrictions Occupation    Stability/Clinical Decision Making Evolving/Moderate complexity    Clinical Decision Making Moderate    Rehab Potential Good    PT Frequency 1x / week    PT Duration 6 weeks    PT Treatment/Interventions ADLs/Self Care Home Management;Gait training;Stair training;Functional mobility training;Therapeutic activities;Therapeutic exercise;Balance training;Neuromuscular re-education;Patient/family education;Energy conservation    PT Next Visit Plan Nustep/recumbent bke, lower extremity strengthening with core stabilization and  progression toward lifting being introduced after 11/14    PT Home Exercise Plan Marching at counter, heel raises, and ankle pumps (2 minutes each)    Consulted and Agree with Plan of Care Patient;Family member/caregiver    Family Member Consulted Wife             Patient will benefit from skilled therapeutic intervention in order to improve the following deficits and impairments:  Abnormal gait, Difficulty walking, Decreased activity tolerance, Decreased balance, Decreased strength, Postural dysfunction  Visit Diagnosis: Muscle weakness (generalized) - Plan: PT plan of care cert/re-cert  Other abnormalities of gait and mobility - Plan: PT plan of care cert/re-cert     Problem List Patient Active Problem List   Diagnosis Date Noted   Generalized anxiety disorder 11/07/2020   History of renal cell carcinoma 11/07/2020   Folate deficiency 11/07/2020   Acute DVT (deep venous thrombosis) ([place]) 06/01/2020   Anemia 07/14/2019   Positive colorectal cancer screening using Cologuard test 07/14/2019   Constipation 07/14/2019   Hyperlipidemia associated with type 2 diabetes mellitus ([place]) 08/26/2018   Hypertension associated with type 2 diabetes mellitus ([place]) 08/26/2018   Cigarette nicotine dependence without complication 02/40/9735   Primary osteoarthritis  01/03/2016   Allergic rhinitis 06/22/2014   Type 2 diabetes mellitus with hypoglycemia, with long-term current use of insulin ([place]) 09/15/2013   Alcohol abuse 11/19/2011   Fatty liver 11/19/2011    Darlin Coco, PT 11/21/2020, 2:37 PM  [facility] [place], [place], [address] Phone: [phone]   Fax:  [phone]  Name: George Anthony MRN: [id number] Date of Birth: 27-Sep-1950

## 2020-11-25 ENCOUNTER — Other Ambulatory Visit: Payer: Self-pay | Admitting: Family Medicine

## 2020-11-25 DIAGNOSIS — E1159 Type 2 diabetes mellitus with other circulatory complications: Secondary | ICD-10-CM

## 2020-11-25 DIAGNOSIS — I152 Hypertension secondary to endocrine disorders: Secondary | ICD-10-CM

## 2020-11-26 ENCOUNTER — Other Ambulatory Visit: Payer: Self-pay | Admitting: Family Medicine

## 2020-11-26 DIAGNOSIS — E1159 Type 2 diabetes mellitus with other circulatory complications: Secondary | ICD-10-CM

## 2020-11-26 DIAGNOSIS — I152 Hypertension secondary to endocrine disorders: Secondary | ICD-10-CM

## 2020-11-29 ENCOUNTER — Other Ambulatory Visit: Payer: Self-pay

## 2020-11-29 ENCOUNTER — Ambulatory Visit: Payer: BC Managed Care – PPO | Admitting: *Deleted

## 2020-11-29 DIAGNOSIS — M6281 Muscle weakness (generalized): Secondary | ICD-10-CM

## 2020-11-29 DIAGNOSIS — R2689 Other abnormalities of gait and mobility: Secondary | ICD-10-CM

## 2020-11-29 NOTE — Therapy (Signed)
[facility] [place], [place], [address] Phone: [phone]   Fax:  [phone]  Physical Therapy Treatment  Patient Details  Name: George Anthony MRN: [id number] Date of Birth: July 26, 1950 Referring Provider (PT): Tsivian   Encounter Date: 11/29/2020   PT End of Session - 11/29/20 0953     Visit Number 2    Number of Visits 6    Date for PT Re-Evaluation 01/18/21    PT Start Time 0900    PT Stop Time 6144    PT Time Calculation (min) 48 min             Past Medical History:  Diagnosis Date   Diabetes mellitus without complication ([place])    Enlarged heart    Hyperlipidemia    Hypertension     Past Surgical History:  Procedure Laterality Date   BIOPSY  09/16/2019   Procedure: BIOPSY;  Surgeon: Eloise Harman, DO;  [facility];  Service: Endoscopy;;   COLONOSCOPY WITH PROPOFOL N/A 09/16/2019   Procedure: COLONOSCOPY WITH PROPOFOL;  Surgeon: Eloise Harman, DO;  [facility];  Service: Endoscopy;  Laterality: N/A;  7:30am   POLYPECTOMY  09/16/2019   Procedure: POLYPECTOMY;  Surgeon: Eloise Harman, DO;  [facility];  Service: Endoscopy;;    There were no vitals filed for this visit.   Subjective Assessment - 11/29/20 0908     Subjective Covid -19 screening performed prior to entering building. Doin okay today    Pertinent History Kidney cancer, HTN, Diabetes    Limitations Walking;Other (comment);Lifting    How long can you stand comfortably? Unsure (patient's wife reports 30 minutes)    How long can you walk comfortably? Unsure (patient's wife reports 30 minutes)    Patient Stated Goals return to work    Currently in Pain? No/denies                               [facility] Adult PT Treatment/Exercise - 11/29/20 0001       Exercises   Exercises Knee/Hip      Knee/Hip Exercises: Aerobic   Nustep L5  x 15 minutes      Knee/Hip Exercises:  Standing   Heel Raises Both;3 sets;10 reps    Heel Raises Limitations toe raises 3x10    Hip Flexion Both;3 sets    Hip Abduction Both;2 sets;10 reps    Forward Step Up Both;3 sets;10 reps;Step Height: 6";Hand Hold: 2    SLS at counter x 5 each side      Knee/Hip Exercises: Seated   Sit to Sand 10 reps;with UE support                          PT Long Term Goals - 11/21/20 1421       PT LONG TERM GOAL #1   Title Patient will be independent with his HEP.    Time 6    Period Weeks    Status New    Target Date 01/02/21      PT LONG TERM GOAL #2   Title Patient will be able to stand and walk for at least 45 minutes without being limited by fatigue.    Baseline 30 minutes    Time 6    Period Weeks    Status New    Target Date 01/02/21  PT LONG TERM GOAL #3   Title Patient will be able to perform a 5 time sit to stand test in 12 seconds or less.    Baseline 15 seconds    Time 6    Period Weeks    Status New    Target Date 01/02/21      PT LONG TERM GOAL #4   Title Patient will be able to lift at least 15 pounds to shoulder level for improved function with his critical job demands.    Baseline unable    Time 6    Period Weeks    Status New    Target Date 01/02/21                   Plan - 11/29/20 0909     Clinical Impression Statement Pt arrived today doing fairly well with energy level and was able to complete sitting and standing exs today. SLS was fairly challenging and needed UE assistance or toe touch from other LE. HEP handouts given for 2x daily    Personal Factors and Comorbidities Comorbidity 1;Comorbidity 2;Comorbidity 3+    Comorbidities Kidney cancer, HTN, and diabetes    Examination-Activity Limitations Locomotion Level;Carry;Lift    Stability/Clinical Decision Making Evolving/Moderate complexity    Rehab Potential Good    PT Frequency 1x / week    PT Treatment/Interventions ADLs/Self Care Home Management;Gait training;Stair  training;Functional mobility training;Therapeutic activities;Therapeutic exercise;Balance training;Neuromuscular re-education;Patient/family education;Energy conservation    PT Next Visit Plan Nustep/recumbent bke, lower extremity strengthening with core stabilization and progression toward lifting being introduced after 11/14    PT Home Exercise Plan Marching at counter, heel raises, and ankle pumps (2 minutes each)    Consulted and Agree with Plan of Care Patient;Family member/caregiver             Patient will benefit from skilled therapeutic intervention in order to improve the following deficits and impairments:  Abnormal gait, Difficulty walking, Decreased activity tolerance, Decreased balance, Decreased strength, Postural dysfunction  Visit Diagnosis: Muscle weakness (generalized)  Other abnormalities of gait and mobility     Problem List Patient Active Problem List   Diagnosis Date Noted   Generalized anxiety disorder 11/07/2020   History of renal cell carcinoma 11/07/2020   Folate deficiency 11/07/2020   Acute DVT (deep venous thrombosis) ([place]) 06/01/2020   Anemia 07/14/2019   Positive colorectal cancer screening using Cologuard test 07/14/2019   Constipation 07/14/2019   Hyperlipidemia associated with type 2 diabetes mellitus ([place]) 08/26/2018   Hypertension associated with type 2 diabetes mellitus ([place]) 08/26/2018   Cigarette nicotine dependence without complication 11/13/8525   Primary osteoarthritis 01/03/2016   Allergic rhinitis 06/22/2014   Type 2 diabetes mellitus with hypoglycemia, with long-term current use of insulin ([place]) 09/15/2013   Alcohol abuse 11/19/2011   Fatty liver 11/19/2011    George Anthony,George Anthony, PTA 11/29/2020, 11:12 AM  [facility] [address] [place], [place], [address] Phone: [phone]   Fax:  [phone]  Name: George Anthony MRN: [id number] Date of Birth: February 17, 1950

## 2020-12-05 ENCOUNTER — Other Ambulatory Visit: Payer: Self-pay

## 2020-12-05 ENCOUNTER — Ambulatory Visit: Payer: BC Managed Care – PPO

## 2020-12-05 DIAGNOSIS — M6281 Muscle weakness (generalized): Secondary | ICD-10-CM

## 2020-12-05 DIAGNOSIS — R2689 Other abnormalities of gait and mobility: Secondary | ICD-10-CM

## 2020-12-05 NOTE — Therapy (Signed)
[facility] [place], [place], [address] Phone: [phone]   Fax:  [phone]  Physical Therapy Treatment  Patient Details  Name: George Anthony MRN: [id number] Date of Birth: Jan 25, 1950 Referring Provider (PT): Tsivian   Encounter Date: 12/05/2020   PT End of Session - 12/05/20 0915     Visit Number 3    Number of Visits 6    Date for PT Re-Evaluation 01/18/21    PT Start Time 0900    PT Stop Time 8242    PT Time Calculation (min) 43 min    Activity Tolerance Patient tolerated treatment well    Behavior During Therapy [facility] for tasks assessed/performed             Past Medical History:  Diagnosis Date   Diabetes mellitus without complication ([place])    Enlarged heart    Hyperlipidemia    Hypertension     Past Surgical History:  Procedure Laterality Date   BIOPSY  09/16/2019   Procedure: BIOPSY;  Surgeon: Eloise Harman, DO;  [facility];  Service: Endoscopy;;   COLONOSCOPY WITH PROPOFOL N/A 09/16/2019   Procedure: COLONOSCOPY WITH PROPOFOL;  Surgeon: Eloise Harman, DO;  [facility];  Service: Endoscopy;  Laterality: N/A;  7:30am   POLYPECTOMY  09/16/2019   Procedure: POLYPECTOMY;  Surgeon: Eloise Harman, DO;  [facility];  Service: Endoscopy;;    There were no vitals filed for this visit.   Subjective Assessment - 12/05/20 0900     Subjective Covid -19 screening performed prior to entering building. Patient reports that he feels alright today. He notes that he was a little tired after his last appointment.    Pertinent History Kidney cancer, HTN, Diabetes    Limitations Walking;Other (comment);Lifting    How long can you stand comfortably? Unsure (patient's wife reports 30 minutes)    How long can you walk comfortably? Unsure (patient's wife reports 30 minutes)    Patient Stated Goals return to work    Currently in Pain? No/denies                                [facility] Adult PT Treatment/Exercise - 12/05/20 0001       Knee/Hip Exercises: Aerobic   Nustep L5 x 15 minutes      Knee/Hip Exercises: Standing   Hip Abduction Stengthening;Both;20 reps;Knee straight    Abduction Limitations Red t-band around ankles    Hip Extension Both;Knee bent   24 reps each; alternating   Forward Step Up Both;2 sets;10 reps;Hand Hold: 0;Step Height: 6"    Rocker Board 3 minutes      Knee/Hip Exercises: Seated   Long Arc Quad Strengthening;Both;20 reps;Weights    Long Arc Quad Weight 5 lbs.                          PT Long Term Goals - 11/21/20 1421       PT LONG TERM GOAL #1   Title Patient will be independent with his HEP.    Time 6    Period Weeks    Status New    Target Date 01/02/21      PT LONG TERM GOAL #2   Title Patient will be able to stand and walk for at least 45 minutes without being limited by fatigue.    Baseline  30 minutes    Time 6    Period Weeks    Status New    Target Date 01/02/21      PT LONG TERM GOAL #3   Title Patient will be able to perform a 5 time sit to stand test in 12 seconds or less.    Baseline 15 seconds    Time 6    Period Weeks    Status New    Target Date 01/02/21      PT LONG TERM GOAL #4   Title Patient will be able to lift at least 15 pounds to shoulder level for improved function with his critical job demands.    Baseline unable    Time 6    Period Weeks    Status New    Target Date 01/02/21                   Plan - 12/05/20 0915     Clinical Impression Statement Patient was progressed with multiple new interventions for improved lower extremity strength needed for functional activities. He required minimal cuing with resised side stepping for eccentric hip control to avoid dragging his foot. He required brief rest breaks throughout treatment, but he was then able to complete the next activity with good biomechanics. He reported feeling  tired upon the conclusion of treatment. He continues to require skilled physical therapy to return to his prior level of function.    Personal Factors and Comorbidities Comorbidity 1;Comorbidity 2;Comorbidity 3+    Comorbidities Kidney cancer, HTN, and diabetes    Examination-Activity Limitations Locomotion Level;Carry;Lift    Stability/Clinical Decision Making Evolving/Moderate complexity    Rehab Potential Good    PT Frequency 1x / week    PT Treatment/Interventions ADLs/Self Care Home Management;Gait training;Stair training;Functional mobility training;Therapeutic activities;Therapeutic exercise;Balance training;Neuromuscular re-education;Patient/family education;Energy conservation    PT Next Visit Plan Nustep/recumbent bke, lower extremity strengthening with core stabilization and progression toward lifting being introduced after 11/14    PT Home Exercise Plan Marching at counter, heel raises, and ankle pumps (2 minutes each)    Consulted and Agree with Plan of Care Patient;Family member/caregiver             Patient will benefit from skilled therapeutic intervention in order to improve the following deficits and impairments:  Abnormal gait, Difficulty walking, Decreased activity tolerance, Decreased balance, Decreased strength, Postural dysfunction  Visit Diagnosis: Muscle weakness (generalized)  Other abnormalities of gait and mobility     Problem List Patient Active Problem List   Diagnosis Date Noted   Generalized anxiety disorder 11/07/2020   History of renal cell carcinoma 11/07/2020   Folate deficiency 11/07/2020   Acute DVT (deep venous thrombosis) ([place]) 06/01/2020   Anemia 07/14/2019   Positive colorectal cancer screening using Cologuard test 07/14/2019   Constipation 07/14/2019   Hyperlipidemia associated with type 2 diabetes mellitus ([place]) 08/26/2018   Hypertension associated with type 2 diabetes mellitus ([place]) 08/26/2018   Cigarette nicotine dependence without  complication 24/26/8341   Primary osteoarthritis 01/03/2016   Allergic rhinitis 06/22/2014   Type 2 diabetes mellitus with hypoglycemia, with long-term current use of insulin ([place]) 09/15/2013   Alcohol abuse 11/19/2011   Fatty liver 11/19/2011    Darlin Coco, PT 12/05/2020, 11:28 AM  [facility] [address] [place], [place], [address] Phone: [phone]   Fax:  [phone]  Name: George Anthony MRN: [id number] Date of Birth: Jul 04, 1950

## 2020-12-11 ENCOUNTER — Ambulatory Visit: Payer: BC Managed Care – PPO

## 2020-12-11 ENCOUNTER — Other Ambulatory Visit: Payer: Self-pay

## 2020-12-11 DIAGNOSIS — M6281 Muscle weakness (generalized): Secondary | ICD-10-CM | POA: Diagnosis not present

## 2020-12-11 DIAGNOSIS — R2689 Other abnormalities of gait and mobility: Secondary | ICD-10-CM

## 2020-12-11 NOTE — Therapy (Signed)
[facility] [place], [place], [address] Phone: [phone]   Fax:  [phone]  Physical Therapy Treatment  Patient Details  Name: George Anthony MRN: [id number] Date of Birth: 1950-04-05 Referring Provider (PT): Tsivian   Encounter Date: 12/11/2020   PT End of Session - 12/11/20 0851     Visit Number 4    Number of Visits 6    Date for PT Re-Evaluation 01/18/21    PT Start Time 0900    PT Stop Time 0945    PT Time Calculation (min) 45 min    Activity Tolerance Patient tolerated treatment well    Behavior During Therapy [facility] for tasks assessed/performed             Past Medical History:  Diagnosis Date   Diabetes mellitus without complication ([place])    Enlarged heart    Hyperlipidemia    Hypertension     Past Surgical History:  Procedure Laterality Date   BIOPSY  09/16/2019   Procedure: BIOPSY;  Surgeon: Eloise Harman, DO;  [facility];  Service: Endoscopy;;   COLONOSCOPY WITH PROPOFOL N/A 09/16/2019   Procedure: COLONOSCOPY WITH PROPOFOL;  Surgeon: Eloise Harman, DO;  [facility];  Service: Endoscopy;  Laterality: N/A;  7:30am   POLYPECTOMY  09/16/2019   Procedure: POLYPECTOMY;  Surgeon: Eloise Harman, DO;  [facility];  Service: Endoscopy;;    There were no vitals filed for this visit.   Subjective Assessment - 12/11/20 0851     Subjective Covid -19 screening performed prior to entering building. Patient reports that he is feeling great today. He notes that he feels like his HEP is helping.    Pertinent History Kidney cancer, HTN, Diabetes    Limitations Walking;Other (comment);Lifting    How long can you stand comfortably? Unsure (patient's wife reports 30 minutes)    How long can you walk comfortably? Unsure (patient's wife reports 30 minutes)    Patient Stated Goals return to work    Currently in Pain? 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    Time 6    Period Weeks    Status New    Target  Date 01/02/21      PT LONG TERM GOAL #2   Title Patient will be able to stand and walk for at least 45 minutes without being limited by fatigue.    Baseline 30 minutes    Time 6    Period Weeks    Status New    Target Date 01/02/21      PT LONG TERM GOAL #3   Title Patient will be able to perform a 5 time sit to stand test in 12 seconds or less.    Baseline 15 seconds    Time 6    Period Weeks    Status New    Target Date 01/02/21      PT LONG TERM GOAL #4   Title Patient will be able to lift at least 15 pounds to shoulder level for improved function with his critical job demands.    Baseline unable    Time 6    Period Weeks    Status New    Target Date 01/02/21                   Plan - 12/11/20 5374     Clinical Impression Statement Patient was progressed with multiple new interventions for improved lower extremity strength and stability with moderate difficulty and fatigue. He required minimal cuing with the rocker board to facilitate improved trunk mobility to isolate  ankle mobility. Fatigue was his limitation as he required seated rest breaks after today's standing interventions.  He reported feeling goodupon the conclusion of treatment. He continues to require skilled physical therapy to address his remaining impairments to return to his prior level of function.    Personal Factors and Comorbidities Comorbidity 1;Comorbidity 2;Comorbidity 3+    Comorbidities Kidney cancer, HTN, and diabetes    Examination-Activity Limitations Locomotion Level;Carry;Lift    Stability/Clinical Decision Making Evolving/Moderate complexity    Rehab Potential Good    PT Frequency 1x / week    PT Treatment/Interventions ADLs/Self Care Home Management;Gait training;Stair training;Functional mobility training;Therapeutic activities;Therapeutic exercise;Balance training;Neuromuscular re-education;Patient/family education;Energy conservation    PT Next Visit Plan Nustep/recumbent bke, lower extremity strengthening with core stabilization and progression toward lifting being introduced after 11/14    PT Home Exercise Plan Marching at counter, heel raises, and ankle pumps (2 minutes each)    Consulted and Agree with Plan of Care Patient             Patient will benefit from skilled therapeutic intervention in order to improve the following deficits and impairments:  Abnormal gait, Difficulty walking, Decreased activity tolerance, Decreased balance, Decreased strength, Postural dysfunction  Visit Diagnosis: Muscle weakness (generalized)  Other abnormalities of gait and mobility     Problem List Patient Active Problem List   Diagnosis Date Noted   Generalized anxiety disorder 11/07/2020   History of renal cell carcinoma 11/07/2020   Folate deficiency 11/07/2020   Acute DVT (deep venous thrombosis) ([place]) 06/01/2020   Anemia 07/14/2019   Positive colorectal cancer screening using Cologuard test 07/14/2019   Constipation 07/14/2019   Hyperlipidemia associated with type 2  diabetes mellitus ([place]) 08/26/2018   Hypertension associated with type 2 diabetes mellitus ([place]) 08/26/2018   Cigarette nicotine dependence without complication 82/70/7867   Primary osteoarthritis 01/03/2016   Allergic rhinitis 06/22/2014   Type 2 diabetes mellitus with hypoglycemia, with long-term current use of insulin ([place]) 09/15/2013   Alcohol abuse 11/19/2011   Fatty liver 11/19/2011    Darlin Coco, PT 12/11/2020, 10:43 AM  [facility] [place], [place], [address] Phone: [phone]   Fax:  [phone]  Name: George Anthony MRN: [id number] Date of Birth: 13-Nov-1950

## 2020-12-19 ENCOUNTER — Other Ambulatory Visit: Payer: Self-pay

## 2020-12-19 ENCOUNTER — Ambulatory Visit: Payer: BC Managed Care – PPO

## 2020-12-19 DIAGNOSIS — M6281 Muscle weakness (generalized): Secondary | ICD-10-CM | POA: Diagnosis not present

## 2020-12-19 DIAGNOSIS — R2689 Other abnormalities of gait and mobility: Secondary | ICD-10-CM

## 2020-12-19 NOTE — Therapy (Signed)
[facility] [place], [place], [address] Phone: [phone]   Fax:  [phone]  Physical Therapy Treatment  Patient Details  Name: George Anthony MRN: [id number] Date of Birth: 1950/08/19 Referring Provider (PT): Tsivian   Encounter Date: 12/19/2020   PT End of Session - 12/19/20 0904     Visit Number 5    Number of Visits 6    Date for PT Re-Evaluation 01/18/21    PT Start Time 0901    PT Stop Time 0945    PT Time Calculation (min) 44 min    Activity Tolerance Patient tolerated treatment well    Behavior During Therapy [facility] for tasks assessed/performed             Past Medical History:  Diagnosis Date   Diabetes mellitus without complication ([place])    Enlarged heart    Hyperlipidemia    Hypertension     Past Surgical History:  Procedure Laterality Date   BIOPSY  09/16/2019   Procedure: BIOPSY;  Surgeon: Eloise Harman, DO;  [facility];  Service: Endoscopy;;   COLONOSCOPY WITH PROPOFOL N/A 09/16/2019   Procedure: COLONOSCOPY WITH PROPOFOL;  Surgeon: Eloise Harman, DO;  [facility];  Service: Endoscopy;  Laterality: N/A;  7:30am   POLYPECTOMY  09/16/2019   Procedure: POLYPECTOMY;  Surgeon: Eloise Harman, DO;  [facility];  Service: Endoscopy;;    There were no vitals filed for this visit.   Subjective Assessment - 12/19/20 0903     Subjective Covid -19 screening performed prior to entering building. Patient reports that he feels good today. He notes that he gets a little sore from his HEP.    Pertinent History Kidney cancer, HTN, Diabetes    Limitations Walking;Other (comment);Lifting    How long can you stand comfortably? Unsure (patient's wife reports 30 minutes)    How long can you walk comfortably? Unsure (patient's wife reports 30 minutes)    Patient Stated Goals return to work    Currently in Pain? No/denies                                [facility] Adult PT Treatment/Exercise - 12/19/20 0001       Exercises   Exercises Lumbar      Lumbar Exercises: Standing   Shoulder Extension Strengthening;Both;Theraband   2 minutes   Shoulder Extension Limitations Green XTS    Other Standing Lumbar Exercises Press out   with side step and shoulder flexion; red t-band; 20 reps each     Knee/Hip Exercises: Aerobic   Recumbent Bike L5 x 15 minutes      Knee/Hip Exercises: Machines for Strengthening   Cybex Knee Extension 20 lbs x 2 minutes    Cybex Knee Flexion 20 lbs x 2 minutes                          PT Long Term Goals - 11/21/20 1421       PT LONG TERM GOAL #1   Title Patient will be independent with his HEP.    Time 6    Period Weeks    Status New    Target Date 01/02/21      PT LONG TERM GOAL #2   Title Patient will be able to stand and walk for at least 45 minutes without being limited  by fatigue.    Baseline 30 minutes    Time 6    Period Weeks    Status New    Target Date 01/02/21      PT LONG TERM GOAL #3   Title Patient will be able to perform a 5 time sit to stand test in 12 seconds or less.    Baseline 15 seconds    Time 6    Period Weeks    Status New    Target Date 01/02/21      PT LONG TERM GOAL #4   Title Patient will be able to lift at least 15 pounds to shoulder level for improved function with his critical job demands.    Baseline unable    Time 6    Period Weeks    Status New    Target Date 01/02/21                   Plan - 12/19/20 0935     Clinical Impression Statement Patient was progressed with multiple new and familiar interventions for improved strength and endurance needed for functional activities. Fatigue was his primary limiting factor with today's interventions with resisted knee extension and activities involving his upper extremities being the most difficult. He reported feeling tired upon the conclusion of  treatment. He continues to require skilled physical therapy to address his remaining impairments to return to his prior level of function.    Personal Factors and Comorbidities Comorbidity 1;Comorbidity 2;Comorbidity 3+    Comorbidities Kidney cancer, HTN, and diabetes    Examination-Activity Limitations Locomotion Level;Carry;Lift    Stability/Clinical Decision Making Evolving/Moderate complexity    Rehab Potential Good    PT Frequency 1x / week    PT Treatment/Interventions ADLs/Self Care Home Management;Gait training;Stair training;Functional mobility training;Therapeutic activities;Therapeutic exercise;Balance training;Neuromuscular re-education;Patient/family education;Energy conservation    PT Next Visit Plan Nustep/recumbent bke, lower extremity strengthening with core stabilization and progression toward lifting being introduced after 11/14    PT Home Exercise Plan Marching at counter, heel raises, and ankle pumps (2 minutes each)    Consulted and Agree with Plan of Care Patient             Patient will benefit from skilled therapeutic intervention in order to improve the following deficits and impairments:  Abnormal gait, Difficulty walking, Decreased activity tolerance, Decreased balance, Decreased strength, Postural dysfunction  Visit Diagnosis: Muscle weakness (generalized)  Other abnormalities of gait and mobility     Problem List Patient Active Problem List   Diagnosis Date Noted   Generalized anxiety disorder 11/07/2020   History of renal cell carcinoma 11/07/2020   Folate deficiency 11/07/2020   Acute DVT (deep venous thrombosis) ([place]) 06/01/2020   Anemia 07/14/2019   Positive colorectal cancer screening using Cologuard test 07/14/2019   Constipation 07/14/2019   Hyperlipidemia associated with type 2 diabetes mellitus ([place]) 08/26/2018   Hypertension associated with type 2 diabetes mellitus ([place]) 08/26/2018   Cigarette nicotine dependence without complication  16/38/4665   Primary osteoarthritis 01/03/2016   Allergic rhinitis 06/22/2014   Type 2 diabetes mellitus with hypoglycemia, with long-term current use of insulin ([place]) 09/15/2013   Alcohol abuse 11/19/2011   Fatty liver 11/19/2011    Darlin Coco, PT 12/19/2020, 3:32 PM  [facility] [address] [place], [place], [address] Phone: [phone]   Fax:  [phone]  Name: George Anthony MRN: [id number] Date of Birth: 17-Apr-1950

## 2020-12-27 ENCOUNTER — Other Ambulatory Visit: Payer: Self-pay

## 2020-12-27 ENCOUNTER — Ambulatory Visit: Payer: BC Managed Care – PPO | Attending: Urology | Admitting: *Deleted

## 2020-12-27 DIAGNOSIS — R2689 Other abnormalities of gait and mobility: Secondary | ICD-10-CM | POA: Diagnosis present

## 2020-12-27 DIAGNOSIS — M6281 Muscle weakness (generalized): Secondary | ICD-10-CM | POA: Diagnosis not present

## 2020-12-27 NOTE — Therapy (Signed)
[facility] [place], [place], [address] Phone: [phone]   Fax:  [phone]  Physical Therapy Treatment  Patient Details  Name: George Anthony MRN: [id number] Date of Birth: [birth year] Referring Provider (PT): Tsivian   Encounter Date: 12/27/2020   PT End of Session - 12/27/20 1425     Visit Number 6    Number of Visits 6    Date for PT Re-Evaluation 01/18/21    PT Start Time 0900    PT Stop Time 0946    PT Time Calculation (min) 46 min             Past Medical History:  Diagnosis Date   Diabetes mellitus without complication ([place])    Enlarged heart    Hyperlipidemia    Hypertension     Past Surgical History:  Procedure Laterality Date   BIOPSY  09/16/2019   Procedure: BIOPSY;  Surgeon: Eloise Harman, DO;  [facility];  Service: Endoscopy;;   COLONOSCOPY WITH PROPOFOL N/A 09/16/2019   Procedure: COLONOSCOPY WITH PROPOFOL;  Surgeon: Eloise Harman, DO;  [facility];  Service: Endoscopy;  Laterality: N/A;  7:30am   POLYPECTOMY  09/16/2019   Procedure: POLYPECTOMY;  Surgeon: Eloise Harman, DO;  [facility];  Service: Endoscopy;;    There were no vitals filed for this visit.   Subjective Assessment - 12/27/20 0915     Subjective Covid -19 screening performed prior to entering building. Patient reports that he feels good today. Feel RReady to go back to work    Pertinent History Kidney cancer, HTN, Diabetes    How long can you stand comfortably? Unsure (patient's wife reports 30 minutes)    How long can you walk comfortably? Unsure (patient's wife reports 30 minutes)    Patient Stated Goals return to work    Currently in Pain? No/denies                               [facility] Adult PT Treatment/Exercise - 12/27/20 0001       Exercises   Exercises Lumbar      Lumbar Exercises: Standing   Other Standing Lumbar Exercises 10# press at  wall to shldr level 3x10      Knee/Hip Exercises: Aerobic   Recumbent Bike L5 x 15 minutes      Knee/Hip Exercises: Standing   Other Standing Knee Exercises Farmer's carry with 7# wt's. Pt to use 1 gal water jugs at home      Knee/Hip Exercises: Seated   Sit to Sand 3 sets;5 reps;with UE support   timed 14,13,12 secs. x10 for HEP 3x10 2x daily                         PT Long Term Goals - 12/27/20 0919       PT LONG TERM GOAL #1   Title Patient will be independent with his HEP.    Time 6    Period Weeks    Status Achieved      PT LONG TERM GOAL #2   Title Patient will be able to stand and walk for at least 45 minutes without being limited by fatigue.    Baseline 30 minutes    Time 6    Period Weeks    Status Partially Met      PT LONG TERM GOAL #  3   Title Patient will be able to perform a 5 time sit to stand test in 12 seconds or less.    Baseline 15 seconds    Time 6    Period Weeks    Status Achieved    Target Date 01/02/21      PT LONG TERM GOAL #4   Title Patient will be able to lift at least 15 pounds to shoulder level for improved function with his critical job demands.    Baseline unable    Time 6    Period Weeks    Status Partially Met   10#s                  Plan - 12/27/20 0916     Clinical Impression Statement Pt arrived today doing better and feels he is ready to go back to work. He was able to perform therex in clinic with some fatigue , but did well . He was able to meet some LTGs, but not walking for 45 mins due to not attempting it at this time. Pt wants to be DC from PT at this time and RTW.    Personal Factors and Comorbidities Comorbidity 1;Comorbidity 2;Comorbidity 3+    Comorbidities Kidney cancer, HTN, and diabetes    Examination-Activity Limitations Locomotion Level;Carry;Lift    Examination-Participation Restrictions Occupation    Stability/Clinical Decision Making Evolving/Moderate complexity    Rehab Potential  Good    PT Frequency 1x / week    PT Duration 6 weeks    PT Treatment/Interventions ADLs/Self Care Home Management;Gait training;Stair training;Functional mobility training;Therapeutic activities;Therapeutic exercise;Balance training;Neuromuscular re-education;Patient/family education;Energy conservation    PT Next Visit Plan DC to HEP    PT Home Exercise Plan Marching at counter, heel raises, and ankle pumps (2 minutes each)             Patient will benefit from skilled therapeutic intervention in order to improve the following deficits and impairments:  Abnormal gait, Difficulty walking, Decreased activity tolerance, Decreased balance, Decreased strength, Postural dysfunction  Visit Diagnosis: Muscle weakness (generalized)  Other abnormalities of gait and mobility     Problem List Patient Active Problem List   Diagnosis Date Noted   Generalized anxiety disorder 11/07/2020   History of renal cell carcinoma 11/07/2020   Folate deficiency 11/07/2020   Acute DVT (deep venous thrombosis) ([place]) 06/01/2020   Anemia 07/14/2019   Positive colorectal cancer screening using Cologuard test 07/14/2019   Constipation 07/14/2019   Hyperlipidemia associated with type 2 diabetes mellitus ([place]) 08/26/2018   Hypertension associated with type 2 diabetes mellitus ([place]) 08/26/2018   Cigarette nicotine dependence without complication 35/45/6256   Primary osteoarthritis 01/03/2016   Allergic rhinitis 06/22/2014   Type 2 diabetes mellitus with hypoglycemia, with long-term current use of insulin ([place]) 09/15/2013   Alcohol abuse 11/19/2011   Fatty liver 11/19/2011    Carrisa Keller,CHRIS, PTA 12/27/2020, 2:40 PM  [facility] [address] [place], [place], [address] Phone: [phone]   Fax:  [phone]  Name: George Anthony MRN: [id number] Date of Birth: 09/03/1950  PHYSICAL THERAPY DISCHARGE SUMMARY  Visits from Start of Care: 6  Current  functional level related to goals / functional outcomes: Patient was able to meet most of his goals for physical therapy and reported feeling comfortable returning to work on 12/31/20. His primary limitation continues to be fatigue, but he was provided an updated HEP to continue to improve his functional strength and endurance.  Remaining deficits: Prolonged walking (has not attempted walking more than 45 minutes) and lifting overhead (10 pounds maximum)    Education / Equipment: HEP   Patient agrees to discharge. Patient goals were partially met. Patient is being discharged due to being pleased with the current functional level.    Jacqulynn Cadet, PT, DPT

## 2021-01-04 ENCOUNTER — Ambulatory Visit: Payer: BC Managed Care – PPO | Admitting: Family Medicine

## 2021-01-08 ENCOUNTER — Ambulatory Visit: Payer: BC Managed Care – PPO | Admitting: Family Medicine

## 2021-02-07 ENCOUNTER — Ambulatory Visit: Payer: BC Managed Care – PPO | Admitting: Family Medicine

## 2021-02-21 ENCOUNTER — Encounter: Payer: Self-pay | Admitting: Family Medicine

## 2021-02-21 ENCOUNTER — Ambulatory Visit (INDEPENDENT_AMBULATORY_CARE_PROVIDER_SITE_OTHER): Payer: Medicare Other | Admitting: Family Medicine

## 2021-02-21 VITALS — BP 143/77 | HR 80 | Temp 98.2°F | Ht 67.0 in | Wt 212.8 lb

## 2021-02-21 DIAGNOSIS — D509 Iron deficiency anemia, unspecified: Secondary | ICD-10-CM | POA: Diagnosis not present

## 2021-02-21 DIAGNOSIS — E1121 Type 2 diabetes mellitus with diabetic nephropathy: Secondary | ICD-10-CM

## 2021-02-21 DIAGNOSIS — E785 Hyperlipidemia, unspecified: Secondary | ICD-10-CM

## 2021-02-21 DIAGNOSIS — E11649 Type 2 diabetes mellitus with hypoglycemia without coma: Secondary | ICD-10-CM

## 2021-02-21 DIAGNOSIS — F411 Generalized anxiety disorder: Secondary | ICD-10-CM

## 2021-02-21 DIAGNOSIS — E1159 Type 2 diabetes mellitus with other circulatory complications: Secondary | ICD-10-CM | POA: Diagnosis not present

## 2021-02-21 DIAGNOSIS — Z85528 Personal history of other malignant neoplasm of kidney: Secondary | ICD-10-CM

## 2021-02-21 DIAGNOSIS — I152 Hypertension secondary to endocrine disorders: Secondary | ICD-10-CM | POA: Diagnosis not present

## 2021-02-21 DIAGNOSIS — E1169 Type 2 diabetes mellitus with other specified complication: Secondary | ICD-10-CM | POA: Diagnosis not present

## 2021-02-21 DIAGNOSIS — Z86718 Personal history of other venous thrombosis and embolism: Secondary | ICD-10-CM | POA: Diagnosis not present

## 2021-02-21 DIAGNOSIS — Z794 Long term (current) use of insulin: Secondary | ICD-10-CM | POA: Diagnosis not present

## 2021-02-21 HISTORY — DX: Type 2 diabetes mellitus with diabetic nephropathy: E11.21

## 2021-02-21 LAB — LIPID PANEL

## 2021-02-21 LAB — BAYER DCA HB A1C WAIVED: HB A1C (BAYER DCA - WAIVED): 5.9 % — ABNORMAL HIGH (ref 4.8–5.6)

## 2021-02-21 MED ORDER — APIXABAN 5 MG PO TABS: 5.0000 mg | ORAL_TABLET | Freq: Two times a day (BID) | ORAL | 1 refills | Status: DC

## 2021-02-21 MED ORDER — LISINOPRIL 10 MG PO TABS: 10.0000 mg | ORAL_TABLET | Freq: Every day | ORAL | 2 refills | Status: DC

## 2021-02-21 NOTE — Progress Notes (Signed)
Assessment & Plan:  1. Type 2 diabetes mellitus with hypoglycemia without coma, with long-term current use of insulin (HCC) Lab Results  Component Value Date   HGBA1C 5.9 (H) 02/21/2021   HGBA1C 5.2 11/07/2020   HGBA1C <4.2 (L) 08/09/2020    - Diabetes is at goal of A1c < 7. - Medications:  Continue metformin 500 mg twice daily.  Discontinue glipizide. - Home glucose monitoring: Continue monitoring - Patient is currently taking a statin. Patient is taking an ACE-inhibitor/ARB.  - Instruction/counseling given: reminded to get eye exam  Diabetes Health Maintenance Due  Topic Date Due   OPHTHALMOLOGY EXAM  Never done   FOOT EXAM  03/22/2021   HEMOGLOBIN A1C  08/21/2021    Lab Results  Component Value Date   LABMICR 43.1 08/25/2019   LABMICR 44.9 08/26/2018   - Lipid panel - CBC with Differential/Platelet - CMP14+EGFR - Bayer DCA Hb A1c Waived - Microalbumin / creatinine urine ratio  2. Hypertension associated with type 2 diabetes mellitus (HCC) Uncontrolled.  Started patient on lisinopril 10 mg once daily.  Education provided on the DASH diet. - Lipid panel - CBC with Differential/Platelet - CMP14+EGFR - lisinopril (ZESTRIL) 10 MG tablet; Take 1 tablet (10 mg total) by mouth daily.  Dispense: 30 tablet; Refill: 2  3. Hyperlipidemia associated with type 2 diabetes mellitus ([place]) Well controlled on current regimen.  - Lipid panel - CMP14+EGFR  4. Personal history of DVT (deep vein thrombosis) Continue Eliquis. - apixaban (ELIQUIS) 5 MG TABS tablet; Take 1 tablet (5 mg total) by mouth 2 (two) times daily.  Dispense: 180 tablet; Refill: 1  5. Iron deficiency anemia, unspecified iron deficiency anemia type Continue ferrous sulfate. - CBC with Differential/Platelet  6. Generalized anxiety disorder Well controlled on current regimen.  - CMP14+EGFR  7. History of renal cell carcinoma Managed by urology who he is scheduled to follow-up with in March.   Return in  about 3 months (around 05/21/2021) for annual physical.  Hendricks Limes, MSN, APRN, FNP-C [facility]   Subjective:    Patient ID: George Anthony, male    DOB: 1950-07-08, 71 y.o.   MRN: [id number]  Patient Care Team: Loman Brooklyn, FNP as PCP - General (Family Medicine) Eloise Harman, DO as Consulting Physician (Internal Medicine) Harlen Labs, MD as Referring Physician (Optometry)   Chief Complaint:  Chief Complaint  Patient presents with   Medical Management of Chronic Issues    HPI: George Anthony is a 71 y.o. male presenting on 02/21/2021 for Medical Management of Chronic Issues  Diabetes: Current symptoms include: none. Known diabetic complications: none. Medication compliance: taking glipizide 5 mg daily and metformin 500 mg BID. Current diet: in general, an "unhealthy" diet. Current exercise: none. Home blood sugar records: BGs are running  consistent with Hgb A1C. Is he  on ACE inhibitor or angiotensin II receptor blocker? No. Is he on a statin? Yes (Atorvastatin).   Hypertension: patient does not check his blood pressure at home.   Anxiety: taking Lexapro and doing well with it.   Depression screen [facility] 2/9 11/07/2020 08/09/2020 03/22/2020  Decreased Interest 0 0 0  Down, Depressed, Hopeless 0 0 0  PHQ - 2 Score 0 0 0  Altered sleeping 1 0 -  Tired, decreased energy 2 2 -  Change in appetite 0 3 -  Feeling bad or failure about yourself  0 0 -  Trouble concentrating 0 0 -  Moving  slowly or fidgety/restless 0 0 -  Suicidal thoughts 0 0 -  PHQ-9 Score 3 5 -  Difficult doing work/chores Not difficult at all Very difficult -   GAD 7 : Generalized Anxiety Score 11/07/2020 08/09/2020  Nervous, Anxious, on Edge 0 0  Control/stop worrying 2 0  Worry too much - different things 0 0  Trouble relaxing 0 0  Restless 0 0  Easily annoyed or irritable 3 1  Afraid - awful might happen 0 0  Total GAD 7 Score 5 1  Anxiety Difficulty Not  difficult at all Not difficult at all   Anemia: taking ferrous sulfate once daily.   History of renal cell carcinoma: right nephrectomy with IVC thrombectomy on 09/04/2020. Patient is being followed by urology who he is seeing every 3 months for the first year for CT/MRI and blood work.   New Complaints: None   Social history:  Relevant past medical, surgical, family and social history reviewed and updated as indicated. Interim medical history since our last visit reviewed.  Allergies and medications reviewed and updated.  DATA REVIEWED: CHART IN EPIC  ROS: Negative unless specifically indicated above in HPI.    Current Outpatient Medications:    acetaminophen (TYLENOL) 500 MG tablet, Take 1,000 mg by mouth every 6 (six) hours as needed for moderate pain., Disp: , Rfl:    atorvastatin (LIPITOR) 40 MG tablet, Take 1 tablet (40 mg total) by mouth at bedtime., Disp: 90 tablet, Rfl: 1   B-D UF III MINI PEN NEEDLES 31G X 5 MM MISC, Use with insulin daily Dx E11.69, Disp: 100 each, Rfl: 3   escitalopram (LEXAPRO) 10 MG tablet, Take 1 tablet (10 mg total) by mouth daily., Disp: 90 tablet, Rfl: 1   ferrous sulfate 325 (65 FE) MG tablet, Take 1 tablet (325 mg total) by mouth daily with breakfast., Disp: 90 tablet, Rfl: 1   FOLIC ACID PO, Take by mouth., Disp: , Rfl:    glipiZIDE (GLUCOTROL) 5 MG tablet, Take 1 tablet (5 mg total) by mouth 2 (two) times daily before a meal., Disp: 180 tablet, Rfl: 1   glucose blood test strip, Check TID and PRN dx E11.9, Disp: 100 each, Rfl: 11   metFORMIN (GLUCOPHAGE) 500 MG tablet, Take 1 tablet (500 mg total) by mouth 2 (two) times daily with a meal., Disp: 180 tablet, Rfl: 1   apixaban (ELIQUIS) 5 MG TABS tablet, Take 1 tablet (5 mg total) by mouth 2 (two) times daily., Disp: 180 tablet, Rfl: 0   insulin glargine-yfgn (SEMGLEE, YFGN,) 100 UNIT/ML injection, Inject 0.15 mLs (15 Units total) into the skin daily. (Patient not taking: Reported on 02/21/2021),  Disp: 10 mL, Rfl: 1   No Known Allergies Past Medical History:  Diagnosis Date   Diabetes mellitus without complication ([place])    Enlarged heart    Hyperlipidemia    Hypertension     Past Surgical History:  Procedure Laterality Date   BIOPSY  09/16/2019   Procedure: BIOPSY;  Surgeon: Eloise Harman, DO;  [facility];  Service: Endoscopy;;   COLONOSCOPY WITH PROPOFOL N/A 09/16/2019   Procedure: COLONOSCOPY WITH PROPOFOL;  Surgeon: Eloise Harman, DO;  [facility];  Service: Endoscopy;  Laterality: N/A;  7:30am   POLYPECTOMY  09/16/2019   Procedure: POLYPECTOMY;  Surgeon: Eloise Harman, DO;  [facility];  Service: Endoscopy;;    Social History   Socioeconomic History   Marital status: Married  Spouse name: charlene   Number of children: 1   Years of education: Not on file   Highest education level: Not on file  Occupational History    Employer: [employer]  Tobacco Use   Smoking status: Every Day    Packs/day: 1.00    Years: 40.00    Pack years: 40.00    Types: Cigarettes   Smokeless tobacco: Never  Vaping Use   Vaping Use: Never used  Substance and Sexual Activity   Alcohol use: Yes    Alcohol/week: 3.0 standard drinks    Types: 3 Cans of beer per week    Comment: drinking a couple times a month for the last year.- used to drink 2 12 packs a week   Drug use: No   Sexual activity: Yes  Other Topics Concern   Not on file  Social History Narrative   Not on file   Social Determinants of Health   Financial Resource Strain: Not on file  Food Insecurity: Not on file  Transportation Needs: Not on file  Physical Activity: Not on file  Stress: Not on file  Social Connections: Not on file  Intimate Partner Violence: Not on file        Objective:    BP (!) 143/77    Pulse 80    Temp 98.2 F (36.8 C) (Temporal)    Ht 5' 7" (1.702 m)    Wt 212 lb 12.8 oz (96.5 kg)    SpO2 96%    BMI 33.33 kg/m   Wt Readings from  Last 3 Encounters:  02/21/21 212 lb 12.8 oz (96.5 kg)  11/07/20 172 lb 9.6 oz (78.3 kg)  08/10/20 174 lb 6.4 oz (79.1 kg)    Physical Exam Vitals reviewed.  Constitutional:      General: He is not in acute distress.    Appearance: Normal appearance. He is overweight. He is not ill-appearing, toxic-appearing or diaphoretic.  HENT:     Head: Normocephalic and atraumatic.  Eyes:     General: No scleral icterus.       Right eye: No discharge.        Left eye: No discharge.     Conjunctiva/sclera: Conjunctivae normal.  Cardiovascular:     Rate and Rhythm: Normal rate and regular rhythm.     Heart sounds: Normal heart sounds. No murmur heard.   No friction rub. No gallop.  Pulmonary:     Effort: Pulmonary effort is normal. No respiratory distress.     Breath sounds: Normal breath sounds. No stridor. No wheezing, rhonchi or rales.  Musculoskeletal:        General: Normal range of motion.     Cervical back: Normal range of motion.     Right lower leg: No edema.     Left lower leg: No edema.  Skin:    General: Skin is warm and dry.     Comments: Generalized dry, flaky skin  Neurological:     Mental Status: He is alert and oriented to person, place, and time. Mental status is at baseline.  Psychiatric:        Mood and Affect: Mood normal.        Behavior: Behavior normal.        Thought Content: Thought content normal.        Judgment: Judgment normal.    Lab Results  Component Value Date   TSH 2.700 11/07/2020   Lab Results  Component Value Date   WBC 10.4  11/07/2020   HGB 12.8 (L) 11/07/2020   HCT 39.2 11/07/2020   MCV 90 11/07/2020   PLT 174 11/07/2020   Lab Results  Component Value Date   NA 141 11/07/2020   K 4.6 11/07/2020   CO2 23 11/07/2020   GLUCOSE 78 11/07/2020   BUN 18 11/07/2020   CREATININE 0.87 11/07/2020   BILITOT 0.3 11/07/2020   ALKPHOS 105 11/07/2020   AST 10 11/07/2020   ALT 8 11/07/2020   PROT 6.6 11/07/2020   ALBUMIN 4.5 11/07/2020    CALCIUM 9.8 11/07/2020   ANIONGAP 8 08/10/2020   EGFR 93 11/07/2020   Lab Results  Component Value Date   CHOL 178 11/07/2020   Lab Results  Component Value Date   HDL 65 11/07/2020   Lab Results  Component Value Date   LDLCALC 87 11/07/2020   Lab Results  Component Value Date   TRIG 150 (H) 11/07/2020   Lab Results  Component Value Date   CHOLHDL 2.7 11/07/2020   Lab Results  Component Value Date   HGBA1C 5.2 11/07/2020

## 2021-02-21 NOTE — Patient Instructions (Addendum)
Please schedule your diabetic eye exam.   You may stop Glipizide.  Start Lisinopril for your blood pressure.

## 2021-02-22 ENCOUNTER — Encounter: Payer: Self-pay | Admitting: Family Medicine

## 2021-02-22 LAB — CMP14+EGFR
ALT: 18 IU/L (ref 0–44)
AST: 14 IU/L (ref 0–40)
Albumin/Globulin Ratio: 1.7 (ref 1.2–2.2)
Albumin: 4.3 g/dL (ref 3.8–4.8)
Alkaline Phosphatase: 84 IU/L (ref 44–121)
BUN/Creatinine Ratio: 18 (ref 10–24)
BUN: 19 mg/dL (ref 8–27)
Bilirubin Total: 0.5 mg/dL (ref 0.0–1.2)
CO2: 24 mmol/L (ref 20–29)
Calcium: 9.4 mg/dL (ref 8.6–10.2)
Chloride: 101 mmol/L (ref 96–106)
Creatinine, Ser: 1.08 mg/dL (ref 0.76–1.27)
Globulin, Total: 2.5 g/dL (ref 1.5–4.5)
Glucose: 131 mg/dL — ABNORMAL HIGH (ref 70–99)
Potassium: 4.2 mmol/L (ref 3.5–5.2)
Sodium: 140 mmol/L (ref 134–144)
Total Protein: 6.8 g/dL (ref 6.0–8.5)
eGFR: 74 mL/min/{1.73_m2} (ref >59)

## 2021-02-22 LAB — CBC WITH DIFFERENTIAL/PLATELET
Basophils Absolute: 0.1 10*3/uL (ref 0.0–0.2)
Basos: 0 %
EOS (ABSOLUTE): 0.3 10*3/uL (ref 0.0–0.4)
Eos: 2 %
Hematocrit: 41.2 % (ref 37.5–51.0)
Hemoglobin: 13.9 g/dL (ref 13.0–17.7)
Immature Grans (Abs): 0.1 10*3/uL (ref 0.0–0.1)
Immature Granulocytes: 1 %
Lymphocytes Absolute: 7.4 10*3/uL — ABNORMAL HIGH (ref 0.7–3.1)
Lymphs: 52 %
MCH: 31.7 pg (ref 26.6–33.0)
MCHC: 33.7 g/dL (ref 31.5–35.7)
MCV: 94 fL (ref 79–97)
Monocytes Absolute: 0.7 10*3/uL (ref 0.1–0.9)
Monocytes: 5 %
Neutrophils Absolute: 5.5 10*3/uL (ref 1.4–7.0)
Neutrophils: 40 %
Platelets: 177 10*3/uL (ref 150–450)
RBC: 4.38 x10E6/uL (ref 4.14–5.80)
RDW: 12.6 % (ref 11.6–15.4)
WBC: 14 10*3/uL — ABNORMAL HIGH (ref 3.4–10.8)

## 2021-02-22 LAB — LIPID PANEL
Chol/HDL Ratio: 2.2 ratio (ref 0.0–5.0)
Cholesterol, Total: 157 mg/dL (ref 100–199)
HDL: 70 mg/dL (ref >39)
LDL Chol Calc (NIH): 74 mg/dL (ref 0–99)
Triglycerides: 68 mg/dL (ref 0–149)
VLDL Cholesterol Cal: 13 mg/dL (ref 5–40)

## 2021-02-22 LAB — MICROALBUMIN / CREATININE URINE RATIO
Creatinine, Urine: 82.5 mg/dL
Microalb/Creat Ratio: 40 mg/g creat — ABNORMAL HIGH (ref 0–29)
Microalbumin, Urine: 32.8 ug/mL

## 2021-03-21 ENCOUNTER — Other Ambulatory Visit: Payer: Self-pay | Admitting: Family Medicine

## 2021-03-21 DIAGNOSIS — E1159 Type 2 diabetes mellitus with other circulatory complications: Secondary | ICD-10-CM

## 2021-03-21 DIAGNOSIS — I152 Hypertension secondary to endocrine disorders: Secondary | ICD-10-CM

## 2021-03-25 ENCOUNTER — Other Ambulatory Visit: Payer: Self-pay | Admitting: Family Medicine

## 2021-03-25 DIAGNOSIS — E1159 Type 2 diabetes mellitus with other circulatory complications: Secondary | ICD-10-CM

## 2021-04-11 DIAGNOSIS — C641 Malignant neoplasm of right kidney, except renal pelvis: Secondary | ICD-10-CM | POA: Diagnosis not present

## 2021-04-11 DIAGNOSIS — R911 Solitary pulmonary nodule: Secondary | ICD-10-CM | POA: Diagnosis not present

## 2021-04-11 DIAGNOSIS — Z85528 Personal history of other malignant neoplasm of kidney: Secondary | ICD-10-CM | POA: Diagnosis not present

## 2021-04-11 DIAGNOSIS — R918 Other nonspecific abnormal finding of lung field: Secondary | ICD-10-CM | POA: Diagnosis not present

## 2021-04-11 DIAGNOSIS — Z905 Acquired absence of kidney: Secondary | ICD-10-CM | POA: Diagnosis not present

## 2021-04-11 DIAGNOSIS — K7689 Other specified diseases of liver: Secondary | ICD-10-CM | POA: Diagnosis not present

## 2021-04-11 DIAGNOSIS — N183 Chronic kidney disease, stage 3 unspecified: Secondary | ICD-10-CM | POA: Diagnosis not present

## 2021-04-11 DIAGNOSIS — Z08 Encounter for follow-up examination after completed treatment for malignant neoplasm: Secondary | ICD-10-CM | POA: Diagnosis not present

## 2021-05-06 ENCOUNTER — Other Ambulatory Visit: Payer: Self-pay | Admitting: Family Medicine

## 2021-05-06 DIAGNOSIS — E1169 Type 2 diabetes mellitus with other specified complication: Secondary | ICD-10-CM

## 2021-05-06 DIAGNOSIS — F411 Generalized anxiety disorder: Secondary | ICD-10-CM

## 2021-05-18 ENCOUNTER — Other Ambulatory Visit: Payer: Self-pay | Admitting: Family Medicine

## 2021-05-18 DIAGNOSIS — E538 Deficiency of other specified B group vitamins: Secondary | ICD-10-CM

## 2021-05-23 ENCOUNTER — Ambulatory Visit: Payer: Medicare Other | Admitting: Family Medicine

## 2021-06-06 ENCOUNTER — Ambulatory Visit (INDEPENDENT_AMBULATORY_CARE_PROVIDER_SITE_OTHER): Payer: Medicare Other | Admitting: Family Medicine

## 2021-06-06 ENCOUNTER — Encounter: Payer: Self-pay | Admitting: Family Medicine

## 2021-06-06 VITALS — BP 133/80 | HR 88 | Temp 97.9°F | Ht 67.0 in | Wt 225.4 lb

## 2021-06-06 DIAGNOSIS — E1121 Type 2 diabetes mellitus with diabetic nephropathy: Secondary | ICD-10-CM | POA: Insufficient documentation

## 2021-06-06 DIAGNOSIS — E1169 Type 2 diabetes mellitus with other specified complication: Secondary | ICD-10-CM

## 2021-06-06 DIAGNOSIS — E785 Hyperlipidemia, unspecified: Secondary | ICD-10-CM | POA: Diagnosis not present

## 2021-06-06 DIAGNOSIS — E1159 Type 2 diabetes mellitus with other circulatory complications: Secondary | ICD-10-CM

## 2021-06-06 DIAGNOSIS — Z794 Long term (current) use of insulin: Secondary | ICD-10-CM

## 2021-06-06 DIAGNOSIS — I152 Hypertension secondary to endocrine disorders: Secondary | ICD-10-CM

## 2021-06-06 DIAGNOSIS — Z85528 Personal history of other malignant neoplasm of kidney: Secondary | ICD-10-CM | POA: Diagnosis not present

## 2021-06-06 DIAGNOSIS — Z86718 Personal history of other venous thrombosis and embolism: Secondary | ICD-10-CM | POA: Diagnosis not present

## 2021-06-06 DIAGNOSIS — E11649 Type 2 diabetes mellitus with hypoglycemia without coma: Secondary | ICD-10-CM

## 2021-06-06 DIAGNOSIS — D509 Iron deficiency anemia, unspecified: Secondary | ICD-10-CM | POA: Diagnosis not present

## 2021-06-06 DIAGNOSIS — F411 Generalized anxiety disorder: Secondary | ICD-10-CM

## 2021-06-06 LAB — BAYER DCA HB A1C WAIVED: HB A1C (BAYER DCA - WAIVED): 7 % — ABNORMAL HIGH (ref 4.8–5.6)

## 2021-06-06 MED ORDER — LISINOPRIL 10 MG PO TABS: 10.0000 mg | ORAL_TABLET | Freq: Every day | ORAL | 1 refills | Status: DC

## 2021-06-06 MED ORDER — METFORMIN HCL 500 MG PO TABS: ORAL_TABLET | ORAL | 1 refills | Status: DC

## 2021-06-06 NOTE — Progress Notes (Signed)
Assessment & Plan:  1. Type 2 diabetes mellitus with hypoglycemia without coma, with long-term current use of insulin (HCC) A1c 7.0 today which is increased from 5.9 three months ago.  - Diabetes is not at goal of A1c < 7. - Medications:  increase metformin to 1,000 mg in the AM and 500 mg in the PM - Home glucose monitoring: continue monitoring - Patient is currently taking a statin. Patient is taking an ACE-inhibitor/ARB.  - Instruction/counseling given: reminded to get eye exam, discussed foot care, discussed the need for weight loss, and discussed diet  Diabetes Health Maintenance Due  Topic Date Due   OPHTHALMOLOGY EXAM  Never done   HEMOGLOBIN A1C  08/21/2021   FOOT EXAM  06/07/2022    Lab Results  Component Value Date   LABMICR 32.8 02/21/2021   LABMICR 43.1 08/25/2019   - Lipid panel - CBC with Differential/Platelet - CMP14+EGFR - Bayer DCA Hb A1c Waived - Vitamin B12 - metFORMIN (GLUCOPHAGE) 500 MG tablet; Take 2 tablets (1,000 mg total) by mouth daily with breakfast AND 1 tablet (500 mg total) daily with supper.  Dispense: 270 tablet; Refill: 1  2. Diabetic nephropathy associated with type 2 diabetes mellitus ([place]) Working on better control of diabetes. Taking ACE-inhibitor. - CMP14+EGFR  3. Hypertension associated with type 2 diabetes mellitus ([place]) Well controlled on current regimen.  - Lipid panel - CBC with Differential/Platelet - CMP14+EGFR - lisinopril (ZESTRIL) 10 MG tablet; Take 1 tablet (10 mg total) by mouth daily.  Dispense: 90 tablet; Refill: 1  4. Hyperlipidemia associated with type 2 diabetes mellitus ([place]) Well controlled on current regimen.  - Lipid panel - CBC with Differential/Platelet - CMP14+EGFR  5. Iron deficiency anemia, unspecified iron deficiency anemia type D/C ferrous sulfate. - CBC with Differential/Platelet  6. Morbid obesity ([place]) Encouraged healthy eating and exercise. - Lipid panel - CBC with Differential/Platelet -  CMP14+EGFR  7. Generalized anxiety disorder Well controlled on current regimen.  - CMP14+EGFR  8. Personal history of DVT (deep vein thrombosis) Continue Eliquis. - CBC with Differential/Platelet  9. History of renal cell carcinoma Managed by urology.   Return in about 3 months (around 09/06/2021) for DM.  Hendricks Limes, MSN, APRN, FNP-C [facility]  Subjective:    Patient ID: George Anthony, male    DOB: [birth year], 71 y.o.   MRN: [id number]  Patient Care Team: Loman Brooklyn, FNP as PCP - General (Family Medicine) Eloise Harman, DO as Consulting Physician (Internal Medicine) Harlen Labs, MD as Referring Physician (Optometry)   Chief Complaint:  Chief Complaint  Patient presents with   Medical Management of Chronic Issues    HPI: George Anthony is a 71 y.o. male presenting on 06/06/2021 for Medical Management of Chronic Issues  Patient is accompanied by his wife, who he is okay with being present.  Diabetes: Current symptoms include: none. Known diabetic complications: nephropathy. Medication compliance: yes, taking metformin 500 mg BID. Current diet: in general, an "unhealthy" diet. Current exercise: none. Home blood sugar records: BGs are running  consistent with Hgb A1C. Is he  on ACE inhibitor or angiotensin II receptor blocker? Yes (Lisinopril). Is he on a statin? Yes (Atorvastatin).   Hypertension: patient does not check his blood pressure at home.   Anxiety: taking Lexapro and doing well with it.      06/06/2021    8:33 AM 02/21/2021    9:08 AM 11/07/2020    2:58 PM  Depression  screen PHQ 2/9  Decreased Interest 0 0 0  Down, Depressed, Hopeless 0 0 0  PHQ - 2 Score 0 0 0  Altered sleeping 0 1 1  Tired, decreased energy 0 1 2  Change in appetite 2 1 0  Feeling bad or failure about yourself  0 0 0  Trouble concentrating 0 0 0  Moving slowly or fidgety/restless 0 0 0  Suicidal thoughts 0 0 0  PHQ-9 Score _0 Difficult doing work/chores Not difficult at all Not difficult at all Not difficult at all      06/06/2021    8:34 AM 02/21/2021    9:08 AM 11/07/2020    2:59 PM 08/09/2020    8:40 AM  GAD 7 : Generalized Anxiety Score  Nervous, Anxious, on Edge 0 0 0 0  Control/stop worrying 0 1 2 0  Worry too much - different things 0 0 0 0  Trouble relaxing 0 0 0 0  Restless 0 0 0 0  Easily annoyed or irritable _1 Afraid - awful might happen 0 0 0 0  Total GAD 7 Score _2 Anxiety Difficulty Not difficult at all Not difficult at all Not difficult at all Not difficult at all   Anemia: taking ferrous sulfate once daily.   History of renal cell carcinoma: right nephrectomy with IVC thrombectomy on 09/04/2020. Patient is being followed by urology who he is seeing every 3 months for the first year for CT/MRI and blood work.   New Complaints: None   Social history:  Relevant past medical, surgical, family and social history reviewed and updated as indicated. Interim medical history since our last visit reviewed.  Allergies and medications reviewed and updated.  DATA REVIEWED: CHART IN EPIC  ROS: Negative unless specifically indicated above in HPI.    Current Outpatient Medications:    acetaminophen (TYLENOL) 500 MG tablet, Take 1,000 mg by mouth every 6 (six) hours as needed for moderate pain., Disp: , Rfl:    apixaban (ELIQUIS) 5 MG TABS tablet, Take 1 tablet (5 mg total) by mouth 2 (two) times daily., Disp: 180 tablet, Rfl: 1   atorvastatin (LIPITOR) 40 MG tablet, TAKE 1 TABLET BY MOUTH AT BEDTIME, Disp: 90 tablet, Rfl: 0   B-D UF III MINI PEN NEEDLES 31G X 5 MM MISC, Use with insulin daily Dx E11.69, Disp: 100 each, Rfl: 3   escitalopram (LEXAPRO) 10 MG tablet, Take 1 tablet by mouth once daily, Disp: 90 tablet, Rfl: 0   ferrous sulfate 325 (65 FE) MG tablet, Take 1 tablet (325 mg total) by mouth daily with breakfast., Disp: 90 tablet, Rfl: 1   glucose blood test strip, Check TID  and PRN dx E11.9, Disp: 100 each, Rfl: 11   lisinopril (ZESTRIL) 10 MG tablet, Take 1 tablet by mouth once daily, Disp: 90 tablet, Rfl: 0   metFORMIN (GLUCOPHAGE) 500 MG tablet, Take 1 tablet (500 mg total) by mouth 2 (two) times daily with a meal., Disp: 180 tablet, Rfl: 1   FOLIC ACID PO, Take by mouth. (Patient not taking: Reported on 06/06/2021), Disp: , Rfl:    No Known Allergies Past Medical History:  Diagnosis Date   Diabetes mellitus without complication ([place])    Enlarged heart    Hyperlipidemia    Hypertension     Past Surgical History:  Procedure Laterality Date   BIOPSY  09/16/2019   Procedure: BIOPSY;  Surgeon: Eloise Harman,  DO;  [facility];  Service: Endoscopy;;   COLONOSCOPY WITH PROPOFOL N/A 09/16/2019   Procedure: COLONOSCOPY WITH PROPOFOL;  Surgeon: Eloise Harman, DO;  [facility];  Service: Endoscopy;  Laterality: N/A;  7:30am   POLYPECTOMY  09/16/2019   Procedure: POLYPECTOMY;  Surgeon: Eloise Harman, DO;  [facility];  Service: Endoscopy;;    Social History   Socioeconomic History   Marital status: Married    Spouse name: charlene   Number of children: 1   Years of education: Not on file   Highest education level: Not on file  Occupational History    Employer: [employer]   Occupation: Retired  Tobacco Use   Smoking status: Every Day    Packs/day: 1.00    Years: 40.00    Pack years: 40.00    Types: Cigarettes   Smokeless tobacco: Never  Vaping Use   Vaping Use: Never used  Substance and Sexual Activity   Alcohol use: Yes    Alcohol/week: 3.0 standard drinks    Types: 3 Cans of beer per week    Comment: drinking a couple times a month for the last year.- used to drink 2 12 packs a week   Drug use: No   Sexual activity: Yes  Other Topics Concern   Not on file  Social History Narrative   Not on file   Social Determinants of Health   Financial Resource Strain: Not on file  Food Insecurity: Not  on file  Transportation Needs: Not on file  Physical Activity: Not on file  Stress: Not on file  Social Connections: Not on file  Intimate Partner Violence: Not on file        Objective:    BP 133/80   Pulse 88   Temp 97.9 F (36.6 C) (Temporal)   Ht $R'5\' 7"'BY$  (1.702 m)   Wt 225 lb 6.4 oz (102.2 kg)   SpO2 95%   BMI 35.30 kg/m   Wt Readings from Last 3 Encounters:  06/06/21 225 lb 6.4 oz (102.2 kg)  02/21/21 212 lb 12.8 oz (96.5 kg)  11/07/20 172 lb 9.6 oz (78.3 kg)    Physical Exam Vitals reviewed.  Constitutional:      General: He is not in acute distress.    Appearance: Normal appearance. He is morbidly obese. He is not ill-appearing, toxic-appearing or diaphoretic.  HENT:     Head: Normocephalic and atraumatic.  Eyes:     General: No scleral icterus.       Right eye: No discharge.        Left eye: No discharge.     Conjunctiva/sclera: Conjunctivae normal.  Cardiovascular:     Rate and Rhythm: Normal rate and regular rhythm.     Heart sounds: Normal heart sounds. No murmur heard.   No friction rub. No gallop.  Pulmonary:     Effort: Pulmonary effort is normal. No respiratory distress.     Breath sounds: Normal breath sounds. No stridor. No wheezing, rhonchi or rales.  Musculoskeletal:        General: Normal range of motion.     Cervical back: Normal range of motion.     Right lower leg: No edema.     Left lower leg: No edema.  Skin:    General: Skin is warm and dry.     Comments: Generalized dry, flaky skin  Neurological:     Mental Status: He is alert and oriented to person, place,  and time. Mental status is at baseline.  Psychiatric:        Mood and Affect: Mood normal.        Behavior: Behavior normal.        Thought Content: Thought content normal.        Judgment: Judgment normal.   Diabetic Foot Exam - Simple   Simple Foot Form Diabetic Foot exam was performed with the following findings: Yes 06/06/2021  8:44 AM  Visual Inspection No deformities,  no ulcerations, no other skin breakdown bilaterally: Yes Sensation Testing Intact to touch and monofilament testing bilaterally: Yes Pulse Check Posterior Tibialis and Dorsalis pulse intact bilaterally: Yes Comments     Lab Results  Component Value Date   TSH 2.700 11/07/2020   Lab Results  Component Value Date   WBC 14.0 (H) 02/21/2021   HGB 13.9 02/21/2021   HCT 41.2 02/21/2021   MCV 94 02/21/2021   PLT 177 02/21/2021   Lab Results  Component Value Date   NA 140 02/21/2021   K 4.2 02/21/2021   CO2 24 02/21/2021   GLUCOSE 131 (H) 02/21/2021   BUN 19 02/21/2021   CREATININE 1.08 02/21/2021   BILITOT 0.5 02/21/2021   ALKPHOS 84 02/21/2021   AST 14 02/21/2021   ALT 18 02/21/2021   PROT 6.8 02/21/2021   ALBUMIN 4.3 02/21/2021   CALCIUM 9.4 02/21/2021   ANIONGAP 8 08/10/2020   EGFR 74 02/21/2021   Lab Results  Component Value Date   CHOL 157 02/21/2021   Lab Results  Component Value Date   HDL 70 02/21/2021   Lab Results  Component Value Date   LDLCALC 74 02/21/2021   Lab Results  Component Value Date   TRIG 68 02/21/2021   Lab Results  Component Value Date   CHOLHDL 2.2 02/21/2021   Lab Results  Component Value Date   HGBA1C 5.9 (H) 02/21/2021

## 2021-06-06 NOTE — Patient Instructions (Signed)
Please schedule your eye exam.

## 2021-06-07 LAB — CMP14+EGFR
ALT: 11 IU/L (ref 0–44)
AST: 12 IU/L (ref 0–40)
Albumin/Globulin Ratio: 2 (ref 1.2–2.2)
Albumin: 4.3 g/dL (ref 3.8–4.8)
Alkaline Phosphatase: 102 IU/L (ref 44–121)
BUN/Creatinine Ratio: 12 (ref 10–24)
BUN: 13 mg/dL (ref 8–27)
Bilirubin Total: 0.5 mg/dL (ref 0.0–1.2)
CO2: 23 mmol/L (ref 20–29)
Calcium: 9 mg/dL (ref 8.6–10.2)
Chloride: 102 mmol/L (ref 96–106)
Creatinine, Ser: 1.05 mg/dL (ref 0.76–1.27)
Globulin, Total: 2.2 g/dL (ref 1.5–4.5)
Glucose: 126 mg/dL — ABNORMAL HIGH (ref 70–99)
Potassium: 4.6 mmol/L (ref 3.5–5.2)
Sodium: 140 mmol/L (ref 134–144)
Total Protein: 6.5 g/dL (ref 6.0–8.5)
eGFR: 76 mL/min/{1.73_m2} (ref >59)

## 2021-06-07 LAB — CBC WITH DIFFERENTIAL/PLATELET
Basophils Absolute: 0.1 10*3/uL (ref 0.0–0.2)
Basos: 0 %
EOS (ABSOLUTE): 0.2 10*3/uL (ref 0.0–0.4)
Eos: 2 %
Hematocrit: 41.4 % (ref 37.5–51.0)
Hemoglobin: 14.2 g/dL (ref 13.0–17.7)
Immature Grans (Abs): 0.1 10*3/uL (ref 0.0–0.1)
Immature Granulocytes: 1 %
Lymphocytes Absolute: 7.5 10*3/uL — ABNORMAL HIGH (ref 0.7–3.1)
Lymphs: 54 %
MCH: 32.1 pg (ref 26.6–33.0)
MCHC: 34.3 g/dL (ref 31.5–35.7)
MCV: 94 fL (ref 79–97)
Monocytes Absolute: 1 10*3/uL — ABNORMAL HIGH (ref 0.1–0.9)
Monocytes: 8 %
Neutrophils Absolute: 4.7 10*3/uL (ref 1.4–7.0)
Neutrophils: 35 %
Platelets: 172 10*3/uL (ref 150–450)
RBC: 4.42 x10E6/uL (ref 4.14–5.80)
RDW: 11.8 % (ref 11.6–15.4)
WBC: 13.6 10*3/uL — ABNORMAL HIGH (ref 3.4–10.8)

## 2021-06-07 LAB — LIPID PANEL
Chol/HDL Ratio: 2.6 ratio (ref 0.0–5.0)
Cholesterol, Total: 131 mg/dL (ref 100–199)
HDL: 51 mg/dL (ref >39)
LDL Chol Calc (NIH): 60 mg/dL (ref 0–99)
Triglycerides: 113 mg/dL (ref 0–149)
VLDL Cholesterol Cal: 20 mg/dL (ref 5–40)

## 2021-06-07 LAB — VITAMIN B12: Vitamin B-12: 523 pg/mL (ref 232–1245)

## 2021-07-18 DIAGNOSIS — Z905 Acquired absence of kidney: Secondary | ICD-10-CM | POA: Diagnosis not present

## 2021-07-18 DIAGNOSIS — R918 Other nonspecific abnormal finding of lung field: Secondary | ICD-10-CM | POA: Diagnosis not present

## 2021-07-18 DIAGNOSIS — R911 Solitary pulmonary nodule: Secondary | ICD-10-CM | POA: Diagnosis not present

## 2021-07-18 DIAGNOSIS — C641 Malignant neoplasm of right kidney, except renal pelvis: Secondary | ICD-10-CM | POA: Diagnosis not present

## 2021-07-29 ENCOUNTER — Telehealth: Payer: Self-pay | Admitting: Family Medicine

## 2021-07-29 NOTE — Telephone Encounter (Signed)
Pts wife called to let PCP know that pts insurance wont cover his Eliquis Rx so they are unable to get it filled.  Unsure if it requires a PA or not.   Needs advise on what to do.

## 2021-07-29 NOTE — Telephone Encounter (Signed)
Spoke to pharmacy -  No PA needed. Medication is covered  3 month supply - $141 1 month supply- $47  Attempted to contact patient - mailboxfull

## 2021-08-05 ENCOUNTER — Other Ambulatory Visit: Payer: Self-pay | Admitting: Family Medicine

## 2021-08-05 DIAGNOSIS — F411 Generalized anxiety disorder: Secondary | ICD-10-CM

## 2021-08-05 DIAGNOSIS — E1169 Type 2 diabetes mellitus with other specified complication: Secondary | ICD-10-CM

## 2021-09-12 ENCOUNTER — Ambulatory Visit (INDEPENDENT_AMBULATORY_CARE_PROVIDER_SITE_OTHER): Payer: Medicare Other | Admitting: Family Medicine

## 2021-09-12 ENCOUNTER — Encounter: Payer: Self-pay | Admitting: Family Medicine

## 2021-09-12 VITALS — BP 114/65 | HR 97 | Temp 97.8°F | Ht 67.0 in | Wt 230.4 lb

## 2021-09-12 DIAGNOSIS — E1169 Type 2 diabetes mellitus with other specified complication: Secondary | ICD-10-CM | POA: Diagnosis not present

## 2021-09-12 DIAGNOSIS — E1121 Type 2 diabetes mellitus with diabetic nephropathy: Secondary | ICD-10-CM

## 2021-09-12 DIAGNOSIS — E1159 Type 2 diabetes mellitus with other circulatory complications: Secondary | ICD-10-CM

## 2021-09-12 DIAGNOSIS — E11649 Type 2 diabetes mellitus with hypoglycemia without coma: Secondary | ICD-10-CM

## 2021-09-12 DIAGNOSIS — Z85528 Personal history of other malignant neoplasm of kidney: Secondary | ICD-10-CM

## 2021-09-12 DIAGNOSIS — I152 Hypertension secondary to endocrine disorders: Secondary | ICD-10-CM

## 2021-09-12 DIAGNOSIS — E785 Hyperlipidemia, unspecified: Secondary | ICD-10-CM | POA: Diagnosis not present

## 2021-09-12 DIAGNOSIS — F411 Generalized anxiety disorder: Secondary | ICD-10-CM

## 2021-09-12 DIAGNOSIS — Z794 Long term (current) use of insulin: Secondary | ICD-10-CM | POA: Diagnosis not present

## 2021-09-12 LAB — BAYER DCA HB A1C WAIVED: HB A1C (BAYER DCA - WAIVED): 7.7 % — ABNORMAL HIGH (ref 4.8–5.6)

## 2021-09-12 MED ORDER — RYBELSUS 7 MG PO TABS: 7.0000 mg | ORAL_TABLET | Freq: Every day | ORAL | 1 refills | Status: DC

## 2021-09-12 NOTE — Progress Notes (Signed)
Assessment & Plan:  1. Type 2 diabetes mellitus with hypoglycemia without coma, with long-term current use of insulin (HCC) Lab Results  Component Value Date   HGBA1C 7.7 (H) 09/12/2021   HGBA1C 7.0 (H) 06/06/2021   HGBA1C 5.9 (H) 02/21/2021    - Diabetes is not at goal of A1c < 7. - Medications: continue current medications, start Rybelsus.  Rybelsus 3 mg #30 sample provided in office today.  Patient understands to take this first and then start 7 mg prescription. - Home glucose monitoring: Continue monitoring - Patient is currently taking a statin. Patient is taking an ACE-inhibitor/ARB.  - Instruction/counseling given: reminded to get eye exam, discussed the need for weight loss, and discussed diet.  Patient declined appointment in our office for retinal eye exam.  Diabetes Health Maintenance Due  Topic Date Due   OPHTHALMOLOGY EXAM  Never done   HEMOGLOBIN A1C  03/15/2022   FOOT EXAM  06/07/2022    Lab Results  Component Value Date   LABMICR 32.8 02/21/2021   LABMICR 43.1 08/25/2019   - Lipid panel - CBC with Differential/Platelet - CMP14+EGFR - Bayer DCA Hb A1c Waived - Semaglutide (RYBELSUS) 7 MG TABS; Take 7 mg by mouth daily.  Dispense: 30 tablet; Refill: 1  2. Diabetic nephropathy associated with type 2 diabetes mellitus ([employer]) Diabetes is uncontrolled, we are working on this.  Continue lisinopril daily. - CMP14+EGFR  3. Hypertension associated with type 2 diabetes mellitus ([place]) Well controlled on current regimen.  - Lipid panel - CBC with Differential/Platelet - CMP14+EGFR  4. Hyperlipidemia associated with type 2 diabetes mellitus ([place]) Well controlled on current regimen.  - Lipid panel - CMP14+EGFR  5. Generalized anxiety disorder Well controlled on current regimen.  - CMP14+EGFR  6. History of renal cell carcinoma Managed by urology.  7. Morbid obesity ([place]) Encouraged healthy eating and exercise.  Starting Rybelsus for diabetes, which may aid in  weight loss. - Lipid panel - CBC with Differential/Platelet - CMP14+EGFR - Semaglutide (RYBELSUS) 7 MG TABS; Take 7 mg by mouth daily.  Dispense: 30 tablet; Refill: 1   Return in about 3 months (around 12/13/2021) for follow-up of chronic medication conditions.  Hendricks Limes, MSN, APRN, FNP-C [facility]  Subjective:    Patient ID: George Anthony, male    DOB: 07/10/1950, 71 y.o.   MRN: [id number]  Patient Care Team: Loman Brooklyn, FNP as PCP - General (Family Medicine) Eloise Harman, DO as Consulting Physician (Internal Medicine) Harlen Labs, MD as Referring Physician (Optometry)   Chief Complaint:  Chief Complaint  Patient presents with   Medical Management of Chronic Issues    HPI: George Anthony is a 71 y.o. male presenting on 09/12/2021 for Medical Management of Chronic Issues  Patient is accompanied by his wife, who he is okay with being present.  Diabetes: Current symptoms include: none. Known diabetic complications: nephropathy. Medication compliance: yes, taking metformin 1,000 mg in the AM and 500 mg in the PM. Current diet: in general, an "unhealthy" diet. Current exercise: none. Home blood sugar records: BGs are running  consistent with Hgb A1C. Is he  on ACE inhibitor or angiotensin II receptor blocker? Yes (Lisinopril). Is he on a statin? Yes (Atorvastatin).   Hypertension: patient does not check his blood pressure at home.   Anxiety: taking Lexapro and doing well with it.      09/12/2021    9:48 AM 06/06/2021    8:33 AM 02/21/2021  9:08 AM  Depression screen PHQ 2/9  Decreased Interest 0 0 0  Down, Depressed, Hopeless 0 0 0  PHQ - 2 Score 0 0 0  Altered sleeping 0 0 1  Tired, decreased energy 0 0 1  Change in appetite 0 2 1  Feeling bad or failure about yourself  0 0 0  Trouble concentrating 0 0 0  Moving slowly or fidgety/restless 0 0 0  Suicidal thoughts 0 0 0  PHQ-9 Score 0 2 3  Difficult doing work/chores  Not difficult at all Not difficult at all Not difficult at all      09/12/2021    9:48 AM 06/06/2021    8:34 AM 02/21/2021    9:08 AM 11/07/2020    2:59 PM  GAD 7 : Generalized Anxiety Score  Nervous, Anxious, on Edge 0 0 0 0  Control/stop worrying 0 0 1 2  Worry too much - different things 0 0 0 0  Trouble relaxing 0 0 0 0  Restless 0 0 0 0  Easily annoyed or irritable 0 _0 Afraid - awful might happen 0 0 0 0  Total GAD 7 Score 0 _1 Anxiety Difficulty Not difficult at all Not difficult at all Not difficult at all Not difficult at all   History of renal cell carcinoma: right nephrectomy with IVC thrombectomy on 09/04/2020. Patient is being followed by urology who he is seeing every 3 months for the first year for CT/MRI and blood work.   New Complaints: None   Social history:  Relevant past medical, surgical, family and social history reviewed and updated as indicated. Interim medical history since our last visit reviewed.  Allergies and medications reviewed and updated.  DATA REVIEWED: CHART IN EPIC  ROS: Negative unless specifically indicated above in HPI.    Current Outpatient Medications:    acetaminophen (TYLENOL) 500 MG tablet, Take 1,000 mg by mouth every 6 (six) hours as needed for moderate pain., Disp: , Rfl:    apixaban (ELIQUIS) 5 MG TABS tablet, Take 1 tablet (5 mg total) by mouth 2 (two) times daily., Disp: 180 tablet, Rfl: 1   atorvastatin (LIPITOR) 40 MG tablet, TAKE 1 TABLET BY MOUTH AT BEDTIME, Disp: 90 tablet, Rfl: 0   escitalopram (LEXAPRO) 10 MG tablet, Take 1 tablet by mouth once daily, Disp: 90 tablet, Rfl: 0   glucose blood test strip, Check TID and PRN dx E11.9, Disp: 100 each, Rfl: 11   lisinopril (ZESTRIL) 10 MG tablet, Take 1 tablet (10 mg total) by mouth daily., Disp: 90 tablet, Rfl: 1   metFORMIN (GLUCOPHAGE) 500 MG tablet, Take 2 tablets (1,000 mg total) by mouth daily with breakfast AND 1 tablet (500 mg total) daily with supper., Disp: 270  tablet, Rfl: 1   No Known Allergies Past Medical History:  Diagnosis Date   Anxiety    Diabetes mellitus without complication ([place])    Diabetic nephropathy associated with type 2 diabetes mellitus ([place]) 02/21/2021   DVT (deep venous thrombosis) ([place])    Enlarged heart    Hyperlipidemia    Hypertension    Renal cell carcinoma [facility])     Past Surgical History:  Procedure Laterality Date   BIOPSY  09/16/2019   Procedure: BIOPSY;  Surgeon: Eloise Harman, DO;  [facility];  Service: Endoscopy;;   COLONOSCOPY WITH PROPOFOL N/A 09/16/2019   Procedure: COLONOSCOPY WITH PROPOFOL;  Surgeon: Eloise Harman, DO;  [facility];  Service: Endoscopy;  Laterality: N/A;  7:30am   POLYPECTOMY  09/16/2019   Procedure: POLYPECTOMY;  Surgeon: Eloise Harman, DO;  [facility];  Service: Endoscopy;;    Social History   Socioeconomic History   Marital status: Married    Spouse name: charlene   Number of children: 1   Years of education: Not on file   Highest education level: Not on file  Occupational History    Employer: [employer]   Occupation: Retired  Tobacco Use   Smoking status: Every Day    Packs/day: 1.00    Years: 40.00    Total pack years: 40.00    Types: Cigarettes   Smokeless tobacco: Never  Vaping Use   Vaping Use: Never used  Substance and Sexual Activity   Alcohol use: Yes    Alcohol/week: 3.0 standard drinks of alcohol    Types: 3 Cans of beer per week    Comment: drinking a couple times a month for the last year.- used to drink 2 12 packs a week   Drug use: No   Sexual activity: Yes  Other Topics Concern   Not on file  Social History Narrative   Not on file   Social Determinants of Health   Financial Resource Strain: Not on file  Food Insecurity: Not on file  Transportation Needs: Not on file  Physical Activity: Not on file  Stress: Not on file  Social Connections: Not on file  Intimate Partner Violence: Not on  file        Objective:    BP 114/65   Pulse 97   Temp 97.8 F (36.6 C) (Temporal)   Ht _0  (1.702 m)   Wt 230 lb 6.4 oz (104.5 kg)   SpO2 94%   BMI 36.09 kg/m   Wt Readings from Last 3 Encounters:  09/12/21 230 lb 6.4 oz (104.5 kg)  06/06/21 225 lb 6.4 oz (102.2 kg)  02/21/21 212 lb 12.8 oz (96.5 kg)    Physical Exam Vitals reviewed.  Constitutional:      General: He is not in acute distress.    Appearance: Normal appearance. He is morbidly obese. He is not ill-appearing, toxic-appearing or diaphoretic.  HENT:     Head: Normocephalic and atraumatic.  Eyes:     General: No scleral icterus.       Right eye: No discharge.        Left eye: No discharge.     Conjunctiva/sclera: Conjunctivae normal.  Cardiovascular:     Rate and Rhythm: Normal rate and regular rhythm.     Heart sounds: Normal heart sounds. No murmur heard.    No friction rub. No gallop.  Pulmonary:     Effort: Pulmonary effort is normal. No respiratory distress.     Breath sounds: Normal breath sounds. No stridor. No wheezing, rhonchi or rales.  Musculoskeletal:        General: Normal range of motion.     Cervical back: Normal range of motion.     Right lower leg: No edema.     Left lower leg: No edema.  Skin:    General: Skin is warm and dry.  Neurological:     Mental Status: He is alert and oriented to person, place, and time. Mental status is at baseline.  Psychiatric:        Mood and Affect: Mood normal.        Behavior: Behavior normal.        Thought  Content: Thought content normal.        Judgment: Judgment normal.     Lab Results  Component Value Date   TSH 2.700 11/07/2020   Lab Results  Component Value Date   WBC 13.6 (H) 06/06/2021   HGB 14.2 06/06/2021   HCT 41.4 06/06/2021   MCV 94 06/06/2021   PLT 172 06/06/2021   Lab Results  Component Value Date   NA 140 06/06/2021   K 4.6 06/06/2021   CO2 23 06/06/2021   GLUCOSE 126 (H) 06/06/2021   BUN 13 06/06/2021    CREATININE 1.05 06/06/2021   BILITOT 0.5 06/06/2021   ALKPHOS 102 06/06/2021   AST 12 06/06/2021   ALT 11 06/06/2021   PROT 6.5 06/06/2021   ALBUMIN 4.3 06/06/2021   CALCIUM 9.0 06/06/2021   ANIONGAP 8 08/10/2020   EGFR 76 06/06/2021   Lab Results  Component Value Date   CHOL 131 06/06/2021   Lab Results  Component Value Date   HDL 51 06/06/2021   Lab Results  Component Value Date   LDLCALC 60 06/06/2021   Lab Results  Component Value Date   TRIG 113 06/06/2021   Lab Results  Component Value Date   CHOLHDL 2.6 06/06/2021   Lab Results  Component Value Date   HGBA1C 7.0 (H) 06/06/2021

## 2021-09-12 NOTE — Patient Instructions (Addendum)
Schedule your eye exam.  Start Rybelsus 3 mg once daily x30 days, then you will go up to the 7 mg dose.

## 2021-09-13 LAB — CBC WITH DIFFERENTIAL/PLATELET
Basophils Absolute: 0.1 10*3/uL (ref 0.0–0.2)
Basos: 0 %
EOS (ABSOLUTE): 0.3 10*3/uL (ref 0.0–0.4)
Eos: 2 %
Hematocrit: 39.7 % (ref 37.5–51.0)
Hemoglobin: 13.3 g/dL (ref 13.0–17.7)
Immature Grans (Abs): 0.1 10*3/uL (ref 0.0–0.1)
Immature Granulocytes: 1 %
Lymphocytes Absolute: 9.9 10*3/uL — ABNORMAL HIGH (ref 0.7–3.1)
Lymphs: 56 %
MCH: 32.1 pg (ref 26.6–33.0)
MCHC: 33.5 g/dL (ref 31.5–35.7)
MCV: 96 fL (ref 79–97)
Monocytes Absolute: 1.4 10*3/uL — ABNORMAL HIGH (ref 0.1–0.9)
Monocytes: 8 %
Neutrophils Absolute: 5.9 10*3/uL (ref 1.4–7.0)
Neutrophils: 33 %
Platelets: 189 10*3/uL (ref 150–450)
RBC: 4.14 x10E6/uL (ref 4.14–5.80)
RDW: 11.9 % (ref 11.6–15.4)
WBC: 17.6 10*3/uL — ABNORMAL HIGH (ref 3.4–10.8)

## 2021-09-13 LAB — CMP14+EGFR
ALT: 11 IU/L (ref 0–44)
AST: 13 IU/L (ref 0–40)
Albumin/Globulin Ratio: 1.9 (ref 1.2–2.2)
Albumin: 4.4 g/dL (ref 3.9–4.9)
Alkaline Phosphatase: 111 IU/L (ref 44–121)
BUN/Creatinine Ratio: 14 (ref 10–24)
BUN: 17 mg/dL (ref 8–27)
Bilirubin Total: 0.7 mg/dL (ref 0.0–1.2)
CO2: 22 mmol/L (ref 20–29)
Calcium: 9.3 mg/dL (ref 8.6–10.2)
Chloride: 101 mmol/L (ref 96–106)
Creatinine, Ser: 1.24 mg/dL (ref 0.76–1.27)
Globulin, Total: 2.3 g/dL (ref 1.5–4.5)
Glucose: 152 mg/dL — ABNORMAL HIGH (ref 70–99)
Potassium: 5 mmol/L (ref 3.5–5.2)
Sodium: 140 mmol/L (ref 134–144)
Total Protein: 6.7 g/dL (ref 6.0–8.5)
eGFR: 63 mL/min/{1.73_m2} (ref >59)

## 2021-09-13 LAB — LIPID PANEL
Chol/HDL Ratio: 3.4 ratio (ref 0.0–5.0)
Cholesterol, Total: 119 mg/dL (ref 100–199)
HDL: 35 mg/dL — ABNORMAL LOW (ref >39)
LDL Chol Calc (NIH): 59 mg/dL (ref 0–99)
Triglycerides: 140 mg/dL (ref 0–149)
VLDL Cholesterol Cal: 25 mg/dL (ref 5–40)

## 2021-09-13 NOTE — Progress Notes (Signed)
Patient calling back. Please return call.

## 2021-09-24 ENCOUNTER — Other Ambulatory Visit: Payer: Self-pay | Admitting: Family Medicine

## 2021-09-24 DIAGNOSIS — D72820 Lymphocytosis (symptomatic): Secondary | ICD-10-CM

## 2021-11-05 ENCOUNTER — Other Ambulatory Visit: Payer: Self-pay | Admitting: Family Medicine

## 2021-11-05 DIAGNOSIS — F411 Generalized anxiety disorder: Secondary | ICD-10-CM

## 2021-11-05 DIAGNOSIS — E1169 Type 2 diabetes mellitus with other specified complication: Secondary | ICD-10-CM

## 2021-11-27 ENCOUNTER — Telehealth: Payer: Self-pay | Admitting: Family Medicine

## 2021-12-05 DIAGNOSIS — C641 Malignant neoplasm of right kidney, except renal pelvis: Secondary | ICD-10-CM | POA: Diagnosis not present

## 2021-12-05 DIAGNOSIS — R918 Other nonspecific abnormal finding of lung field: Secondary | ICD-10-CM | POA: Diagnosis not present

## 2021-12-06 ENCOUNTER — Telehealth: Payer: Self-pay | Admitting: Family Medicine

## 2021-12-06 NOTE — Telephone Encounter (Signed)
Wife aware no samples   

## 2021-12-19 ENCOUNTER — Ambulatory Visit (INDEPENDENT_AMBULATORY_CARE_PROVIDER_SITE_OTHER): Payer: Medicare Other | Admitting: Family Medicine

## 2021-12-19 ENCOUNTER — Encounter: Payer: Self-pay | Admitting: Family Medicine

## 2021-12-19 VITALS — BP 138/83 | HR 96 | Temp 97.0°F | Ht 67.0 in | Wt 231.6 lb

## 2021-12-19 DIAGNOSIS — E1159 Type 2 diabetes mellitus with other circulatory complications: Secondary | ICD-10-CM | POA: Diagnosis not present

## 2021-12-19 DIAGNOSIS — E1169 Type 2 diabetes mellitus with other specified complication: Secondary | ICD-10-CM

## 2021-12-19 DIAGNOSIS — E875 Hyperkalemia: Secondary | ICD-10-CM | POA: Diagnosis not present

## 2021-12-19 DIAGNOSIS — I152 Hypertension secondary to endocrine disorders: Secondary | ICD-10-CM | POA: Diagnosis not present

## 2021-12-19 DIAGNOSIS — E785 Hyperlipidemia, unspecified: Secondary | ICD-10-CM | POA: Diagnosis not present

## 2021-12-19 DIAGNOSIS — E11649 Type 2 diabetes mellitus with hypoglycemia without coma: Secondary | ICD-10-CM | POA: Diagnosis not present

## 2021-12-19 DIAGNOSIS — F411 Generalized anxiety disorder: Secondary | ICD-10-CM | POA: Diagnosis not present

## 2021-12-19 DIAGNOSIS — Z86718 Personal history of other venous thrombosis and embolism: Secondary | ICD-10-CM | POA: Diagnosis not present

## 2021-12-19 DIAGNOSIS — Z794 Long term (current) use of insulin: Secondary | ICD-10-CM | POA: Diagnosis not present

## 2021-12-19 LAB — BAYER DCA HB A1C WAIVED: HB A1C (BAYER DCA - WAIVED): 8.2 % — ABNORMAL HIGH (ref 4.8–5.6)

## 2021-12-19 MED ORDER — ESCITALOPRAM OXALATE 10 MG PO TABS: 10.0000 mg | ORAL_TABLET | Freq: Every day | ORAL | 2 refills | Status: DC

## 2021-12-19 NOTE — Progress Notes (Signed)
Subjective:  Patient ID: George Anthony, male    DOB: [birth year], 71 y.o.   MRN: [id number]  Patient Care Team: Baruch Gouty, FNP as PCP - General (Family Medicine) Eloise Harman, DO as Consulting Physician (Internal Medicine) Harlen Labs, MD as Referring Physician (Optometry)   Chief Complaint:  Medical Management of Chronic Issues and Establish Care [name] patient )   HPI: George Anthony is a 71 y.o. male presenting on 12/19/2021 for Medical Management of Chronic Issues and Establish Care [name] patient )    1. Type 2 diabetes mellitus with hypoglycemia without coma, with long-term current use of insulin (HCC) Has been out of Rybelsus for at least 3 weeks due to being in the doughnut hole. Denies polyuria, polyphagia, or polydipsia.   2. Hypertension associated with type 2 diabetes mellitus ([place]) Compliant with medications. Not compliant with diet or exercise. Denies headaches, chest pain, shortness of breath, leg swelling, weakness, dizziness, or confusion.   3. Hyperlipidemia associated with type 2 diabetes mellitus ([place]) Compliant with statin therapy without associated myalgias. Does not follow a healthy diet or an exercise routine.   4. Generalized anxiety disorder States he is doing very well with current regimen. No associated side effects.    12/19/2021    9:29 AM 12/19/2021    8:59 AM 09/12/2021    9:48 AM 06/06/2021    8:34 AM  GAD 7 : Generalized Anxiety Score  Nervous, Anxious, on Edge 0 0 0 0  Control/stop worrying 0 0 0 0  Worry too much - different things 0 0 0 0  Trouble relaxing 0 0 0 0  Restless 0 0 0 0  Easily annoyed or irritable 1 0 0 1  Afraid - awful might happen 0 0 0 0  Total GAD 7 Score 1 0 0 1  Anxiety Difficulty Not difficult at all Not difficult at all Not difficult at all Not difficult at all       12/19/2021    9:29 AM 12/19/2021    8:59 AM 09/12/2021    9:48 AM 06/06/2021    8:33 AM 02/21/2021    9:08 AM  Depression  screen PHQ 2/9  Decreased Interest 0 0 0 0 0  Down, Depressed, Hopeless 0 0 0 0 0  PHQ - 2 Score 0 0 0 0 0  Altered sleeping 3 0 0 0 1  Tired, decreased energy 2 0 0 0 1  Change in appetite 0 0 0 2 1  Feeling bad or failure about yourself  0 0 0 0 0  Trouble concentrating 0 0 0 0 0  Moving slowly or fidgety/restless 0 0 0 0 0  Suicidal thoughts 0 0 0 0 0  PHQ-9 Score 5 0 0 2 3  Difficult doing work/chores Not difficult at all Not difficult at all Not difficult at all Not difficult at all Not difficult at all      5. Morbid obesity ([name]) Does not follow a diet or exercise routine. No significant changes in weight since last visit.      Relevant past medical, surgical, family, and social history reviewed and updated as indicated.  Allergies and medications reviewed and updated. Data reviewed: Chart in Epic.   Past Medical History:  Diagnosis Date   Anxiety    Diabetes mellitus without complication ([place])    Diabetic nephropathy associated with type 2 diabetes mellitus ([place]) 02/21/2021   DVT (deep venous thrombosis) ([place])  Enlarged heart    Hyperlipidemia    Hypertension    Renal cell carcinoma [facility])     Past Surgical History:  Procedure Laterality Date   BIOPSY  09/16/2019   Procedure: BIOPSY;  Surgeon: Eloise Harman, DO;  Location: [facility];  Service: Endoscopy;;   COLONOSCOPY WITH PROPOFOL N/A 09/16/2019   Procedure: COLONOSCOPY WITH PROPOFOL;  Surgeon: Eloise Harman, DO;  Location: [facility];  Service: Endoscopy;  Laterality: N/A;  7:30am   POLYPECTOMY  09/16/2019   Procedure: POLYPECTOMY;  Surgeon: Eloise Harman, DO;  Location: [facility];  Service: Endoscopy;;    Social History   Socioeconomic History   Marital status: Married    Spouse name: charlene   Number of children: 1   Years of education: Not on file   Highest education level: Not on file  Occupational History    Employer: [employer]   Occupation: Retired  Tobacco Use    Smoking status: Every Day    Packs/day: 1.00    Years: 40.00    Total pack years: 40.00    Types: Cigarettes   Smokeless tobacco: Never  Vaping Use   Vaping Use: Never used  Substance and Sexual Activity   Alcohol use: Yes    Alcohol/week: 3.0 standard drinks of alcohol    Types: 3 Cans of beer per week    Comment: drinking a couple times a month for the last year.- used to drink 2 12 packs a week   Drug use: No   Sexual activity: Yes  Other Topics Concern   Not on file  Social History Narrative   Not on file   Social Determinants of Health   Financial Resource Strain: Not on file  Food Insecurity: Not on file  Transportation Needs: Not on file  Physical Activity: Not on file  Stress: Not on file  Social Connections: Not on file  Intimate Partner Violence: Not on file    Outpatient Encounter Medications as of 12/19/2021  Medication Sig   acetaminophen (TYLENOL) 500 MG tablet Take 1,000 mg by mouth every 6 (six) hours as needed for moderate pain.   apixaban (ELIQUIS) 5 MG TABS tablet Take 1 tablet (5 mg total) by mouth 2 (two) times daily.   atorvastatin (LIPITOR) 40 MG tablet TAKE 1 TABLET BY MOUTH AT BEDTIME   CINNAMON PO Take by mouth.   glucose blood test strip Check TID and PRN dx E11.9   lisinopril (ZESTRIL) 10 MG tablet Take 1 tablet (10 mg total) by mouth daily.   metFORMIN (GLUCOPHAGE) 500 MG tablet Take 2 tablets (1,000 mg total) by mouth daily with breakfast AND 1 tablet (500 mg total) daily with supper.   escitalopram (LEXAPRO) 10 MG tablet Take 1 tablet (10 mg total) by mouth daily.   Semaglutide (RYBELSUS) 7 MG TABS Take 7 mg by mouth daily. (Patient not taking: Reported on 12/19/2021)   [DISCONTINUED] escitalopram (LEXAPRO) 10 MG tablet Take 1 tablet by mouth once daily (Patient not taking: Reported on 12/19/2021)   No facility-administered encounter medications on file as of 12/19/2021.    No Known Allergies  Review of Systems  Constitutional:   Positive for fatigue. Negative for activity change, appetite change, chills, diaphoresis, fever and unexpected weight change.  HENT: Negative.    Eyes: Negative.  Negative for photophobia and visual disturbance.  Respiratory:  Negative for cough, chest tightness and shortness of breath.   Cardiovascular:  Negative for chest pain, palpitations and leg  swelling.  Gastrointestinal:  Negative for abdominal pain, blood in stool, constipation, diarrhea, nausea and vomiting.  Endocrine: Negative.  Negative for cold intolerance, heat intolerance, polydipsia, polyphagia and polyuria.  Genitourinary:  Negative for decreased urine volume, difficulty urinating, dysuria, frequency and urgency.  Musculoskeletal:  Negative for arthralgias and myalgias.  Skin: Negative.   Allergic/Immunologic: Negative.   Neurological:  Negative for dizziness, tremors, seizures, syncope, facial asymmetry, speech difficulty, weakness, light-headedness, numbness and headaches.  Hematological: Negative.   Psychiatric/Behavioral:  Positive for agitation and sleep disturbance. Negative for behavioral problems, confusion, decreased concentration, dysphoric mood, hallucinations, self-injury and suicidal ideas. The patient is not nervous/anxious and is not hyperactive.   All other systems reviewed and are negative.       Objective:  BP 138/83   Pulse 96   Temp (!) 97 F (36.1 C) (Temporal)   Ht 5' 7" (1.702 m)   Wt 231 lb 9.6 oz (105.1 kg)   SpO2 97%   BMI 36.27 kg/m    Wt Readings from Last 3 Encounters:  12/19/21 231 lb 9.6 oz (105.1 kg)  09/12/21 230 lb 6.4 oz (104.5 kg)  06/06/21 225 lb 6.4 oz (102.2 kg)    Physical Exam Vitals and nursing note reviewed.  Constitutional:      General: He is not in acute distress.    Appearance: Normal appearance. He is well-developed and well-groomed. He is morbidly obese. He is not ill-appearing, toxic-appearing or diaphoretic.  HENT:     Head: Normocephalic and atraumatic.      Jaw: There is normal jaw occlusion.     Right Ear: Hearing normal.     Left Ear: Hearing normal.     Nose: Nose normal.     Mouth/Throat:     Lips: Pink.     Mouth: Mucous membranes are moist.     Pharynx: Oropharynx is clear. Uvula midline.  Eyes:     General: Lids are normal.     Conjunctiva/sclera: Conjunctivae normal.     Pupils: Pupils are equal, round, and reactive to light.  Neck:     Thyroid: No thyroid mass, thyromegaly or thyroid tenderness.     Vascular: No carotid bruit or JVD.     Trachea: Trachea and phonation normal.  Cardiovascular:     Rate and Rhythm: Normal rate and regular rhythm.     Chest Wall: PMI is not displaced.     Pulses: Normal pulses.     Heart sounds: Normal heart sounds. No murmur heard.    No friction rub. No gallop.  Pulmonary:     Effort: Pulmonary effort is normal. No respiratory distress.     Breath sounds: Normal breath sounds. No wheezing.  Abdominal:     General: Bowel sounds are normal. There is no distension or abdominal bruit.     Palpations: Abdomen is soft. There is no hepatomegaly or splenomegaly.     Tenderness: There is no abdominal tenderness. There is no right CVA tenderness or left CVA tenderness.     Hernia: No hernia is present.  Musculoskeletal:        General: Normal range of motion.     Cervical back: Normal range of motion and neck supple.     Right lower leg: No edema.     Left lower leg: No edema.  Lymphadenopathy:     Cervical: No cervical adenopathy.  Skin:    General: Skin is warm and dry.     Capillary Refill: Capillary refill takes less  than 2 seconds.     Coloration: Skin is not cyanotic, jaundiced or pale.     Findings: No rash.     Comments: Dry, flaky skin  Neurological:     General: No focal deficit present.     Mental Status: He is alert and oriented to person, place, and time.     Sensory: Sensation is intact.     Motor: Motor function is intact.     Coordination: Coordination is intact.      Gait: Gait is intact.     Deep Tendon Reflexes: Reflexes are normal and symmetric.  Psychiatric:        Attention and Perception: Attention and perception normal.        Mood and Affect: Mood and affect normal.        Speech: Speech normal.        Behavior: Behavior normal. Behavior is cooperative.        Thought Content: Thought content normal.        Cognition and Memory: Cognition and memory normal.        Judgment: Judgment normal.     Results for orders placed or performed in visit on 09/12/21  Lipid panel  Result Value Ref Range   Cholesterol, Total 119 100 - 199 mg/dL   Triglycerides 140 0 - 149 mg/dL   HDL 35 (L) >39 mg/dL   VLDL Cholesterol Cal 25 5 - 40 mg/dL   LDL Chol Calc (NIH) 59 0 - 99 mg/dL   Chol/HDL Ratio 3.4 0.0 - 5.0 ratio  CBC with Differential/Platelet  Result Value Ref Range   WBC 17.6 (H) 3.4 - 10.8 x10E3/uL   RBC 4.14 4.14 - 5.80 x10E6/uL   Hemoglobin 13.3 13.0 - 17.7 g/dL   Hematocrit 39.7 37.5 - 51.0 %   MCV 96 79 - 97 fL   MCH 32.1 26.6 - 33.0 pg   MCHC 33.5 31.5 - 35.7 g/dL   RDW 11.9 11.6 - 15.4 %   Platelets 189 150 - 450 x10E3/uL   Neutrophils 33 Not Estab. %   Lymphs 56 Not Estab. %   Monocytes 8 Not Estab. %   Eos 2 Not Estab. %   Basos 0 Not Estab. %   Neutrophils Absolute 5.9 1.4 - 7.0 x10E3/uL   Lymphocytes Absolute 9.9 (H) 0.7 - 3.1 x10E3/uL   Monocytes Absolute 1.4 (H) 0.1 - 0.9 x10E3/uL   EOS (ABSOLUTE) 0.3 0.0 - 0.4 x10E3/uL   Basophils Absolute 0.1 0.0 - 0.2 x10E3/uL   Immature Granulocytes 1 Not Estab. %   Immature Grans (Abs) 0.1 0.0 - 0.1 x10E3/uL   Hematology Comments: Note:   CMP14+EGFR  Result Value Ref Range   Glucose 152 (H) 70 - 99 mg/dL   BUN 17 8 - 27 mg/dL   Creatinine, Ser 1.24 0.76 - 1.27 mg/dL   eGFR 63 >59 mL/min/1.73   BUN/Creatinine Ratio 14 10 - 24   Sodium 140 134 - 144 mmol/L   Potassium 5.0 3.5 - 5.2 mmol/L   Chloride 101 96 - 106 mmol/L   CO2 22 20 - 29 mmol/L   Calcium 9.3 8.6 - 10.2 mg/dL    Total Protein 6.7 6.0 - 8.5 g/dL   Albumin 4.4 3.9 - 4.9 g/dL   Globulin, Total 2.3 1.5 - 4.5 g/dL   Albumin/Globulin Ratio 1.9 1.2 - 2.2   Bilirubin Total 0.7 0.0 - 1.2 mg/dL   Alkaline Phosphatase 111 44 - 121 IU/L   AST 13  0 - 40 IU/L   ALT 11 0 - 44 IU/L  Bayer DCA Hb A1c Waived  Result Value Ref Range   HB A1C (BAYER DCA - WAIVED) 7.7 (H) 4.8 - 5.6 %       Pertinent labs & imaging results that were available during my care of the patient were reviewed by me and considered in my medical decision making.  Assessment & Plan:  Rosbel was seen today for medical management of chronic issues and establish care.  Diagnoses and all orders for this visit:  Type 2 diabetes mellitus with hypoglycemia without coma, with long-term current use of insulin (HCC) A1C 8.2 today, has been out of Rybelsus due to being in the doughnut hole. Samples of Rybelsus provided in the office, 6 mg daily for bridge until first of the year. Educated on new dosing and verbalized understanding. Able to repeat dosing back. Other labs pending. Diet and exercise discussed in detail.  -     Lipid panel -     CMP14+EGFR -     Bayer DCA Hb A1c Waived -     CBC with Differential/Platelet  Hypertension associated with type 2 diabetes mellitus (HCC) BP well controlled. Changes were not made in regimen today. Goal BP is 130/80. Pt aware to report any persistent high or low readings. DASH diet and exercise encouraged. Exercise at least 150 minutes per week and increase as tolerated. Goal BMI > 25. Stress management encouraged. Avoid nicotine and tobacco product use. Avoid excessive alcohol and NSAID's. Avoid more than 2000 mg of sodium daily. Medications as prescribed. Follow up as scheduled.  -     Lipid panel -     CMP14+EGFR -     Bayer DCA Hb A1c Waived -     CBC with Differential/Platelet  Hyperlipidemia associated with type 2 diabetes mellitus ([place]) Diet encouraged - increase intake of fresh fruits and vegetables,  increase intake of lean proteins. Bake, broil, or grill foods. Avoid fried, greasy, and fatty foods. Avoid fast foods. Increase intake of fiber-rich whole grains. Exercise encouraged - at least 150 minutes per week and advance as tolerated. Goal BMI < 25. Continue medications as prescribed. Follow up in 3-6 months as discussed.  -     Lipid panel -     CMP14+EGFR  Generalized anxiety disorder Doing well on below, will continue.  -     escitalopram (LEXAPRO) 10 MG tablet; Take 1 tablet (10 mg total) by mouth daily.  Morbid obesity ([place]) Diet and exercise discussed in detail. Labs pending. -     Lipid panel -     CMP14+EGFR -     Bayer DCA Hb A1c Waived -     CBC with Differential/Platelet  Personal history of DVT (deep vein thrombosis) Currently in the doughnut hole and unable to afford Eliquis. Provided with Xarelto 20 mg daily to bridge until beginning of the year. Educated on proper dosing and risk factors. Verbalized new dosing regimen, able to repeat back.     Continue all other maintenance medications.  Follow up plan: Return in about 3 months (around 03/20/2022) for DM.   Continue healthy lifestyle choices, including diet (rich in fruits, vegetables, and lean proteins, and low in salt and simple carbohydrates) and exercise (at least 30 minutes of moderate physical activity daily).  Educational handout given for DM  The above assessment and management plan was discussed with the patient. The patient verbalized understanding of and has agreed to the management plan.  Patient is aware to call the clinic if they develop any new symptoms or if symptoms persist or worsen. Patient is aware when to return to the clinic for a follow-up visit. Patient educated on when it is appropriate to go to the emergency department.   Monia Pouch, FNP-C [facility] [phone]

## 2021-12-19 NOTE — Patient Instructions (Addendum)
Rybelsus 3 mg - take 2 pills daily  Xarelto 20 mg - take 1 pill nightly      Continue to monitor your blood sugars as we discussed and record them. Bring the log to your next appointment.  Take your medications as directed.    Goal Blood glucose:    Fasting (before meals) = 80 to 130   Within 2 hours of eating = less than 180   Understanding your Hemoglobin A1c:8.2     Diabetes Mellitus and Nutrition    I think that you would greatly benefit from seeing a nutritionist. If this is something you are interested in, please call Dr Jenne Campus at [phone] to schedule an appointment.   When you have diabetes (diabetes mellitus), it is very important to have healthy eating habits because your blood sugar (glucose) levels are greatly affected by what you eat and drink. Eating healthy foods in the appropriate amounts, at about the same times every day, can help you: Control your blood glucose. Lower your risk of heart disease. Improve your blood pressure. Reach or maintain a healthy weight.  Every person with diabetes is different, and each person has different needs for a meal plan. Your health care provider may recommend that you work with a diet and nutrition specialist (dietitian) to make a meal plan that is best for you. Your meal plan may vary depending on factors such as: The calories you need. The medicines you take. Your weight. Your blood glucose, blood pressure, and cholesterol levels. Your activity level. Other health conditions you have, such as heart or kidney disease.  How do carbohydrates affect me? Carbohydrates affect your blood glucose level more than any other type of food. Eating carbohydrates naturally increases the amount of glucose in your blood. Carbohydrate counting is a method for keeping track of how many carbohydrates you eat. Counting carbohydrates is important to keep your blood glucose at a healthy level, especially if you use insulin or take certain oral  diabetes medicines. It is important to know how many carbohydrates you can safely have in each meal. This is different for every person. Your dietitian can help you calculate how many carbohydrates you should have at each meal and for snack. Foods that contain carbohydrates include: Bread, cereal, rice, pasta, and crackers. Potatoes and corn. Peas, beans, and lentils. Milk and yogurt. Fruit and juice. Desserts, such as cakes, cookies, ice cream, and candy.  How does alcohol affect me? Alcohol can cause a sudden decrease in blood glucose (hypoglycemia), especially if you use insulin or take certain oral diabetes medicines. Hypoglycemia can be a life-threatening condition. Symptoms of hypoglycemia (sleepiness, dizziness, and confusion) are similar to symptoms of having too much alcohol. If your health care provider says that alcohol is safe for you, follow these guidelines: Limit alcohol intake to no more than 1 drink per day for nonpregnant women and 2 drinks per day for men. One drink equals 12 oz of beer, 5 oz of wine, or 1 oz of hard liquor. Do not drink on an empty stomach. Keep yourself hydrated with water, diet soda, or unsweetened iced tea. Keep in mind that regular soda, juice, and other mixers may contain a lot of sugar and must be counted as carbohydrates.  What are tips for following this plan?  Reading food labels Start by checking the serving size on the label. The amount of calories, carbohydrates, fats, and other nutrients listed on the label are based on one serving of the food. Many  foods contain more than one serving per package. Check the total grams (g) of carbohydrates in one serving. You can calculate the number of servings of carbohydrates in one serving by dividing the total carbohydrates by 15. For example, if a food has 30 g of total carbohydrates, it would be equal to 2 servings of carbohydrates. Check the number of grams (g) of saturated and trans fats in one  serving. Choose foods that have low or no amount of these fats. Check the number of milligrams (mg) of sodium in one serving. Most people should limit total sodium intake to less than 2,300 mg per day. Always check the nutrition information of foods labeled as "low-fat" or "nonfat". These foods may be higher in added sugar or refined carbohydrates and should be avoided. Talk to your dietitian to identify your daily goals for nutrients listed on the label.  Shopping Avoid buying canned, premade, or processed foods. These foods tend to be high in fat, sodium, and added sugar. Shop around the outside edge of the grocery store. This includes fresh fruits and vegetables, bulk grains, fresh meats, and fresh dairy.  Cooking Use low-heat cooking methods, such as baking, instead of high-heat cooking methods like deep frying. Cook using healthy oils, such as olive, canola, or sunflower oil. Avoid cooking with butter, cream, or high-fat meats.  Meal planning Eat meals and snacks regularly, preferably at the same times every day. Avoid going long periods of time without eating. Eat foods high in fiber, such as fresh fruits, vegetables, beans, and whole grains. Talk to your dietitian about how many servings of carbohydrates you can eat at each meal. Eat 4-6 ounces of lean protein each day, such as lean meat, chicken, fish, eggs, or tofu. 1 ounce is equal to 1 ounce of meat, chicken, or fish, 1 egg, or 1/4 cup of tofu. Eat some foods each day that contain healthy fats, such as avocado, nuts, seeds, and fish.  Lifestyle  Check your blood glucose regularly. Exercise at least 30 minutes 5 or more days each week, or as told by your health care provider. Take medicines as told by your health care provider. Do not use any products that contain nicotine or tobacco, such as cigarettes and e-cigarettes. If you need help quitting, ask your health care provider. Work with a Social worker or diabetes educator to  identify strategies to manage stress and any emotional and social challenges.  What are some questions to ask my health care provider? Do I need to meet with a diabetes educator? Do I need to meet with a dietitian? What number can I call if I have questions? When are the best times to check my blood glucose?  Where to find more information: American Diabetes Association: diabetes.org/food-and-fitness/food Academy of Nutrition and Dietetics: PokerClues.dk [employer] of Diabetes and Digestive and Kidney Diseases (NIH): ContactWire.be  Summary A healthy meal plan will help you control your blood glucose and maintain a healthy lifestyle. Working with a diet and nutrition specialist (dietitian) can help you make a meal plan that is best for you. Keep in mind that carbohydrates and alcohol have immediate effects on your blood glucose levels. It is important to count carbohydrates and to use alcohol carefully. This information is not intended to replace advice given to you by your health care provider. Make sure you discuss any questions you have with your health care provider. Document Released: 10/03/2004 Document Revised: 02/11/2016 Document Reviewed: 02/11/2016 Elsevier Interactive Patient Education  [employer].

## 2021-12-20 ENCOUNTER — Other Ambulatory Visit: Payer: Self-pay | Admitting: Family Medicine

## 2021-12-20 ENCOUNTER — Telehealth: Payer: Self-pay | Admitting: *Deleted

## 2021-12-20 DIAGNOSIS — E875 Hyperkalemia: Secondary | ICD-10-CM

## 2021-12-20 LAB — CMP14+EGFR
ALT: 10 IU/L (ref 0–44)
AST: 6 IU/L (ref 0–40)
Albumin/Globulin Ratio: 1.8 (ref 1.2–2.2)
Albumin: 4.1 g/dL (ref 3.9–4.9)
Alkaline Phosphatase: 126 IU/L — ABNORMAL HIGH (ref 44–121)
BUN/Creatinine Ratio: 13 (ref 10–24)
BUN: 16 mg/dL (ref 8–27)
Bilirubin Total: 0.4 mg/dL (ref 0.0–1.2)
CO2: 24 mmol/L (ref 20–29)
Calcium: 9 mg/dL (ref 8.6–10.2)
Chloride: 102 mmol/L (ref 96–106)
Creatinine, Ser: 1.25 mg/dL (ref 0.76–1.27)
Globulin, Total: 2.3 g/dL (ref 1.5–4.5)
Glucose: 207 mg/dL — ABNORMAL HIGH (ref 70–99)
Potassium: 5.9 mmol/L (ref 3.5–5.2)
Sodium: 140 mmol/L (ref 134–144)
Total Protein: 6.4 g/dL (ref 6.0–8.5)
eGFR: 62 mL/min/{1.73_m2} (ref >59)

## 2021-12-20 LAB — CBC WITH DIFFERENTIAL/PLATELET
Basophils Absolute: 0.1 10*3/uL (ref 0.0–0.2)
Basos: 0 %
EOS (ABSOLUTE): 0.3 10*3/uL (ref 0.0–0.4)
Eos: 2 %
Hematocrit: 40.8 % (ref 37.5–51.0)
Hemoglobin: 13.4 g/dL (ref 13.0–17.7)
Immature Grans (Abs): 0.1 10*3/uL (ref 0.0–0.1)
Immature Granulocytes: 1 %
Lymphocytes Absolute: 10.6 10*3/uL — ABNORMAL HIGH (ref 0.7–3.1)
Lymphs: 60 %
MCH: 31.2 pg (ref 26.6–33.0)
MCHC: 32.8 g/dL (ref 31.5–35.7)
MCV: 95 fL (ref 79–97)
Monocytes Absolute: 0.6 10*3/uL (ref 0.1–0.9)
Monocytes: 3 %
Neutrophils Absolute: 5.8 10*3/uL (ref 1.4–7.0)
Neutrophils: 34 %
Platelets: 173 10*3/uL (ref 150–450)
RBC: 4.3 x10E6/uL (ref 4.14–5.80)
RDW: 12 % (ref 11.6–15.4)
WBC: 17.4 10*3/uL — ABNORMAL HIGH (ref 3.4–10.8)

## 2021-12-20 LAB — LIPID PANEL
Chol/HDL Ratio: 3.6 ratio (ref 0.0–5.0)
Cholesterol, Total: 126 mg/dL (ref 100–199)
HDL: 35 mg/dL — ABNORMAL LOW (ref >39)
LDL Chol Calc (NIH): 72 mg/dL (ref 0–99)
Triglycerides: 102 mg/dL (ref 0–149)
VLDL Cholesterol Cal: 19 mg/dL (ref 5–40)

## 2021-12-20 MED ORDER — SODIUM POLYSTYRENE SULFONATE 15 GM/60ML PO SUSP: 30.0000 g | Freq: Once | ORAL | 0 refills | Status: AC

## 2021-12-20 NOTE — Addendum Note (Signed)
Addended by: Baruch Gouty on: 12/20/2021 01:36 PM   Modules accepted: Orders

## 2021-12-20 NOTE — Progress Notes (Signed)
Repeat order placed

## 2021-12-20 NOTE — Telephone Encounter (Signed)
Critical Lab  Potassium - 5.9

## 2021-12-20 NOTE — Telephone Encounter (Signed)
Refer to lab result

## 2021-12-20 NOTE — Telephone Encounter (Signed)
[email or web address]

## 2021-12-20 NOTE — Telephone Encounter (Signed)
Spoke to patients wife per signed DPR. She will contact patient and try to have him come by today.  She said it might not be till early next week.  I strongly suggested that it be done soon as possible.

## 2021-12-24 ENCOUNTER — Other Ambulatory Visit: Payer: Medicare Other

## 2021-12-24 DIAGNOSIS — E875 Hyperkalemia: Secondary | ICD-10-CM

## 2021-12-24 LAB — POTASSIUM: Potassium: 4.9 mmol/L (ref 3.5–5.2)

## 2021-12-25 NOTE — Progress Notes (Signed)
Patient's wife returning call. Please call back

## 2022-01-15 ENCOUNTER — Other Ambulatory Visit: Payer: Self-pay | Admitting: Family Medicine

## 2022-01-15 DIAGNOSIS — Z794 Long term (current) use of insulin: Secondary | ICD-10-CM

## 2022-01-30 ENCOUNTER — Other Ambulatory Visit: Payer: Self-pay | Admitting: Family Medicine

## 2022-01-30 DIAGNOSIS — E1159 Type 2 diabetes mellitus with other circulatory complications: Secondary | ICD-10-CM

## 2022-02-05 ENCOUNTER — Other Ambulatory Visit: Payer: Self-pay | Admitting: Family Medicine

## 2022-02-05 DIAGNOSIS — E1169 Type 2 diabetes mellitus with other specified complication: Secondary | ICD-10-CM

## 2022-02-26 ENCOUNTER — Other Ambulatory Visit: Payer: Self-pay | Admitting: Family Medicine

## 2022-02-26 DIAGNOSIS — Z794 Long term (current) use of insulin: Secondary | ICD-10-CM

## 2022-03-17 ENCOUNTER — Other Ambulatory Visit: Payer: Self-pay | Admitting: Family Medicine

## 2022-03-17 DIAGNOSIS — Z86718 Personal history of other venous thrombosis and embolism: Secondary | ICD-10-CM

## 2022-03-18 DIAGNOSIS — H40033 Anatomical narrow angle, bilateral: Secondary | ICD-10-CM | POA: Diagnosis not present

## 2022-03-18 DIAGNOSIS — E119 Type 2 diabetes mellitus without complications: Secondary | ICD-10-CM | POA: Diagnosis not present

## 2022-03-20 ENCOUNTER — Ambulatory Visit (INDEPENDENT_AMBULATORY_CARE_PROVIDER_SITE_OTHER): Payer: Medicare Other | Admitting: Family Medicine

## 2022-03-20 ENCOUNTER — Encounter: Payer: Self-pay | Admitting: Family Medicine

## 2022-03-20 VITALS — BP 103/57 | HR 85 | Temp 97.7°F | Ht 67.0 in | Wt 228.0 lb

## 2022-03-20 DIAGNOSIS — Z86718 Personal history of other venous thrombosis and embolism: Secondary | ICD-10-CM

## 2022-03-20 DIAGNOSIS — E785 Hyperlipidemia, unspecified: Secondary | ICD-10-CM | POA: Diagnosis not present

## 2022-03-20 DIAGNOSIS — I152 Hypertension secondary to endocrine disorders: Secondary | ICD-10-CM

## 2022-03-20 DIAGNOSIS — E11649 Type 2 diabetes mellitus with hypoglycemia without coma: Secondary | ICD-10-CM | POA: Diagnosis not present

## 2022-03-20 DIAGNOSIS — E1169 Type 2 diabetes mellitus with other specified complication: Secondary | ICD-10-CM

## 2022-03-20 DIAGNOSIS — Z794 Long term (current) use of insulin: Secondary | ICD-10-CM

## 2022-03-20 DIAGNOSIS — E1159 Type 2 diabetes mellitus with other circulatory complications: Secondary | ICD-10-CM | POA: Diagnosis not present

## 2022-03-20 DIAGNOSIS — Z6835 Body mass index (BMI) 35.0-35.9, adult: Secondary | ICD-10-CM

## 2022-03-20 LAB — BAYER DCA HB A1C WAIVED: HB A1C (BAYER DCA - WAIVED): 14 % — ABNORMAL HIGH (ref 4.8–5.6)

## 2022-03-20 MED ORDER — METFORMIN HCL 500 MG PO TABS: ORAL_TABLET | ORAL | 3 refills | Status: DC

## 2022-03-20 MED ORDER — METFORMIN HCL 1000 MG PO TABS: 1000.0000 mg | ORAL_TABLET | Freq: Two times a day (BID) | ORAL | 3 refills | Status: DC

## 2022-03-20 MED ORDER — GLIPIZIDE 5 MG PO TABS: 5.0000 mg | ORAL_TABLET | Freq: Two times a day (BID) | ORAL | 3 refills | Status: DC

## 2022-03-20 MED ORDER — APIXABAN 5 MG PO TABS: 5.0000 mg | ORAL_TABLET | Freq: Two times a day (BID) | ORAL | 3 refills | Status: DC

## 2022-03-20 NOTE — Progress Notes (Signed)
Subjective:  Patient ID: George Anthony, male    DOB: 1950-04-01, 72 y.o.   MRN: [id number]  Patient Care Team: Baruch Gouty, FNP as PCP - General (Family Medicine) Eloise Harman, DO as Consulting Physician (Internal Medicine) Harlen Labs, MD as Referring Physician (Optometry)   Chief Complaint:  Diabetes (3 month follow up )   HPI: George Anthony is a 72 y.o. male presenting on 03/20/2022 for Diabetes (3 month follow up )   1. Type 2 diabetes mellitus with hypoglycemia without coma, with long-term current use of insulin (HCC) Pt is not compliant with medications or diet. He stopped his Rybelsus due to GI upset. Did not call the office to have medications changed to anything else. He has not been checking his blood sugars. He denies polyuria, polyphagia, does have polydipsia.   2. Hypertension associated with type 2 diabetes mellitus ([place]) Complaint with medications. Not compliant with diet and exercise. Denies headaches, chest pain, shortness of breath, weakness, confusion, or syncope. Does have lower extremity swelling.   3. Hyperlipidemia associated with type 2 diabetes mellitus ([place]) On statin therapy and tolerating well. Does not follow a diet or exercise routine.   4. Personal history of DVT (deep vein thrombosis) Complaint with Eliquis without associated side effects. No abnormal bleeding or bruising. No significant fatigue.   5. Morbid obesity ([place]) Does not follow a diet or exercise routine.     Relevant past medical, surgical, family, and social history reviewed and updated as indicated.  Allergies and medications reviewed and updated. Data reviewed: Chart in Epic.   Past Medical History:  Diagnosis Date   Anxiety    Diabetes mellitus without complication ([place])    Diabetic nephropathy associated with type 2 diabetes mellitus ([place]) 02/21/2021   DVT (deep venous thrombosis) ([place])    Enlarged heart    Hyperlipidemia    Hypertension    Renal  cell carcinoma [facility])     Past Surgical History:  Procedure Laterality Date   BIOPSY  09/16/2019   Procedure: BIOPSY;  Surgeon: Eloise Harman, DO;  Location: [facility];  Service: Endoscopy;;   COLONOSCOPY WITH PROPOFOL N/A 09/16/2019   Procedure: COLONOSCOPY WITH PROPOFOL;  Surgeon: Eloise Harman, DO;  Location: [facility];  Service: Endoscopy;  Laterality: N/A;  7:30am   POLYPECTOMY  09/16/2019   Procedure: POLYPECTOMY;  Surgeon: Eloise Harman, DO;  Location: [facility];  Service: Endoscopy;;    Social History   Socioeconomic History   Marital status: Married    Spouse name: charlene   Number of children: 1   Years of education: Not on file   Highest education level: Not on file  Occupational History    Employer: [employer]   Occupation: Retired  Tobacco Use   Smoking status: Every Day    Packs/day: 1.00    Years: 40.00    Total pack years: 40.00    Types: Cigarettes   Smokeless tobacco: Never  Vaping Use   Vaping Use: Never used  Substance and Sexual Activity   Alcohol use: Yes    Alcohol/week: 3.0 standard drinks of alcohol    Types: 3 Cans of beer per week    Comment: drinking a couple times a month for the last year.- used to drink 2 12 packs a week   Drug use: No   Sexual activity: Yes  Other Topics Concern   Not on file  Social History Narrative  Not on file   Social Determinants of Health   Financial Resource Strain: Not on file  Food Insecurity: Not on file  Transportation Needs: Not on file  Physical Activity: Not on file  Stress: Not on file  Social Connections: Not on file  Intimate Partner Violence: Not on file    Outpatient Encounter Medications as of 03/20/2022  Medication Sig   acetaminophen (TYLENOL) 500 MG tablet Take 1,000 mg by mouth every 6 (six) hours as needed for moderate pain.   atorvastatin (LIPITOR) 40 MG tablet TAKE 1 TABLET BY MOUTH AT BEDTIME   CINNAMON PO Take by mouth.   escitalopram (LEXAPRO) 10 MG  tablet Take 1 tablet (10 mg total) by mouth daily.   glipiZIDE (GLUCOTROL) 5 MG tablet Take 1 tablet (5 mg total) by mouth 2 (two) times daily before a meal.   glucose blood test strip Check TID and PRN dx E11.9   lisinopril (ZESTRIL) 10 MG tablet Take 1 tablet by mouth once daily   metFORMIN (GLUCOPHAGE) 1000 MG tablet Take 1 tablet (1,000 mg total) by mouth 2 (two) times daily with a meal.   [DISCONTINUED] ELIQUIS 5 MG TABS tablet Take 1 tablet by mouth twice daily   [DISCONTINUED] metFORMIN (GLUCOPHAGE) 500 MG tablet TAKE 2 TABLETS BY MOUTH ONCE DAILY WITH BREAKFAST THEN TAKE 1 TABLET ONCE DAILY WITH SUPPER   apixaban (ELIQUIS) 5 MG TABS tablet Take 1 tablet (5 mg total) by mouth 2 (two) times daily.   [DISCONTINUED] metFORMIN (GLUCOPHAGE) 500 MG tablet TAKE 2 TABLETS BY MOUTH ONCE DAILY WITH BREAKFAST THEN TAKE 1 TABLET ONCE DAILY WITH SUPPER   [DISCONTINUED] RYBELSUS 7 MG TABS Take 1 tablet by mouth once daily   No facility-administered encounter medications on file as of 03/20/2022.    No Known Allergies  Review of Systems  Constitutional:  Negative for activity change, appetite change, chills, diaphoresis, fatigue, fever and unexpected weight change.  HENT: Negative.    Eyes: Negative.  Negative for photophobia and visual disturbance.  Respiratory:  Negative for cough, chest tightness and shortness of breath.   Cardiovascular:  Positive for leg swelling. Negative for chest pain and palpitations.  Gastrointestinal:  Negative for abdominal pain, blood in stool, constipation, diarrhea, nausea and vomiting.  Endocrine: Positive for polydipsia.  Genitourinary:  Negative for decreased urine volume, difficulty urinating, dysuria, frequency and urgency.  Musculoskeletal:  Negative for arthralgias and myalgias.  Skin: Negative.   Allergic/Immunologic: Negative.   Neurological:  Negative for dizziness, tremors, seizures, syncope, facial asymmetry, speech difficulty, weakness,  light-headedness, numbness and headaches.  Hematological: Negative.   Psychiatric/Behavioral:  Positive for sleep disturbance. Negative for agitation, confusion, decreased concentration, dysphoric mood, hallucinations, self-injury and suicidal ideas. The patient is not nervous/anxious and is not hyperactive.   All other systems reviewed and are negative.       Objective:  BP (!) 103/57   Pulse 85   Temp 97.7 F (36.5 C) (Temporal)   Ht '5\' 7"'$  (1.702 m)   Wt 228 lb (103.4 kg)   SpO2 99%   BMI 35.71 kg/m    Wt Readings from Last 3 Encounters:  03/20/22 228 lb (103.4 kg)  12/19/21 231 lb 9.6 oz (105.1 kg)  09/12/21 230 lb 6.4 oz (104.5 kg)    Physical Exam Vitals and nursing note reviewed.  Constitutional:      General: He is not in acute distress.    Appearance: Normal appearance. He is well-developed and well-groomed. He is morbidly obese. He  is not ill-appearing, toxic-appearing or diaphoretic.  HENT:     Head: Normocephalic and atraumatic.     Jaw: There is normal jaw occlusion.     Right Ear: Hearing normal.     Left Ear: Hearing normal.     Nose: Nose normal.     Mouth/Throat:     Lips: Pink.     Mouth: Mucous membranes are moist.     Pharynx: Oropharynx is clear. Uvula midline.  Eyes:     General: Lids are normal.     Extraocular Movements: Extraocular movements intact.     Conjunctiva/sclera: Conjunctivae normal.     Pupils: Pupils are equal, round, and reactive to light.  Neck:     Thyroid: No thyroid mass, thyromegaly or thyroid tenderness.     Vascular: No carotid bruit or JVD.     Trachea: Trachea and phonation normal.  Cardiovascular:     Rate and Rhythm: Normal rate and regular rhythm.     Chest Wall: PMI is not displaced.     Pulses: Normal pulses.     Heart sounds: Normal heart sounds. No murmur heard.    No friction rub. No gallop.  Pulmonary:     Effort: Pulmonary effort is normal. No respiratory distress.     Breath sounds: Normal breath sounds.  No wheezing.  Abdominal:     General: Abdomen is protuberant. Bowel sounds are normal. There is no distension or abdominal bruit.     Palpations: Abdomen is soft. There is no hepatomegaly or splenomegaly.     Tenderness: There is no abdominal tenderness. There is no right CVA tenderness or left CVA tenderness.     Hernia: No hernia is present.  Musculoskeletal:        General: Normal range of motion.     Cervical back: Normal range of motion and neck supple.     Right lower leg: 1+ Edema present.     Left lower leg: 1+ Edema present.  Lymphadenopathy:     Cervical: No cervical adenopathy.  Skin:    General: Skin is warm and dry.     Capillary Refill: Capillary refill takes less than 2 seconds.     Coloration: Skin is not cyanotic, jaundiced or pale.     Findings: No rash.  Neurological:     General: No focal deficit present.     Mental Status: He is alert and oriented to person, place, and time.     Sensory: Sensation is intact.     Motor: Motor function is intact.     Coordination: Coordination is intact.     Gait: Gait is intact.     Deep Tendon Reflexes: Reflexes are normal and symmetric.  Psychiatric:        Attention and Perception: Attention and perception normal.        Mood and Affect: Mood and affect normal.        Speech: Speech normal.        Behavior: Behavior normal. Behavior is cooperative.        Thought Content: Thought content normal.        Cognition and Memory: Cognition and memory normal.        Judgment: Judgment normal.     Results for orders placed or performed in visit on 12/24/21  Potassium  Result Value Ref Range   Potassium 4.9 3.5 - 5.2 mmol/L       Pertinent labs & imaging results that were available during my care of  the patient were reviewed by me and considered in my medical decision making.  Assessment & Plan:  Lamario was seen today for diabetes.  Diagnoses and all orders for this visit:  Type 2 diabetes mellitus with hypoglycemia  without coma, with long-term current use of insulin (HCC) A1C greater than 14. Has not been compliant with diet, exercise, or medications. Will increase metformin dosing and add Glipizide today as pt could not tolerate Rybelsus. Pt aware if A1C remains elevated, will refer to endocrinology.  -     CBC with Differential/Platelet -     CMP14+EGFR -     Lipid panel -     Bayer DCA Hb A1c Waived -     Microalbumin / creatinine urine ratio -     metFORMIN (GLUCOPHAGE) 1000 MG tablet; Take 1 tablet (1,000 mg total) by mouth 2 (two) times daily with a meal. -     glipiZIDE (GLUCOTROL) 5 MG tablet; Take 1 tablet (5 mg total) by mouth 2 (two) times daily before a meal.  Hypertension associated with type 2 diabetes mellitus (HCC) BP well controlled. Changes were not made in regimen today. Goal BP is 130/80. Pt aware to report any persistent high or low readings. DASH diet and exercise encouraged. Exercise at least 150 minutes per week and increase as tolerated. Goal BMI > 25. Stress management encouraged. Avoid nicotine and tobacco product use. Avoid excessive alcohol and NSAID's. Avoid more than 2000 mg of sodium daily. Medications as prescribed. Follow up as scheduled.  -     CBC with Differential/Platelet -     CMP14+EGFR -     Lipid panel -     Microalbumin / creatinine urine ratio  Hyperlipidemia associated with type 2 diabetes mellitus ([place]) Diet encouraged - increase intake of fresh fruits and vegetables, increase intake of lean proteins. Bake, broil, or grill foods. Avoid fried, greasy, and fatty foods. Avoid fast foods. Increase intake of fiber-rich whole grains. Exercise encouraged - at least 150 minutes per week and advance as tolerated.  Goal BMI < 25. Continue medications as prescribed. Follow up in 3-6 months as discussed.  -     CMP14+EGFR -     Lipid panel  Personal history of DVT (deep vein thrombosis) Doing well on below, continue. Pt is high risk for recurrent DVT due to malignancy.   -     apixaban (ELIQUIS) 5 MG TABS tablet; Take 1 tablet (5 mg total) by mouth 2 (two) times daily.  Morbid obesity ([place]) Diet and exercise encouraged. Labs pending.  -     CBC with Differential/Platelet -     CMP14+EGFR -     Lipid panel     Continue all other maintenance medications.  Follow up plan: Return in about 3 months (around 06/18/2022) for DM.   Continue healthy lifestyle choices, including diet (rich in fruits, vegetables, and lean proteins, and low in salt and simple carbohydrates) and exercise (at least 30 minutes of moderate physical activity daily).  Educational handout given for DM  The above assessment and management plan was discussed with the patient. The patient verbalized understanding of and has agreed to the management plan. Patient is aware to call the clinic if they develop any new symptoms or if symptoms persist or worsen. Patient is aware when to return to the clinic for a follow-up visit. Patient educated on when it is appropriate to go to the emergency department.   Monia Pouch, FNP-C [facility] [phone]

## 2022-03-20 NOTE — Patient Instructions (Signed)

## 2022-03-21 ENCOUNTER — Telehealth: Payer: Self-pay | Admitting: Family Medicine

## 2022-03-21 LAB — CMP14+EGFR
ALT: 13 IU/L (ref 0–44)
AST: 8 IU/L (ref 0–40)
Albumin/Globulin Ratio: 1.7 (ref 1.2–2.2)
Albumin: 3.9 g/dL (ref 3.8–4.8)
Alkaline Phosphatase: 138 IU/L — ABNORMAL HIGH (ref 44–121)
BUN/Creatinine Ratio: 13 (ref 10–24)
BUN: 16 mg/dL (ref 8–27)
Bilirubin Total: 0.4 mg/dL (ref 0.0–1.2)
CO2: 23 mmol/L (ref 20–29)
Calcium: 9 mg/dL (ref 8.6–10.2)
Chloride: 98 mmol/L (ref 96–106)
Creatinine, Ser: 1.24 mg/dL (ref 0.76–1.27)
Globulin, Total: 2.3 g/dL (ref 1.5–4.5)
Glucose: 345 mg/dL — ABNORMAL HIGH (ref 70–99)
Potassium: 4.9 mmol/L (ref 3.5–5.2)
Sodium: 135 mmol/L (ref 134–144)
Total Protein: 6.2 g/dL (ref 6.0–8.5)
eGFR: 62 mL/min/{1.73_m2} (ref >59)

## 2022-03-21 LAB — CBC WITH DIFFERENTIAL/PLATELET
Basophils Absolute: 0.1 10*3/uL (ref 0.0–0.2)
Basos: 0 %
EOS (ABSOLUTE): 0.2 10*3/uL (ref 0.0–0.4)
Eos: 1 %
Hematocrit: 37 % — ABNORMAL LOW (ref 37.5–51.0)
Hemoglobin: 12 g/dL — ABNORMAL LOW (ref 13.0–17.7)
Immature Grans (Abs): 0.1 10*3/uL (ref 0.0–0.1)
Immature Granulocytes: 1 %
Lymphocytes Absolute: 13.8 10*3/uL — ABNORMAL HIGH (ref 0.7–3.1)
Lymphs: 64 %
MCH: 30.1 pg (ref 26.6–33.0)
MCHC: 32.4 g/dL (ref 31.5–35.7)
MCV: 93 fL (ref 79–97)
Monocytes Absolute: 0.9 10*3/uL (ref 0.1–0.9)
Monocytes: 4 %
Neutrophils Absolute: 6.5 10*3/uL (ref 1.4–7.0)
Neutrophils: 30 %
Platelets: 204 10*3/uL (ref 150–450)
RBC: 3.99 x10E6/uL — ABNORMAL LOW (ref 4.14–5.80)
RDW: 12.3 % (ref 11.6–15.4)
WBC: 21.6 10*3/uL (ref 3.4–10.8)

## 2022-03-21 LAB — LIPID PANEL
Chol/HDL Ratio: 3.5 ratio (ref 0.0–5.0)
Cholesterol, Total: 111 mg/dL (ref 100–199)
HDL: 32 mg/dL — ABNORMAL LOW (ref >39)
LDL Chol Calc (NIH): 50 mg/dL (ref 0–99)
Triglycerides: 174 mg/dL — ABNORMAL HIGH (ref 0–149)
VLDL Cholesterol Cal: 29 mg/dL (ref 5–40)

## 2022-03-21 NOTE — Telephone Encounter (Signed)
Contacted George Anthony to schedule their annual wellness visit. Appointment made for 04/09/2022.  Thank you,  George Anthony,  [facility] Direct Dial ??[phone]

## 2022-03-22 LAB — MICROALBUMIN / CREATININE URINE RATIO
Creatinine, Urine: 56.6 mg/dL
Microalb/Creat Ratio: 100 mg/g creat — ABNORMAL HIGH (ref 0–29)
Microalbumin, Urine: 56.5 ug/mL

## 2022-04-09 ENCOUNTER — Ambulatory Visit (INDEPENDENT_AMBULATORY_CARE_PROVIDER_SITE_OTHER): Payer: Medicare Other

## 2022-04-09 VITALS — Ht 66.0 in | Wt 228.0 lb

## 2022-04-09 DIAGNOSIS — Z0000 Encounter for general adult medical examination without abnormal findings: Secondary | ICD-10-CM | POA: Diagnosis not present

## 2022-04-09 NOTE — Patient Instructions (Signed)
[name] , Thank you for taking time to come for your Medicare Wellness Visit. I appreciate your ongoing commitment to your health goals. Please review the following plan we discussed and let me know if I can assist you in the future.   These are the goals we discussed:  Goals      Exercise 3x per week (30 min per time)        This is a list of the screening recommended for you and due dates:  Health Maintenance  Topic Date Due   Eye exam for diabetics  Never done   Zoster (Shingles) Vaccine (1 of 2) Never done   Flu Shot  04/20/2022*   Hepatitis C Screening: USPSTF Recommendation to screen - Ages 18-79 yo.  06/07/2022*   Pneumonia Vaccine (3 of 3 - PPSV23 or PCV20) 03/20/2023*   Complete foot exam   06/07/2022   Screening for Lung Cancer  07/19/2022   Colon Cancer Screening  09/16/2022   Hemoglobin A1C  09/18/2022   Yearly kidney function blood test for diabetes  03/20/2023   Yearly kidney health urinalysis for diabetes  03/20/2023   Medicare Annual Wellness Visit  04/09/2023   DTaP/Tdap/Td vaccine (3 - Td or Tdap) 04/11/2025   HPV Vaccine  Aged Out   COVID-19 Vaccine  Discontinued   Cologuard (Stool DNA test)  Discontinued  *Topic was postponed. The date shown is not the original due date.    Advanced directives: Advance directive discussed with you today. I have provided a copy for you to complete at home and have notarized. Once this is complete please bring a copy in to our office so we can scan it into your chart.   Conditions/risks identified: Aim for 30 minutes of exercise or brisk walking, 6-8 glasses of water, and 5 servings of fruits and vegetables each day.   Next appointment: Follow up in one year for your annual wellness visit.   Preventive Care 71 Years and Older, Male  Preventive care refers to lifestyle choices and visits with your health care provider that can promote health and wellness. What does preventive care include? A yearly physical exam. This is  also called an annual well check. Dental exams once or twice a year. Routine eye exams. Ask your health care provider how often you should have your eyes checked. Personal lifestyle choices, including: Daily care of your teeth and gums. Regular physical activity. Eating a healthy diet. Avoiding tobacco and drug use. Limiting alcohol use. Practicing safe sex. Taking low doses of aspirin every day. Taking vitamin and mineral supplements as recommended by your health care provider. What happens during an annual well check? The services and screenings done by your health care provider during your annual well check will depend on your age, overall health, lifestyle risk factors, and family history of disease. Counseling  Your health care provider may ask you questions about your: Alcohol use. Tobacco use. Drug use. Emotional well-being. Home and relationship well-being. Sexual activity. Eating habits. History of falls. Memory and ability to understand (cognition). Work and work Statistician. Screening  You may have the following tests or measurements: Height, weight, and BMI. Blood pressure. Lipid and cholesterol levels. These may be checked every 5 years, or more frequently if you are over 30 years old. Skin check. Lung cancer screening. You may have this screening every year starting at age 54 if you have a 30-pack-year history of smoking and currently smoke or have quit within the past 15 years.  Fecal occult blood test (FOBT) of the stool. You may have this test every year starting at age 3. Flexible sigmoidoscopy or colonoscopy. You may have a sigmoidoscopy every 5 years or a colonoscopy every 10 years starting at age 1. Prostate cancer screening. Recommendations will vary depending on your family history and other risks. Hepatitis C blood test. Hepatitis B blood test. Sexually transmitted disease (STD) testing. Diabetes screening. This is done by checking your blood sugar  (glucose) after you have not eaten for a while (fasting). You may have this done every 1-3 years. Abdominal aortic aneurysm (AAA) screening. You may need this if you are a current or former smoker. Osteoporosis. You may be screened starting at age 63 if you are at high risk. Talk with your health care provider about your test results, treatment options, and if necessary, the need for more tests. Vaccines  Your health care provider may recommend certain vaccines, such as: Influenza vaccine. This is recommended every year. Tetanus, diphtheria, and acellular pertussis (Tdap, Td) vaccine. You may need a Td booster every 10 years. Zoster vaccine. You may need this after age 78. Pneumococcal 13-valent conjugate (PCV13) vaccine. One dose is recommended after age 49. Pneumococcal polysaccharide (PPSV23) vaccine. One dose is recommended after age 31. Talk to your health care provider about which screenings and vaccines you need and how often you need them. This information is not intended to replace advice given to you by your health care provider. Make sure you discuss any questions you have with your health care provider. Document Released: 02/02/2015 Document Revised: 09/26/2015 Document Reviewed: 11/07/2014 Elsevier Interactive Patient Education  2017 [place] Prevention in the Home Falls can cause injuries. They can happen to people of all ages. There are many things you can do to make your home safe and to help prevent falls. What can I do on the outside of my home? Regularly fix the edges of walkways and driveways and fix any cracks. Remove anything that might make you trip as you walk through a door, such as a raised step or threshold. Trim any bushes or trees on the path to your home. Use bright outdoor lighting. Clear any walking paths of anything that might make someone trip, such as rocks or tools. Regularly check to see if handrails are loose or broken. Make sure that both  sides of any steps have handrails. Any raised decks and porches should have guardrails on the edges. Have any leaves, snow, or ice cleared regularly. Use sand or salt on walking paths during winter. Clean up any spills in your garage right away. This includes oil or grease spills. What can I do in the bathroom? Use night lights. Install grab bars by the toilet and in the tub and shower. Do not use towel bars as grab bars. Use non-skid mats or decals in the tub or shower. If you need to sit down in the shower, use a plastic, non-slip stool. Keep the floor dry. Clean up any water that spills on the floor as soon as it happens. Remove soap buildup in the tub or shower regularly. Attach bath mats securely with double-sided non-slip rug tape. Do not have throw rugs and other things on the floor that can make you trip. What can I do in the bedroom? Use night lights. Make sure that you have a light by your bed that is easy to reach. Do not use any sheets or blankets that are too big for your bed. They  should not hang down onto the floor. Have a firm chair that has side arms. You can use this for support while you get dressed. Do not have throw rugs and other things on the floor that can make you trip. What can I do in the kitchen? Clean up any spills right away. Avoid walking on wet floors. Keep items that you use a lot in easy-to-reach places. If you need to reach something above you, use a strong step stool that has a grab bar. Keep electrical cords out of the way. Do not use floor polish or wax that makes floors slippery. If you must use wax, use non-skid floor wax. Do not have throw rugs and other things on the floor that can make you trip. What can I do with my stairs? Do not leave any items on the stairs. Make sure that there are handrails on both sides of the stairs and use them. Fix handrails that are broken or loose. Make sure that handrails are as long as the stairways. Check any  carpeting to make sure that it is firmly attached to the stairs. Fix any carpet that is loose or worn. Avoid having throw rugs at the top or bottom of the stairs. If you do have throw rugs, attach them to the floor with carpet tape. Make sure that you have a light switch at the top of the stairs and the bottom of the stairs. If you do not have them, ask someone to add them for you. What else can I do to help prevent falls? Wear shoes that: Do not have high heels. Have rubber bottoms. Are comfortable and fit you well. Are closed at the toe. Do not wear sandals. If you use a stepladder: Make sure that it is fully opened. Do not climb a closed stepladder. Make sure that both sides of the stepladder are locked into place. Ask someone to hold it for you, if possible. Clearly mark and make sure that you can see: Any grab bars or handrails. First and last steps. Where the edge of each step is. Use tools that help you move around (mobility aids) if they are needed. These include: Canes. Walkers. Scooters. Crutches. Turn on the lights when you go into a dark area. Replace any light bulbs as soon as they burn out. Set up your furniture so you have a clear path. Avoid moving your furniture around. If any of your floors are uneven, fix them. If there are any pets around you, be aware of where they are. Review your medicines with your doctor. Some medicines can make you feel dizzy. This can increase your chance of falling. Ask your doctor what other things that you can do to help prevent falls. This information is not intended to replace advice given to you by your health care provider. Make sure you discuss any questions you have with your health care provider. Document Released: 11/02/2008 Document Revised: 06/14/2015 Document Reviewed: 02/10/2014 Elsevier Interactive Patient Education  2017 [employer].

## 2022-04-09 NOTE — Progress Notes (Signed)
Subjective:   George Anthony is a 72 y.o. male who presents for Medicare Annual/Subsequent preventive examination. I connected with  Aneta Mins on 04/09/22 by a audio enabled telemedicine application and verified that I am speaking with the correct person using two identifiers.  Patient Location: Home  Provider Location: Home Office  I discussed the limitations of evaluation and management by telemedicine. The patient expressed understanding and agreed to proceed.  Review of Systems     Cardiac Risk Factors include: advanced age (>32men, >31 women);diabetes mellitus;male gender;dyslipidemia;hypertension     Objective:    Today's Vitals   04/09/22 1357  Weight: 228 lb (103.4 kg)  Height: 5\' 6"  (1.676 m)   Body mass index is 36.8 kg/m.     04/09/2022    2:01 PM 11/21/2020    2:36 PM 08/09/2020   11:20 AM 06/02/2020   12:16 AM 06/01/2020   12:44 PM 09/16/2019    6:46 AM 09/01/2019   11:00 AM  Advanced Directives  Does Patient Have a Medical Advance Directive? No No No  No No No  Would patient like information on creating a medical advance directive? No - Patient declined  No - Patient declined No - Patient declined  No - Patient declined No - Patient declined    Current Medications (verified) Outpatient Encounter Medications as of 04/09/2022  Medication Sig   acetaminophen (TYLENOL) 500 MG tablet Take 1,000 mg by mouth every 6 (six) hours as needed for moderate pain.   apixaban (ELIQUIS) 5 MG TABS tablet Take 1 tablet (5 mg total) by mouth 2 (two) times daily.   atorvastatin (LIPITOR) 40 MG tablet TAKE 1 TABLET BY MOUTH AT BEDTIME   CINNAMON PO Take by mouth.   escitalopram (LEXAPRO) 10 MG tablet Take 1 tablet (10 mg total) by mouth daily.   glipiZIDE (GLUCOTROL) 5 MG tablet Take 1 tablet (5 mg total) by mouth 2 (two) times daily before a meal.   glucose blood test strip Check TID and PRN dx E11.9   lisinopril (ZESTRIL) 10 MG tablet Take 1 tablet by mouth once daily    metFORMIN (GLUCOPHAGE) 1000 MG tablet Take 1 tablet (1,000 mg total) by mouth 2 (two) times daily with a meal.   No facility-administered encounter medications on file as of 04/09/2022.    Allergies (verified) Patient has no known allergies.   History: Past Medical History:  Diagnosis Date   Anxiety    Diabetes mellitus without complication ([place])    Diabetic nephropathy associated with type 2 diabetes mellitus ([place]) 02/21/2021   DVT (deep venous thrombosis) ([place])    Enlarged heart    Hyperlipidemia    Hypertension    Renal cell carcinoma [facility])    Past Surgical History:  Procedure Laterality Date   BIOPSY  09/16/2019   Procedure: BIOPSY;  Surgeon: Eloise Harman, DO;  [facility];  Service: Endoscopy;;   COLONOSCOPY WITH PROPOFOL N/A 09/16/2019   Procedure: COLONOSCOPY WITH PROPOFOL;  Surgeon: Eloise Harman, DO;  Location[facility];  Service: Endoscopy;  Laterality: N/A;  7:30am   POLYPECTOMY  09/16/2019   Procedure: POLYPECTOMY;  Surgeon: Eloise Harman, DO;  [facility]: [facility];  Service: Endoscopy;;   Family History  Problem Relation Age of Onset   Emphysema Mother    Drug abuse Sister    Colon cancer Neg Hx    Social History   Socioeconomic History   Marital status: Married    Spouse name:  charlene   Number of children: 1   Years of education: Not on file   Highest education level: Not on file  Occupational History    Employer: [employer]   Occupation: Retired  Tobacco Use   Smoking status: Every Day    Packs/day: 1.00    Years: 40.00    Additional pack years: 0.00    Total pack years: 40.00    Types: Cigarettes   Smokeless tobacco: Never  Vaping Use   Vaping Use: Never used  Substance and Sexual Activity   Alcohol use: Yes    Alcohol/week: 3.0 standard drinks of alcohol    Types: 3 Cans of beer per week    Comment: drinking a couple times a month for the last year.- used to drink 2 12 packs a week   Drug use: No    Sexual activity: Yes  Other Topics Concern   Not on file  Social History Narrative   Not on file   Social Determinants of Health   Financial Resource Strain: Low Risk  (04/09/2022)   Overall Financial Resource Strain (CARDIA)    Difficulty of Paying Living Expenses: Not hard at all  Food Insecurity: No Food Insecurity (04/09/2022)   Hunger Vital Sign    Worried About Running Out of Food in the Last Year: Never true    Ran Out of Food in the Last Year: Never true  Transportation Needs: No Transportation Needs (04/09/2022)   PRAPARE - Hydrologist (Medical): No    Lack of Transportation (Non-Medical): No  Physical Activity: Inactive (04/09/2022)   Exercise Vital Sign    Days of Exercise per Week: 0 days    Minutes of Exercise per Session: 0 min  Stress: No Stress Concern Present (04/09/2022)   [place]    Feeling of Stress : Not at all  Social Connections: Moderately Isolated (04/09/2022)   Social Connection and Isolation Panel [NHANES]    Frequency of Communication with Friends and Family: More than three times a week    Frequency of Social Gatherings with Friends and Family: More than three times a week    Attends Religious Services: Never    Marine scientist or Organizations: No    Attends Music therapist: Never    Marital Status: Married    Tobacco Counseling Ready to quit: No Counseling given: Not Answered   Clinical Intake:  Pre-visit preparation completed: Yes  Pain : No/denies pain     Nutritional Risks: None Diabetes: Yes CBG done?: No Did pt. bring in CBG monitor from home?: No  How often do you need to have someone help you when you read instructions, pamphlets, or other written materials from your doctor or pharmacy?: 1 - Never  Diabetic?yes  Nutrition Risk Assessment:  Has the patient had any N/V/D within the last 2 months?  No  Does the  patient have any non-healing wounds?  No  Has the patient had any unintentional weight loss or weight gain?  No   Diabetes:  Is the patient diabetic?  Yes  If diabetic, was a CBG obtained today?  No  Did the patient bring in their glucometer from home?  No  How often do you monitor your CBG's? Once a week .   Financial Strains and Diabetes Management:  Are you having any financial strains with the device, your supplies or your medication? No .  Does the patient  want to be seen by Chronic Care Management for management of their diabetes?  No  Would the patient like to be referred to a Nutritionist or for Diabetic Management?  No   Diabetic Exams:  Diabetic Eye Exam: Completed 03/2022 Diabetic Foot Exam: Overdue, Pt has been advised about the importance in completing this exam. Pt is scheduled for diabetic foot exam on next office visit .   Interpreter Needed?: No  Information entered by :: Jadene Pierini, LPN   Activities of Daily Living    04/09/2022    2:01 PM  In your present state of health, do you have any difficulty performing the following activities:  Hearing? 0  Vision? 0  Difficulty concentrating or making decisions? 0  Walking or climbing stairs? 0  Dressing or bathing? 0  Doing errands, shopping? 0  Preparing Food and eating ? N  Using the Toilet? N  In the past six months, have you accidently leaked urine? N  Do you have problems with loss of bowel control? N  Managing your Medications? N  Managing your Finances? N  Housekeeping or managing your Housekeeping? N    Patient Care Team: Baruch Gouty, FNP as PCP - General (Family Medicine) Eloise Harman, DO as Consulting Physician (Internal Medicine) Harlen Labs, MD as Referring Physician (Optometry)  Indicate any recent Medical Services you may have received from other than [facility] providers in the past year (date may be approximate).     Assessment:   This is a routine wellness examination for  Romeo.  Hearing/Vision screen Vision Screening - Comments:: Wears rx glasses - up to date with routine eye exams with  Dr.Lee   Dietary issues and exercise activities discussed: Current Exercise Habits: The patient does not participate in regular exercise at present, Exercise limited by: None identified   Goals Addressed             This Visit's Progress    Exercise 3x per week (30 min per time)         Depression Screen    04/09/2022    2:00 PM 03/20/2022    8:23 AM 12/19/2021    9:29 AM 12/19/2021    8:59 AM 09/12/2021    9:48 AM 06/06/2021    8:33 AM 02/21/2021    9:08 AM  PHQ 2/9 Scores  PHQ - 2 Score 0 0 0 0 0 0 0  PHQ- 9 Score 0 0 5 0 0 2 3    Fall Risk    04/09/2022    1:58 PM 03/20/2022    8:23 AM 12/19/2021    9:29 AM 12/19/2021    8:59 AM 09/12/2021    9:48 AM  Fall Risk   Falls in the past year? 0 0 0 0 0  Number falls in past yr: 0      Injury with Fall? 0      Risk for fall due to : No Fall Risks      Follow up Falls prevention discussed        [place]:  Any stairs in or around the home? No  If so, are there any without handrails? No  Home free of loose throw rugs in walkways, pet beds, electrical cords, etc? Yes  Adequate lighting in your home to reduce risk of falls? Yes   ASSISTIVE DEVICES UTILIZED TO PREVENT FALLS:  Life alert? No  Use of a cane, walker or w/c? No  Grab bars in the bathroom? No  Shower chair or bench in shower? No  Elevated toilet seat or a handicapped toilet? No        04/09/2022    2:03 PM  6CIT Screen  What Year? 0 points  What month? 0 points  What time? 0 points  Count back from 20 0 points  Months in reverse 0 points  Repeat phrase 0 points  Total Score 0 points    Immunizations Immunization History  Administered Date(s) Administered   Moderna Sars-Covid-2 Vaccination 09/16/2019, 10/28/2019   Pneumococcal Conjugate-13 09/11/2016   Pneumococcal Polysaccharide-23  08/02/2015   Tdap 09/12/2006, 04/12/2015    TDAP status: Up to date  Flu Vaccine status: Declined, Education has been provided regarding the importance of this vaccine but patient still declined. Advised may receive this vaccine at local pharmacy or Health Dept. Aware to provide a copy of the vaccination record if obtained from local pharmacy or Health Dept. Verbalized acceptance and understanding.  Pneumococcal vaccine status: Up to date  Covid-19 vaccine status: Completed vaccines  Qualifies for Shingles Vaccine? Yes   Zostavax completed No   Shingrix Completed?: No.    Education has been provided regarding the importance of this vaccine. Patient has been advised to call insurance company to determine out of pocket expense if they have not yet received this vaccine. Advised may also receive vaccine at local pharmacy or Health Dept. Verbalized acceptance and understanding.  Screening Tests Health Maintenance  Topic Date Due   OPHTHALMOLOGY EXAM  Never done   Zoster Vaccines- Shingrix (1 of 2) Never done   INFLUENZA VACCINE  04/20/2022 (Originally 08/20/2021)   Hepatitis C Screening  06/07/2022 (Originally 03/17/1968)   Pneumonia Vaccine 15+ Years old (89 of 3 - PPSV23 or PCV20) 03/20/2023 (Originally 08/01/2020)   FOOT EXAM  06/07/2022   Lung Cancer Screening  07/19/2022   COLONOSCOPY (Pts 45-66yrs Insurance coverage will need to be confirmed)  09/16/2022   HEMOGLOBIN A1C  09/18/2022   Diabetic kidney evaluation - eGFR measurement  03/20/2023   Diabetic kidney evaluation - Urine ACR  03/20/2023   Medicare Annual Wellness (AWV)  04/09/2023   DTaP/Tdap/Td (3 - Td or Tdap) 04/11/2025   HPV VACCINES  Aged Out   COVID-19 Vaccine  Discontinued   Fecal DNA (Cologuard)  Discontinued    Health Maintenance  Health Maintenance Due  Topic Date Due   OPHTHALMOLOGY EXAM  Never done   Zoster Vaccines- Shingrix (1 of 2) Never done    Colorectal cancer screening: Type of screening:  Colonoscopy. Completed 09/16/2019. Repeat every 3 years  Lung Cancer Screening: (Low Dose CT Chest recommended if Age 57-80 years, 30 pack-year currently smoking OR have quit w/in 15years.) does qualify.   Lung Cancer Screening Referral: declined   Additional Screening:  Hepatitis C Screening: does not qualify;   Vision Screening: Recommended annual ophthalmology exams for early detection of glaucoma and other disorders of the eye. Is the patient up to date with their annual eye exam?  Yes  Who is the provider or what is the name of the office in which the patient attends annual eye exams? Dr.Lee  If pt is not established with a provider, would they like to be referred to a provider to establish care? No .   Dental Screening: Recommended annual dental exams for proper oral hygiene  Community Resource Referral / Chronic Care Management: CRR required this visit?  No   CCM required this visit?  No  Plan:     I have personally reviewed and noted the following in the patient's chart:   Medical and social history Use of alcohol, tobacco or illicit drugs  Current medications and supplements including opioid prescriptions. Patient is not currently taking opioid prescriptions. Functional ability and status Nutritional status Physical activity Advanced directives List of other physicians Hospitalizations, surgeries, and ER visits in previous 12 months Vitals Screenings to include cognitive, depression, and falls Referrals and appointments  In addition, I have reviewed and discussed with patient certain preventive protocols, quality metrics, and best practice recommendations. A written personalized care plan for preventive services as well as general preventive health recommendations were provided to patient.     Daphane Shepherd, LPN   [id number]   Nurse Notes: Will discuss lung cancer screening with Pcp

## 2022-04-10 DIAGNOSIS — K7689 Other specified diseases of liver: Secondary | ICD-10-CM | POA: Diagnosis not present

## 2022-04-10 DIAGNOSIS — C641 Malignant neoplasm of right kidney, except renal pelvis: Secondary | ICD-10-CM | POA: Diagnosis not present

## 2022-04-10 DIAGNOSIS — R911 Solitary pulmonary nodule: Secondary | ICD-10-CM | POA: Diagnosis not present

## 2022-04-10 DIAGNOSIS — Z905 Acquired absence of kidney: Secondary | ICD-10-CM | POA: Diagnosis not present

## 2022-04-10 DIAGNOSIS — M4854XA Collapsed vertebra, not elsewhere classified, thoracic region, initial encounter for fracture: Secondary | ICD-10-CM | POA: Diagnosis not present

## 2022-04-10 DIAGNOSIS — I7143 Infrarenal abdominal aortic aneurysm, without rupture: Secondary | ICD-10-CM | POA: Diagnosis not present

## 2022-04-10 DIAGNOSIS — C787 Secondary malignant neoplasm of liver and intrahepatic bile duct: Secondary | ICD-10-CM | POA: Diagnosis not present

## 2022-04-23 DIAGNOSIS — C9110 Chronic lymphocytic leukemia of B-cell type not having achieved remission: Secondary | ICD-10-CM | POA: Diagnosis not present

## 2022-04-23 DIAGNOSIS — C641 Malignant neoplasm of right kidney, except renal pelvis: Secondary | ICD-10-CM | POA: Diagnosis not present

## 2022-04-23 DIAGNOSIS — R160 Hepatomegaly, not elsewhere classified: Secondary | ICD-10-CM | POA: Diagnosis not present

## 2022-05-05 ENCOUNTER — Other Ambulatory Visit: Payer: Self-pay | Admitting: Family Medicine

## 2022-05-05 DIAGNOSIS — E1169 Type 2 diabetes mellitus with other specified complication: Secondary | ICD-10-CM

## 2022-05-05 DIAGNOSIS — E1159 Type 2 diabetes mellitus with other circulatory complications: Secondary | ICD-10-CM

## 2022-05-07 DIAGNOSIS — C641 Malignant neoplasm of right kidney, except renal pelvis: Secondary | ICD-10-CM | POA: Diagnosis not present

## 2022-05-26 DIAGNOSIS — E119 Type 2 diabetes mellitus without complications: Secondary | ICD-10-CM | POA: Diagnosis not present

## 2022-05-26 DIAGNOSIS — K7689 Other specified diseases of liver: Secondary | ICD-10-CM | POA: Diagnosis not present

## 2022-05-26 DIAGNOSIS — C641 Malignant neoplasm of right kidney, except renal pelvis: Secondary | ICD-10-CM | POA: Diagnosis not present

## 2022-05-26 DIAGNOSIS — C9110 Chronic lymphocytic leukemia of B-cell type not having achieved remission: Secondary | ICD-10-CM | POA: Diagnosis not present

## 2022-06-02 DIAGNOSIS — C9110 Chronic lymphocytic leukemia of B-cell type not having achieved remission: Secondary | ICD-10-CM | POA: Diagnosis not present

## 2022-06-02 DIAGNOSIS — C641 Malignant neoplasm of right kidney, except renal pelvis: Secondary | ICD-10-CM | POA: Diagnosis not present

## 2022-06-11 DIAGNOSIS — R590 Localized enlarged lymph nodes: Secondary | ICD-10-CM | POA: Diagnosis not present

## 2022-06-11 DIAGNOSIS — E119 Type 2 diabetes mellitus without complications: Secondary | ICD-10-CM | POA: Diagnosis not present

## 2022-06-11 DIAGNOSIS — C9110 Chronic lymphocytic leukemia of B-cell type not having achieved remission: Secondary | ICD-10-CM | POA: Diagnosis not present

## 2022-06-11 DIAGNOSIS — J984 Other disorders of lung: Secondary | ICD-10-CM | POA: Diagnosis not present

## 2022-06-11 DIAGNOSIS — C641 Malignant neoplasm of right kidney, except renal pelvis: Secondary | ICD-10-CM | POA: Diagnosis not present

## 2022-06-11 DIAGNOSIS — K769 Liver disease, unspecified: Secondary | ICD-10-CM | POA: Diagnosis not present

## 2022-06-17 DIAGNOSIS — C641 Malignant neoplasm of right kidney, except renal pelvis: Secondary | ICD-10-CM | POA: Diagnosis not present

## 2022-06-17 DIAGNOSIS — Z5112 Encounter for antineoplastic immunotherapy: Secondary | ICD-10-CM | POA: Diagnosis not present

## 2022-06-19 ENCOUNTER — Ambulatory Visit (INDEPENDENT_AMBULATORY_CARE_PROVIDER_SITE_OTHER): Payer: Medicare Other | Admitting: Family Medicine

## 2022-06-19 ENCOUNTER — Encounter: Payer: Self-pay | Admitting: Family Medicine

## 2022-06-19 VITALS — BP 148/76 | HR 93 | Temp 96.8°F | Ht 66.0 in | Wt 230.6 lb

## 2022-06-19 DIAGNOSIS — C641 Malignant neoplasm of right kidney, except renal pelvis: Secondary | ICD-10-CM | POA: Diagnosis not present

## 2022-06-19 DIAGNOSIS — E875 Hyperkalemia: Secondary | ICD-10-CM

## 2022-06-19 DIAGNOSIS — R600 Localized edema: Secondary | ICD-10-CM

## 2022-06-19 DIAGNOSIS — I152 Hypertension secondary to endocrine disorders: Secondary | ICD-10-CM | POA: Diagnosis not present

## 2022-06-19 DIAGNOSIS — E785 Hyperlipidemia, unspecified: Secondary | ICD-10-CM

## 2022-06-19 DIAGNOSIS — E1159 Type 2 diabetes mellitus with other circulatory complications: Secondary | ICD-10-CM

## 2022-06-19 DIAGNOSIS — Z794 Long term (current) use of insulin: Secondary | ICD-10-CM

## 2022-06-19 DIAGNOSIS — E11649 Type 2 diabetes mellitus with hypoglycemia without coma: Secondary | ICD-10-CM | POA: Diagnosis not present

## 2022-06-19 DIAGNOSIS — E1169 Type 2 diabetes mellitus with other specified complication: Secondary | ICD-10-CM | POA: Diagnosis not present

## 2022-06-19 LAB — CMP14+EGFR
AST: 8 IU/L (ref 0–40)
Chloride: 102 mmol/L (ref 96–106)
Creatinine, Ser: 1.1 mg/dL (ref 0.76–1.27)
Potassium: 5.7 mmol/L — ABNORMAL HIGH (ref 3.5–5.2)
Sodium: 141 mmol/L (ref 134–144)

## 2022-06-19 LAB — LIPID PANEL: Chol/HDL Ratio: 3.1 ratio (ref 0.0–5.0)

## 2022-06-19 LAB — CBC WITH DIFFERENTIAL/PLATELET

## 2022-06-19 LAB — BAYER DCA HB A1C WAIVED: HB A1C (BAYER DCA - WAIVED): 7.9 % — ABNORMAL HIGH (ref 4.8–5.6)

## 2022-06-19 MED ORDER — GLIPIZIDE 5 MG PO TABS
5.0000 mg | ORAL_TABLET | Freq: Two times a day (BID) | ORAL | 3 refills | Status: DC
Start: 2022-06-19 — End: 2022-10-16

## 2022-06-19 MED ORDER — FUROSEMIDE 40 MG PO TABS
40.0000 mg | ORAL_TABLET | Freq: Every day | ORAL | 3 refills | Status: DC | PRN
Start: 2022-06-19 — End: 2022-09-28

## 2022-06-19 NOTE — Progress Notes (Signed)
Subjective:  Patient ID: George Anthony, male    DOB: 10-31-1950, 72 y.o.   MRN: [id number]  Patient Care Team: Sonny Masters, FNP as PCP - General (Family Medicine) Lanelle Bal, DO as Consulting Physician (Internal Medicine) Michaelle Copas, MD as Referring Physician (Optometry)   Chief Complaint:  Diabetes (3 month follow up )   HPI: George Anthony is a 72 y.o. male presenting on 06/19/2022 for Diabetes (3 month follow up )   1. Type 2 diabetes mellitus with hypoglycemia without coma, with long-term current use of insulin (HCC) Has been taking medications as prescribed and has adjusted diet. Denies polyuria, polyphagia, or polydipsia. No side effects from medications. BS this morning was 90.  2. Hypertension associated with type 2 diabetes mellitus (HCC) Complaint with meds - Yes Current Medications - lisinopril Checking BP at home - no Exercising Regularly - No Watching Salt intake - No Pertinent ROS:  Headache - No Fatigue - Yes Visual Disturbances - No Chest pain - No Dyspnea - No Palpitations - No LE edema - Yes They report good compliance with medications and can restate their regimen by memory. No medication side effects.  BP Readings from Last 3 Encounters:  06/19/22 (!) 148/76  03/20/22 (!) 103/57  12/19/21 138/83     3. Hyperlipidemia associated with type 2 diabetes mellitus (HCC) Compliant with medications - Yes Current medications - atorvastatin Side effects from medications - No Diet - regular  Exercise - none   4. Renal cell carcinoma, right (HCC) Return of cancer. Had first infusion this month and tolerated will. Has next infusion scheduled for 07/07/2022. States he will undergo infusions every 6 weeks. States he does have some fatigue but denies any other side effects from treatments.       Relevant past medical, surgical, family, and social history reviewed and updated as indicated.  Allergies and medications reviewed and  updated. Data reviewed: Chart in Epic.   Past Medical History:  Diagnosis Date   Anxiety    Diabetes mellitus without complication (HCC)    Diabetic nephropathy associated with type 2 diabetes mellitus (HCC) 02/21/2021   DVT (deep venous thrombosis) (HCC)    Enlarged heart    Hyperlipidemia    Hypertension    Renal cell carcinoma [facility])     Past Surgical History:  Procedure Laterality Date   BIOPSY  09/16/2019   Procedure: BIOPSY;  Surgeon: Lanelle Bal, DO;  Location: [facility];  Service: Endoscopy;;   COLONOSCOPY WITH PROPOFOL N/A 09/16/2019   Procedure: COLONOSCOPY WITH PROPOFOL;  Surgeon: Lanelle Bal, DO;  Location: [facility];  Service: Endoscopy;  Laterality: N/A;  7:30am   POLYPECTOMY  09/16/2019   Procedure: POLYPECTOMY;  Surgeon: Lanelle Bal, DO;  Location: [facility];  Service: Endoscopy;;    Social History   Socioeconomic History   Marital status: Married    Spouse name: charlene   Number of children: 1   Years of education: Not on file   Highest education level: Not on file  Occupational History    Employer: [employer]   Occupation: Retired  Tobacco Use   Smoking status: Every Day    Packs/day: 1.00    Years: 40.00    Additional pack years: 0.00    Total pack years: 40.00    Types: Cigarettes   Smokeless tobacco: Never  Vaping Use   Vaping Use: Never used  Substance and Sexual Activity  Alcohol use: Yes    Alcohol/week: 3.0 standard drinks of alcohol    Types: 3 Cans of beer per week    Comment: drinking a couple times a month for the last year.- used to drink 2 12 packs a week   Drug use: No   Sexual activity: Yes  Other Topics Concern   Not on file  Social History Narrative   Not on file   Social Determinants of Health   Financial Resource Strain: Low Risk  (04/09/2022)   Overall Financial Resource Strain (CARDIA)    Difficulty of Paying Living Expenses: Not hard at all  Food Insecurity: No Food Insecurity  (04/09/2022)   Hunger Vital Sign    Worried About Running Out of Food in the Last Year: Never true    Ran Out of Food in the Last Year: Never true  Transportation Needs: No Transportation Needs (04/09/2022)   PRAPARE - Administrator, Civil Service (Medical): No    Lack of Transportation (Non-Medical): No  Physical Activity: Inactive (04/09/2022)   Exercise Vital Sign    Days of Exercise per Week: 0 days    Minutes of Exercise per Session: 0 min  Stress: No Stress Concern Present (04/09/2022)   [employer] of Occupational Health - Occupational Stress Questionnaire    Feeling of Stress : Not at all  Social Connections: Moderately Isolated (04/09/2022)   Social Connection and Isolation Panel [NHANES]    Frequency of Communication with Friends and Family: More than three times a week    Frequency of Social Gatherings with Friends and Family: More than three times a week    Attends Religious Services: Never    Database administrator or Organizations: No    Attends Banker Meetings: Never    Marital Status: Married  Catering manager Violence: Not At Risk (04/09/2022)   Humiliation, Afraid, Rape, and Kick questionnaire    Fear of Current or Ex-Partner: No    Emotionally Abused: No    Physically Abused: No    Sexually Abused: No    Outpatient Encounter Medications as of 06/19/2022  Medication Sig   acetaminophen (TYLENOL) 500 MG tablet Take 1,000 mg by mouth every 6 (six) hours as needed for moderate pain.   apixaban (ELIQUIS) 5 MG TABS tablet Take 1 tablet (5 mg total) by mouth 2 (two) times daily.   atorvastatin (LIPITOR) 40 MG tablet TAKE 1 TABLET BY MOUTH AT BEDTIME   CINNAMON PO Take by mouth.   escitalopram (LEXAPRO) 10 MG tablet Take 1 tablet (10 mg total) by mouth daily.   furosemide (LASIX) 40 MG tablet Take 1 tablet (40 mg total) by mouth daily as needed for edema.   glucose blood test strip Check TID and PRN dx E11.9   lisinopril (ZESTRIL) 10 MG  tablet Take 1 tablet by mouth once daily   metFORMIN (GLUCOPHAGE) 1000 MG tablet Take 1 tablet (1,000 mg total) by mouth 2 (two) times daily with a meal.   [DISCONTINUED] glipiZIDE (GLUCOTROL) 5 MG tablet Take 1 tablet (5 mg total) by mouth 2 (two) times daily before a meal.   glipiZIDE (GLUCOTROL) 5 MG tablet Take 1 tablet (5 mg total) by mouth 2 (two) times daily before a meal.   No facility-administered encounter medications on file as of 06/19/2022.    No Known Allergies  Review of Systems  Constitutional:  Negative for activity change, appetite change, chills, diaphoresis, fatigue, fever and unexpected weight change.  HENT: Negative.  Eyes: Negative.  Negative for photophobia and visual disturbance.  Respiratory:  Negative for cough, chest tightness and shortness of breath.   Cardiovascular:  Positive for leg swelling. Negative for chest pain and palpitations.  Gastrointestinal:  Negative for abdominal pain, blood in stool, constipation, diarrhea, nausea and vomiting.  Endocrine: Negative.  Negative for polydipsia and polyphagia.  Genitourinary:  Negative for decreased urine volume, difficulty urinating, dysuria, frequency and urgency.  Musculoskeletal:  Negative for arthralgias and myalgias.  Skin: Negative.   Allergic/Immunologic: Negative.   Neurological:  Negative for dizziness, tremors, seizures, syncope, facial asymmetry, speech difficulty, weakness, light-headedness, numbness and headaches.  Hematological: Negative.   Psychiatric/Behavioral:  Negative for confusion, hallucinations, sleep disturbance and suicidal ideas.   All other systems reviewed and are negative.       Objective:  BP (!) 148/76   Pulse 93   Temp (!) 96.8 F (36 C) (Temporal)   Ht 5\' 6"  (1.676 m)   Wt 230 lb 9.6 oz (104.6 kg)   SpO2 93%   BMI 37.22 kg/m    Wt Readings from Last 3 Encounters:  06/19/22 230 lb 9.6 oz (104.6 kg)  04/09/22 228 lb (103.4 kg)  03/20/22 228 lb (103.4 kg)     Physical Exam Vitals and nursing note reviewed.  Constitutional:      General: He is not in acute distress.    Appearance: Normal appearance. He is well-developed and well-groomed. He is morbidly obese. He is not ill-appearing, toxic-appearing or diaphoretic.  HENT:     Head: Normocephalic and atraumatic.     Jaw: There is normal jaw occlusion.     Right Ear: Hearing normal.     Left Ear: Hearing normal.     Nose: Nose normal.     Mouth/Throat:     Lips: Pink.     Mouth: Mucous membranes are moist.     Pharynx: Oropharynx is clear. Uvula midline.  Eyes:     General: Lids are normal.     Extraocular Movements: Extraocular movements intact.     Conjunctiva/sclera: Conjunctivae normal.     Pupils: Pupils are equal, round, and reactive to light.  Neck:     Thyroid: No thyroid mass, thyromegaly or thyroid tenderness.     Vascular: No carotid bruit or JVD.     Trachea: Trachea and phonation normal.  Cardiovascular:     Rate and Rhythm: Normal rate and regular rhythm.     Chest Wall: PMI is not displaced.     Pulses: Normal pulses.          Dorsalis pedis pulses are 2+ on the right side and 2+ on the left side.       Posterior tibial pulses are 2+ on the right side and 2+ on the left side.     Heart sounds: Normal heart sounds. No murmur heard.    No friction rub. No gallop.  Pulmonary:     Effort: Pulmonary effort is normal. No respiratory distress.     Breath sounds: Normal breath sounds. No wheezing.  Abdominal:     General: Abdomen is protuberant. Bowel sounds are normal. There is no distension or abdominal bruit.     Palpations: Abdomen is soft. There is no hepatomegaly or splenomegaly.     Tenderness: There is no abdominal tenderness. There is no right CVA tenderness or left CVA tenderness.     Hernia: No hernia is present.  Musculoskeletal:        General: Normal range of  motion.     Cervical back: Normal range of motion and neck supple.     Right lower leg: 2+  Pitting Edema present.     Left lower leg: 2+ Pitting Edema present.  Feet:     Right foot:     Protective Sensation: 10 sites tested.  10 sites sensed.     Skin integrity: Callus, dry skin and fissure present.     Toenail Condition: Right toenails are abnormally thick and long.     Left foot:     Protective Sensation: 10 sites tested.  10 sites sensed.     Skin integrity: Callus, dry skin and fissure present.     Toenail Condition: Left toenails are abnormally thick and long.  Lymphadenopathy:     Cervical: No cervical adenopathy.  Skin:    General: Skin is warm and dry.     Capillary Refill: Capillary refill takes less than 2 seconds.     Coloration: Skin is not cyanotic, jaundiced or pale.     Findings: No rash.  Neurological:     General: No focal deficit present.     Mental Status: He is alert and oriented to person, place, and time.     Sensory: Sensation is intact.     Motor: Motor function is intact.     Coordination: Coordination is intact.     Gait: Gait is intact.     Deep Tendon Reflexes: Reflexes are normal and symmetric.  Psychiatric:        Attention and Perception: Attention and perception normal.        Mood and Affect: Mood and affect normal.        Speech: Speech normal.        Behavior: Behavior normal. Behavior is cooperative.        Thought Content: Thought content normal.        Cognition and Memory: Cognition and memory normal.        Judgment: Judgment normal.     Results for orders placed or performed in visit on 03/20/22  CBC with Differential/Platelet  Result Value Ref Range   WBC 21.6 (HH) 3.4 - 10.8 x10E3/uL   RBC 3.99 (L) 4.14 - 5.80 x10E6/uL   Hemoglobin 12.0 (L) 13.0 - 17.7 g/dL   Hematocrit 16.1 (L) 09.6 - 51.0 %   MCV 93 79 - 97 fL   MCH 30.1 26.6 - 33.0 pg   MCHC 32.4 31.5 - 35.7 g/dL   RDW 04.5 40.9 - 81.1 %   Platelets 204 150 - 450 x10E3/uL   Neutrophils 30 Not Estab. %   Lymphs 64 Not Estab. %   Monocytes 4 Not Estab. %   Eos  1 Not Estab. %   Basos 0 Not Estab. %   Neutrophils Absolute 6.5 1.4 - 7.0 x10E3/uL   Lymphocytes Absolute 13.8 (H) 0.7 - 3.1 x10E3/uL   Monocytes Absolute 0.9 0.1 - 0.9 x10E3/uL   EOS (ABSOLUTE) 0.2 0.0 - 0.4 x10E3/uL   Basophils Absolute 0.1 0.0 - 0.2 x10E3/uL   Immature Granulocytes 1 Not Estab. %   Immature Grans (Abs) 0.1 0.0 - 0.1 x10E3/uL   Hematology Comments: Note:   CMP14+EGFR  Result Value Ref Range   Glucose 345 (H) 70 - 99 mg/dL   BUN 16 8 - 27 mg/dL   Creatinine, Ser 9.14 0.76 - 1.27 mg/dL   eGFR 62 >78 GN/FAO/1.30   BUN/Creatinine Ratio 13 10 - 24   Sodium 135 134 - 144  mmol/L   Potassium 4.9 3.5 - 5.2 mmol/L   Chloride 98 96 - 106 mmol/L   CO2 23 20 - 29 mmol/L   Calcium 9.0 8.6 - 10.2 mg/dL   Total Protein 6.2 6.0 - 8.5 g/dL   Albumin 3.9 3.8 - 4.8 g/dL   Globulin, Total 2.3 1.5 - 4.5 g/dL   Albumin/Globulin Ratio 1.7 1.2 - 2.2   Bilirubin Total 0.4 0.0 - 1.2 mg/dL   Alkaline Phosphatase 138 (H) 44 - 121 IU/L   AST 8 0 - 40 IU/L   ALT 13 0 - 44 IU/L  Lipid panel  Result Value Ref Range   Cholesterol, Total 111 100 - 199 mg/dL   Triglycerides 161 (H) 0 - 149 mg/dL   HDL 32 (L) >09 mg/dL   VLDL Cholesterol Cal 29 5 - 40 mg/dL   LDL Chol Calc (NIH) 50 0 - 99 mg/dL   Chol/HDL Ratio 3.5 0.0 - 5.0 ratio  Bayer DCA Hb A1c Waived  Result Value Ref Range   HB A1C (BAYER DCA - WAIVED) 14.0 (H) 4.8 - 5.6 %  Microalbumin / creatinine urine ratio  Result Value Ref Range   Creatinine, Urine 56.6 Not Estab. mg/dL   Microalbumin, Urine 60.4 Not Estab. ug/mL   Microalb/Creat Ratio 100 (H) 0 - 29 mg/g creat       Pertinent labs & imaging results that were available during my care of the patient were reviewed by me and considered in my medical decision making.  Assessment & Plan:  Kallin was seen today for diabetes.  Diagnoses and all orders for this visit:  Type 2 diabetes mellitus with hypoglycemia without coma, with long-term current use of insulin (HCC) A1C  has improved significantly, was greater than 14, now 7.9. will continue with current medications. Diet and exercise encouraged.  -     Bayer DCA Hb A1c Waived -     glipiZIDE (GLUCOTROL) 5 MG tablet; Take 1 tablet (5 mg total) by mouth 2 (two) times daily before a meal. -     Ambulatory referral to Podiatry  Hypertension associated with type 2 diabetes mellitus (HCC) BP fairly controlled. Changes were not made in regimen today. Goal BP is 130/80. Pt aware to report any persistent high or low readings. DASH diet and exercise encouraged. Exercise at least 150 minutes per week and increase as tolerated. Goal BMI > 25. Stress management encouraged. Avoid nicotine and tobacco product use. Avoid excessive alcohol and NSAID's. Avoid more than 2000 mg of sodium daily. Medications as prescribed. Follow up as scheduled. -     CBC with Differential/Platelet -     CMP14+EGFR  Hyperlipidemia associated with type 2 diabetes mellitus (HCC) Diet encouraged - increase intake of fresh fruits and vegetables, increase intake of lean proteins. Bake, broil, or grill foods. Avoid fried, greasy, and fatty foods. Avoid fast foods. Increase intake of fiber-rich whole grains. Exercise encouraged - at least 150 minutes per week and advance as tolerated.  Goal BMI < 25. Continue medications as prescribed. Follow up in 3-6 months as discussed.  -     Lipid panel  Renal cell carcinoma, right (HCC) Cancer has returned and he has started treatments. Did well with first infusion.   Bilateral lower extremity edema Ongoing swelling, slightly worse than before. Will add Lasix as needed for swelling. Pt aware of red flags which require emergent evaluation and treatment.  -     furosemide (LASIX) 40 MG tablet; Take 1 tablet (40  mg total) by mouth daily as needed for edema.     Continue all other maintenance medications.  Follow up plan: Return in about 3 months (around 09/19/2022) for DM.   Continue healthy lifestyle choices,  including diet (rich in fruits, vegetables, and lean proteins, and low in salt and simple carbohydrates) and exercise (at least 30 minutes of moderate physical activity daily).  Educational handout given for DM  The above assessment and management plan was discussed with the patient. The patient verbalized understanding of and has agreed to the management plan. Patient is aware to call the clinic if they develop any new symptoms or if symptoms persist or worsen. Patient is aware when to return to the clinic for a follow-up visit. Patient educated on when it is appropriate to go to the emergency department.   Kari Baars, FNP-C [facility] [phone]

## 2022-06-19 NOTE — Patient Instructions (Addendum)

## 2022-06-20 ENCOUNTER — Other Ambulatory Visit: Payer: Self-pay

## 2022-06-20 DIAGNOSIS — E875 Hyperkalemia: Secondary | ICD-10-CM

## 2022-06-20 LAB — CMP14+EGFR
ALT: 11 IU/L (ref 0–44)
Albumin/Globulin Ratio: 1.6 (ref 1.2–2.2)
Albumin: 4 g/dL (ref 3.8–4.8)
Alkaline Phosphatase: 152 IU/L — ABNORMAL HIGH (ref 44–121)
BUN/Creatinine Ratio: 10 (ref 10–24)
BUN: 11 mg/dL (ref 8–27)
Bilirubin Total: 0.3 mg/dL (ref 0.0–1.2)
CO2: 24 mmol/L (ref 20–29)
Calcium: 9 mg/dL (ref 8.6–10.2)
Globulin, Total: 2.5 g/dL (ref 1.5–4.5)
Glucose: 92 mg/dL (ref 70–99)
Total Protein: 6.5 g/dL (ref 6.0–8.5)
eGFR: 72 mL/min/{1.73_m2} (ref >59)

## 2022-06-20 LAB — CBC WITH DIFFERENTIAL/PLATELET
Basophils Absolute: 0 10*3/uL (ref 0.0–0.2)
Basos: 0 %
Eos: 1 %
Hematocrit: 37.6 % (ref 37.5–51.0)
Hemoglobin: 12 g/dL — ABNORMAL LOW (ref 13.0–17.7)
Immature Grans (Abs): 0.1 10*3/uL (ref 0.0–0.1)
Immature Granulocytes: 1 %
Lymphocytes Absolute: 8.4 10*3/uL — ABNORMAL HIGH (ref 0.7–3.1)
Lymphs: 54 %
MCH: 28.9 pg (ref 26.6–33.0)
MCV: 91 fL (ref 79–97)
Monocytes: 5 %
Neutrophils Absolute: 6.2 10*3/uL (ref 1.4–7.0)
RBC: 4.15 x10E6/uL (ref 4.14–5.80)
WBC: 15.7 10*3/uL — ABNORMAL HIGH (ref 3.4–10.8)

## 2022-06-20 LAB — LIPID PANEL
Cholesterol, Total: 123 mg/dL (ref 100–199)
HDL: 40 mg/dL (ref >39)
LDL Chol Calc (NIH): 64 mg/dL (ref 0–99)
Triglycerides: 100 mg/dL (ref 0–149)
VLDL Cholesterol Cal: 19 mg/dL (ref 5–40)

## 2022-06-20 NOTE — Addendum Note (Signed)
Addended by: Sonny Masters on: 06/20/2022 07:37 AM   Modules accepted: Orders

## 2022-06-26 DIAGNOSIS — Z720 Tobacco use: Secondary | ICD-10-CM | POA: Diagnosis not present

## 2022-06-26 DIAGNOSIS — C641 Malignant neoplasm of right kidney, except renal pelvis: Secondary | ICD-10-CM | POA: Diagnosis not present

## 2022-06-26 DIAGNOSIS — C787 Secondary malignant neoplasm of liver and intrahepatic bile duct: Secondary | ICD-10-CM | POA: Diagnosis not present

## 2022-07-16 DIAGNOSIS — C641 Malignant neoplasm of right kidney, except renal pelvis: Secondary | ICD-10-CM | POA: Diagnosis not present

## 2022-07-16 DIAGNOSIS — C9110 Chronic lymphocytic leukemia of B-cell type not having achieved remission: Secondary | ICD-10-CM | POA: Diagnosis not present

## 2022-07-28 DIAGNOSIS — Z5112 Encounter for antineoplastic immunotherapy: Secondary | ICD-10-CM | POA: Diagnosis not present

## 2022-07-28 DIAGNOSIS — C641 Malignant neoplasm of right kidney, except renal pelvis: Secondary | ICD-10-CM | POA: Diagnosis not present

## 2022-08-06 ENCOUNTER — Other Ambulatory Visit: Payer: Self-pay | Admitting: Family Medicine

## 2022-08-06 DIAGNOSIS — E1159 Type 2 diabetes mellitus with other circulatory complications: Secondary | ICD-10-CM

## 2022-08-06 DIAGNOSIS — E1169 Type 2 diabetes mellitus with other specified complication: Secondary | ICD-10-CM

## 2022-09-08 DIAGNOSIS — C641 Malignant neoplasm of right kidney, except renal pelvis: Secondary | ICD-10-CM | POA: Diagnosis not present

## 2022-09-08 DIAGNOSIS — Z79899 Other long term (current) drug therapy: Secondary | ICD-10-CM | POA: Diagnosis not present

## 2022-09-08 DIAGNOSIS — Z5112 Encounter for antineoplastic immunotherapy: Secondary | ICD-10-CM | POA: Diagnosis not present

## 2022-09-27 ENCOUNTER — Emergency Department (HOSPITAL_COMMUNITY): Payer: Medicare Other

## 2022-09-27 ENCOUNTER — Other Ambulatory Visit: Payer: Self-pay

## 2022-09-27 ENCOUNTER — Inpatient Hospital Stay (HOSPITAL_COMMUNITY)
Admission: EM | Admit: 2022-09-27 | Discharge: 2022-09-30 | DRG: 177 | Disposition: A | Discharge status: Home / Self Care | Payer: Medicare Other | Attending: Internal Medicine | Admitting: Internal Medicine

## 2022-09-27 ENCOUNTER — Encounter (HOSPITAL_COMMUNITY): Payer: Self-pay

## 2022-09-27 DIAGNOSIS — J440 Chronic obstructive pulmonary disease with acute lower respiratory infection: Secondary | ICD-10-CM | POA: Diagnosis not present

## 2022-09-27 DIAGNOSIS — Z7984 Long term (current) use of oral hypoglycemic drugs: Secondary | ICD-10-CM

## 2022-09-27 DIAGNOSIS — J441 Chronic obstructive pulmonary disease with (acute) exacerbation: Secondary | ICD-10-CM | POA: Diagnosis not present

## 2022-09-27 DIAGNOSIS — I10 Essential (primary) hypertension: Secondary | ICD-10-CM | POA: Diagnosis not present

## 2022-09-27 DIAGNOSIS — Z7901 Long term (current) use of anticoagulants: Secondary | ICD-10-CM

## 2022-09-27 DIAGNOSIS — Z905 Acquired absence of kidney: Secondary | ICD-10-CM

## 2022-09-27 DIAGNOSIS — D696 Thrombocytopenia, unspecified: Secondary | ICD-10-CM | POA: Insufficient documentation

## 2022-09-27 DIAGNOSIS — E1121 Type 2 diabetes mellitus with diabetic nephropathy: Secondary | ICD-10-CM | POA: Diagnosis present

## 2022-09-27 DIAGNOSIS — D6959 Other secondary thrombocytopenia: Secondary | ICD-10-CM | POA: Diagnosis not present

## 2022-09-27 DIAGNOSIS — K769 Liver disease, unspecified: Secondary | ICD-10-CM | POA: Diagnosis not present

## 2022-09-27 DIAGNOSIS — R918 Other nonspecific abnormal finding of lung field: Secondary | ICD-10-CM | POA: Diagnosis not present

## 2022-09-27 DIAGNOSIS — F17210 Nicotine dependence, cigarettes, uncomplicated: Secondary | ICD-10-CM | POA: Diagnosis present

## 2022-09-27 DIAGNOSIS — Z79899 Other long term (current) drug therapy: Secondary | ICD-10-CM | POA: Diagnosis not present

## 2022-09-27 DIAGNOSIS — Z825 Family history of asthma and other chronic lower respiratory diseases: Secondary | ICD-10-CM | POA: Diagnosis not present

## 2022-09-27 DIAGNOSIS — Z86718 Personal history of other venous thrombosis and embolism: Secondary | ICD-10-CM | POA: Diagnosis not present

## 2022-09-27 DIAGNOSIS — U071 COVID-19: Principal | ICD-10-CM | POA: Insufficient documentation

## 2022-09-27 DIAGNOSIS — I7143 Infrarenal abdominal aortic aneurysm, without rupture: Secondary | ICD-10-CM | POA: Diagnosis not present

## 2022-09-27 DIAGNOSIS — R0902 Hypoxemia: Secondary | ICD-10-CM

## 2022-09-27 DIAGNOSIS — J984 Other disorders of lung: Secondary | ICD-10-CM | POA: Diagnosis not present

## 2022-09-27 DIAGNOSIS — E871 Hypo-osmolality and hyponatremia: Secondary | ICD-10-CM | POA: Diagnosis not present

## 2022-09-27 DIAGNOSIS — C641 Malignant neoplasm of right kidney, except renal pelvis: Secondary | ICD-10-CM | POA: Diagnosis not present

## 2022-09-27 DIAGNOSIS — J1282 Pneumonia due to coronavirus disease 2019: Secondary | ICD-10-CM | POA: Insufficient documentation

## 2022-09-27 DIAGNOSIS — R0989 Other specified symptoms and signs involving the circulatory and respiratory systems: Secondary | ICD-10-CM | POA: Diagnosis not present

## 2022-09-27 DIAGNOSIS — E869 Volume depletion, unspecified: Secondary | ICD-10-CM | POA: Diagnosis present

## 2022-09-27 DIAGNOSIS — N492 Inflammatory disorders of scrotum: Secondary | ICD-10-CM | POA: Diagnosis not present

## 2022-09-27 DIAGNOSIS — Z813 Family history of other psychoactive substance abuse and dependence: Secondary | ICD-10-CM | POA: Diagnosis not present

## 2022-09-27 DIAGNOSIS — R590 Localized enlarged lymph nodes: Secondary | ICD-10-CM | POA: Diagnosis present

## 2022-09-27 DIAGNOSIS — J9601 Acute respiratory failure with hypoxia: Secondary | ICD-10-CM | POA: Diagnosis not present

## 2022-09-27 DIAGNOSIS — E1169 Type 2 diabetes mellitus with other specified complication: Secondary | ICD-10-CM | POA: Diagnosis not present

## 2022-09-27 DIAGNOSIS — I152 Hypertension secondary to endocrine disorders: Secondary | ICD-10-CM | POA: Diagnosis present

## 2022-09-27 DIAGNOSIS — Z6834 Body mass index (BMI) 34.0-34.9, adult: Secondary | ICD-10-CM

## 2022-09-27 DIAGNOSIS — F419 Anxiety disorder, unspecified: Secondary | ICD-10-CM | POA: Diagnosis present

## 2022-09-27 DIAGNOSIS — Z6833 Body mass index (BMI) 33.0-33.9, adult: Secondary | ICD-10-CM

## 2022-09-27 DIAGNOSIS — R591 Generalized enlarged lymph nodes: Secondary | ICD-10-CM | POA: Diagnosis not present

## 2022-09-27 DIAGNOSIS — E782 Mixed hyperlipidemia: Secondary | ICD-10-CM | POA: Diagnosis not present

## 2022-09-27 DIAGNOSIS — R0600 Dyspnea, unspecified: Secondary | ICD-10-CM | POA: Diagnosis not present

## 2022-09-27 DIAGNOSIS — E1159 Type 2 diabetes mellitus with other circulatory complications: Secondary | ICD-10-CM | POA: Diagnosis present

## 2022-09-27 LAB — HEMOGLOBIN A1C
Hgb A1c MFr Bld: 6.7 % — ABNORMAL HIGH (ref 4.8–5.6)
Mean Plasma Glucose: 145.59 mg/dL

## 2022-09-27 LAB — COMPREHENSIVE METABOLIC PANEL
ALT: 16 U/L (ref 0–44)
AST: 18 U/L (ref 15–41)
Albumin: 3.1 g/dL — ABNORMAL LOW (ref 3.5–5.0)
Alkaline Phosphatase: 84 U/L (ref 38–126)
Anion gap: 10 (ref 5–15)
BUN: 24 mg/dL — ABNORMAL HIGH (ref 8–23)
CO2: 24 mmol/L (ref 22–32)
Calcium: 7.5 mg/dL — ABNORMAL LOW (ref 8.9–10.3)
Chloride: 93 mmol/L — ABNORMAL LOW (ref 98–111)
Creatinine, Ser: 1.32 mg/dL — ABNORMAL HIGH (ref 0.61–1.24)
GFR, Estimated: 58 mL/min — ABNORMAL LOW (ref >60)
Glucose, Bld: 118 mg/dL — ABNORMAL HIGH (ref 70–99)
Potassium: 4.4 mmol/L (ref 3.5–5.1)
Sodium: 127 mmol/L — ABNORMAL LOW (ref 135–145)
Total Bilirubin: 1.2 mg/dL (ref 0.3–1.2)
Total Protein: 6.4 g/dL — ABNORMAL LOW (ref 6.5–8.1)

## 2022-09-27 LAB — CBC WITH DIFFERENTIAL/PLATELET
Abs Immature Granulocytes: 0 10*3/uL (ref 0.00–0.07)
Basophils Absolute: 0 10*3/uL (ref 0.0–0.1)
Basophils Relative: 0 %
Eosinophils Absolute: 0 10*3/uL (ref 0.0–0.5)
Eosinophils Relative: 0 %
HCT: 41.8 % (ref 39.0–52.0)
Hemoglobin: 13.1 g/dL (ref 13.0–17.0)
Lymphocytes Relative: 41 %
Lymphs Abs: 7.1 10*3/uL — ABNORMAL HIGH (ref 0.7–4.0)
MCH: 28.8 pg (ref 26.0–34.0)
MCHC: 31.3 g/dL (ref 30.0–36.0)
MCV: 91.9 fL (ref 80.0–100.0)
Monocytes Absolute: 0.3 10*3/uL (ref 0.1–1.0)
Monocytes Relative: 2 %
Neutro Abs: 9.9 10*3/uL — ABNORMAL HIGH (ref 1.7–7.7)
Neutrophils Relative %: 57 %
Platelets: 126 10*3/uL — ABNORMAL LOW (ref 150–400)
RBC: 4.55 MIL/uL (ref 4.22–5.81)
RDW: 16.2 % — ABNORMAL HIGH (ref 11.5–15.5)
WBC: 17.3 10*3/uL — ABNORMAL HIGH (ref 4.0–10.5)
nRBC: 0 % (ref 0.0–0.2)

## 2022-09-27 LAB — LIPASE, BLOOD: Lipase: 16 U/L (ref 11–51)

## 2022-09-27 LAB — PROCALCITONIN: Procalcitonin: 0.13 ng/mL

## 2022-09-27 LAB — GLUCOSE, CAPILLARY: Glucose-Capillary: 138 mg/dL — ABNORMAL HIGH (ref 70–99)

## 2022-09-27 MED ORDER — SODIUM CHLORIDE 0.9 % IV SOLN
2.0000 g | INTRAVENOUS | Status: DC
  Administered 2022-09-27 – 2022-09-29 (×3): 2 g via INTRAVENOUS
  Filled 2022-09-27 (×3): qty 20

## 2022-09-27 MED ORDER — FLUCONAZOLE 100 MG PO TABS
200.0000 mg | ORAL_TABLET | Freq: Every day | ORAL | Status: DC
  Administered 2022-09-27 – 2022-09-29 (×3): 200 mg via ORAL
  Filled 2022-09-27 (×3): qty 2

## 2022-09-27 MED ORDER — IOHEXOL 350 MG/ML SOLN
75.0000 mL | Freq: Once | INTRAVENOUS | Status: AC | PRN
  Administered 2022-09-27: 75 mL via INTRAVENOUS

## 2022-09-27 MED ORDER — DEXAMETHASONE SODIUM PHOSPHATE 10 MG/ML IJ SOLN
6.0000 mg | INTRAMUSCULAR | Status: DC
  Administered 2022-09-27: 6 mg via INTRAVENOUS
  Filled 2022-09-27: qty 1

## 2022-09-27 MED ORDER — ONDANSETRON HCL 4 MG PO TABS: 4.0000 mg | ORAL_TABLET | Freq: Four times a day (QID) | ORAL | Status: DC | PRN

## 2022-09-27 MED ORDER — ESCITALOPRAM OXALATE 10 MG PO TABS
10.0000 mg | ORAL_TABLET | Freq: Every day | ORAL | Status: DC
  Administered 2022-09-28 – 2022-09-30 (×3): 10 mg via ORAL
  Filled 2022-09-27 (×3): qty 1

## 2022-09-27 MED ORDER — ADULT MULTIVITAMIN W/MINERALS CH
1.0000 | ORAL_TABLET | Freq: Every day | ORAL | Status: DC
  Administered 2022-09-27 – 2022-09-30 (×4): 1 via ORAL
  Filled 2022-09-27 (×4): qty 1

## 2022-09-27 MED ORDER — ATORVASTATIN CALCIUM 40 MG PO TABS
40.0000 mg | ORAL_TABLET | Freq: Every day | ORAL | Status: DC
  Administered 2022-09-27 – 2022-09-29 (×3): 40 mg via ORAL
  Filled 2022-09-27 (×3): qty 1

## 2022-09-27 MED ORDER — SODIUM CHLORIDE 0.9 % IV SOLN
200.0000 mg | Freq: Once | INTRAVENOUS | Status: AC
  Administered 2022-09-28: 200 mg via INTRAVENOUS
  Filled 2022-09-27: qty 40

## 2022-09-27 MED ORDER — ONDANSETRON HCL 4 MG/2ML IJ SOLN
4.0000 mg | Freq: Once | INTRAMUSCULAR | Status: AC
  Administered 2022-09-27: 4 mg via INTRAVENOUS
  Filled 2022-09-27: qty 2

## 2022-09-27 MED ORDER — IPRATROPIUM-ALBUTEROL 0.5-2.5 (3) MG/3ML IN SOLN
RESPIRATORY_TRACT | Status: AC
  Administered 2022-09-27: 3 mL
  Filled 2022-09-27: qty 3

## 2022-09-27 MED ORDER — LISINOPRIL 10 MG PO TABS
10.0000 mg | ORAL_TABLET | Freq: Every day | ORAL | Status: DC
  Administered 2022-09-28: 10 mg via ORAL
  Filled 2022-09-27: qty 1

## 2022-09-27 MED ORDER — ALBUTEROL SULFATE HFA 108 (90 BASE) MCG/ACT IN AERS: 2.0000 | INHALATION_SPRAY | Freq: Four times a day (QID) | RESPIRATORY_TRACT | Status: DC

## 2022-09-27 MED ORDER — ALBUTEROL SULFATE HFA 108 (90 BASE) MCG/ACT IN AERS
2.0000 | INHALATION_SPRAY | RESPIRATORY_TRACT | Status: DC | PRN
  Administered 2022-09-27: 2 via RESPIRATORY_TRACT
  Filled 2022-09-27: qty 6.7

## 2022-09-27 MED ORDER — LACTATED RINGERS IV SOLN: INTRAVENOUS | Status: DC

## 2022-09-27 MED ORDER — SODIUM CHLORIDE 0.9 % IV SOLN
500.0000 mg | Freq: Once | INTRAVENOUS | Status: AC
  Administered 2022-09-27: 500 mg via INTRAVENOUS
  Filled 2022-09-27: qty 5

## 2022-09-27 MED ORDER — THIAMINE MONONITRATE 100 MG PO TABS
100.0000 mg | ORAL_TABLET | Freq: Every day | ORAL | Status: DC
  Administered 2022-09-27 – 2022-09-30 (×4): 100 mg via ORAL
  Filled 2022-09-27 (×4): qty 1

## 2022-09-27 MED ORDER — APIXABAN 5 MG PO TABS
5.0000 mg | ORAL_TABLET | Freq: Two times a day (BID) | ORAL | Status: DC
  Administered 2022-09-27 – 2022-09-30 (×6): 5 mg via ORAL
  Filled 2022-09-27 (×6): qty 1

## 2022-09-27 MED ORDER — IPRATROPIUM-ALBUTEROL 0.5-2.5 (3) MG/3ML IN SOLN
3.0000 mL | Freq: Four times a day (QID) | RESPIRATORY_TRACT | Status: DC
  Administered 2022-09-28 – 2022-09-30 (×10): 3 mL via RESPIRATORY_TRACT
  Filled 2022-09-27 (×10): qty 3

## 2022-09-27 MED ORDER — AXITINIB 5 MG PO TABS
5.0000 mg | ORAL_TABLET | Freq: Two times a day (BID) | ORAL | Status: DC
  Administered 2022-09-28: 5 mg via ORAL

## 2022-09-27 MED ORDER — SODIUM CHLORIDE 0.9 % IV SOLN: INTRAVENOUS | Status: DC

## 2022-09-27 MED ORDER — INSULIN ASPART 100 UNIT/ML IJ SOLN
0.0000 [IU] | Freq: Three times a day (TID) | INTRAMUSCULAR | Status: DC
  Administered 2022-09-28: 4 [IU] via SUBCUTANEOUS

## 2022-09-27 MED ORDER — SODIUM CHLORIDE 0.9 % IV SOLN
100.0000 mg | Freq: Every day | INTRAVENOUS | Status: AC
  Administered 2022-09-28 – 2022-09-29 (×2): 100 mg via INTRAVENOUS
  Filled 2022-09-27 (×2): qty 20

## 2022-09-27 MED ORDER — SODIUM CHLORIDE 0.9 % IV BOLUS
500.0000 mL | Freq: Once | INTRAVENOUS | Status: AC
  Administered 2022-09-27: 500 mL via INTRAVENOUS

## 2022-09-27 MED ORDER — ALBUTEROL SULFATE (2.5 MG/3ML) 0.083% IN NEBU
INHALATION_SOLUTION | RESPIRATORY_TRACT | Status: AC
  Administered 2022-09-27: 2.5 mg
  Filled 2022-09-27: qty 3

## 2022-09-27 MED ORDER — INSULIN GLARGINE-YFGN 100 UNIT/ML ~~LOC~~ SOLN
20.0000 [IU] | Freq: Every day | SUBCUTANEOUS | Status: DC
  Administered 2022-09-27 – 2022-09-29 (×3): 20 [IU] via SUBCUTANEOUS
  Filled 2022-09-27 (×4): qty 0.2

## 2022-09-27 MED ORDER — INSULIN ASPART 100 UNIT/ML IJ SOLN: 0.0000 [IU] | Freq: Every day | INTRAMUSCULAR | Status: DC

## 2022-09-27 MED ORDER — ONDANSETRON HCL 4 MG/2ML IJ SOLN: 4.0000 mg | Freq: Four times a day (QID) | INTRAMUSCULAR | Status: DC | PRN

## 2022-09-27 MED ORDER — FOLIC ACID 1 MG PO TABS
1.0000 mg | ORAL_TABLET | Freq: Every day | ORAL | Status: DC
  Administered 2022-09-27 – 2022-09-30 (×4): 1 mg via ORAL
  Filled 2022-09-27 (×4): qty 1

## 2022-09-27 NOTE — H&P (Signed)
History and Physical    Patient: George Anthony GNF:[id number] DOB: 1950-04-04 DOA: 09/27/2022 DOS: the patient was seen and examined on 09/27/2022 PCP: Sonny Masters, FNP  Patient coming from: Home  Chief Complaint:  Chief Complaint  Patient presents with   Shortness of Breath   HPI: George Anthony is a 72 y.o. male with medical history significant of history of DVT in the setting of renal cell carcinoma, recurrence of renal cell carcinoma currently on chemotherapy, hypertension, diabetes, diabetic nephropathy, hyperlipidemia.  Patient also states that he has chronic leukemia, although there is no comment on this that I could see on the oncology notes in Care Everywhere.  Patient having shortness of breath, cough, wheezing for the past 2 days.  He has been fatigued and extremely tired and has been unable to do much of anything.  He went to the urgent care for evaluation.  They did a COVID test and saw that he was hypoxic and recommended that he come here for evaluation.  He has bodyaches, back pain, chest pressure.  On arrival, his oxygen saturation was in the 80s on room air.  This responded to oxygen via nasal cannula to the mid 90s.  Chest x-ray shows multifocal pneumonia.  White count is 17 with increased neutrophils.  Also has a groin rash that has been going on for the past couple of days.  This is erythematous and the inguinal creases and on the scrotum.  He tried putting some Balmex on it, but finds that it burns significantly.  Review of Systems: As mentioned in the history of present illness. All other systems reviewed and are negative. Past Medical History:  Diagnosis Date   Anxiety    Diabetes mellitus without complication (HCC)    Diabetic nephropathy associated with type 2 diabetes mellitus (HCC) 02/21/2021   DVT (deep venous thrombosis) (HCC)    Enlarged heart    Hyperlipidemia    Hypertension    Renal cell carcinoma [facility])    Past Surgical History:  Procedure  Laterality Date   BIOPSY  09/16/2019   Procedure: BIOPSY;  Surgeon: Lanelle Bal, DO;  Location: [facility];  Service: Endoscopy;;   COLONOSCOPY WITH PROPOFOL N/A 09/16/2019   Procedure: COLONOSCOPY WITH PROPOFOL;  Surgeon: Lanelle Bal, DO;  Location: [facility];  Service: Endoscopy;  Laterality: N/A;  7:30am   POLYPECTOMY  09/16/2019   Procedure: POLYPECTOMY;  Surgeon: Lanelle Bal, DO;  Location: [facility];  Service: Endoscopy;;   Social History:  reports that he has been smoking cigarettes. He has a 40 pack-year smoking history. He has never used smokeless tobacco. He reports current alcohol use of about 3.0 standard drinks of alcohol per week. He reports that he does not use drugs.  No Known Allergies  Family History  Problem Relation Age of Onset   Emphysema Mother    Drug abuse Sister    Colon cancer Neg Hx     Prior to Admission medications   Medication Sig Start Date End Date Taking? Authorizing Provider  acetaminophen (TYLENOL) 500 MG tablet Take 1,000 mg by mouth every 6 (six) hours as needed for moderate pain.    [provider]  albuterol (PROVENTIL) (2.5 MG/3ML) 0.083% nebulizer solution Take 2.5 mg by nebulization every 6 (six) hours as needed for wheezing or shortness of breath. 09/27/22 09/27/22  [provider]  apixaban (ELIQUIS) 5 MG TABS tablet Take 1 tablet (5 mg total) by mouth 2 (two) times  daily. 03/20/22   Sonny Masters, FNP  atorvastatin (LIPITOR) 40 MG tablet TAKE 1 TABLET BY MOUTH AT BEDTIME 08/06/22   Sonny Masters, FNP  axitinib (INLYTA) 5 MG tablet Take 5 mg by mouth every 12 (twelve) hours. 06/02/22 11/29/22  [provider]  CINNAMON PO Take by mouth.    [provider]  escitalopram (LEXAPRO) 10 MG tablet Take 1 tablet (10 mg total) by mouth daily. 12/19/21   Sonny Masters, FNP  furosemide (LASIX) 40 MG tablet Take 1 tablet (40 mg total) by mouth daily as needed for edema. 06/19/22   Sonny Masters, FNP   glipiZIDE (GLUCOTROL) 5 MG tablet Take 1 tablet (5 mg total) by mouth 2 (two) times daily before a meal. 06/19/22   Rakes, Doralee Albino, FNP  glucose blood test strip Check TID and PRN dx E11.9 02/17/19   Sonny Masters, FNP  ipratropium (ATROVENT) 0.02 % nebulizer solution Take 0.5 mg by nebulization every 6 (six) hours as needed for shortness of breath or wheezing. 09/27/22 09/27/22  [provider]  lisinopril (ZESTRIL) 10 MG tablet Take 1 tablet by mouth once daily 08/06/22   Sonny Masters, FNP  metFORMIN (GLUCOPHAGE) 1000 MG tablet Take 1 tablet (1,000 mg total) by mouth 2 (two) times daily with a meal. 03/20/22   Rakes, Doralee Albino, FNP  Semaglutide (RYBELSUS) 7 MG TABS Take 7 mg by mouth daily. 09/12/21   [provider]    Physical Exam: Vitals:   09/27/22 1615 09/27/22 1652 09/27/22 1654 09/27/22 1700  BP: 123/70 123/80 126/88 126/88  Pulse: 92   92  Resp: 18   18  Temp: 98.8 F (37.1 C) 98.8 F (37.1 C) 98.8 F (37.1 C) 98.8 F (37.1 C)  TempSrc: Oral Oral Oral Oral  SpO2:    97%  Weight:      Height:       General: Elderly male. Awake and alert and oriented x3. No acute cardiopulmonary distress.  HEENT: Normocephalic atraumatic.  Right and left ears normal in appearance.  Pupils equal, round, reactive to light. Extraocular muscles are intact. Sclerae anicteric and noninjected.  Moist mucosal membranes. No mucosal lesions.  Neck: Neck supple without lymphadenopathy. No carotid bruits. No masses palpated.  Cardiovascular: Regular rate with normal S1-S2 sounds. No murmurs, rubs, gallops auscultated. No JVD.  Respiratory: Rhonchorous breath sounds with diffuse rales.  Expiratory wheezing..  No accessory muscle use. Abdomen: Soft, nontender, nondistended. Active bowel sounds. No masses or hepatosplenomegaly  Skin: Patient has a 1 and half centimeter raised rough blackish on the left nipple.  Looks similar to a seborrheic keratosis that is encompassing the majority of his areola  and nipple.  There is erythematous of his scrotum and inguinal creases with excoriations.  Dry, warm to touch. 2+ dorsalis pedis and radial pulses. Musculoskeletal: No calf or leg pain. All major joints not erythematous nontender.  No upper or lower joint deformation.  Good ROM.  No contractures  Psychiatric: Intact judgment and insight. Pleasant and cooperative. Neurologic: No focal neurological deficits. Strength is 5/5 and symmetric in upper and lower extremities.  Cranial nerves II through XII are grossly intact.  Data Reviewed: Results for orders placed or performed during the hospital encounter of 09/27/22 (from the past 24 hour(s))  CBC with Differential     Status: Abnormal   Collection Time: 09/27/22  3:31 PM  Result Value Ref Range   WBC 17.3 (H) 4.0 - 10.5 K/uL  RBC 4.55 4.22 - 5.81 MIL/uL   Hemoglobin 13.1 13.0 - 17.0 g/dL   HCT 57.8 46.9 - 62.9 %   MCV 91.9 80.0 - 100.0 fL   MCH 28.8 26.0 - 34.0 pg   MCHC 31.3 30.0 - 36.0 g/dL   RDW 52.8 (H) 41.3 - 24.4 %   Platelets 126 (L) 150 - 400 K/uL   nRBC 0.0 0.0 - 0.2 %   Neutrophils Relative % 57 %   Neutro Abs 9.9 (H) 1.7 - 7.7 K/uL   Lymphocytes Relative 41 %   Lymphs Abs 7.1 (H) 0.7 - 4.0 K/uL   Monocytes Relative 2 %   Monocytes Absolute 0.3 0.1 - 1.0 K/uL   Eosinophils Relative 0 %   Eosinophils Absolute 0.0 0.0 - 0.5 K/uL   Basophils Relative 0 %   Basophils Absolute 0.0 0.0 - 0.1 K/uL   RBC Morphology MORPHOLOGY UNREMARKABLE    Smear Review MORPHOLOGY UNREMARKABLE    Abs Immature Granulocytes 0.00 0.00 - 0.07 K/uL   Reactive, Benign Lymphocytes PRESENT    Smudge Cells PRESENT   Comprehensive metabolic panel     Status: Abnormal   Collection Time: 09/27/22  3:31 PM  Result Value Ref Range   Sodium 127 (L) 135 - 145 mmol/L   Potassium 4.4 3.5 - 5.1 mmol/L   Chloride 93 (L) 98 - 111 mmol/L   CO2 24 22 - 32 mmol/L   Glucose, Bld 118 (H) 70 - 99 mg/dL   BUN 24 (H) 8 - 23 mg/dL   Creatinine, Ser 0.10 (H) 0.61 -  1.24 mg/dL   Calcium 7.5 (L) 8.9 - 10.3 mg/dL   Total Protein 6.4 (L) 6.5 - 8.1 g/dL   Albumin 3.1 (L) 3.5 - 5.0 g/dL   AST 18 15 - 41 U/L   ALT 16 0 - 44 U/L   Alkaline Phosphatase 84 38 - 126 U/L   Total Bilirubin 1.2 0.3 - 1.2 mg/dL   GFR, Estimated 58 (L) >60 mL/min   Anion gap 10 5 - 15  Lipase, blood     Status: None   Collection Time: 09/27/22  3:31 PM  Result Value Ref Range   Lipase 16 11 - 51 U/L  Procalcitonin     Status: None   Collection Time: 09/27/22  4:53 PM  Result Value Ref Range   Procalcitonin 0.13 ng/mL    DG Chest Portable 1 View  Result Date: 09/27/2022 CLINICAL DATA:  hypoxia, covid + EXAM: PORTABLE CHEST - 1 VIEW COMPARISON:  06/01/2020 and previous FINDINGS: Interval median sternotomy. Low lung volumes with patchy airspace opacities in the lung bases. Heart size and mediastinal contours are within normal limits. No effusion. IMPRESSION: Low lung volumes with patchy bibasilar airspace disease. Electronically Signed   By: Corlis Leak M.D.   On: 09/27/2022 16:12     Assessment and Plan: No notes have been filed under this hospital service. Service: Hospitalist  Principal Problem:   Acute respiratory failure with hypoxia (HCC) Active Problems:   Hyperlipidemia associated with type 2 diabetes mellitus (HCC)   Hypertension associated with type 2 diabetes mellitus (HCC)   Morbid obesity (HCC)   Diabetic nephropathy associated with type 2 diabetes mellitus (HCC)   Renal cell carcinoma, right (HCC)   Pneumonia due to COVID-19 virus   Scrotal infection  Acute respiratory failure with hypoxia Support with oxygen Check CTA to make sure that he does not have PE given his history of DVT and his current oncologic  state Pneumonia secondary to COVID-19 Start remdesivir given his high risk state: Infiltrates on chest x-ray, oxygen requirement, severe risk factors with diabetes and current treatment of cancer Dexamethasone Single dose of Rocephin and azithromycin  given due to his elevated white count to cover possible bacterial pneumonia superimposed with the COVID-19. Procalcitonin ordered by me: 0.13.  No further doses of antibiotics Check CBC tomorrow Check LFTs tomorrow Scrotal infection This appears fungal to me.  Will start on Diflucan. Diabetes Hold oral medications Lantus 20 units tonight and sliding scale for meal coverage Hyperlipidemia Hypertension  continue home meds   Advance Care Planning:   Code Status: Full Code confirmed by patient  Consults: None  Family Communication: Wife present during interview and exam  Severity of Illness: The appropriate patient status for this patient is INPATIENT. Inpatient status is judged to be reasonable and necessary in order to provide the required intensity of service to ensure the patient's safety. The patient's presenting symptoms, physical exam findings, and initial radiographic and laboratory data in the context of their chronic comorbidities is felt to place them at high risk for further clinical deterioration. Furthermore, it is not anticipated that the patient will be medically stable for discharge from the hospital within 2 midnights of admission.   * I certify that at the point of admission it is my clinical judgment that the patient will require inpatient hospital care spanning beyond 2 midnights from the point of admission due to high intensity of service, high risk for further deterioration and high frequency of surveillance required.*  Author: Levie Heritage, DO 09/27/2022 5:49 PM  For on call review [email or web address].

## 2022-09-27 NOTE — ED Provider Notes (Signed)
[place] EMERGENCY DEPARTMENT AT [facility] Provider Note   CSN: [id number] Arrival date & time: 09/27/22  1303     History  Chief Complaint  Patient presents with   Shortness of Breath    George Anthony is a 72 y.o. male with a including diabetes, hypertension, renal cell carcinoma, history of distant DVT but still currently on Eliquis presenting for evaluation of a 2-day history of increasing shortness of breath with cold-like symptoms which include nasal congestion with clear rhinorrhea, nonproductive cough and increasing shortness of breath.  He was seen at our local urgent care center and he was found to be hypoxic and was referred to our ED.  Upon arrival his oxygen saturations 87% on room air, he is greater than 95% on 2 L.  He denies chest pain but does endorse wheezing and shortness of breath.  He is a smoker, he endorses nausea without emesis and states he has been unable to tolerate any p.o. intake for the past 2 days simply due to lack of appetite, he denies abdominal pain.  He does endorse a 3 day history of painful burning skin of his groin and scrotum.  He has applied balmex which has made the burning worse.  He denies itching and has noticed bloody residue in his underwear.   The history is provided by the patient.       Home Medications Prior to Admission medications   Medication Sig Start Date End Date Taking? Authorizing Provider  acetaminophen (TYLENOL) 500 MG tablet Take 1,000 mg by mouth every 6 (six) hours as needed for moderate pain.    [provider]  apixaban (ELIQUIS) 5 MG TABS tablet Take 1 tablet (5 mg total) by mouth 2 (two) times daily. 03/20/22   Sonny Masters, FNP  atorvastatin (LIPITOR) 40 MG tablet TAKE 1 TABLET BY MOUTH AT BEDTIME 08/06/22   Sonny Masters, FNP  CINNAMON PO Take by mouth.    [provider]  escitalopram (LEXAPRO) 10 MG tablet Take 1 tablet (10 mg total) by mouth daily. 12/19/21   Sonny Masters, FNP   furosemide (LASIX) 40 MG tablet Take 1 tablet (40 mg total) by mouth daily as needed for edema. 06/19/22   Sonny Masters, FNP  glipiZIDE (GLUCOTROL) 5 MG tablet Take 1 tablet (5 mg total) by mouth 2 (two) times daily before a meal. 06/19/22   Rakes, Doralee Albino, FNP  glucose blood test strip Check TID and PRN dx E11.9 02/17/19   Rakes, Doralee Albino, FNP  lisinopril (ZESTRIL) 10 MG tablet Take 1 tablet by mouth once daily 08/06/22   Sonny Masters, FNP  metFORMIN (GLUCOPHAGE) 1000 MG tablet Take 1 tablet (1,000 mg total) by mouth 2 (two) times daily with a meal. 03/20/22   Rakes, Doralee Albino, FNP      Allergies    Patient has no known allergies.    Review of Systems   Review of Systems  Constitutional:  Positive for appetite change and fatigue. Negative for fever.  HENT:  Positive for congestion and rhinorrhea. Negative for sore throat.   Eyes: Negative.   Respiratory:  Positive for cough, chest tightness and shortness of breath.   Cardiovascular:  Negative for chest pain.  Gastrointestinal:  Positive for nausea. Negative for abdominal pain and vomiting.  Genitourinary: Negative.   Musculoskeletal:  Negative for arthralgias, joint swelling and neck pain.  Skin: Negative.  Negative for rash and wound.  Neurological:  Positive for weakness.  Negative for dizziness, light-headedness, numbness and headaches.  Psychiatric/Behavioral: Negative.    All other systems reviewed and are negative.   Physical Exam Updated Vital Signs BP 126/88 (BP Location: Left Arm)   Pulse 92   Temp 98.8 F (37.1 C) (Oral)   Resp 18   Ht 5\' 7"  (1.702 m)   Wt 99.8 kg   SpO2 97%   BMI 34.46 kg/m  Physical Exam Vitals and nursing note reviewed. Exam conducted with a chaperone present.  Constitutional:      Appearance: He is well-developed.  HENT:     Head: Normocephalic and atraumatic.  Eyes:     Conjunctiva/sclera: Conjunctivae normal.  Cardiovascular:     Rate and Rhythm: Normal rate and regular rhythm.     Heart  sounds: Normal heart sounds.  Pulmonary:     Effort: Pulmonary effort is normal.     Breath sounds: Decreased breath sounds and wheezing present.  Chest:     Chest wall: No mass.     Comments: Dark, raised atypical mole left nipple. Abdominal:     General: Bowel sounds are normal.     Palpations: Abdomen is soft.     Tenderness: There is no abdominal tenderness.  Genitourinary:    Pubic Area: Rash present.     Comments: Erythematous skin scrotum and groin with excoriated appearing bleeding scratches.  Moist (? Balmex).  No satellite lesions.  Musculoskeletal:        General: Normal range of motion.     Cervical back: Normal range of motion.     Right lower leg: No tenderness. Edema present.     Left lower leg: No tenderness. Edema present.  Skin:    General: Skin is warm and dry.  Neurological:     Mental Status: He is alert.     ED Results / Procedures / Treatments   Labs (all labs ordered are listed, but only abnormal results are displayed) Labs Reviewed  CBC WITH DIFFERENTIAL/PLATELET - Abnormal; Notable for the following components:      Result Value   WBC 17.3 (*)    RDW 16.2 (*)    Platelets 126 (*)    Neutro Abs 9.9 (*)    Lymphs Abs 7.1 (*)    All other components within normal limits  COMPREHENSIVE METABOLIC PANEL - Abnormal; Notable for the following components:   Sodium 127 (*)    Chloride 93 (*)    Glucose, Bld 118 (*)    BUN 24 (*)    Creatinine, Ser 1.32 (*)    Calcium 7.5 (*)    Total Protein 6.4 (*)    Albumin 3.1 (*)    GFR, Estimated 58 (*)    All other components within normal limits  LIPASE, BLOOD  PATHOLOGIST SMEAR REVIEW  PROCALCITONIN    EKG None  Radiology DG Chest Portable 1 View  Result Date: 09/27/2022 CLINICAL DATA:  hypoxia, covid + EXAM: PORTABLE CHEST - 1 VIEW COMPARISON:  06/01/2020 and previous FINDINGS: Interval median sternotomy. Low lung volumes with patchy airspace opacities in the lung bases. Heart size and mediastinal  contours are within normal limits. No effusion. IMPRESSION: Low lung volumes with patchy bibasilar airspace disease. Electronically Signed   By: Corlis Leak M.D.   On: 09/27/2022 16:12    Procedures Procedures    Medications Ordered in ED Medications  albuterol (VENTOLIN HFA) 108 (90 Base) MCG/ACT inhaler 2 puff (2 puffs Inhalation Given 09/27/22 1507)  cefTRIAXone (ROCEPHIN) 2 g in sodium  chloride 0.9 % 100 mL IVPB (has no administration in time range)  azithromycin (ZITHROMAX) 500 mg in sodium chloride 0.9 % 250 mL IVPB (has no administration in time range)  dexamethasone (DECADRON) injection 6 mg (has no administration in time range)  lactated ringers infusion (has no administration in time range)  sodium chloride 0.9 % bolus 500 mL (0 mLs Intravenous Stopped 09/27/22 1652)  ondansetron (ZOFRAN) injection 4 mg (4 mg Intravenous Given 09/27/22 1519)  iohexol (OMNIPAQUE) 350 MG/ML injection 75 mL (75 mLs Intravenous Contrast Given 09/27/22 1655)    ED Course/ Medical Decision Making/ A&P                                 Medical Decision Making Pt presenting with 2 day hx of cold like sx quickly escalating to cough, sob and hypoxia upon arrival here.  Covid + at our urgent care prior to arrival.  He has wheezing on exam,  better but not resolved after albuterol mdi dose.    Pt will need admission for Covid 19 with pneumonia and hypoxia.  Discussed with Dr. Adrian Blackwater who will admit.  Rocephin and zithromax, dexamethasone ordered.   Amount and/or Complexity of Data Reviewed Labs: ordered.    Details: Wbc count 17.3, Na+ 127, creatinine 1.32.   Radiology: ordered.    Details: Cxr confirming covid pneumonia.   Risk Prescription drug management.           Final Clinical Impression(s) / ED Diagnoses Final diagnoses:  COVID-19  Pneumonia due to COVID-19 virus  Hypoxia    Rx / DC Orders ED Discharge Orders     None         Victoriano Lain 09/27/22 1705    Derwood Kaplan, MD 09/28/22 [phone]

## 2022-09-27 NOTE — ED Triage Notes (Signed)
"  Seen at urgent care for cough, shortness of breath and covid. Sent here for low oxygen levels" per spouse

## 2022-09-28 DIAGNOSIS — U071 COVID-19: Secondary | ICD-10-CM | POA: Diagnosis not present

## 2022-09-28 DIAGNOSIS — D696 Thrombocytopenia, unspecified: Secondary | ICD-10-CM | POA: Diagnosis not present

## 2022-09-28 DIAGNOSIS — J1282 Pneumonia due to coronavirus disease 2019: Secondary | ICD-10-CM

## 2022-09-28 DIAGNOSIS — J9601 Acute respiratory failure with hypoxia: Secondary | ICD-10-CM

## 2022-09-28 DIAGNOSIS — N492 Inflammatory disorders of scrotum: Secondary | ICD-10-CM

## 2022-09-28 LAB — CBC
HCT: 41.7 % (ref 39.0–52.0)
Hemoglobin: 13.1 g/dL (ref 13.0–17.0)
MCH: 28.8 pg (ref 26.0–34.0)
MCHC: 31.4 g/dL (ref 30.0–36.0)
MCV: 91.6 fL (ref 80.0–100.0)
Platelets: 126 10*3/uL — ABNORMAL LOW (ref 150–400)
RBC: 4.55 MIL/uL (ref 4.22–5.81)
RDW: 16.2 % — ABNORMAL HIGH (ref 11.5–15.5)
WBC: 17.6 10*3/uL — ABNORMAL HIGH (ref 4.0–10.5)
nRBC: 0 % (ref 0.0–0.2)

## 2022-09-28 LAB — URINALYSIS, W/ REFLEX TO CULTURE (INFECTION SUSPECTED)
Bacteria, UA: NONE SEEN
Bilirubin Urine: NEGATIVE
Glucose, UA: >=500 mg/dL — AB
Ketones, ur: 5 mg/dL — AB
Leukocytes,Ua: NEGATIVE
Nitrite: NEGATIVE
Protein, ur: 30 mg/dL — AB
Specific Gravity, Urine: 1.014 (ref 1.005–1.030)
pH: 6 (ref 5.0–8.0)

## 2022-09-28 LAB — TSH: TSH: 0.467 u[IU]/mL (ref 0.350–4.500)

## 2022-09-28 LAB — COMPREHENSIVE METABOLIC PANEL
ALT: 16 U/L (ref 0–44)
AST: 15 U/L (ref 15–41)
Albumin: 2.9 g/dL — ABNORMAL LOW (ref 3.5–5.0)
Alkaline Phosphatase: 83 U/L (ref 38–126)
Anion gap: 13 (ref 5–15)
BUN: 22 mg/dL (ref 8–23)
CO2: 22 mmol/L (ref 22–32)
Calcium: 7.6 mg/dL — ABNORMAL LOW (ref 8.9–10.3)
Chloride: 95 mmol/L — ABNORMAL LOW (ref 98–111)
Creatinine, Ser: 1.06 mg/dL (ref 0.61–1.24)
GFR, Estimated: >60 mL/min (ref >60)
Glucose, Bld: 209 mg/dL — ABNORMAL HIGH (ref 70–99)
Potassium: 4.2 mmol/L (ref 3.5–5.1)
Sodium: 130 mmol/L — ABNORMAL LOW (ref 135–145)
Total Bilirubin: 0.8 mg/dL (ref 0.3–1.2)
Total Protein: 6.4 g/dL — ABNORMAL LOW (ref 6.5–8.1)

## 2022-09-28 LAB — FOLATE: Folate: 32.8 ng/mL (ref >5.9)

## 2022-09-28 LAB — T4, FREE: Free T4: 0.88 ng/dL (ref 0.61–1.12)

## 2022-09-28 LAB — D-DIMER, QUANTITATIVE: D-Dimer, Quant: 0.62 ug{FEU}/mL — ABNORMAL HIGH (ref 0.00–0.50)

## 2022-09-28 LAB — GLUCOSE, CAPILLARY
Glucose-Capillary: 175 mg/dL — ABNORMAL HIGH (ref 70–99)
Glucose-Capillary: 253 mg/dL — ABNORMAL HIGH (ref 70–99)
Glucose-Capillary: 220 mg/dL — ABNORMAL HIGH (ref 70–99)
Glucose-Capillary: 272 mg/dL — ABNORMAL HIGH (ref 70–99)

## 2022-09-28 LAB — VITAMIN B12: Vitamin B-12: 582 pg/mL (ref 180–914)

## 2022-09-28 LAB — FERRITIN: Ferritin: 304 ng/mL (ref 24–336)

## 2022-09-28 LAB — C-REACTIVE PROTEIN: CRP: 12 mg/dL — ABNORMAL HIGH (ref <1.0)

## 2022-09-28 MED ORDER — ARFORMOTEROL TARTRATE 15 MCG/2ML IN NEBU
15.0000 ug | INHALATION_SOLUTION | Freq: Two times a day (BID) | RESPIRATORY_TRACT | Status: DC
  Administered 2022-09-28 – 2022-09-30 (×5): 15 ug via RESPIRATORY_TRACT
  Filled 2022-09-28 (×5): qty 2

## 2022-09-28 MED ORDER — METHYLPREDNISOLONE SODIUM SUCC 125 MG IJ SOLR
60.0000 mg | Freq: Two times a day (BID) | INTRAMUSCULAR | Status: DC
  Administered 2022-09-28 – 2022-09-30 (×5): 60 mg via INTRAVENOUS
  Filled 2022-09-28 (×5): qty 2

## 2022-09-28 MED ORDER — INSULIN ASPART 100 UNIT/ML IJ SOLN
0.0000 [IU] | Freq: Three times a day (TID) | INTRAMUSCULAR | Status: DC
  Administered 2022-09-28: 5 [IU] via SUBCUTANEOUS
  Administered 2022-09-28: 8 [IU] via SUBCUTANEOUS
  Administered 2022-09-29 (×2): 5 [IU] via SUBCUTANEOUS
  Administered 2022-09-29: 8 [IU] via SUBCUTANEOUS
  Administered 2022-09-30: 5 [IU] via SUBCUTANEOUS

## 2022-09-28 MED ORDER — BUDESONIDE 0.5 MG/2ML IN SUSP
0.5000 mg | Freq: Two times a day (BID) | RESPIRATORY_TRACT | Status: DC
  Administered 2022-09-28 – 2022-09-30 (×5): 0.5 mg via RESPIRATORY_TRACT
  Filled 2022-09-28 (×5): qty 2

## 2022-09-28 MED ORDER — INSULIN ASPART 100 UNIT/ML IJ SOLN
0.0000 [IU] | Freq: Every day | INTRAMUSCULAR | Status: DC
  Administered 2022-09-28: 3 [IU] via SUBCUTANEOUS
  Administered 2022-09-29: 2 [IU] via SUBCUTANEOUS

## 2022-09-28 MED ORDER — SODIUM CHLORIDE 0.9 % IV SOLN: INTRAVENOUS | Status: DC

## 2022-09-28 NOTE — Progress Notes (Addendum)
PROGRESS NOTE  George Anthony JXB:[id number] DOB: 02-04-1950 DOA: 09/27/2022 PCP: Sonny Masters, FNP  Brief History:  72 year old male with a history of diabetes mellitus type 2, hypertension, hyperlipidemia, lower extremity lymphedema, clear-cell renal cell carcinoma status post right nephrectomy and IVC thrombectomy (09/04/20), IVC thrombus (07/25/20) on apixaban, and tobacco abuse presenting with 2-day history of nasal congestion, coughing, shortness of breath, and generalized weakness.  This has been associated with decreased oral intake and nausea.  He has had some associated arthralgias and myalgias.  The patient went to urgent care on 09/27/2022.  The patient was noted to be hypoxic and his COVID 19 test was positive.  He was directed to the emergency department for further evaluation and treatment. He denies any headache, neck pain, chest pain, hemoptysis vomiting, diarrhea, abdominal pain, hematochezia, melena.  He continues to smoke 1/2 pack/day.  He has at least 50-pack-year history of tobacco.  In the ED, the patient had low-grade temperature of 99.7 F.  He was hemodynamically stable with oxygen saturation 87% on room air.  He was placed on 3 L with saturation of 97%.  CTA chest was negative for PE.  There was mild peripheral GGO.  There was enlarged bilateral hilar and mediastinal lymphadenopathy.  There was a new subtle irregular lesion in the right lobe of the liver.  There was mildly enlarged right periaortic and gastrohepatic ligament lymph nodes.  There was a peritoneal nodule.  There is a severe compression deformity of L5 associated with lytic lesion.  The patient was started on remdesivir and dexamethasone initially.   The patient follows up at [facility] for his right side renal cell carcinoma.  He last saw Dr. Thana Ates on 09/08/22.  In reviewing oncology notes, there is concern for possible recurrent RCC versus SLL/CLL.  It was noted that he had a new liver lesion on his  surveillance scans.  Initial biopsy was concerning for CLL, but repeat biopsy confirmation of RCC was documented.  CLL was felt to be a blood contaminant from initial biopsy.  He was started on Pembrolizumab Every 42 Days 06/09/2022.  Due delay accessing axitinib, he did not start the axitinib until July 2024.  On his surveillance scans of the CT AP the patient was noted to have Numerous prominent and mildly enlarged retroperitoneal and mesenteric lymph nodes, several of which appear enlarged relative to 12/05/2021, including a gastrohepatic lymph node which now measures up to 16 mm in short axis diameter (7/112), previously 11 mm. Interval enlargement of right pelvic sidewall, for example 15 mm lymph node (7/209), previously 13 mm.  There was also concern of splenic infarct.  There was a 3 cm fusiform infrarenal AAA.  Gynecology plan was to continue his Pembrolizumab and  axitinib and to return in 6 weeks.    Assessment/Plan: Acute respiratory failure with hypoxia -Secondary to COVID-19 pneumonia and COPD exacerbation -Stable on 3 L nasal cannula -CTA chest as discussed above -Wean oxygen for saturation greater than 88%  COVID-19 pneumonia -Continue remdesivir -Start IV Solu-Medrol -CRP -Ferritin -CTA chest negative for PE  COPD exacerbation -The patient does not have a listed diagnosis of COPD, but he has at least 50-pack-year history of tobacco and continues to smoke 1/2 pack/day -Start Brovana -Start Pulmicort -Start DuoNebs -IV Solu-Medrol  Renal cell carcinoma -Status post right nephrectomy 09/04/2020 -CT chest reveals mediastinal and intra-abdominal lymph nodes--these appear to be present from his previous surveillance scans at [facility] -Follow  up med/onc at [facility]  Scrotal cellulitis -He is already on ceftriaxone for his respiratory status -there is a component of yeast -InterDry for skin folds -No signs of necrotizing infection  IVC thrombus -Continue  apixaban  Controlled diabetes mellitus type 2 -Anticipate elevated CBGs secondary to steroids -09/27/2022 hemoglobin A1c 6.7 -NovoLog sliding scale -Hold metformin, glipizide, and Rybelsus  Essential hypertension -Holding lisinopril secondary to soft blood pressure  Mixed hyperlipidemia -Continue statin  Tobacco abuse -Tobacco cessation discussed  Thrombocytopenia -Secondary to acute infectious process -B12 -Folic acid  Hyponatremia -Secondary to volume depletion and poor solute intake -Continue normal saline  Obesity -BMI 33.87 -Lifestyle modification    Family Communication:  no  Family at bedside  Consultants: none  Code Status:  FULL   DVT Prophylaxis:  apixaban   Procedures: As Listed in Progress Note Above  Antibiotics: Ceftriaxone 9/7>>      Subjective: Patient states that his shortness of breath is little better.  He feels weak.  He has a nonproductive cough.  He denies any vomiting but has some nausea.  There is no diarrhea, abdominal pain, dysuria, hematuria, headache.  Objective: Vitals:   09/28/22 0156 09/28/22 0202 09/28/22 0458 09/28/22 0809  BP:  132/79 128/73 130/75  Pulse:  84 79   Resp:  (!) 22 (!) 22   Temp:  99.7 F (37.6 C) (!) 97.5 F (36.4 C)   TempSrc:  Oral Oral   SpO2: 95% 97% 96%   Weight:      Height:        Intake/Output Summary (Last 24 hours) at 09/28/2022 0852 Last data filed at 09/28/2022 0500 Gross per 24 hour  Intake 1610 ml  Output 600 ml  Net 1010 ml   Weight change:  Exam:  General:  Pt is alert, follows commands appropriately, not in acute distress HEENT: No icterus, No thrush, No neck mass, [place]/AT Cardiovascular: RRR, S1/S2, no rubs, no gallops Respiratory: Bilateral rales.  Bilateral wheezing. Abdomen: Soft/+BS, non tender, non distended, no guarding Extremities: No edema, No lymphangitis, No petechiae, No rashes, no synovitis   Data Reviewed: I have personally reviewed following labs and imaging  studies Basic Metabolic Panel: Recent Labs  Lab 09/27/22 1531 09/28/22 0502  NA 127* 130*  K 4.4 4.2  CL 93* 95*  CO2 24 22  GLUCOSE 118* 209*  BUN 24* 22  CREATININE 1.32* 1.06  CALCIUM 7.5* 7.6*   Liver Function Tests: Recent Labs  Lab 09/27/22 1531 09/28/22 0502  AST 18 15  ALT 16 16  ALKPHOS 84 83  BILITOT 1.2 0.8  PROT 6.4* 6.4*  ALBUMIN 3.1* 2.9*   Recent Labs  Lab 09/27/22 1531  LIPASE 16   No results for input(s): "AMMONIA" in the last 168 hours. Coagulation Profile: No results for input(s): "INR", "PROTIME" in the last 168 hours. CBC: Recent Labs  Lab 09/27/22 1531 09/28/22 0502  WBC 17.3* 17.6*  NEUTROABS 9.9*  --   HGB 13.1 13.1  HCT 41.8 41.7  MCV 91.9 91.6  PLT 126* 126*   Cardiac Enzymes: No results for input(s): "CKTOTAL", "CKMB", "CKMBINDEX", "TROPONINI" in the last 168 hours. BNP: Invalid input(s): "POCBNP" CBG: Recent Labs  Lab 09/27/22 2121 09/28/22 0807  GLUCAP 138* 175*   HbA1C: Recent Labs    09/27/22 1531  HGBA1C 6.7*   Urine analysis:    Component Value Date/Time   COLORURINE YELLOW 11/19/2011 0158   APPEARANCEUR CLEAR 11/19/2011 0158   LABSPEC >1.030 (H) 11/19/2011 0158  PHURINE 6.0 11/19/2011 0158   GLUCOSEU NEGATIVE 11/19/2011 0158   HGBUR MODERATE (A) 11/19/2011 0158   BILIRUBINUR MODERATE (A) 11/19/2011 0158   KETONESUR 40 (A) 11/19/2011 0158   PROTEINUR 30 (A) 11/19/2011 0158   UROBILINOGEN 2.0 (H) 11/19/2011 0158   NITRITE NEGATIVE 11/19/2011 0158   LEUKOCYTESUR NEGATIVE 11/19/2011 0158   Sepsis Labs: @LABRCNTIP (procalcitonin:4,lacticidven:4) )No results found for this or any previous visit (from the past 240 hour(s)).   Scheduled Meds:  apixaban  5 mg Oral BID   atorvastatin  40 mg Oral QHS   axitinib  5 mg Oral Q12H   escitalopram  10 mg Oral Daily   fluconazole  200 mg Oral Daily   folic acid  1 mg Oral Daily   insulin aspart  0-20 Units Subcutaneous TID WC   insulin aspart  0-5 Units  Subcutaneous QHS   insulin glargine-yfgn  20 Units Subcutaneous QHS   ipratropium-albuterol  3 mL Nebulization Q6H   lisinopril  10 mg Oral Daily   methylPREDNISolone (SOLU-MEDROL) injection  60 mg Intravenous Q12H   multivitamin with minerals  1 tablet Oral Daily   thiamine  100 mg Oral Daily   Continuous Infusions:  cefTRIAXone (ROCEPHIN)  IV 2 g (09/27/22 1758)   remdesivir 100 mg in sodium chloride 0.9 % 100 mL IVPB      Procedures/Studies: CT Angio Chest Pulmonary Embolism (PE) W or WO Contrast  Result Date: 09/27/2022 CLINICAL DATA:  Cough, shortness of breath, positive COVID; history of RCC; * Tracking Code: BO * EXAM: CT ANGIOGRAPHY CHEST WITH CONTRAST TECHNIQUE: Multidetector CT imaging of the chest was performed using the standard protocol during bolus administration of intravenous contrast. Multiplanar CT image reconstructions and MIPs were obtained to evaluate the vascular anatomy. RADIATION DOSE REDUCTION: This exam was performed according to the departmental dose-optimization program which includes automated exposure control, adjustment of the mA and/or kV according to patient size and/or use of iterative reconstruction technique. CONTRAST:  75mL OMNIPAQUE IOHEXOL 350 MG/ML SOLN COMPARISON:  CT chest, abdomen and pelvis dated August 09, 2020 FINDINGS: Cardiovascular: No evidence of pulmonary embolus. Evaluation of the segmental and subsegmental pulmonary arteries is limited due to streak and motion artifact. Apparent filling defect of a left upper lobe segmental pulmonary artery consistent with streak artifact artery. Normal heart size. No pericardial effusion. Normal caliber thoracic aorta with moderate atherosclerotic disease. Mediastinum/Nodes: Enlarged bilateral hilar and mediastinal lymph nodes, new when compared with the prior. Reference right hilar lymph node measuring 14 mm in short axis series 4, image 55, previously measured 8 mm. Reference AP window lymph node measuring 12 mm in  short axis on image 43, previously 5 mm. Lungs/Pleura: Central airways are patent. Mild bilateral bronchial wall thickening. Mild peripheral predominant ground-glass opacities more confluent areas of consolidation. Trace right pleural effusion bibasilar atelectasis. Stable solid pulmonary nodule of the right upper lobe measuring 4 mm on series 6, image 57, no specific follow-up imaging is necessary given greater than 1 year stability. Upper Abdomen: New subtle irregular lesion of the right lobe of the liver measuring 3.4 x 2.4 cm on series 5, image 242. additional new smaller lesion of the right hepatic lobe measuring 1.6 cm on series 5, image 270. Prior cholecystectomy. Mildly enlarged right para-aortic lymph node. Reference node measuring 11 mm in short axis on series 5, image 336. Appearing exophytic cyst of the posterior left kidney. Peritoneal nodule measuring 0.1 x 1.6 cm on series 5, image 301. Mildly enlarged gastrohepatic ligament lymph  node measuring 1.1 cm on series 5, image 262. Similar nodular thickening of the left adrenal gland. Musculoskeletal: Prior median sternotomy. Severe compression deformity of L5 with associated lytic lesion, slight increased height loss when compared with the prior exam. Review of the MIP images confirms the above findings. IMPRESSION: 1. No evidence of pulmonary embolus. Evaluation of the segmental and subsegmental pulmonary arteries is limited due to streak and motion artifact. 2. Mild peripheral predominant ground-glass opacities and more confluent areas of consolidation, consistent with history of COVID pneumonia. 3. New enlarged bilateral hilar and mediastinal lymph nodes, could be reactive, although finding is also concerning for concerning for progressive metastatic disease given history of renal cell carcinoma. 4. New subtle irregular lesions of the right lobe of the liver, concerning for hepatic metastatic disease. 5. Mildly enlarged right para-aortic and  gastrohepatic ligament lymph nodes, likely due to nodal metastatic disease. 6. New peritoneal nodule, concerning for peritoneal carcinomatosis. 7. Severe compression deformity of L5 with associated lytic lesion, slight increased height loss when compared with the prior exam, likely a pathologic fracture. 8. Aortic Atherosclerosis (ICD10-I70.0). Electronically Signed   By: Allegra Lai M.D.   On: 09/27/2022 18:26   DG Chest Portable 1 View  Result Date: 09/27/2022 CLINICAL DATA:  hypoxia, covid + EXAM: PORTABLE CHEST - 1 VIEW COMPARISON:  06/01/2020 and previous FINDINGS: Interval median sternotomy. Low lung volumes with patchy airspace opacities in the lung bases. Heart size and mediastinal contours are within normal limits. No effusion. IMPRESSION: Low lung volumes with patchy bibasilar airspace disease. Electronically Signed   By: Corlis Leak M.D.   On: 09/27/2022 16:12    Catarina Hartshorn, DO  [facility]  If 7PM-7AM, please contact night-coverage [email or web address] Password [email or web address] 09/28/2022, 8:52 AM   LOS: 1 day

## 2022-09-28 NOTE — Progress Notes (Signed)
   09/28/22 0949  TOC Brief Assessment  Insurance and Status Reviewed  Patient has primary care physician Yes  Home environment has been reviewed Home with spouse  Prior level of function: indepentent  Prior/Current Home Services No current home services  Social Determinants of Health Reivew SDOH reviewed no interventions necessary  Readmission risk has been reviewed Yes  Transition of care needs no transition of care needs at this time    Transition of Care Department [facility]) has reviewed patient and no TOC needs have been identified at this time. We will continue to monitor patient advancement through interdisciplinary progression rounds. If new patient transition needs arise, please place a TOC consult.

## 2022-09-28 NOTE — Hospital Course (Addendum)
72 year old male with a history of diabetes mellitus type 2, hypertension, hyperlipidemia, lower extremity lymphedema, clear-cell renal cell carcinoma status post right nephrectomy and IVC thrombectomy (09/04/20), IVC thrombus (07/25/20) on apixaban, and tobacco abuse presenting with 2-day history of nasal congestion, coughing, shortness of breath, and generalized weakness.  This has been associated with decreased oral intake and nausea.  He has had some associated arthralgias and myalgias.  The patient went to urgent care on 09/27/2022.  The patient was noted to be hypoxic and his COVID 19 test was positive.  He was directed to the emergency department for further evaluation and treatment. He denies any headache, neck pain, chest pain, hemoptysis vomiting, diarrhea, abdominal pain, hematochezia, melena.  He continues to smoke 1/2 pack/day.  He has at least 50-pack-year history of tobacco.  In the ED, the patient had low-grade temperature of 99.7 F.  He was hemodynamically stable with oxygen saturation 87% on room air.  He was placed on 3 L with saturation of 97%.  CTA chest was negative for PE.  There was mild peripheral GGO.  There was enlarged bilateral hilar and mediastinal lymphadenopathy.  There was a new subtle irregular lesion in the right lobe of the liver.  There was mildly enlarged right periaortic and gastrohepatic ligament lymph nodes.  There was a peritoneal nodule.  There is a severe compression deformity of L5 associated with lytic lesion.  The patient was started on remdesivir and dexamethasone initially.   The patient follows up at [facility] for his right side renal cell carcinoma.  He last saw Dr. Thana Ates on 09/08/22.  In reviewing oncology notes, there is concern for possible recurrent RCC versus SLL/CLL.  It was noted that he had a new liver lesion on his surveillance scans.  Initial biopsy was concerning for CLL, but repeat biopsy confirmation of RCC was documented.  CLL was felt to be a blood  contaminant from initial biopsy.  He was started on Pembrolizumab Every 42 Days 06/09/2022.  Due delay accessing axitinib, he did not start the axitinib until July 2024.  On his surveillance scans of the CT AP the patient was noted to have Numerous prominent and mildly enlarged retroperitoneal and mesenteric lymph nodes, several of which appear enlarged relative to 12/05/2021, including a gastrohepatic lymph node which now measures up to 16 mm in short axis diameter (7/112), previously 11 mm. Interval enlargement of right pelvic sidewall, for example 15 mm lymph node (7/209), previously 13 mm.  There was also concern of splenic infarct.  There was a 3 cm fusiform infrarenal AAA.  Med onc plan was to continue his Pembrolizumab and  axitinib and to return in 6 weeks.  I spoke with his oncology team, Dr. Howell Rucks, who states he can remain off of his axitinib for the next 3-4 days until patient recovers from COVID.  I instructed patient to resume it on 10/02/22

## 2022-09-29 DIAGNOSIS — J9601 Acute respiratory failure with hypoxia: Secondary | ICD-10-CM | POA: Diagnosis not present

## 2022-09-29 DIAGNOSIS — D696 Thrombocytopenia, unspecified: Secondary | ICD-10-CM | POA: Diagnosis not present

## 2022-09-29 DIAGNOSIS — U071 COVID-19: Secondary | ICD-10-CM | POA: Diagnosis not present

## 2022-09-29 LAB — GLUCOSE, CAPILLARY
Glucose-Capillary: 219 mg/dL — ABNORMAL HIGH (ref 70–99)
Glucose-Capillary: 291 mg/dL — ABNORMAL HIGH (ref 70–99)
Glucose-Capillary: 247 mg/dL — ABNORMAL HIGH (ref 70–99)
Glucose-Capillary: 244 mg/dL — ABNORMAL HIGH (ref 70–99)

## 2022-09-29 LAB — COMPREHENSIVE METABOLIC PANEL
ALT: 18 U/L (ref 0–44)
AST: 15 U/L (ref 15–41)
Albumin: 2.9 g/dL — ABNORMAL LOW (ref 3.5–5.0)
Alkaline Phosphatase: 78 U/L (ref 38–126)
Anion gap: 9 (ref 5–15)
BUN: 26 mg/dL — ABNORMAL HIGH (ref 8–23)
CO2: 23 mmol/L (ref 22–32)
Calcium: 7.7 mg/dL — ABNORMAL LOW (ref 8.9–10.3)
Chloride: 97 mmol/L — ABNORMAL LOW (ref 98–111)
Creatinine, Ser: 0.95 mg/dL (ref 0.61–1.24)
GFR, Estimated: >60 mL/min (ref >60)
Glucose, Bld: 210 mg/dL — ABNORMAL HIGH (ref 70–99)
Potassium: 4.2 mmol/L (ref 3.5–5.1)
Sodium: 129 mmol/L — ABNORMAL LOW (ref 135–145)
Total Bilirubin: 0.6 mg/dL (ref 0.3–1.2)
Total Protein: 6.2 g/dL — ABNORMAL LOW (ref 6.5–8.1)

## 2022-09-29 LAB — CBC
HCT: 39.6 % (ref 39.0–52.0)
Hemoglobin: 12.6 g/dL — ABNORMAL LOW (ref 13.0–17.0)
MCH: 29 pg (ref 26.0–34.0)
MCHC: 31.8 g/dL (ref 30.0–36.0)
MCV: 91 fL (ref 80.0–100.0)
Platelets: 139 10*3/uL — ABNORMAL LOW (ref 150–400)
RBC: 4.35 MIL/uL (ref 4.22–5.81)
RDW: 16.1 % — ABNORMAL HIGH (ref 11.5–15.5)
WBC: 18.7 10*3/uL — ABNORMAL HIGH (ref 4.0–10.5)
nRBC: 0 % (ref 0.0–0.2)

## 2022-09-29 LAB — C-REACTIVE PROTEIN: CRP: 7.5 mg/dL — ABNORMAL HIGH (ref <1.0)

## 2022-09-29 LAB — FERRITIN: Ferritin: 266 ng/mL (ref 24–336)

## 2022-09-29 MED ORDER — FLUCONAZOLE 100 MG PO TABS
100.0000 mg | ORAL_TABLET | Freq: Every day | ORAL | Status: DC
  Administered 2022-09-30: 100 mg via ORAL
  Filled 2022-09-29: qty 1

## 2022-09-29 MED ORDER — INSULIN ASPART 100 UNIT/ML IJ SOLN: 3.0000 [IU] | Freq: Three times a day (TID) | INTRAMUSCULAR | Status: DC

## 2022-09-29 MED ORDER — SODIUM CHLORIDE 0.9 % IV SOLN: INTRAVENOUS | Status: AC

## 2022-09-29 MED ORDER — INSULIN ASPART 100 UNIT/ML IJ SOLN
4.0000 [IU] | Freq: Three times a day (TID) | INTRAMUSCULAR | Status: DC
  Administered 2022-09-30: 4 [IU] via SUBCUTANEOUS

## 2022-09-29 NOTE — Plan of Care (Signed)
  Problem: Activity: Goal: Risk for activity intolerance will decrease Outcome: Progressing   Problem: Pain Managment: Goal: General experience of comfort will improve Outcome: Progressing   Problem: Clinical Measurements: Goal: Respiratory complications will improve Outcome: Progressing

## 2022-09-29 NOTE — Consult Note (Signed)
PHARMACY NOTE:  ANTIMICROBIAL RENAL DOSAGE ADJUSTMENT  Current antimicrobial regimen includes a mismatch between antimicrobial dosage and estimated renal function.  As per policy approved by the Pharmacy & Therapeutics and Medical Executive Committees, the antimicrobial dosage will be adjusted accordingly.  Current antimicrobial dosage:  Fluconazole 200 mg daily  Indication: Cutaneous candidiasis in scrotal area  Renal Function:  Estimated Creatinine Clearance: 79.6 mL/min (by C-G formula based on SCr of 0.95 mg/dL). []      On intermittent HD, scheduled: []      On CRRT    Antimicrobial dosage has been changed to:  Fluconazole 100 mg daily  Additional comments:   Thank you for allowing pharmacy to be a part of this patient's care.  Will M. Dareen Piano, PharmD Clinical Pharmacist 09/29/2022 12:48 PM

## 2022-09-29 NOTE — Inpatient Diabetes Management (Signed)
Inpatient Diabetes Program Recommendations  AACE/ADA: New Consensus Statement on Inpatient Glycemic Control Target Ranges:  Prepandial:   less than 140 mg/dL      Peak postprandial:   less than 180 mg/dL (1-2 hours)      Critically ill patients:  140 - 180 mg/dL    Latest Reference Range & Units 09/28/22 08:07 09/28/22 11:05 09/28/22 16:49 09/28/22 21:41 09/29/22 07:40 09/29/22 11:51  Glucose-Capillary 70 - 99 mg/dL 604 (H) 540 (H) 981 (H) 272 (H) 219 (H) 291 (H)   Review of Glycemic Control  Diabetes history: DM2 Outpatient Diabetes medications: Glipizide 5 mg BID, Metformin 1000 mg BID Current orders for Inpatient glycemic control: Semglee 20 units at bedtime, Novolog 0-15 units TID with meals, Novolog 0-5 units at bedtime; Solumedrol 60 mg Q12H  Inpatient Diabetes Program Recommendations:    Insulin: If steroids are continued, please consider ordering Novolog 4 units TID with meals for meal coverage if patient eats at least 50% of meals.  Thanks, Orlando Penner, RN, MSN, CDCES Diabetes Coordinator Inpatient Diabetes Program [phone] (Team Pager from 8am to 5pm)

## 2022-09-29 NOTE — Progress Notes (Signed)
PROGRESS NOTE  George Anthony HKV:[id number] DOB: [birth year] DOA: 09/27/2022 PCP: Sonny Masters, FNP  Brief History:  72 year old male with a history of diabetes mellitus type 2, hypertension, hyperlipidemia, lower extremity lymphedema, clear-cell renal cell carcinoma status post right nephrectomy and IVC thrombectomy (09/04/20), IVC thrombus (07/25/20) on apixaban, and tobacco abuse presenting with 2-day history of nasal congestion, coughing, shortness of breath, and generalized weakness.  This has been associated with decreased oral intake and nausea.  He has had some associated arthralgias and myalgias.  The patient went to urgent care on 09/27/2022.  The patient was noted to be hypoxic and his COVID 19 test was positive.  He was directed to the emergency department for further evaluation and treatment. He denies any headache, neck pain, chest pain, hemoptysis vomiting, diarrhea, abdominal pain, hematochezia, melena.  He continues to smoke 1/2 pack/day.  He has at least 50-pack-year history of tobacco.  In the ED, the patient had low-grade temperature of 99.7 F.  He was hemodynamically stable with oxygen saturation 87% on room air.  He was placed on 3 L with saturation of 97%.  CTA chest was negative for PE.  There was mild peripheral GGO.  There was enlarged bilateral hilar and mediastinal lymphadenopathy.  There was a new subtle irregular lesion in the right lobe of the liver.  There was mildly enlarged right periaortic and gastrohepatic ligament lymph nodes.  There was a peritoneal nodule.  There is a severe compression deformity of L5 associated with lytic lesion.  The patient was started on remdesivir and dexamethasone initially.   The patient follows up at [facility] for his right side renal cell carcinoma.  He last saw Dr. Thana Ates on 09/08/22.  In reviewing oncology notes, there is concern for possible recurrent RCC versus SLL/CLL.  It was noted that he had a new liver lesion on his  surveillance scans.  Initial biopsy was concerning for CLL, but repeat biopsy confirmation of RCC was documented.  CLL was felt to be a blood contaminant from initial biopsy.  He was started on Pembrolizumab Every 42 Days 06/09/2022.  Due delay accessing axitinib, he did not start the axitinib until July 2024.  On his surveillance scans of the CT AP the patient was noted to have Numerous prominent and mildly enlarged retroperitoneal and mesenteric lymph nodes, several of which appear enlarged relative to 12/05/2021, including a gastrohepatic lymph node which now measures up to 16 mm in short axis diameter (7/112), previously 11 mm. Interval enlargement of right pelvic sidewall, for example 15 mm lymph node (7/209), previously 13 mm.  There was also concern of splenic infarct.  There was a 3 cm fusiform infrarenal AAA.  Gynecology plan was to continue his Pembrolizumab and  axitinib and to return in 6 weeks.    Assessment/Plan:  Acute respiratory failure with hypoxia -Secondary to COVID-19 pneumonia and COPD exacerbation -Stable on 3 L nasal cannula>>RA -CTA chest as discussed above -Wean oxygen for saturation greater than 88%   COVID-19 pneumonia -Continue remdesivir -continue IV Solu-Medrol -CRP12.0>>7.5 -Ferritin 304>>266 -CTA chest negative for PE   COPD exacerbation -The patient does not have a listed diagnosis of COPD, but he has at least 50-pack-year history of tobacco and continues to smoke 1/2 pack/day -Started Brovana -Started Pulmicort -Started DuoNebs -IV Solu-Medrol   Renal cell carcinoma -Status post right nephrectomy 09/04/2020 -CT chest reveals mediastinal and intra-abdominal lymph nodes--these appear to be present from his previous  surveillance scans at [facility] -Follow up med/onc at [facility]   Scrotal cellulitis -He is already on ceftriaxone for his respiratory status -there is a component of yeast -InterDry for skin folds -No signs of necrotizing infection   IVC  thrombus -Continue apixaban   Controlled diabetes mellitus type 2 -Anticipate elevated CBGs secondary to steroids -09/27/2022 hemoglobin A1c 6.7 -NovoLog sliding scale -Hold metformin, glipizide, and Rybelsus   Essential hypertension -Holding lisinopril secondary to soft blood pressure   Mixed hyperlipidemia -Continue statin   Tobacco abuse -Tobacco cessation discussed   Thrombocytopenia -Secondary to acute infectious process -B12--582 -Folic acid--32.8 -TSH 0.467   Hyponatremia -Secondary to volume depletion and poor solute intake -Continue normal saline>>improving   Obesity -BMI 33.87 -Lifestyle modification       Family Communication:  spouse updated 9/9   Consultants: none   Code Status:  FULL    DVT Prophylaxis:  apixaban     Procedures: As Listed in Progress Note Above   Antibiotics: Ceftriaxone 9/7>>         Subjective: Patient denies fevers, chills, headache, chest pain, dyspnea, nausea, vomiting, diarrhea, abdominal pain, dysuria, hematuria, hematochezia, and melena.   Objective: Vitals:   09/29/22 0716 09/29/22 0717 09/29/22 1402 09/29/22 1419  BP:   133/72   Pulse:   86   Resp:   17   Temp:   97.8 F (36.6 C)   TempSrc:   Oral   SpO2: 98% 98% 94% 95%  Weight:      Height:        Intake/Output Summary (Last 24 hours) at 09/29/2022 1817 Last data filed at 09/29/2022 1300 Gross per 24 hour  Intake 1472.28 ml  Output 1000 ml  Net 472.28 ml   Weight change:  Exam:  General:  Pt is alert, follows commands appropriately, not in acute distress HEENT: No icterus, No thrush, No neck mass, [place]/AT Cardiovascular: RRR, S1/S2, no rubs, no gallops Respiratory: bibasilar crackles.  No wheeze Abdomen: Soft/+BS, non tender, non distended, no guarding Extremities: No edema, No lymphangitis, No petechiae, No rashes, no synovitis   Data Reviewed: I have personally reviewed following labs and imaging studies Basic Metabolic Panel: Recent Labs   Lab 09/27/22 1531 09/28/22 0502 09/29/22 0519  NA 127* 130* 129*  K 4.4 4.2 4.2  CL 93* 95* 97*  CO2 24 22 23   GLUCOSE 118* 209* 210*  BUN 24* 22 26*  CREATININE 1.32* 1.06 0.95  CALCIUM 7.5* 7.6* 7.7*   Liver Function Tests: Recent Labs  Lab 09/27/22 1531 09/28/22 0502 09/29/22 0519  AST 18 15 15   ALT 16 16 18   ALKPHOS 84 83 78  BILITOT 1.2 0.8 0.6  PROT 6.4* 6.4* 6.2*  ALBUMIN 3.1* 2.9* 2.9*   Recent Labs  Lab 09/27/22 1531  LIPASE 16   No results for input(s): "AMMONIA" in the last 168 hours. Coagulation Profile: No results for input(s): "INR", "PROTIME" in the last 168 hours. CBC: Recent Labs  Lab 09/27/22 1531 09/28/22 0502 09/29/22 0519  WBC 17.3* 17.6* 18.7*  NEUTROABS 9.9*  --   --   HGB 13.1 13.1 12.6*  HCT 41.8 41.7 39.6  MCV 91.9 91.6 91.0  PLT 126* 126* 139*   Cardiac Enzymes: No results for input(s): "CKTOTAL", "CKMB", "CKMBINDEX", "TROPONINI" in the last 168 hours. BNP: Invalid input(s): "POCBNP" CBG: Recent Labs  Lab 09/28/22 1649 09/28/22 2141 09/29/22 0740 09/29/22 1151 09/29/22 1650  GLUCAP 220* 272* 219* 291* 247*   HbA1C: Recent Labs  09/27/22 1531  HGBA1C 6.7*   Urine analysis:    Component Value Date/Time   COLORURINE YELLOW 09/28/2022 1201   APPEARANCEUR CLEAR 09/28/2022 1201   LABSPEC 1.014 09/28/2022 1201   PHURINE 6.0 09/28/2022 1201   GLUCOSEU >=500 (A) 09/28/2022 1201   HGBUR SMALL (A) 09/28/2022 1201   BILIRUBINUR NEGATIVE 09/28/2022 1201   KETONESUR 5 (A) 09/28/2022 1201   PROTEINUR 30 (A) 09/28/2022 1201   UROBILINOGEN 2.0 (H) 11/19/2011 0158   NITRITE NEGATIVE 09/28/2022 1201   LEUKOCYTESUR NEGATIVE 09/28/2022 1201   Sepsis Labs: @LABRCNTIP (procalcitonin:4,lacticidven:4) )No results found for this or any previous visit (from the past 240 hour(s)).   Scheduled Meds:  apixaban  5 mg Oral BID   arformoterol  15 mcg Nebulization BID   atorvastatin  40 mg Oral QHS   budesonide (PULMICORT)  nebulizer solution  0.5 mg Nebulization BID   escitalopram  10 mg Oral Daily   [START ON 09/30/2022] fluconazole  100 mg Oral Daily   folic acid  1 mg Oral Daily   insulin aspart  0-15 Units Subcutaneous TID WC   insulin aspart  0-5 Units Subcutaneous QHS   insulin glargine-yfgn  20 Units Subcutaneous QHS   ipratropium-albuterol  3 mL Nebulization Q6H   methylPREDNISolone (SOLU-MEDROL) injection  60 mg Intravenous Q12H   multivitamin with minerals  1 tablet Oral Daily   thiamine  100 mg Oral Daily   Continuous Infusions:  cefTRIAXone (ROCEPHIN)  IV 2 g (09/29/22 1731)    Procedures/Studies: CT Angio Chest Pulmonary Embolism (PE) W or WO Contrast  Result Date: 09/27/2022 CLINICAL DATA:  Cough, shortness of breath, positive COVID; history of RCC; * Tracking Code: BO * EXAM: CT ANGIOGRAPHY CHEST WITH CONTRAST TECHNIQUE: Multidetector CT imaging of the chest was performed using the standard protocol during bolus administration of intravenous contrast. Multiplanar CT image reconstructions and MIPs were obtained to evaluate the vascular anatomy. RADIATION DOSE REDUCTION: This exam was performed according to the departmental dose-optimization program which includes automated exposure control, adjustment of the mA and/or kV according to patient size and/or use of iterative reconstruction technique. CONTRAST:  75mL OMNIPAQUE IOHEXOL 350 MG/ML SOLN COMPARISON:  CT chest, abdomen and pelvis dated August 09, 2020 FINDINGS: Cardiovascular: No evidence of pulmonary embolus. Evaluation of the segmental and subsegmental pulmonary arteries is limited due to streak and motion artifact. Apparent filling defect of a left upper lobe segmental pulmonary artery consistent with streak artifact artery. Normal heart size. No pericardial effusion. Normal caliber thoracic aorta with moderate atherosclerotic disease. Mediastinum/Nodes: Enlarged bilateral hilar and mediastinal lymph nodes, new when compared with the prior.  Reference right hilar lymph node measuring 14 mm in short axis series 4, image 55, previously measured 8 mm. Reference AP window lymph node measuring 12 mm in short axis on image 43, previously 5 mm. Lungs/Pleura: Central airways are patent. Mild bilateral bronchial wall thickening. Mild peripheral predominant ground-glass opacities more confluent areas of consolidation. Trace right pleural effusion bibasilar atelectasis. Stable solid pulmonary nodule of the right upper lobe measuring 4 mm on series 6, image 57, no specific follow-up imaging is necessary given greater than 1 year stability. Upper Abdomen: New subtle irregular lesion of the right lobe of the liver measuring 3.4 x 2.4 cm on series 5, image 242. additional new smaller lesion of the right hepatic lobe measuring 1.6 cm on series 5, image 270. Prior cholecystectomy. Mildly enlarged right para-aortic lymph node. Reference node measuring 11 mm in short axis on series 5, image  336. Appearing exophytic cyst of the posterior left kidney. Peritoneal nodule measuring 0.1 x 1.6 cm on series 5, image 301. Mildly enlarged gastrohepatic ligament lymph node measuring 1.1 cm on series 5, image 262. Similar nodular thickening of the left adrenal gland. Musculoskeletal: Prior median sternotomy. Severe compression deformity of L5 with associated lytic lesion, slight increased height loss when compared with the prior exam. Review of the MIP images confirms the above findings. IMPRESSION: 1. No evidence of pulmonary embolus. Evaluation of the segmental and subsegmental pulmonary arteries is limited due to streak and motion artifact. 2. Mild peripheral predominant ground-glass opacities and more confluent areas of consolidation, consistent with history of COVID pneumonia. 3. New enlarged bilateral hilar and mediastinal lymph nodes, could be reactive, although finding is also concerning for concerning for progressive metastatic disease given history of renal cell carcinoma.  4. New subtle irregular lesions of the right lobe of the liver, concerning for hepatic metastatic disease. 5. Mildly enlarged right para-aortic and gastrohepatic ligament lymph nodes, likely due to nodal metastatic disease. 6. New peritoneal nodule, concerning for peritoneal carcinomatosis. 7. Severe compression deformity of L5 with associated lytic lesion, slight increased height loss when compared with the prior exam, likely a pathologic fracture. 8. Aortic Atherosclerosis (ICD10-I70.0). Electronically Signed   By: Allegra Lai M.D.   On: 09/27/2022 18:26   DG Chest Portable 1 View  Result Date: 09/27/2022 CLINICAL DATA:  hypoxia, covid + EXAM: PORTABLE CHEST - 1 VIEW COMPARISON:  06/01/2020 and previous FINDINGS: Interval median sternotomy. Low lung volumes with patchy airspace opacities in the lung bases. Heart size and mediastinal contours are within normal limits. No effusion. IMPRESSION: Low lung volumes with patchy bibasilar airspace disease. Electronically Signed   By: Corlis Leak M.D.   On: 09/27/2022 16:12    Catarina Hartshorn, DO  [facility]  If 7PM-7AM, please contact night-coverage [email or web address] Password [email or web address] 09/29/2022, 6:17 PM   LOS: 2 days

## 2022-09-29 NOTE — Evaluation (Signed)
Physical Therapy Evaluation Patient Details Name: George Anthony MRN: [id number] DOB: 1950-05-06 Today's Date: 09/29/2022  History of Present Illness  George Anthony is a 72 y.o. male with medical history significant of history of DVT in the setting of renal cell carcinoma, recurrence of renal cell carcinoma currently on chemotherapy, hypertension, diabetes, diabetic nephropathy, hyperlipidemia.  Patient also states that he has chronic leukemia, although there is no comment on this that I could see on the oncology notes in Care Everywhere.  Patient having shortness of breath, cough, wheezing for the past 2 days.  He has been fatigued and extremely tired and has been unable to do much of anything.  He went to the urgent care for evaluation.  They did a COVID test and saw that he was hypoxic and recommended that he come here for evaluation.  He has bodyaches, back pain, chest pressure.  On arrival, his oxygen saturation was in the 80s on room air.  This responded to oxygen via nasal cannula to the mid 90s.  Chest x-ray shows multifocal pneumonia.  White count is 17 with increased neutrophils.   Clinical Impression  Patient functioning near baseline for functional mobility and gait demonstrating good return for bed mobility, transfers and ambulating in room without loss of balance, on room air with SpO2 at 90-93%.  Patient left on room air and instructed to check his SpO2 with vitals machine and to use his supplemental O2 if becomes SOB and/or SpO2 drops - RN notified.  Plan:  Patient discharged from physical therapy to care of nursing for ambulation daily as tolerated for length of stay.          If plan is discharge home, recommend the following: Help with stairs or ramp for entrance   Can travel by private vehicle        Equipment Recommendations None recommended by PT  Recommendations for Other Services       Functional Status Assessment Patient has not had a recent decline in their  functional status     Precautions / Restrictions Precautions Precautions: None Restrictions Weight Bearing Restrictions: No      Mobility  Bed Mobility Overal bed mobility: Independent                  Transfers Overall transfer level: Independent                      Ambulation/Gait Ambulation/Gait assistance: Modified independent (Device/Increase time) Gait Distance (Feet): 80 Feet Assistive device: None Gait Pattern/deviations: WFL(Within Functional Limits) Gait velocity: slightly decreased     General Gait Details: grossly WFL with good return for ambulating in room without loss of balance, on room air with SpO2 at 90-93%  Stairs            Wheelchair Mobility     Tilt Bed    Modified Rankin (Stroke Patients Only)       Balance Overall balance assessment: No apparent balance deficits (not formally assessed)                                           Pertinent Vitals/Pain Pain Assessment Pain Assessment: No/denies pain    Home Living Family/patient expects to be discharged to:: Private residence Living Arrangements: Spouse/significant other Available Help at Discharge: Family;Available PRN/intermittently Type of Home: House Home Access: Stairs to enter Entrance Stairs-Rails:  Right Entrance Stairs-Number of Steps: 12   Home Layout: One level        Prior Function Prior Level of Function : Independent/Modified Independent;Driving             Mobility Comments: community ambulator without AD ADLs Comments: Independent     Extremity/Trunk Assessment   Upper Extremity Assessment Upper Extremity Assessment: Overall WFL for tasks assessed    Lower Extremity Assessment Lower Extremity Assessment: Overall WFL for tasks assessed    Cervical / Trunk Assessment Cervical / Trunk Assessment: Normal  Communication   Communication Communication: No apparent difficulties  Cognition Arousal:  Alert Behavior During Therapy: WFL for tasks assessed/performed Overall Cognitive Status: Within Functional Limits for tasks assessed                                          General Comments      Exercises     Assessment/Plan    PT Assessment Patient does not need any further PT services  PT Problem List         PT Treatment Interventions      PT Goals (Current goals can be found in the Care Plan section)  Acute Rehab PT Goals Patient Stated Goal: return home with family to assist PT Goal Formulation: With patient/family Time For Goal Achievement: 09/29/22 Potential to Achieve Goals: Good    Frequency       Co-evaluation               AM-PAC PT "6 Clicks" Mobility  Outcome Measure Help needed turning from your back to your side while in a flat bed without using bedrails?: None Help needed moving from lying on your back to sitting on the side of a flat bed without using bedrails?: None Help needed moving to and from a bed to a chair (including a wheelchair)?: None Help needed standing up from a chair using your arms (e.g., wheelchair or bedside chair)?: None Help needed to walk in hospital room?: None Help needed climbing 3-5 steps with a railing? : A Little 6 Click Score: 23    End of Session   Activity Tolerance: Patient tolerated treatment well;Patient limited by fatigue Patient left: in bed;with call bell/phone within reach;with family/visitor present Nurse Communication: Mobility status PT Visit Diagnosis: Unsteadiness on feet (R26.81);Other abnormalities of gait and mobility (R26.89);Muscle weakness (generalized) (M62.81)    Time: 2440-1027 PT Time Calculation (min) (ACUTE ONLY): 20 min   Charges:   PT Evaluation $PT Eval Moderate Complexity: 1 Mod PT Treatments $Therapeutic Activity: 8-22 mins PT General Charges $$ ACUTE PT VISIT: 1 Visit         3:21 PM, 09/29/22 Ocie Bob, MPT Physical Therapist with  [facility] [address] [phone] office [phone] mobile phone

## 2022-09-29 NOTE — Plan of Care (Signed)
  Problem: Education: Goal: Knowledge of General Education information will improve Description: Including pain rating scale, medication(s)/side effects and non-pharmacologic comfort measures Outcome: Progressing   Problem: Clinical Measurements: Goal: Respiratory complications will improve Outcome: Progressing   Problem: Activity: Goal: Risk for activity intolerance will decrease Outcome: Progressing   Problem: Nutrition: Goal: Adequate nutrition will be maintained Outcome: Progressing   Problem: Pain Managment: Goal: General experience of comfort will improve Outcome: Progressing   Problem: Safety: Goal: Ability to remain free from injury will improve Outcome: Progressing   

## 2022-09-30 DIAGNOSIS — D696 Thrombocytopenia, unspecified: Secondary | ICD-10-CM | POA: Diagnosis not present

## 2022-09-30 DIAGNOSIS — U071 COVID-19: Secondary | ICD-10-CM | POA: Diagnosis not present

## 2022-09-30 DIAGNOSIS — N492 Inflammatory disorders of scrotum: Secondary | ICD-10-CM | POA: Diagnosis not present

## 2022-09-30 DIAGNOSIS — J9601 Acute respiratory failure with hypoxia: Secondary | ICD-10-CM | POA: Diagnosis not present

## 2022-09-30 LAB — BASIC METABOLIC PANEL
Anion gap: 9 (ref 5–15)
BUN: 29 mg/dL — ABNORMAL HIGH (ref 8–23)
CO2: 19 mmol/L — ABNORMAL LOW (ref 22–32)
Calcium: 7.6 mg/dL — ABNORMAL LOW (ref 8.9–10.3)
Chloride: 101 mmol/L (ref 98–111)
Creatinine, Ser: 1.02 mg/dL (ref 0.61–1.24)
GFR, Estimated: >60 mL/min (ref >60)
Glucose, Bld: 264 mg/dL — ABNORMAL HIGH (ref 70–99)
Potassium: 3.9 mmol/L (ref 3.5–5.1)
Sodium: 129 mmol/L — ABNORMAL LOW (ref 135–145)

## 2022-09-30 LAB — MAGNESIUM: Magnesium: 2 mg/dL (ref 1.7–2.4)

## 2022-09-30 LAB — GLUCOSE, CAPILLARY: Glucose-Capillary: 238 mg/dL — ABNORMAL HIGH (ref 70–99)

## 2022-09-30 LAB — FERRITIN: Ferritin: 218 ng/mL (ref 24–336)

## 2022-09-30 LAB — C-REACTIVE PROTEIN: CRP: 3.9 mg/dL — ABNORMAL HIGH (ref <1.0)

## 2022-09-30 MED ORDER — PREDNISONE 50 MG PO TABS: 50.0000 mg | ORAL_TABLET | Freq: Every day | ORAL | 0 refills | Status: DC

## 2022-09-30 MED ORDER — CEFADROXIL 1 G PO TABS: 1.0000 g | ORAL_TABLET | Freq: Two times a day (BID) | ORAL | 0 refills | Status: DC

## 2022-09-30 MED ORDER — PREDNISONE 20 MG PO TABS: 50.0000 mg | ORAL_TABLET | Freq: Every day | ORAL | Status: DC

## 2022-09-30 NOTE — Discharge Summary (Signed)
Physician Discharge Summary   Patient: George Anthony MRN: [id number] DOB: [birth year]  Admit date:     09/27/2022  Discharge date: 09/30/22  Discharge Physician: Onalee Hua Alexandra Posadas   PCP: Sonny Masters, FNP   Recommendations at discharge:   Please follow up with primary care provider within 1-2 weeks  Please repeat BMP and CBC in one week     Hospital Course: 72 year old male with a history of diabetes mellitus type 2, hypertension, hyperlipidemia, lower extremity lymphedema, clear-cell renal cell carcinoma status post right nephrectomy and IVC thrombectomy (09/04/20), IVC thrombus (07/25/20) on apixaban, and tobacco abuse presenting with 2-day history of nasal congestion, coughing, shortness of breath, and generalized weakness.  This has been associated with decreased oral intake and nausea.  He has had some associated arthralgias and myalgias.  The patient went to urgent care on 09/27/2022.  The patient was noted to be hypoxic and his COVID 19 test was positive.  He was directed to the emergency department for further evaluation and treatment. He denies any headache, neck pain, chest pain, hemoptysis vomiting, diarrhea, abdominal pain, hematochezia, melena.  He continues to smoke 1/2 pack/day.  He has at least 50-pack-year history of tobacco.  In the ED, the patient had low-grade temperature of 99.7 F.  He was hemodynamically stable with oxygen saturation 87% on room air.  He was placed on 3 L with saturation of 97%.  CTA chest was negative for PE.  There was mild peripheral GGO.  There was enlarged bilateral hilar and mediastinal lymphadenopathy.  There was a new subtle irregular lesion in the right lobe of the liver.  There was mildly enlarged right periaortic and gastrohepatic ligament lymph nodes.  There was a peritoneal nodule.  There is a severe compression deformity of L5 associated with lytic lesion.  The patient was started on remdesivir and dexamethasone initially.   The patient follows up at  [facility] for his right side renal cell carcinoma.  He last saw Dr. Thana Ates on 09/08/22.  In reviewing oncology notes, there is concern for possible recurrent RCC versus SLL/CLL.  It was noted that he had a new liver lesion on his surveillance scans.  Initial biopsy was concerning for CLL, but repeat biopsy confirmation of RCC was documented.  CLL was felt to be a blood contaminant from initial biopsy.  He was started on Pembrolizumab Every 42 Days 06/09/2022.  Due delay accessing axitinib, he did not start the axitinib until July 2024.  On his surveillance scans of the CT AP the patient was noted to have Numerous prominent and mildly enlarged retroperitoneal and mesenteric lymph nodes, several of which appear enlarged relative to 12/05/2021, including a gastrohepatic lymph node which now measures up to 16 mm in short axis diameter (7/112), previously 11 mm. Interval enlargement of right pelvic sidewall, for example 15 mm lymph node (7/209), previously 13 mm.  There was also concern of splenic infarct.  There was a 3 cm fusiform infrarenal AAA.  Med onc plan was to continue his Pembrolizumab and  axitinib and to return in 6 weeks.  I spoke with his oncology team, Dr. Howell Rucks, who states he can remain off of his axitinib for the next 3-4 days until patient recovers from COVID.  I instructed patient to resume it on 10/02/22   Assessment and Plan: Acute respiratory failure with hypoxia -Secondary to COVID-19 pneumonia and COPD exacerbation -Stable on 3 L nasal cannula>>RA -CTA chest as discussed above -Wean oxygen for saturation greater than 88% -patient  ambulated on RA on day of d/c without desaturation <91%   COVID-19 pneumonia -Continue remdesivir>>received 3 days -continue IV Solu-Medrol>>d/c home with prednisone 50 mg daily x 4 more days  -CRP12.0>>7.5>>3.9 -Ferritin 304>>266>>218 -CTA chest negative for PE   COPD exacerbation -The patient does not have a listed diagnosis of COPD, but he has at  least 50-pack-year history of tobacco and continues to smoke 1/2 pack/day -Started Brovana -Started Pulmicort -Started DuoNebs -IV Solu-Medrol>>d/c home with prednisone 50 mg daily x 4 more days after d/c   Renal cell carcinoma -Status post right nephrectomy 09/04/2020 -CT chest reveals mediastinal and intra-abdominal lymph nodes--these appear to be present from his previous surveillance scans at [facility] -Follow up med/onc at [facility]   Scrotal cellulitis -He is already on ceftriaxone for his respiratory status -there is a component of yeast -InterDry for skin folds -No signs of necrotizing infection -d/c home with cephalexin x 4 more days   IVC thrombus -Continue apixaban   Controlled diabetes mellitus type 2 -Anticipate elevated CBGs secondary to steroids -09/27/2022 hemoglobin A1c 6.7 -NovoLog sliding scale -Hold metformin, glipizide, and Rybelsus>>resumed after d/c   Essential hypertension -Holding lisinopril secondary to soft blood pressure -resume after dc as BP back up    Mixed hyperlipidemia -Continue statin   Tobacco abuse -Tobacco cessation discussed   Thrombocytopenia -Secondary to acute infectious process -B12--582 -Folic acid--32.8 -TSH 0.467   Hyponatremia -Secondary to volume depletion and poor solute intake -Continue normal saline>>improving -Na 129 on day of dc   Obesity -BMI 33.87 -Lifestyle modification      Consultants: none Procedures performed: none  Disposition: Home Diet recommendation:  Carb modified diet DISCHARGE MEDICATION: Allergies as of 09/30/2022   No Known Allergies      Medication List     TAKE these medications    acetaminophen 500 MG tablet Commonly known as: TYLENOL Take 1,300 mg by mouth every 6 (six) hours as needed for moderate pain. 650 mg tab   apixaban 5 MG Tabs tablet Commonly known as: Eliquis Take 1 tablet (5 mg total) by mouth 2 (two) times daily.   atorvastatin 40 MG tablet Commonly known as:  LIPITOR TAKE 1 TABLET BY MOUTH AT BEDTIME   axitinib 5 MG tablet Commonly known as: INLYTA Take 5 mg by mouth every 12 (twelve) hours.   cefadroxil 1 g tablet Commonly known as: DURICEF Take 1 tablet (1 g total) by mouth 2 (two) times daily.   escitalopram 10 MG tablet Commonly known as: LEXAPRO Take 1 tablet (10 mg total) by mouth daily.   glipiZIDE 5 MG tablet Commonly known as: GLUCOTROL Take 1 tablet (5 mg total) by mouth 2 (two) times daily before a meal.   glucose blood test strip Check TID and PRN dx E11.9   lisinopril 10 MG tablet Commonly known as: ZESTRIL Take 1 tablet by mouth once daily   metFORMIN 1000 MG tablet Commonly known as: GLUCOPHAGE Take 1 tablet (1,000 mg total) by mouth 2 (two) times daily with a meal.   predniSONE 50 MG tablet Commonly known as: DELTASONE Take 1 tablet (50 mg total) by mouth daily with breakfast. Start taking on: October 01, 2022        Discharge Exam: Ceasar Mons Weights   09/27/22 1432 09/27/22 1830  Weight: 99.8 kg 98.1 kg   HEENT:  [place]/AT, No thrush, no icterus CV:  RRR, no rub, no S3, no S4 Lung:  bibasilar rales. No wheeze Abd:  soft/+BS, NT Ext:  trace LE edema,  no lymphangitis, no synovitis, no rash   Condition at discharge: stable  The results of significant diagnostics from this hospitalization (including imaging, microbiology, ancillary and laboratory) are listed below for reference.   Imaging Studies: CT Angio Chest Pulmonary Embolism (PE) W or WO Contrast  Result Date: 09/27/2022 CLINICAL DATA:  Cough, shortness of breath, positive COVID; history of RCC; * Tracking Code: BO * EXAM: CT ANGIOGRAPHY CHEST WITH CONTRAST TECHNIQUE: Multidetector CT imaging of the chest was performed using the standard protocol during bolus administration of intravenous contrast. Multiplanar CT image reconstructions and MIPs were obtained to evaluate the vascular anatomy. RADIATION DOSE REDUCTION: This exam was performed according to  the departmental dose-optimization program which includes automated exposure control, adjustment of the mA and/or kV according to patient size and/or use of iterative reconstruction technique. CONTRAST:  75mL OMNIPAQUE IOHEXOL 350 MG/ML SOLN COMPARISON:  CT chest, abdomen and pelvis dated August 09, 2020 FINDINGS: Cardiovascular: No evidence of pulmonary embolus. Evaluation of the segmental and subsegmental pulmonary arteries is limited due to streak and motion artifact. Apparent filling defect of a left upper lobe segmental pulmonary artery consistent with streak artifact artery. Normal heart size. No pericardial effusion. Normal caliber thoracic aorta with moderate atherosclerotic disease. Mediastinum/Nodes: Enlarged bilateral hilar and mediastinal lymph nodes, new when compared with the prior. Reference right hilar lymph node measuring 14 mm in short axis series 4, image 55, previously measured 8 mm. Reference AP window lymph node measuring 12 mm in short axis on image 43, previously 5 mm. Lungs/Pleura: Central airways are patent. Mild bilateral bronchial wall thickening. Mild peripheral predominant ground-glass opacities more confluent areas of consolidation. Trace right pleural effusion bibasilar atelectasis. Stable solid pulmonary nodule of the right upper lobe measuring 4 mm on series 6, image 57, no specific follow-up imaging is necessary given greater than 1 year stability. Upper Abdomen: New subtle irregular lesion of the right lobe of the liver measuring 3.4 x 2.4 cm on series 5, image 242. additional new smaller lesion of the right hepatic lobe measuring 1.6 cm on series 5, image 270. Prior cholecystectomy. Mildly enlarged right para-aortic lymph node. Reference node measuring 11 mm in short axis on series 5, image 336. Appearing exophytic cyst of the posterior left kidney. Peritoneal nodule measuring 0.1 x 1.6 cm on series 5, image 301. Mildly enlarged gastrohepatic ligament lymph node measuring 1.1 cm on  series 5, image 262. Similar nodular thickening of the left adrenal gland. Musculoskeletal: Prior median sternotomy. Severe compression deformity of L5 with associated lytic lesion, slight increased height loss when compared with the prior exam. Review of the MIP images confirms the above findings. IMPRESSION: 1. No evidence of pulmonary embolus. Evaluation of the segmental and subsegmental pulmonary arteries is limited due to streak and motion artifact. 2. Mild peripheral predominant ground-glass opacities and more confluent areas of consolidation, consistent with history of COVID pneumonia. 3. New enlarged bilateral hilar and mediastinal lymph nodes, could be reactive, although finding is also concerning for concerning for progressive metastatic disease given history of renal cell carcinoma. 4. New subtle irregular lesions of the right lobe of the liver, concerning for hepatic metastatic disease. 5. Mildly enlarged right para-aortic and gastrohepatic ligament lymph nodes, likely due to nodal metastatic disease. 6. New peritoneal nodule, concerning for peritoneal carcinomatosis. 7. Severe compression deformity of L5 with associated lytic lesion, slight increased height loss when compared with the prior exam, likely a pathologic fracture. 8. Aortic Atherosclerosis (ICD10-I70.0). Electronically Signed   By: Allegra Lai  M.D.   On: 09/27/2022 18:26   DG Chest Portable 1 View  Result Date: 09/27/2022 CLINICAL DATA:  hypoxia, covid + EXAM: PORTABLE CHEST - 1 VIEW COMPARISON:  06/01/2020 and previous FINDINGS: Interval median sternotomy. Low lung volumes with patchy airspace opacities in the lung bases. Heart size and mediastinal contours are within normal limits. No effusion. IMPRESSION: Low lung volumes with patchy bibasilar airspace disease. Electronically Signed   By: Corlis Leak M.D.   On: 09/27/2022 16:12    Microbiology: Results for orders placed or performed during the hospital encounter of 08/09/20   Resp Panel by RT-PCR (Flu A&B, Covid) Nasopharyngeal Swab     Status: None   Collection Time: 08/09/20  1:49 PM   Specimen: Nasopharyngeal Swab; Nasopharyngeal(NP) swabs in vial transport medium  Result Value Ref Range Status   SARS Coronavirus 2 by RT PCR NEGATIVE NEGATIVE Final    Comment: (NOTE) SARS-CoV-2 target nucleic acids are NOT DETECTED.  The SARS-CoV-2 RNA is generally detectable in upper respiratory specimens during the acute phase of infection. The lowest concentration of SARS-CoV-2 viral copies this assay can detect is 138 copies/mL. A negative result does not preclude SARS-Cov-2 infection and should not be used as the sole basis for treatment or other patient management decisions. A negative result may occur with  improper specimen collection/handling, submission of specimen other than nasopharyngeal swab, presence of viral mutation(s) within the areas targeted by this assay, and inadequate number of viral copies(<138 copies/mL). A negative result must be combined with clinical observations, patient history, and epidemiological information. The expected result is Negative.  Fact Sheet for Patients:  BloggerCourse.com  Fact Sheet for Healthcare Providers:  SeriousBroker.it  This test is no t yet approved or cleared by the [place] FDA and  has been authorized for detection and/or diagnosis of SARS-CoV-2 by FDA under an Emergency Use Authorization (EUA). This EUA will remain  in effect (meaning this test can be used) for the duration of the COVID-19 declaration under Section 564(b)(1) of the Act, 21 U.S.C.section 360bbb-3(b)(1), unless the authorization is terminated  or revoked sooner.       Influenza A by PCR NEGATIVE NEGATIVE Final   Influenza B by PCR NEGATIVE NEGATIVE Final    Comment: (NOTE) The Xpert Xpress SARS-CoV-2/FLU/RSV plus assay is intended as an aid in the diagnosis of influenza from  Nasopharyngeal swab specimens and should not be used as a sole basis for treatment. Nasal washings and aspirates are unacceptable for Xpert Xpress SARS-CoV-2/FLU/RSV testing.  Fact Sheet for Patients: BloggerCourse.com  Fact Sheet for Healthcare Providers: SeriousBroker.it  This test is not yet approved or cleared by the [place] FDA and has been authorized for detection and/or diagnosis of SARS-CoV-2 by FDA under an Emergency Use Authorization (EUA). This EUA will remain in effect (meaning this test can be used) for the duration of the COVID-19 declaration under Section 564(b)(1) of the Act, 21 U.S.C. section 360bbb-3(b)(1), unless the authorization is terminated or revoked.  Performed at [facility], [address] [place], [place] [address]     Labs: CBC: Recent Labs  Lab 09/27/22 1531 09/28/22 0502 09/29/22 0519  WBC 17.3* 17.6* 18.7*  NEUTROABS 9.9*  --   --   HGB 13.1 13.1 12.6*  HCT 41.8 41.7 39.6  MCV 91.9 91.6 91.0  PLT 126* 126* 139*   Basic Metabolic Panel: Recent Labs  Lab 09/27/22 1531 09/28/22 0502 09/29/22 0519 09/30/22 0500  NA 127* 130* 129* 129*  K 4.4 4.2  4.2 3.9  CL 93* 95* 97* 101  CO2 24 22 23  19*  GLUCOSE 118* 209* 210* 264*  BUN 24* 22 26* 29*  CREATININE 1.32* 1.06 0.95 1.02  CALCIUM 7.5* 7.6* 7.7* 7.6*  MG  --   --   --  2.0   Liver Function Tests: Recent Labs  Lab 09/27/22 1531 09/28/22 0502 09/29/22 0519  AST 18 15 15   ALT 16 16 18   ALKPHOS 84 83 78  BILITOT 1.2 0.8 0.6  PROT 6.4* 6.4* 6.2*  ALBUMIN 3.1* 2.9* 2.9*   CBG: Recent Labs  Lab 09/29/22 0740 09/29/22 1151 09/29/22 1650 09/29/22 2140 09/30/22 0737  GLUCAP 219* 291* 247* 244* 238*    Discharge time spent: greater than 30 minutes.  Signed: Catarina Hartshorn, MD [facility] 09/30/2022

## 2022-09-30 NOTE — TOC Transition Note (Signed)
Transition of Care [facility]) - CM/SW Discharge Note   Patient Details  Name: George Anthony MRN: [id number] Date of Birth: 1950-08-12  [facility] CM/SW Contact:  Leitha Bleak, RN Phone Number: 09/30/2022, 10:22 AM   Clinical Narrative:   Discharging home, 94% on Room air today. No needs.    Final next level of care: Home/Self Care Barriers to Discharge: No Barriers Identified  Patient Goals and CMS Choice   Discharge Placement     Discharge Plan and Services Additional resources added to the After Visit Summary for       Social Determinants of Health (SDOH) Interventions SDOH Screenings   Food Insecurity: No Food Insecurity (09/27/2022)  Housing: Low Risk  (09/27/2022)  Transportation Needs: No Transportation Needs (09/27/2022)  Utilities: Not At Risk (09/27/2022)  Alcohol Screen: Low Risk  (04/09/2022)  Depression (PHQ2-9): Low Risk  (06/19/2022)  Financial Resource Strain: Low Risk  (04/09/2022)  Physical Activity: Inactive (04/09/2022)  Social Connections: Moderately Isolated (04/09/2022)  Stress: No Stress Concern Present (04/09/2022)  Tobacco Use: High Risk (09/27/2022)   Readmission Risk Interventions     No data to display

## 2022-09-30 NOTE — Progress Notes (Signed)
Nsg Discharge Note  Admit Date:  09/27/2022 Discharge date: 09/30/2022   George Anthony to be D/C'd Home per MD order.  AVS completed.  Copy for chart, and copy for patient signed, and dated. Patient/caregiver able to verbalize understanding.  Discharge Medication: Allergies as of 09/30/2022   No Known Allergies      Medication List     TAKE these medications    acetaminophen 500 MG tablet Commonly known as: TYLENOL Take 1,300 mg by mouth every 6 (six) hours as needed for moderate pain. 650 mg tab   apixaban 5 MG Tabs tablet Commonly known as: Eliquis Take 1 tablet (5 mg total) by mouth 2 (two) times daily.   atorvastatin 40 MG tablet Commonly known as: LIPITOR TAKE 1 TABLET BY MOUTH AT BEDTIME   axitinib 5 MG tablet Commonly known as: INLYTA Take 5 mg by mouth every 12 (twelve) hours.   cefadroxil 1 g tablet Commonly known as: DURICEF Take 1 tablet (1 g total) by mouth 2 (two) times daily.   escitalopram 10 MG tablet Commonly known as: LEXAPRO Take 1 tablet (10 mg total) by mouth daily.   glipiZIDE 5 MG tablet Commonly known as: GLUCOTROL Take 1 tablet (5 mg total) by mouth 2 (two) times daily before a meal.   glucose blood test strip Check TID and PRN dx E11.9   lisinopril 10 MG tablet Commonly known as: ZESTRIL Take 1 tablet by mouth once daily   metFORMIN 1000 MG tablet Commonly known as: GLUCOPHAGE Take 1 tablet (1,000 mg total) by mouth 2 (two) times daily with a meal.   predniSONE 50 MG tablet Commonly known as: DELTASONE Take 1 tablet (50 mg total) by mouth daily with breakfast. Start taking on: October 01, 2022        Discharge Assessment: Vitals:   09/30/22 0715 09/30/22 0800  BP:  135/75  Pulse:  90  Resp:  19  Temp:    SpO2: 94% 94%   Skin clean, dry and intact without evidence of skin break down, no evidence of skin tears noted. IV catheter discontinued intact. Site without signs and symptoms of complications - no redness or  edema noted at insertion site, patient denies c/o pain - only slight tenderness at site.  Dressing with slight pressure applied.  D/c Instructions-Education: Discharge instructions given to patient/family with verbalized understanding. D/c education completed with patient/family including follow up instructions, medication list, d/c activities limitations if indicated, with other d/c instructions as indicated by MD - patient able to verbalize understanding, all questions fully answered. Patient instructed to return to ED, call 911, or call MD for any changes in condition.  Patient escorted via WC, and D/C home via private auto.  George Clock [name],LPN  09/18/5619 30:86 AM

## 2022-10-01 ENCOUNTER — Telehealth: Payer: Self-pay

## 2022-10-01 NOTE — Transitions of Care (Post Inpatient/ED Visit) (Signed)
   10/01/2022  Name: George Anthony MRN: [id number] DOB: [birth year]  Today's TOC FU Call Status: Today's TOC FU Call Status:: Unsuccessful Call (1st Attempt) Unsuccessful Call (1st Attempt) Date: 10/01/22  Attempted to reach the patient regarding the most recent Inpatient/ED visit.  Follow Up Plan: Additional outreach attempts will be made to reach the patient to complete the Transitions of Care (Post Inpatient/ED visit) call.   Jodelle Gross RN, BSN, CCM [facility] RN Care Coordinator/ Transitions of Care Direct Dial: [phone]  Fax: [phone]

## 2022-10-02 ENCOUNTER — Ambulatory Visit: Payer: Medicare Other | Admitting: Family Medicine

## 2022-10-03 ENCOUNTER — Telehealth: Payer: Self-pay

## 2022-10-03 LAB — PATHOLOGIST SMEAR REVIEW

## 2022-10-03 NOTE — Transitions of Care (Post Inpatient/ED Visit) (Signed)
10/03/2022  Name: George Anthony MRN: [id number] DOB: [birth year]  Today's TOC FU Call Status: Today's TOC FU Call Status:: Successful TOC FU Call Completed TOC FU Call Complete Date: 09/30/22 Patient's Name and Date of Birth confirmed.  Transition Care Management Follow-up Telephone Call Date of Discharge: 09/30/22 Discharge Facility: [facility] ([facility] Type of Discharge: Inpatient Admission Primary Inpatient Discharge Diagnosis:: Acute Respiratory Failure with Hypoxia Secondary to Covid-19 How have you been since you were released from the hospital?: Better (Patient notes he continues with cough but is much better) Any questions or concerns?: No  Items Reviewed: Did you receive and understand the discharge instructions provided?: Yes Medications obtained,verified, and reconciled?: Yes (Medications Reviewed) Any new allergies since your discharge?: No Dietary orders reviewed?: Yes Type of Diet Ordered:: Low Sodium Heart Healthy Do you have support at home?: Yes People in Home: spouse Name of Support/Comfort Primary Source: [name]  Medications Reviewed Today: Medications Reviewed Today     Reviewed by Jodelle Gross, RN (Case Manager) on 10/03/22 at 1008  Med List Status: <None>   Medication Order Taking? Sig Documenting Provider Last Dose Status Informant  acetaminophen (TYLENOL) 500 MG tablet [id number] Yes Take 1,300 mg by mouth every 6 (six) hours as needed for moderate pain. 650 mg tab [provider] Taking Active Spouse/Significant Other, Self  apixaban (ELIQUIS) 5 MG TABS tablet [id number] Yes Take 1 tablet (5 mg total) by mouth 2 (two) times daily. Sonny Masters, FNP Taking Active Spouse/Significant Other, Self  atorvastatin (LIPITOR) 40 MG tablet [id number] Yes TAKE 1 TABLET BY MOUTH AT BEDTIME Rakes, Doralee Albino, FNP Taking Active Spouse/Significant Other, Self  axitinib (INLYTA) 5 MG tablet [id number] Yes Take 5 mg by mouth every 12 (twelve) hours. [provider] Taking Active Spouse/Significant Other, Self  cefadroxil (DURICEF) 1 g tablet [id number] Yes Take 1 tablet (1 g total) by mouth 2 (two) times daily. Catarina Hartshorn, MD Taking Active   escitalopram (LEXAPRO) 10 MG tablet [id number] Yes Take 1 tablet (10 mg total) by mouth daily. Sonny Masters, FNP Taking Active Spouse/Significant Other, Self  glipiZIDE (GLUCOTROL) 5 MG tablet [id number] Yes Take 1 tablet (5 mg total) by mouth 2 (two) times daily before a meal. Rakes, Doralee Albino, FNP Taking Active Spouse/Significant Other, Self  glucose blood test strip [id number] Yes Check TID and PRN dx E11.9 Rakes, Doralee Albino, FNP Taking Active Spouse/Significant Other, Self  lisinopril (ZESTRIL) 10 MG tablet [id number] Yes Take 1 tablet by mouth once daily Rakes, Doralee Albino, FNP Taking Active Spouse/Significant Other, Self  metFORMIN (GLUCOPHAGE) 1000 MG tablet [id number] Yes Take 1 tablet (1,000 mg total) by mouth 2 (two) times daily with a meal. Rakes, Doralee Albino, FNP Taking Active Spouse/Significant Other, Self  predniSONE (DELTASONE) 50 MG tablet [id number] Yes Take 1 tablet (50 mg total) by mouth daily with breakfast. Catarina Hartshorn, MD Taking Active             Home Care and Equipment/Supplies: Were Home Health Services Ordered?: No Any new equipment or medical supplies ordered?: No  Functional Questionnaire: Do you need assistance with bathing/showering or dressing?: No Do you need assistance with meal preparation?: No Do you need assistance with eating?: No Do you have difficulty maintaining continence: No Do you need assistance with getting out of bed/getting out of a chair/moving?: No Do you have difficulty managing or taking your medications?: No  Follow up appointments reviewed: PCP Follow-up appointment confirmed?: Yes Date of PCP follow-up appointment?: 10/13/22 Follow-up Provider:  Gilford Silvius, NP Specialist Hospital Follow-up appointment confirmed?: NA Do you need transportation to your follow-up  appointment?: No Do you understand care options if your condition(s) worsen?: Yes-patient verbalized understanding  SDOH Interventions Today    Flowsheet Row Most Recent Value  SDOH Interventions   Food Insecurity Interventions Intervention Not Indicated  Housing Interventions Intervention Not Indicated     Jodelle Gross RN, BSN, CCM [facility] RN Care Coordinator/ Transitions of Care Direct Dial: [phone]  Fax: [phone]

## 2022-10-10 ENCOUNTER — Inpatient Hospital Stay: Payer: Medicare Other | Admitting: Family Medicine

## 2022-10-14 DIAGNOSIS — Z951 Presence of aortocoronary bypass graft: Secondary | ICD-10-CM | POA: Diagnosis not present

## 2022-10-14 DIAGNOSIS — K2289 Other specified disease of esophagus: Secondary | ICD-10-CM | POA: Diagnosis not present

## 2022-10-14 DIAGNOSIS — C641 Malignant neoplasm of right kidney, except renal pelvis: Secondary | ICD-10-CM | POA: Diagnosis not present

## 2022-10-14 DIAGNOSIS — K769 Liver disease, unspecified: Secondary | ICD-10-CM | POA: Diagnosis not present

## 2022-10-14 DIAGNOSIS — K7689 Other specified diseases of liver: Secondary | ICD-10-CM | POA: Diagnosis not present

## 2022-10-14 DIAGNOSIS — J9811 Atelectasis: Secondary | ICD-10-CM | POA: Diagnosis not present

## 2022-10-14 DIAGNOSIS — R918 Other nonspecific abnormal finding of lung field: Secondary | ICD-10-CM | POA: Diagnosis not present

## 2022-10-14 DIAGNOSIS — Z905 Acquired absence of kidney: Secondary | ICD-10-CM | POA: Diagnosis not present

## 2022-10-16 ENCOUNTER — Encounter: Payer: Self-pay | Admitting: Family Medicine

## 2022-10-16 ENCOUNTER — Other Ambulatory Visit: Payer: Self-pay | Admitting: *Deleted

## 2022-10-16 ENCOUNTER — Ambulatory Visit (INDEPENDENT_AMBULATORY_CARE_PROVIDER_SITE_OTHER): Payer: Medicare Other | Admitting: Family Medicine

## 2022-10-16 VITALS — BP 130/81 | HR 98 | Temp 97.0°F | Ht 67.0 in | Wt 220.6 lb

## 2022-10-16 DIAGNOSIS — J1282 Pneumonia due to coronavirus disease 2019: Secondary | ICD-10-CM | POA: Diagnosis not present

## 2022-10-16 DIAGNOSIS — R059 Cough, unspecified: Secondary | ICD-10-CM

## 2022-10-16 DIAGNOSIS — S32050A Wedge compression fracture of fifth lumbar vertebra, initial encounter for closed fracture: Secondary | ICD-10-CM | POA: Diagnosis not present

## 2022-10-16 DIAGNOSIS — Z794 Long term (current) use of insulin: Secondary | ICD-10-CM

## 2022-10-16 DIAGNOSIS — U071 COVID-19: Secondary | ICD-10-CM | POA: Diagnosis not present

## 2022-10-16 DIAGNOSIS — C641 Malignant neoplasm of right kidney, except renal pelvis: Secondary | ICD-10-CM

## 2022-10-16 DIAGNOSIS — Z09 Encounter for follow-up examination after completed treatment for conditions other than malignant neoplasm: Secondary | ICD-10-CM | POA: Diagnosis not present

## 2022-10-16 MED ORDER — BENZONATATE 100 MG PO CAPS
200.0000 mg | ORAL_CAPSULE | Freq: Three times a day (TID) | ORAL | 0 refills | Status: DC | PRN
Start: 2022-10-16 — End: 2023-10-23

## 2022-10-16 MED ORDER — GLIPIZIDE 5 MG PO TABS: 5.0000 mg | ORAL_TABLET | Freq: Two times a day (BID) | ORAL | 3 refills | Status: DC

## 2022-10-16 MED ORDER — TRAMADOL HCL 50 MG PO TABS
50.0000 mg | ORAL_TABLET | Freq: Three times a day (TID) | ORAL | 0 refills | Status: AC | PRN
Start: 2022-10-16 — End: 2022-10-21

## 2022-10-16 NOTE — Progress Notes (Signed)
Subjective:  Patient ID: George Anthony, male    DOB: 23-Sep-1950, 72 y.o.   MRN: [id number]  Patient Care Team: Sonny Masters, FNP as PCP - General (Family Medicine) Lanelle Bal, DO as Consulting Physician (Internal Medicine) Michaelle Copas, MD as Referring Physician (Optometry)   Chief Complaint:  Hospitalization Follow-up (//09/27/2022 - 09/30/2022 (3 days)/[facility]- Acute respiratory failure with hypoxia (HCC)) and Back Pain   HPI: George Anthony is a 72 y.o. male presenting on 10/16/2022 for Hospitalization Follow-up (//09/27/2022 - 09/30/2022 (3 days)/[facility]- Acute respiratory failure with hypoxia (HCC)) and Back Pain   Pt presents today for hospital discharge follow up. He was admitted to [facility] on 09/27/2022 for COVID 19 and associated respiratory failure with hypoxia and pneumonia. CT scan during admission revealed a severe L5 compression fracture, likely from lytic lesion. That is his biggest concern today as he has continued back pain. He has recovered from COVID and pneumonia. States he still has a dry cough at times. No shortness of breath, fever, chills, or increased fatigue. He has follow up with oncology scheduled on Monday to go over imaging results.  States pain back is sharp to shooting and aching. Worse with certain movements or coughing. No loss of bowel or bladder, no saddle anesthesia or leg paresthesias/numbness. He has been taking Tylenol with minimal relief of symptoms.    Relevant past medical, surgical, family, and social history reviewed and updated as indicated.  Allergies and medications reviewed and updated. Data reviewed: Chart in Epic.   Past Medical History:  Diagnosis Date   Anxiety    Diabetes mellitus without complication (HCC)    Diabetic nephropathy associated with type 2 diabetes mellitus (HCC) 02/21/2021   DVT (deep venous thrombosis) (HCC)    Enlarged heart    Hyperlipidemia    Hypertension    Renal cell  carcinoma [facility])     Past Surgical History:  Procedure Laterality Date   BIOPSY  09/16/2019   Procedure: BIOPSY;  Surgeon: Lanelle Bal, DO;  Location: [facility];  Service: Endoscopy;;   COLONOSCOPY WITH PROPOFOL N/A 09/16/2019   Procedure: COLONOSCOPY WITH PROPOFOL;  Surgeon: Lanelle Bal, DO;  Location: [facility];  Service: Endoscopy;  Laterality: N/A;  7:30am   POLYPECTOMY  09/16/2019   Procedure: POLYPECTOMY;  Surgeon: Lanelle Bal, DO;  Location: [facility];  Service: Endoscopy;;    Social History   Socioeconomic History   Marital status: Married    Spouse name: charlene   Number of children: 1   Years of education: Not on file   Highest education level: Not on file  Occupational History    Employer: [employer]   Occupation: Retired  Tobacco Use   Smoking status: Every Day    Current packs/day: 1.00    Average packs/day: 1 pack/day for 40.0 years (40.0 ttl pk-yrs)    Types: Cigarettes   Smokeless tobacco: Never  Vaping Use   Vaping status: Never Used  Substance and Sexual Activity   Alcohol use: Yes    Alcohol/week: 3.0 standard drinks of alcohol    Types: 3 Cans of beer per week    Comment: drinking a couple times a month for the last year.- used to drink 2 12 packs a week   Drug use: No   Sexual activity: Yes  Other Topics Concern   Not on file  Social History Narrative   Not on file  Social Determinants of Health   Financial Resource Strain: Low Risk  (04/09/2022)   Overall Financial Resource Strain (CARDIA)    Difficulty of Paying Living Expenses: Not hard at all  Food Insecurity: No Food Insecurity (10/03/2022)   Hunger Vital Sign    Worried About Running Out of Food in the Last Year: Never true    Ran Out of Food in the Last Year: Never true  Transportation Needs: No Transportation Needs (09/27/2022)   PRAPARE - Administrator, Civil Service (Medical): No    Lack of Transportation (Non-Medical): No  Physical  Activity: Inactive (04/09/2022)   Exercise Vital Sign    Days of Exercise per Week: 0 days    Minutes of Exercise per Session: 0 min  Stress: No Stress Concern Present (04/09/2022)   [employer] of Occupational Health - Occupational Stress Questionnaire    Feeling of Stress : Not at all  Social Connections: Moderately Isolated (04/09/2022)   Social Connection and Isolation Panel [NHANES]    Frequency of Communication with Friends and Family: More than three times a week    Frequency of Social Gatherings with Friends and Family: More than three times a week    Attends Religious Services: Never    Database administrator or Organizations: No    Attends Banker Meetings: Never    Marital Status: Married  Catering manager Violence: Not At Risk (09/27/2022)   Humiliation, Afraid, Rape, and Kick questionnaire    Fear of Current or Ex-Partner: No    Emotionally Abused: No    Physically Abused: No    Sexually Abused: No    Outpatient Encounter Medications as of 10/16/2022  Medication Sig   acetaminophen (TYLENOL) 500 MG tablet Take 1,300 mg by mouth every 6 (six) hours as needed for moderate pain. 650 mg tab   apixaban (ELIQUIS) 5 MG TABS tablet Take 1 tablet (5 mg total) by mouth 2 (two) times daily.   atorvastatin (LIPITOR) 40 MG tablet TAKE 1 TABLET BY MOUTH AT BEDTIME   axitinib (INLYTA) 5 MG tablet Take 5 mg by mouth every 12 (twelve) hours.   benzonatate (TESSALON PERLES) 100 MG capsule Take 2 capsules (200 mg total) by mouth 3 (three) times daily as needed.   cefadroxil (DURICEF) 1 g tablet Take 1 tablet (1 g total) by mouth 2 (two) times daily.   escitalopram (LEXAPRO) 10 MG tablet Take 1 tablet (10 mg total) by mouth daily.   glucose blood test strip Check TID and PRN dx E11.9   lisinopril (ZESTRIL) 10 MG tablet Take 1 tablet by mouth once daily   metFORMIN (GLUCOPHAGE) 1000 MG tablet Take 1 tablet (1,000 mg total) by mouth 2 (two) times daily with a meal.   traMADol  (ULTRAM) 50 MG tablet Take 1 tablet (50 mg total) by mouth every 8 (eight) hours as needed for up to 5 days.   [DISCONTINUED] glipiZIDE (GLUCOTROL) 5 MG tablet Take 1 tablet (5 mg total) by mouth 2 (two) times daily before a meal.   [DISCONTINUED] predniSONE (DELTASONE) 50 MG tablet Take 1 tablet (50 mg total) by mouth daily with breakfast.   No facility-administered encounter medications on file as of 10/16/2022.    No Known Allergies  Review of Systems  Constitutional:  Positive for activity change, appetite change and fatigue. Negative for chills, diaphoresis, fever and unexpected weight change.  Respiratory:  Positive for cough. Negative for apnea, choking, chest tightness, shortness of breath, wheezing and stridor.  Cardiovascular:  Positive for leg swelling. Negative for chest pain and palpitations.  Gastrointestinal:  Positive for abdominal distention (chronic).  Musculoskeletal:  Positive for arthralgias and back pain. Negative for gait problem.  All other systems reviewed and are negative.       Objective:  BP 130/81   Pulse 98   Temp (!) 97 F (36.1 C) (Temporal)   Ht 5\' 7"  (1.702 m)   Wt 220 lb 9.6 oz (100.1 kg)   SpO2 92%   BMI 34.55 kg/m    Wt Readings from Last 3 Encounters:  10/16/22 220 lb 9.6 oz (100.1 kg)  09/27/22 216 lb 4.3 oz (98.1 kg)  06/19/22 230 lb 9.6 oz (104.6 kg)    Physical Exam Vitals and nursing note reviewed.  Constitutional:      General: He is not in acute distress.    Appearance: He is obese. He is ill-appearing (chronically ill). He is not toxic-appearing or diaphoretic.  HENT:     Head: Normocephalic and atraumatic.     Mouth/Throat:     Mouth: Mucous membranes are moist.  Eyes:     Pupils: Pupils are equal, round, and reactive to light.  Cardiovascular:     Rate and Rhythm: Normal rate and regular rhythm.     Heart sounds: Normal heart sounds.  Pulmonary:     Effort: Pulmonary effort is normal.     Breath sounds: Decreased  breath sounds present. No rhonchi or rales.  Abdominal:     General: There is distension.     Tenderness: There is no abdominal tenderness.  Musculoskeletal:     Cervical back: Normal.     Thoracic back: Decreased range of motion.     Lumbar back: Decreased range of motion.     Right lower leg: Edema present.     Left lower leg: Edema present.  Skin:    General: Skin is warm and dry.     Capillary Refill: Capillary refill takes less than 2 seconds.  Neurological:     General: No focal deficit present.     Mental Status: He is alert and oriented to person, place, and time.     Gait: Gait abnormal (slow, antalgic).  Psychiatric:        Mood and Affect: Mood normal.        Behavior: Behavior normal.        Thought Content: Thought content normal.        Judgment: Judgment normal.     Results for orders placed or performed during the hospital encounter of 09/27/22  CBC with Differential  Result Value Ref Range   WBC 17.3 (H) 4.0 - 10.5 K/uL   RBC 4.55 4.22 - 5.81 MIL/uL   Hemoglobin 13.1 13.0 - 17.0 g/dL   HCT 08.6 57.8 - 46.9 %   MCV 91.9 80.0 - 100.0 fL   MCH 28.8 26.0 - 34.0 pg   MCHC 31.3 30.0 - 36.0 g/dL   RDW 62.9 (H) 52.8 - 41.3 %   Platelets 126 (L) 150 - 400 K/uL   nRBC 0.0 0.0 - 0.2 %   Neutrophils Relative % 57 %   Neutro Abs 9.9 (H) 1.7 - 7.7 K/uL   Lymphocytes Relative 41 %   Lymphs Abs 7.1 (H) 0.7 - 4.0 K/uL   Monocytes Relative 2 %   Monocytes Absolute 0.3 0.1 - 1.0 K/uL   Eosinophils Relative 0 %   Eosinophils Absolute 0.0 0.0 - 0.5 K/uL   Basophils Relative  0 %   Basophils Absolute 0.0 0.0 - 0.1 K/uL   RBC Morphology MORPHOLOGY UNREMARKABLE    Smear Review MORPHOLOGY UNREMARKABLE    Abs Immature Granulocytes 0.00 0.00 - 0.07 K/uL   Reactive, Benign Lymphocytes PRESENT    Smudge Cells PRESENT   Comprehensive metabolic panel  Result Value Ref Range   Sodium 127 (L) 135 - 145 mmol/L   Potassium 4.4 3.5 - 5.1 mmol/L   Chloride 93 (L) 98 - 111 mmol/L    CO2 24 22 - 32 mmol/L   Glucose, Bld 118 (H) 70 - 99 mg/dL   BUN 24 (H) 8 - 23 mg/dL   Creatinine, Ser 3.61 (H) 0.61 - 1.24 mg/dL   Calcium 7.5 (L) 8.9 - 10.3 mg/dL   Total Protein 6.4 (L) 6.5 - 8.1 g/dL   Albumin 3.1 (L) 3.5 - 5.0 g/dL   AST 18 15 - 41 U/L   ALT 16 0 - 44 U/L   Alkaline Phosphatase 84 38 - 126 U/L   Total Bilirubin 1.2 0.3 - 1.2 mg/dL   GFR, Estimated 58 (L) >60 mL/min   Anion gap 10 5 - 15  Lipase, blood  Result Value Ref Range   Lipase 16 11 - 51 U/L  Pathologist smear review  Result Value Ref Range   Path Review Reviewed by Miguel Rota, M.D.   Procalcitonin  Result Value Ref Range   Procalcitonin 0.13 ng/mL  Hemoglobin A1c  Result Value Ref Range   Hgb A1c MFr Bld 6.7 (H) 4.8 - 5.6 %   Mean Plasma Glucose 145.59 mg/dL  CBC  Result Value Ref Range   WBC 17.6 (H) 4.0 - 10.5 K/uL   RBC 4.55 4.22 - 5.81 MIL/uL   Hemoglobin 13.1 13.0 - 17.0 g/dL   HCT 44.3 15.4 - 00.8 %   MCV 91.6 80.0 - 100.0 fL   MCH 28.8 26.0 - 34.0 pg   MCHC 31.4 30.0 - 36.0 g/dL   RDW 67.6 (H) 19.5 - 09.3 %   Platelets 126 (L) 150 - 400 K/uL   nRBC 0.0 0.0 - 0.2 %  Comprehensive metabolic panel  Result Value Ref Range   Sodium 130 (L) 135 - 145 mmol/L   Potassium 4.2 3.5 - 5.1 mmol/L   Chloride 95 (L) 98 - 111 mmol/L   CO2 22 22 - 32 mmol/L   Glucose, Bld 209 (H) 70 - 99 mg/dL   BUN 22 8 - 23 mg/dL   Creatinine, Ser 2.67 0.61 - 1.24 mg/dL   Calcium 7.6 (L) 8.9 - 10.3 mg/dL   Total Protein 6.4 (L) 6.5 - 8.1 g/dL   Albumin 2.9 (L) 3.5 - 5.0 g/dL   AST 15 15 - 41 U/L   ALT 16 0 - 44 U/L   Alkaline Phosphatase 83 38 - 126 U/L   Total Bilirubin 0.8 0.3 - 1.2 mg/dL   GFR, Estimated >12 >45 mL/min   Anion gap 13 5 - 15  Glucose, capillary  Result Value Ref Range   Glucose-Capillary 138 (H) 70 - 99 mg/dL  Glucose, capillary  Result Value Ref Range   Glucose-Capillary 175 (H) 70 - 99 mg/dL  C-reactive protein  Result Value Ref Range   CRP 12.0 (H) <1.0 mg/dL   Ferritin  Result Value Ref Range   Ferritin 304 24 - 336 ng/mL  D-dimer, quantitative  Result Value Ref Range   D-Dimer, Quant 0.62 (H) 0.00 - 0.50 ug/mL-FEU  Vitamin B12  Result Value  Ref Range   Vitamin B-12 582 180 - 914 pg/mL  Folate  Result Value Ref Range   Folate 32.8 >5.9 ng/mL  TSH  Result Value Ref Range   TSH 0.467 0.350 - 4.500 uIU/mL  T4, free  Result Value Ref Range   Free T4 0.88 0.61 - 1.12 ng/dL  Urinalysis, w/ Reflex to Culture (Infection Suspected) -Urine, Clean Catch  Result Value Ref Range   Specimen Source URINE, CLEAN CATCH    Color, Urine YELLOW YELLOW   APPearance CLEAR CLEAR   Specific Gravity, Urine 1.014 1.005 - 1.030   pH 6.0 5.0 - 8.0   Glucose, UA >=500 (A) NEGATIVE mg/dL   Hgb urine dipstick SMALL (A) NEGATIVE   Bilirubin Urine NEGATIVE NEGATIVE   Ketones, ur 5 (A) NEGATIVE mg/dL   Protein, ur 30 (A) NEGATIVE mg/dL   Nitrite NEGATIVE NEGATIVE   Leukocytes,Ua NEGATIVE NEGATIVE   RBC / HPF 0-5 0 - 5 RBC/hpf   WBC, UA 0-5 0 - 5 WBC/hpf   Bacteria, UA NONE SEEN NONE SEEN   Squamous Epithelial / HPF 0-5 0 - 5 /HPF   Mucus PRESENT   Glucose, capillary  Result Value Ref Range   Glucose-Capillary 253 (H) 70 - 99 mg/dL  Glucose, capillary  Result Value Ref Range   Glucose-Capillary 220 (H) 70 - 99 mg/dL  C-reactive protein  Result Value Ref Range   CRP 7.5 (H) <1.0 mg/dL  Ferritin  Result Value Ref Range   Ferritin 266 24 - 336 ng/mL  Comprehensive metabolic panel  Result Value Ref Range   Sodium 129 (L) 135 - 145 mmol/L   Potassium 4.2 3.5 - 5.1 mmol/L   Chloride 97 (L) 98 - 111 mmol/L   CO2 23 22 - 32 mmol/L   Glucose, Bld 210 (H) 70 - 99 mg/dL   BUN 26 (H) 8 - 23 mg/dL   Creatinine, Ser 4.09 0.61 - 1.24 mg/dL   Calcium 7.7 (L) 8.9 - 10.3 mg/dL   Total Protein 6.2 (L) 6.5 - 8.1 g/dL   Albumin 2.9 (L) 3.5 - 5.0 g/dL   AST 15 15 - 41 U/L   ALT 18 0 - 44 U/L   Alkaline Phosphatase 78 38 - 126 U/L   Total Bilirubin 0.6 0.3 - 1.2  mg/dL   GFR, Estimated >81 >19 mL/min   Anion gap 9 5 - 15  CBC  Result Value Ref Range   WBC 18.7 (H) 4.0 - 10.5 K/uL   RBC 4.35 4.22 - 5.81 MIL/uL   Hemoglobin 12.6 (L) 13.0 - 17.0 g/dL   HCT 14.7 82.9 - 56.2 %   MCV 91.0 80.0 - 100.0 fL   MCH 29.0 26.0 - 34.0 pg   MCHC 31.8 30.0 - 36.0 g/dL   RDW 13.0 (H) 86.5 - 78.4 %   Platelets 139 (L) 150 - 400 K/uL   nRBC 0.0 0.0 - 0.2 %  Glucose, capillary  Result Value Ref Range   Glucose-Capillary 272 (H) 70 - 99 mg/dL  Glucose, capillary  Result Value Ref Range   Glucose-Capillary 219 (H) 70 - 99 mg/dL   Comment 1 Notify RN    Comment 2 Document in Chart   Glucose, capillary  Result Value Ref Range   Glucose-Capillary 291 (H) 70 - 99 mg/dL   Comment 1 Notify RN    Comment 2 Document in Chart   Glucose, capillary  Result Value Ref Range   Glucose-Capillary 247 (H) 70 - 99 mg/dL  Comment 1 Notify RN    Comment 2 Document in Chart   C-reactive protein  Result Value Ref Range   CRP 3.9 (H) <1.0 mg/dL  Ferritin  Result Value Ref Range   Ferritin 218 24 - 336 ng/mL  Basic metabolic panel  Result Value Ref Range   Sodium 129 (L) 135 - 145 mmol/L   Potassium 3.9 3.5 - 5.1 mmol/L   Chloride 101 98 - 111 mmol/L   CO2 19 (L) 22 - 32 mmol/L   Glucose, Bld 264 (H) 70 - 99 mg/dL   BUN 29 (H) 8 - 23 mg/dL   Creatinine, Ser 8.11 0.61 - 1.24 mg/dL   Calcium 7.6 (L) 8.9 - 10.3 mg/dL   GFR, Estimated >91 >47 mL/min   Anion gap 9 5 - 15  Magnesium  Result Value Ref Range   Magnesium 2.0 1.7 - 2.4 mg/dL  Glucose, capillary  Result Value Ref Range   Glucose-Capillary 244 (H) 70 - 99 mg/dL   Comment 1 Notify RN    Comment 2 Document in Chart   Glucose, capillary  Result Value Ref Range   Glucose-Capillary 238 (H) 70 - 99 mg/dL       Pertinent labs & imaging results that were available during my care of the patient were reviewed by me and considered in my medical decision making.  Assessment & Plan:  George Anthony was seen today for  hospitalization follow-up and back pain.  Diagnoses and all orders for this visit:  Compression fracture of L5 vertebra, initial encounter (HCC) Will provide short course of tramadol for pain relief. Sedation and proper dosing precautions discussed in detail. Referral to spine placed. Aware to follow up with oncology next week.  -     Ambulatory referral to Spine Surgery -     traMADol (ULTRAM) 50 MG tablet; Take 1 tablet (50 mg total) by mouth every 8 (eight) hours as needed for up to 5 days.  Hospital discharge follow-up Will repeat labs today.  -     CMP14+EGFR -     CBC with Differential/Platelet  Pneumonia due to COVID-19 virus Cough in adult patient Still has dry cough, will trail below. No other associated symptoms.  -     benzonatate (TESSALON PERLES) 100 MG capsule; Take 2 capsules (200 mg total) by mouth 3 (three) times daily as needed.  Renal cell carcinoma, right (HCC) Aware to keep follow up with oncology next week.     Continue all other maintenance medications.  Follow up plan: Return if symptoms worsen or fail to improve.   Continue healthy lifestyle choices, including diet (rich in fruits, vegetables, and lean proteins, and low in salt and simple carbohydrates) and exercise (at least 30 minutes of moderate physical activity daily).    The above assessment and management plan was discussed with the patient. The patient verbalized understanding of and has agreed to the management plan. Patient is aware to call the clinic if they develop any new symptoms or if symptoms persist or worsen. Patient is aware when to return to the clinic for a follow-up visit. Patient educated on when it is appropriate to go to the emergency department.   Kari Baars, FNP-C [facility] [phone]

## 2022-10-16 NOTE — Patient Instructions (Addendum)
Discuss compression fracture with oncology. Referral to spine specialist has been placed.  Tramadol only as needed for severe pain.  Fiber supplement to avoid constipation.  Plenty of water intake.

## 2022-10-17 LAB — CBC WITH DIFFERENTIAL/PLATELET

## 2022-10-18 LAB — CBC WITH DIFFERENTIAL/PLATELET
Basos: 0 %
EOS (ABSOLUTE): 0.1 10*3/uL (ref 0.0–0.2)
Eos: 1 %
Hematocrit: 44.6 % (ref 37.5–51.0)
Hemoglobin: 14.2 g/dL (ref 13.0–17.7)
Immature Granulocytes: 0 %
Immature Granulocytes: 0 10*3/uL (ref 0.0–0.1)
Lymphs: 60 %
MCH: 29.5 pg (ref 26.6–33.0)
MCHC: 31.8 g/dL (ref 31.5–35.7)
MCV: 93 fL (ref 79–97)
Monocytes Absolute: 0.2 10*3/uL (ref 0.0–0.4)
Monocytes Absolute: 1.4 10*3/uL — ABNORMAL HIGH (ref 0.1–0.9)
Monocytes: 7 %
Neutrophils Absolute: 12.4 10*3/uL — ABNORMAL HIGH (ref 0.7–3.1)
Neutrophils Absolute: 6.5 10*3/uL (ref 1.4–7.0)
Neutrophils: 32 %
Platelets: 128 10*3/uL — ABNORMAL LOW (ref 150–450)
RBC: 4.81 x10E6/uL (ref 4.14–5.80)
RDW: 15.2 % (ref 11.6–15.4)
WBC: 20.6 10*3/uL (ref 3.4–10.8)

## 2022-10-18 LAB — CMP14+EGFR
ALT: 22 [IU]/L (ref 0–44)
AST: 15 [IU]/L (ref 0–40)
Albumin: 3.9 g/dL (ref 3.8–4.8)
Alkaline Phosphatase: 129 [IU]/L — ABNORMAL HIGH (ref 44–121)
BUN/Creatinine Ratio: 12 (ref 10–24)
BUN: 12 mg/dL (ref 8–27)
Bilirubin Total: 0.5 mg/dL (ref 0.0–1.2)
CO2: 20 mmol/L (ref 20–29)
Calcium: 7.9 mg/dL — ABNORMAL LOW (ref 8.6–10.2)
Chloride: 100 mmol/L (ref 96–106)
Creatinine, Ser: 1.03 mg/dL (ref 0.76–1.27)
Globulin, Total: 2.4 g/dL (ref 1.5–4.5)
Glucose: 130 mg/dL — ABNORMAL HIGH (ref 70–99)
Potassium: 5.2 mmol/L (ref 3.5–5.2)
Sodium: 135 mmol/L (ref 134–144)
Total Protein: 6.3 g/dL (ref 6.0–8.5)
eGFR: 78 mL/min/{1.73_m2} (ref >59)

## 2022-10-20 DIAGNOSIS — Z79899 Other long term (current) drug therapy: Secondary | ICD-10-CM | POA: Diagnosis not present

## 2022-10-20 DIAGNOSIS — C641 Malignant neoplasm of right kidney, except renal pelvis: Secondary | ICD-10-CM | POA: Diagnosis not present

## 2022-10-20 DIAGNOSIS — M546 Pain in thoracic spine: Secondary | ICD-10-CM | POA: Diagnosis not present

## 2022-10-20 DIAGNOSIS — M545 Low back pain, unspecified: Secondary | ICD-10-CM | POA: Diagnosis not present

## 2022-10-20 DIAGNOSIS — Z5112 Encounter for antineoplastic immunotherapy: Secondary | ICD-10-CM | POA: Diagnosis not present

## 2022-11-02 ENCOUNTER — Other Ambulatory Visit: Payer: Self-pay | Admitting: Family Medicine

## 2022-11-02 DIAGNOSIS — F411 Generalized anxiety disorder: Secondary | ICD-10-CM

## 2022-11-04 ENCOUNTER — Other Ambulatory Visit: Payer: Self-pay | Admitting: Family Medicine

## 2022-11-04 DIAGNOSIS — F411 Generalized anxiety disorder: Secondary | ICD-10-CM

## 2022-11-06 ENCOUNTER — Other Ambulatory Visit: Payer: Self-pay | Admitting: Family Medicine

## 2022-11-06 DIAGNOSIS — I152 Hypertension secondary to endocrine disorders: Secondary | ICD-10-CM

## 2022-11-06 DIAGNOSIS — E1169 Type 2 diabetes mellitus with other specified complication: Secondary | ICD-10-CM

## 2022-11-24 ENCOUNTER — Telehealth: Payer: Self-pay | Admitting: Family Medicine

## 2022-11-28 DIAGNOSIS — M4854XA Collapsed vertebra, not elsewhere classified, thoracic region, initial encounter for fracture: Secondary | ICD-10-CM | POA: Diagnosis not present

## 2022-11-28 DIAGNOSIS — S22088A Other fracture of T11-T12 vertebra, initial encounter for closed fracture: Secondary | ICD-10-CM | POA: Diagnosis not present

## 2022-11-28 DIAGNOSIS — G9589 Other specified diseases of spinal cord: Secondary | ICD-10-CM | POA: Diagnosis not present

## 2022-11-28 DIAGNOSIS — M51369 Other intervertebral disc degeneration, lumbar region without mention of lumbar back pain or lower extremity pain: Secondary | ICD-10-CM | POA: Diagnosis not present

## 2022-11-28 DIAGNOSIS — M438X4 Other specified deforming dorsopathies, thoracic region: Secondary | ICD-10-CM | POA: Diagnosis not present

## 2022-11-28 DIAGNOSIS — M546 Pain in thoracic spine: Secondary | ICD-10-CM | POA: Diagnosis not present

## 2022-11-28 DIAGNOSIS — M48061 Spinal stenosis, lumbar region without neurogenic claudication: Secondary | ICD-10-CM | POA: Diagnosis not present

## 2022-12-01 DIAGNOSIS — Z5112 Encounter for antineoplastic immunotherapy: Secondary | ICD-10-CM | POA: Diagnosis not present

## 2022-12-01 DIAGNOSIS — R059 Cough, unspecified: Secondary | ICD-10-CM | POA: Diagnosis not present

## 2022-12-01 DIAGNOSIS — Z79899 Other long term (current) drug therapy: Secondary | ICD-10-CM | POA: Diagnosis not present

## 2022-12-01 DIAGNOSIS — C641 Malignant neoplasm of right kidney, except renal pelvis: Secondary | ICD-10-CM | POA: Diagnosis not present

## 2022-12-01 DIAGNOSIS — R110 Nausea: Secondary | ICD-10-CM | POA: Diagnosis not present

## 2022-12-01 DIAGNOSIS — S22000A Wedge compression fracture of unspecified thoracic vertebra, initial encounter for closed fracture: Secondary | ICD-10-CM | POA: Diagnosis not present

## 2023-01-08 ENCOUNTER — Ambulatory Visit: Payer: Medicare Other | Admitting: Family Medicine

## 2023-01-08 ENCOUNTER — Encounter: Payer: Self-pay | Admitting: Family Medicine

## 2023-01-08 VITALS — BP 109/71 | HR 101 | Temp 97.0°F | Ht 67.0 in | Wt 203.6 lb

## 2023-01-08 DIAGNOSIS — I152 Hypertension secondary to endocrine disorders: Secondary | ICD-10-CM

## 2023-01-08 DIAGNOSIS — E785 Hyperlipidemia, unspecified: Secondary | ICD-10-CM

## 2023-01-08 DIAGNOSIS — E11649 Type 2 diabetes mellitus with hypoglycemia without coma: Secondary | ICD-10-CM

## 2023-01-08 DIAGNOSIS — C641 Malignant neoplasm of right kidney, except renal pelvis: Secondary | ICD-10-CM | POA: Diagnosis not present

## 2023-01-08 DIAGNOSIS — E1169 Type 2 diabetes mellitus with other specified complication: Secondary | ICD-10-CM

## 2023-01-08 DIAGNOSIS — Z794 Long term (current) use of insulin: Secondary | ICD-10-CM | POA: Diagnosis not present

## 2023-01-08 DIAGNOSIS — E1159 Type 2 diabetes mellitus with other circulatory complications: Secondary | ICD-10-CM | POA: Diagnosis not present

## 2023-01-08 LAB — BAYER DCA HB A1C WAIVED: HB A1C (BAYER DCA - WAIVED): 6 % — ABNORMAL HIGH (ref 4.8–5.6)

## 2023-01-08 MED ORDER — ATORVASTATIN CALCIUM 40 MG PO TABS
40.0000 mg | ORAL_TABLET | Freq: Every day | ORAL | 1 refills | Status: DC
Start: 2023-01-08 — End: 2023-08-17

## 2023-01-08 MED ORDER — GLIPIZIDE 5 MG PO TABS
5.0000 mg | ORAL_TABLET | Freq: Two times a day (BID) | ORAL | 3 refills | Status: DC
Start: 2023-01-08 — End: 2023-04-08

## 2023-01-08 MED ORDER — LISINOPRIL 10 MG PO TABS: 10.0000 mg | ORAL_TABLET | Freq: Every day | ORAL | 1 refills | Status: DC

## 2023-01-08 NOTE — Patient Instructions (Addendum)

## 2023-01-08 NOTE — Progress Notes (Signed)
Subjective:  Patient ID: George Anthony, male    DOB: Jan 08, 1951, 72 y.o.   MRN: [id number]  Patient Care Team: Sonny Masters, FNP as PCP - General (Family Medicine) Lanelle Bal, DO as Consulting Physician (Internal Medicine) Michaelle Copas, MD as Referring Physician (Optometry)   Chief Complaint:  Diabetes (3 month follow up )   HPI: George Anthony is a 72 y.o. male presenting on 01/08/2023 for Diabetes (3 month follow up )   Discussed the use of AI scribe software for clinical note transcription with the patient, who gave verbal consent to proceed.  History of Present Illness   The patient, with a history of kidney cancer and leukemia, presents for management of chronic medical problems. He reports a loss of approximately 15 pounds over the past six weeks, which he attributes to a decreased appetite and persistent nausea. Despite the use of Zofran, the nausea persisted, leading to a switch to Promethazine, which seems to have improved the symptoms. His oncologist also stopped his "chemo pill".  The patient's kidney cancer has metastasized, with spots identified on the lungs, liver, and the bed of the removed kidney. The patient was previously on a chemotherapy pill, which has since been discontinued due to the side effects. The patient is currently receiving infusions to prevent further growth of the cancer.  In addition to the cancer, the patient also has diabetes, which is managed with Glipizide, and hypertension, managed with Lisinopril. He reports no issues with these medications. The patient also takes Eliquis as a blood thinner and reports no abnormal bleeding. He is also on Lipitor for cholesterol management and reports no muscle aches or pains associated with this medication.  The patient is a current smoker but reports difficulty smoking due to persistent coughing. Despite the multiple health issues, the patient's diabetes appears well-controlled, with a recent  A1C of 6.0.          Relevant past medical, surgical, family, and social history reviewed and updated as indicated.  Allergies and medications reviewed and updated. Data reviewed: Chart in Epic.   Past Medical History:  Diagnosis Date   Anxiety    Diabetes mellitus without complication (HCC)    Diabetic nephropathy associated with type 2 diabetes mellitus (HCC) 02/21/2021   DVT (deep venous thrombosis) (HCC)    Enlarged heart    Hyperlipidemia    Hypertension    Renal cell carcinoma [facility])     Past Surgical History:  Procedure Laterality Date   BIOPSY  09/16/2019   Procedure: BIOPSY;  Surgeon: Lanelle Bal, DO;  Location: [facility];  Service: Endoscopy;;   COLONOSCOPY WITH PROPOFOL N/A 09/16/2019   Procedure: COLONOSCOPY WITH PROPOFOL;  Surgeon: Lanelle Bal, DO;  Location: [facility];  Service: Endoscopy;  Laterality: N/A;  7:30am   POLYPECTOMY  09/16/2019   Procedure: POLYPECTOMY;  Surgeon: Lanelle Bal, DO;  Location: [facility];  Service: Endoscopy;;    Social History   Socioeconomic History   Marital status: Married    Spouse name: charlene   Number of children: 1   Years of education: Not on file   Highest education level: Not on file  Occupational History    Employer: [employer]   Occupation: Retired  Tobacco Use   Smoking status: Every Day    Current packs/day: 1.00    Average packs/day: 1 pack/day for 40.0 years (40.0 ttl pk-yrs)    Types: Cigarettes  Smokeless tobacco: Never  Vaping Use   Vaping status: Never Used  Substance and Sexual Activity   Alcohol use: Yes    Alcohol/week: 3.0 standard drinks of alcohol    Types: 3 Cans of beer per week    Comment: drinking a couple times a month for the last year.- used to drink 2 12 packs a week   Drug use: No   Sexual activity: Yes  Other Topics Concern   Not on file  Social History Narrative   Not on file   Social Drivers of Health   Financial Resource Strain: Low Risk   (04/09/2022)   Overall Financial Resource Strain (CARDIA)    Difficulty of Paying Living Expenses: Not hard at all  Food Insecurity: No Food Insecurity (10/03/2022)   Hunger Vital Sign    Worried About Running Out of Food in the Last Year: Never true    Ran Out of Food in the Last Year: Never true  Transportation Needs: No Transportation Needs (09/27/2022)   PRAPARE - Administrator, Civil Service (Medical): No    Lack of Transportation (Non-Medical): No  Physical Activity: Inactive (04/09/2022)   Exercise Vital Sign    Days of Exercise per Week: 0 days    Minutes of Exercise per Session: 0 min  Stress: No Stress Concern Present (04/09/2022)   [employer] of Occupational Health - Occupational Stress Questionnaire    Feeling of Stress : Not at all  Social Connections: Moderately Isolated (04/09/2022)   Social Connection and Isolation Panel [NHANES]    Frequency of Communication with Friends and Family: More than three times a week    Frequency of Social Gatherings with Friends and Family: More than three times a week    Attends Religious Services: Never    Database administrator or Organizations: No    Attends Banker Meetings: Never    Marital Status: Married  Catering manager Violence: Not At Risk (09/27/2022)   Humiliation, Afraid, Rape, and Kick questionnaire    Fear of Current or Ex-Partner: No    Emotionally Abused: No    Physically Abused: No    Sexually Abused: No    Outpatient Encounter Medications as of 01/08/2023  Medication Sig   acetaminophen (TYLENOL) 500 MG tablet Take 1,300 mg by mouth every 6 (six) hours as needed for moderate pain. 650 mg tab   apixaban (ELIQUIS) 5 MG TABS tablet Take 1 tablet (5 mg total) by mouth 2 (two) times daily.   benzonatate (TESSALON PERLES) 100 MG capsule Take 2 capsules (200 mg total) by mouth 3 (three) times daily as needed.   escitalopram (LEXAPRO) 10 MG tablet Take 1 tablet by mouth once daily   glucose blood  test strip Check TID and PRN dx E11.9   INLYTA 5 MG tablet Take by mouth.   metFORMIN (GLUCOPHAGE) 1000 MG tablet Take 1 tablet (1,000 mg total) by mouth 2 (two) times daily with a meal.   [DISCONTINUED] atorvastatin (LIPITOR) 40 MG tablet TAKE 1 TABLET BY MOUTH AT BEDTIME   [DISCONTINUED] glipiZIDE (GLUCOTROL) 5 MG tablet Take 1 tablet (5 mg total) by mouth 2 (two) times daily before a meal.   [DISCONTINUED] lisinopril (ZESTRIL) 10 MG tablet Take 1 tablet by mouth once daily   atorvastatin (LIPITOR) 40 MG tablet Take 1 tablet (40 mg total) by mouth at bedtime.   glipiZIDE (GLUCOTROL) 5 MG tablet Take 1 tablet (5 mg total) by mouth 2 (two) times daily before  a meal.   lisinopril (ZESTRIL) 10 MG tablet Take 1 tablet (10 mg total) by mouth daily.   [DISCONTINUED] cefadroxil (DURICEF) 1 g tablet Take 1 tablet (1 g total) by mouth 2 (two) times daily.   No facility-administered encounter medications on file as of 01/08/2023.    No Known Allergies  Pertinent ROS per HPI, otherwise unremarkable      Objective:  BP 109/71   Pulse (!) 101   Temp (!) 97 F (36.1 C)   Ht 5\' 7"  (1.702 m)   Wt 203 lb 9.6 oz (92.4 kg)   SpO2 91%   BMI 31.89 kg/m    Wt Readings from Last 3 Encounters:  01/08/23 203 lb 9.6 oz (92.4 kg)  10/16/22 220 lb 9.6 oz (100.1 kg)  09/27/22 216 lb 4.3 oz (98.1 kg)    Physical Exam Vitals and nursing note reviewed.  Constitutional:      General: He is not in acute distress.    Appearance: Normal appearance. He is well-developed and well-groomed. He is obese. He is ill-appearing (chronically ill). He is not toxic-appearing or diaphoretic.  HENT:     Head: Normocephalic and atraumatic.     Jaw: There is normal jaw occlusion.     Right Ear: Hearing normal.     Left Ear: Hearing normal.     Nose: Nose normal.     Mouth/Throat:     Lips: Pink.     Mouth: Mucous membranes are moist.     Pharynx: Oropharynx is clear. Uvula midline.  Eyes:     General: Lids are  normal.     Extraocular Movements: Extraocular movements intact.     Conjunctiva/sclera: Conjunctivae normal.     Pupils: Pupils are equal, round, and reactive to light.  Neck:     Thyroid: No thyroid mass, thyromegaly or thyroid tenderness.     Vascular: No carotid bruit or JVD.     Trachea: Trachea and phonation normal.  Cardiovascular:     Rate and Rhythm: Normal rate and regular rhythm.     Chest Wall: PMI is not displaced.     Pulses: Normal pulses.     Heart sounds: Normal heart sounds. No murmur heard.    No friction rub. No gallop.  Pulmonary:     Effort: Pulmonary effort is normal. No respiratory distress.     Breath sounds: Examination of the right-lower field reveals wheezing. Decreased breath sounds and wheezing present.  Abdominal:     General: Abdomen is protuberant. Bowel sounds are normal. There is distension. There is no abdominal bruit.     Palpations: Abdomen is soft. There is no hepatomegaly or splenomegaly.     Tenderness: There is no abdominal tenderness. There is no right CVA tenderness or left CVA tenderness.     Hernia: No hernia is present.  Musculoskeletal:     Cervical back: Normal range of motion and neck supple.     Right lower leg: Edema present.     Left lower leg: Edema present.  Lymphadenopathy:     Cervical: No cervical adenopathy.  Skin:    General: Skin is warm and dry.     Capillary Refill: Capillary refill takes less than 2 seconds.     Coloration: Skin is not cyanotic, jaundiced or pale.     Findings: No rash.  Neurological:     General: No focal deficit present.     Mental Status: He is alert and oriented to person, place, and time.  Sensory: Sensation is intact.     Motor: Motor function is intact.     Coordination: Coordination is intact.     Gait: Gait abnormal (slow, antalgic).     Deep Tendon Reflexes: Reflexes are normal and symmetric.  Psychiatric:        Attention and Perception: Attention and perception normal.         Mood and Affect: Mood and affect normal.        Speech: Speech normal.        Behavior: Behavior normal. Behavior is cooperative.        Thought Content: Thought content normal.        Cognition and Memory: Cognition and memory normal.        Judgment: Judgment normal.       Results for orders placed or performed in visit on 10/16/22  CMP14+EGFR   Collection Time: 10/16/22 12:20 PM  Result Value Ref Range   Glucose 130 (H) 70 - 99 mg/dL   BUN 12 8 - 27 mg/dL   Creatinine, Ser 9.52 0.76 - 1.27 mg/dL   eGFR 78 >84 XL/KGM/0.10   BUN/Creatinine Ratio 12 10 - 24   Sodium 135 134 - 144 mmol/L   Potassium 5.2 3.5 - 5.2 mmol/L   Chloride 100 96 - 106 mmol/L   CO2 20 20 - 29 mmol/L   Calcium 7.9 (L) 8.6 - 10.2 mg/dL   Total Protein 6.3 6.0 - 8.5 g/dL   Albumin 3.9 3.8 - 4.8 g/dL   Globulin, Total 2.4 1.5 - 4.5 g/dL   Bilirubin Total 0.5 0.0 - 1.2 mg/dL   Alkaline Phosphatase 129 (H) 44 - 121 IU/L   AST 15 0 - 40 IU/L   ALT 22 0 - 44 IU/L  CBC with Differential/Platelet   Collection Time: 10/16/22 12:20 PM  Result Value Ref Range   WBC 20.6 (HH) 3.4 - 10.8 x10E3/uL   RBC 4.81 4.14 - 5.80 x10E6/uL   Hemoglobin 14.2 13.0 - 17.7 g/dL   Hematocrit 27.2 53.6 - 51.0 %   MCV 93 79 - 97 fL   MCH 29.5 26.6 - 33.0 pg   MCHC 31.8 31.5 - 35.7 g/dL   RDW 64.4 03.4 - 74.2 %   Platelets 128 (L) 150 - 450 x10E3/uL   Neutrophils 32 Not Estab. %   Lymphs 60 Not Estab. %   Monocytes 7 Not Estab. %   Eos 1 Not Estab. %   Basos 0 Not Estab. %   Neutrophils Absolute 6.5 1.4 - 7.0 x10E3/uL   Lymphocytes Absolute 12.4 (H) 0.7 - 3.1 x10E3/uL   Monocytes Absolute 1.4 (H) 0.1 - 0.9 x10E3/uL   EOS (ABSOLUTE) 0.2 0.0 - 0.4 x10E3/uL   Basophils Absolute 0.1 0.0 - 0.2 x10E3/uL   Immature Granulocytes 0 Not Estab. %   Immature Grans (Abs) 0.0 0.0 - 0.1 x10E3/uL       Pertinent labs & imaging results that were available during my care of the patient were reviewed by me and considered in my medical  decision making.  Assessment & Plan:  George Anthony was seen today for diabetes.  Diagnoses and all orders for this visit:  Type 2 diabetes mellitus with hypoglycemia without coma, with long-term current use of insulin (HCC) -     Bayer DCA Hb A1c Waived -     glipiZIDE (GLUCOTROL) 5 MG tablet; Take 1 tablet (5 mg total) by mouth 2 (two) times daily before a meal.  Hyperlipidemia associated with  type 2 diabetes mellitus (HCC) -     atorvastatin (LIPITOR) 40 MG tablet; Take 1 tablet (40 mg total) by mouth at bedtime.  Hypertension associated with type 2 diabetes mellitus (HCC) -     lisinopril (ZESTRIL) 10 MG tablet; Take 1 tablet (10 mg total) by mouth daily.  Renal cell carcinoma, right (HCC) Followed by oncology.  Morbid obesity (HCC) -     Bayer DCA Hb A1c Waived     Assessment and Plan    Metastatic Kidney Cancer Metastatic kidney cancer with metastases to lungs, liver, and kidney bed. Significant weight loss (15 pounds in 6 weeks) and chemotherapy-induced nausea/vomiting. Current treatment includes infusions every six weeks. Anti-nausea medication adjusted from Zofran to Phenergan (promethazine) with symptom improvement. Cancer pill temporarily stopped; dosage adjustments under consideration. Treatment aims to prevent further spread. - Continue infusions every six weeks - Monitor weight and nutritional status - Adjust chemotherapy dosage as needed - Continue Phenergan for nausea - Follow up with oncology for further management  Leukemia Concurrent leukemia diagnosis. No changes in treatment discussed. - Continue current leukemia management as per oncology recommendations  Diabetes Mellitus Type 2 Diabetes well-controlled with A1c of 6.0. Currently on glipizide with no reported issues. - Continue glipizide - Monitor blood glucose levels regularly  Hypertension Hypertension managed with lisinopril. Reports dry cough, likely related to smoking. - Continue lisinopril - Monitor  blood pressure regularly - Encourage smoking cessation  Hyperlipidemia Hyperlipidemia managed with Lipitor. No significant muscle aches or pains reported. - Continue Lipitor - Monitor lipid levels regularly  General Health Maintenance Routine health maintenance discussed. - Send in medication refills  Follow-up - Follow up at the hospital on December 20 and December 23 - Continue regular follow-ups with oncology and primary care.          Continue all other maintenance medications.  Follow up plan: Return in about 3 months (around 04/08/2023) for chronic follow up, DM.   Continue healthy lifestyle choices, including diet (rich in fruits, vegetables, and lean proteins, and low in salt and simple carbohydrates) and exercise (at least 30 minutes of moderate physical activity daily).  Educational handout given for DM  The above assessment and management plan was discussed with the patient. The patient verbalized understanding of and has agreed to the management plan. Patient is aware to call the clinic if they develop any new symptoms or if symptoms persist or worsen. Patient is aware when to return to the clinic for a follow-up visit. Patient educated on when it is appropriate to go to the emergency department.   Kari Baars, FNP-C [facility] [phone]

## 2023-01-09 DIAGNOSIS — C641 Malignant neoplasm of right kidney, except renal pelvis: Secondary | ICD-10-CM | POA: Diagnosis not present

## 2023-01-09 DIAGNOSIS — K7689 Other specified diseases of liver: Secondary | ICD-10-CM | POA: Diagnosis not present

## 2023-01-09 DIAGNOSIS — R911 Solitary pulmonary nodule: Secondary | ICD-10-CM | POA: Diagnosis not present

## 2023-01-09 DIAGNOSIS — R590 Localized enlarged lymph nodes: Secondary | ICD-10-CM | POA: Diagnosis not present

## 2023-01-09 DIAGNOSIS — Z905 Acquired absence of kidney: Secondary | ICD-10-CM | POA: Diagnosis not present

## 2023-01-12 DIAGNOSIS — Z5112 Encounter for antineoplastic immunotherapy: Secondary | ICD-10-CM | POA: Diagnosis not present

## 2023-01-12 DIAGNOSIS — Z79899 Other long term (current) drug therapy: Secondary | ICD-10-CM | POA: Diagnosis not present

## 2023-01-12 DIAGNOSIS — C641 Malignant neoplasm of right kidney, except renal pelvis: Secondary | ICD-10-CM | POA: Diagnosis not present

## 2023-01-12 DIAGNOSIS — R0602 Shortness of breath: Secondary | ICD-10-CM | POA: Diagnosis not present

## 2023-02-04 DIAGNOSIS — C641 Malignant neoplasm of right kidney, except renal pelvis: Secondary | ICD-10-CM | POA: Diagnosis not present

## 2023-02-04 DIAGNOSIS — M549 Dorsalgia, unspecified: Secondary | ICD-10-CM | POA: Diagnosis not present

## 2023-02-04 DIAGNOSIS — G8929 Other chronic pain: Secondary | ICD-10-CM | POA: Diagnosis not present

## 2023-02-04 DIAGNOSIS — S22000A Wedge compression fracture of unspecified thoracic vertebra, initial encounter for closed fracture: Secondary | ICD-10-CM | POA: Diagnosis not present

## 2023-02-04 DIAGNOSIS — S22000S Wedge compression fracture of unspecified thoracic vertebra, sequela: Secondary | ICD-10-CM | POA: Diagnosis not present

## 2023-02-05 ENCOUNTER — Other Ambulatory Visit: Payer: Self-pay | Admitting: Family Medicine

## 2023-02-05 DIAGNOSIS — F411 Generalized anxiety disorder: Secondary | ICD-10-CM

## 2023-02-09 ENCOUNTER — Telehealth: Payer: Self-pay | Admitting: Family Medicine

## 2023-02-09 NOTE — Telephone Encounter (Signed)
Pts wife states that pts insurance went up on prices this year and was told that they would have to pay $250 deductible and then $41 after that to get it filled. Wife says they cannot afford that. Requesting lower tier alternative.   Please advise.

## 2023-02-10 NOTE — Telephone Encounter (Signed)
It is his Eliquis

## 2023-02-11 NOTE — Telephone Encounter (Signed)
Attempted to contact. VM full.

## 2023-02-12 NOTE — Telephone Encounter (Signed)
Spoke to pt and he agrees to having a alternative called in. LS

## 2023-02-13 ENCOUNTER — Other Ambulatory Visit: Payer: Self-pay | Admitting: Family Medicine

## 2023-02-13 DIAGNOSIS — Z86718 Personal history of other venous thrombosis and embolism: Secondary | ICD-10-CM

## 2023-02-13 MED ORDER — RIVAROXABAN 10 MG PO TABS: 10.0000 mg | ORAL_TABLET | Freq: Every day | ORAL | 6 refills | Status: DC

## 2023-02-13 NOTE — Telephone Encounter (Signed)
Attempted to contact. VM full.

## 2023-02-17 ENCOUNTER — Telehealth: Payer: Self-pay | Admitting: Family Medicine

## 2023-02-18 ENCOUNTER — Telehealth: Payer: Self-pay

## 2023-02-18 NOTE — Telephone Encounter (Signed)
Called patient, no answer, no option to leave message

## 2023-02-18 NOTE — Telephone Encounter (Signed)
Copied from CRM [id number]. Topic: Clinical - Prescription Issue >> Feb 18, 2023  2:48 PM Geroge Baseman wrote: Reason for CRM: Patient is waiting on prescription for eloquis, but they cannot afford it. They are wanting to get a prescription for PLAVIX which is what the pharmacy suggested.

## 2023-02-19 NOTE — Telephone Encounter (Signed)
I have already responded to this request. Plavix is not the same as Eliquis. He will need an appointment to start Coumadin.

## 2023-02-23 DIAGNOSIS — C641 Malignant neoplasm of right kidney, except renal pelvis: Secondary | ICD-10-CM | POA: Diagnosis not present

## 2023-02-23 DIAGNOSIS — Z79899 Other long term (current) drug therapy: Secondary | ICD-10-CM | POA: Diagnosis not present

## 2023-02-23 DIAGNOSIS — Z5112 Encounter for antineoplastic immunotherapy: Secondary | ICD-10-CM | POA: Diagnosis not present

## 2023-02-23 DIAGNOSIS — R21 Rash and other nonspecific skin eruption: Secondary | ICD-10-CM | POA: Diagnosis not present

## 2023-03-18 IMAGING — CT CT CERVICAL SPINE W/O CM
3 of 4 series · 12 of 33 positions shown, 14 images · non-contrast
Comparison: None.

CLINICAL DATA: neck pain fall.fall 2 days ago. Patient has knot on
back of head. Denies LOC. Scan delayed, RN needed to start blood
prior to coming to CT.

EXAM:
CT CERVICAL SPINE WITHOUT CONTRAST
TECHNIQUE: Multidetector CT imaging of the cervical spine was performed without
intravenous contrast. Multiplanar CT image reconstructions were also
generated.

[Series 5: sagittal bone · sagittal · 0.27mm/px · 5 of 66 slices shown, 6 images]
[im 22/66  bone]
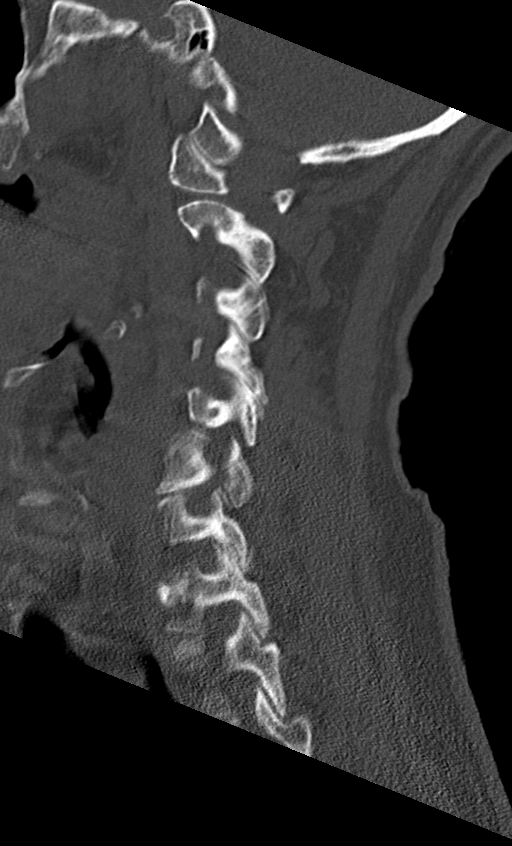
[im 28/66  bone]
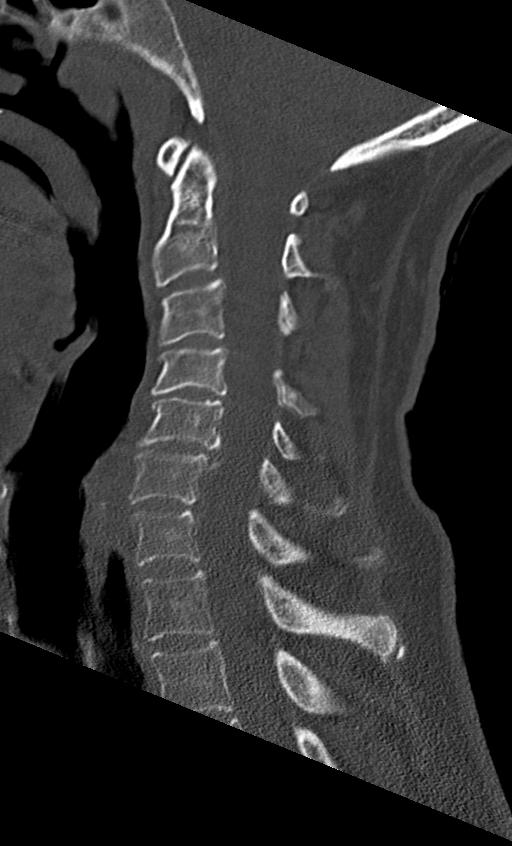
[im 33/66  soft-tissue]
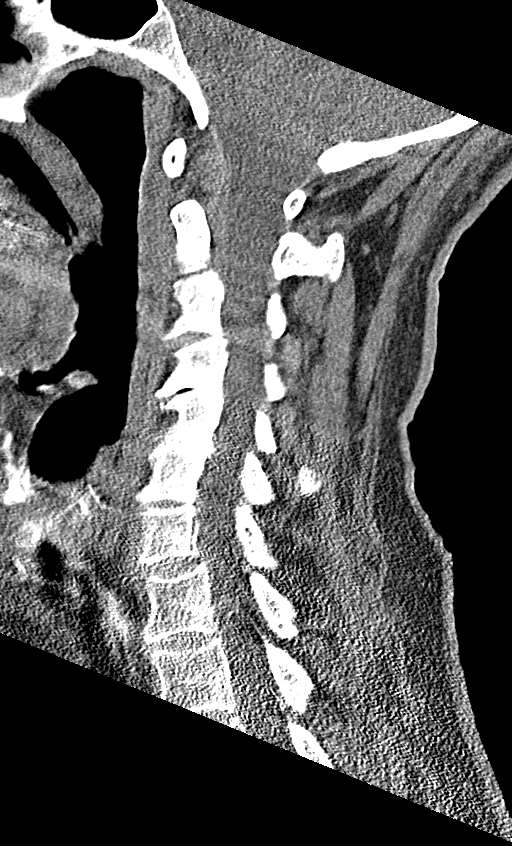
[im 33/66  bone]
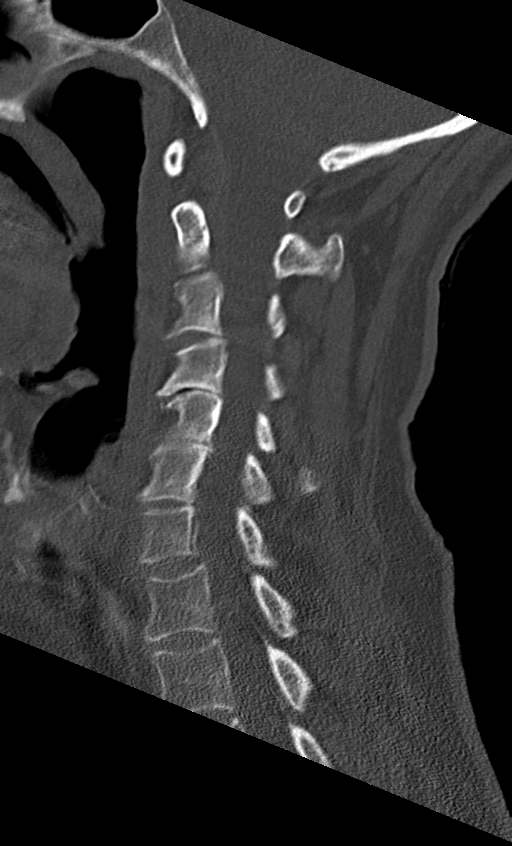
[im 38/66  bone]
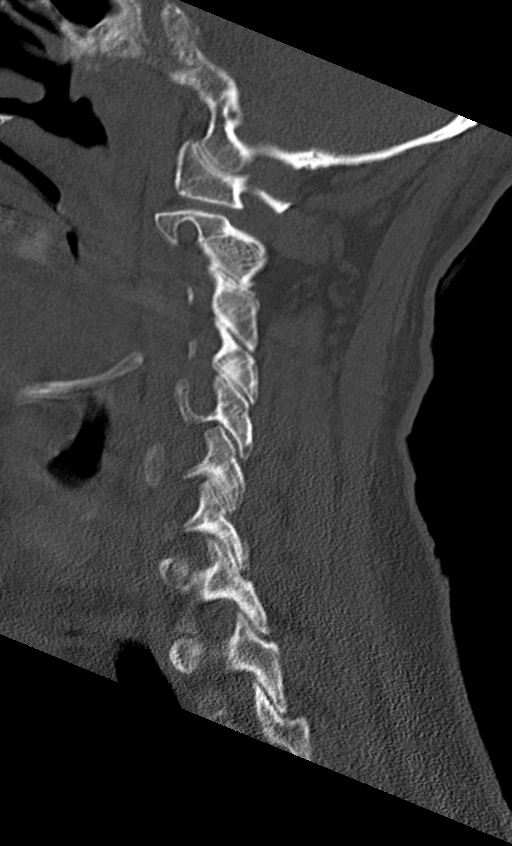
[im 44/66  bone]
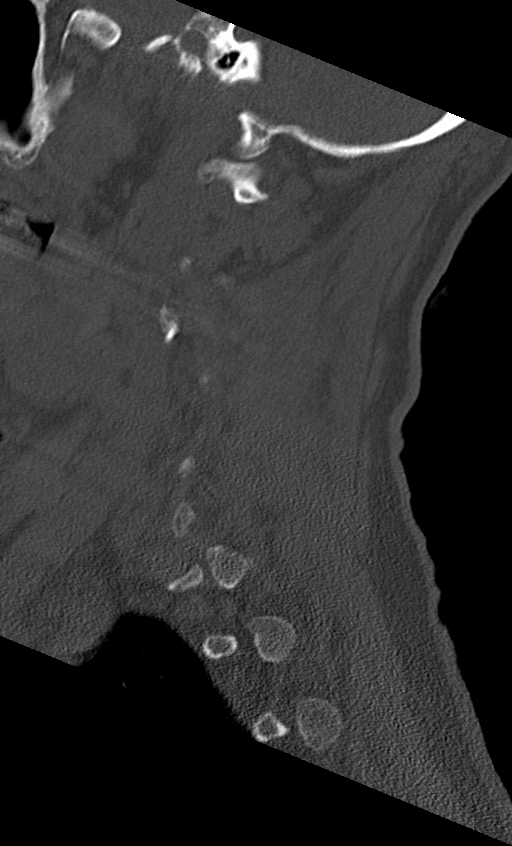

[Series 6: coronal bone · coronal · 0.26mm/px · 3 of 70 slices shown]
[im 16/70  bone]
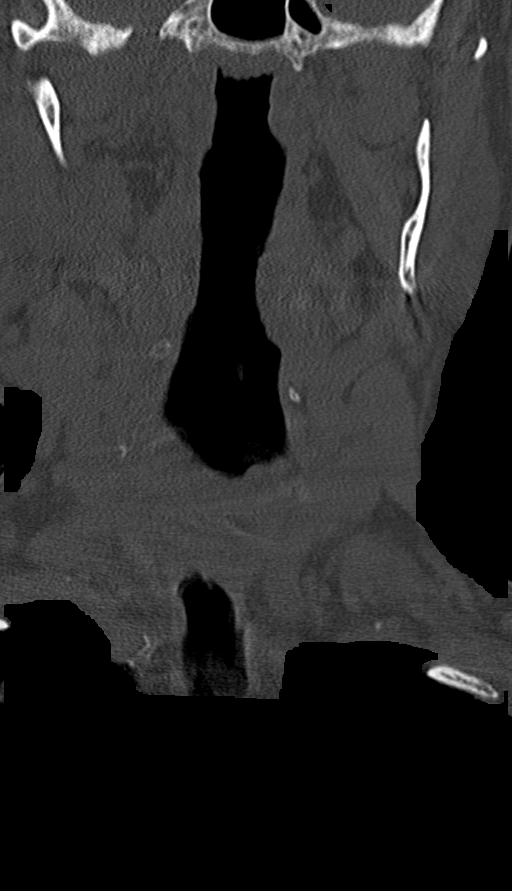
[im 29/70  bone]
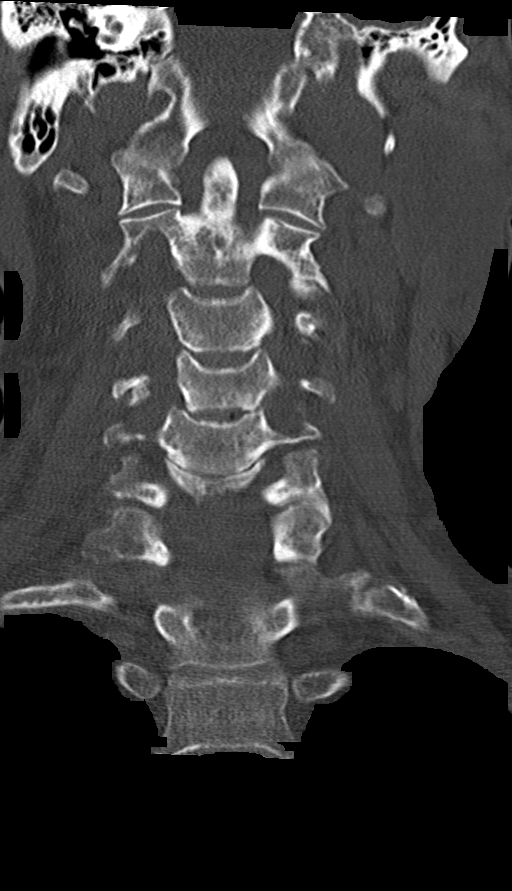
[im 42/70  bone]
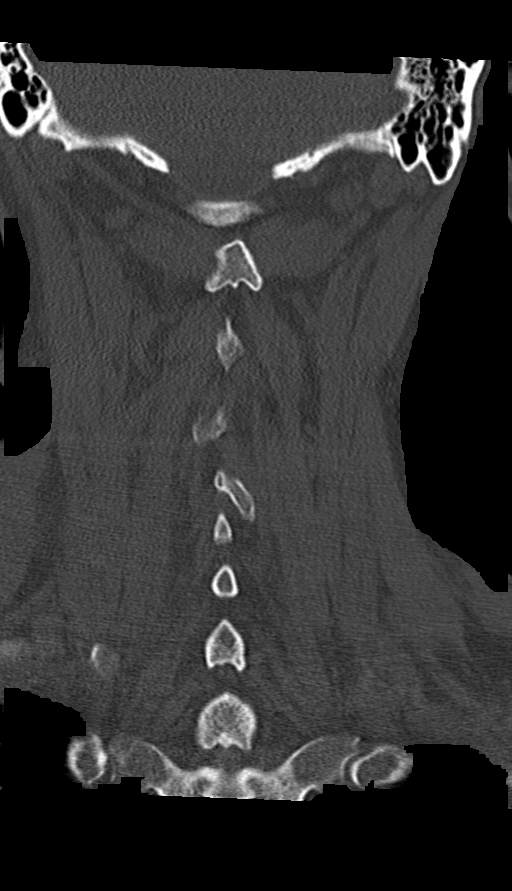

[Series 7: orthogonal axials · axial · 0.21mm/px · z∈[-137,-28]mm · 4 of 87 slices shown, 5 images]
[im 15/87  soft-tissue]
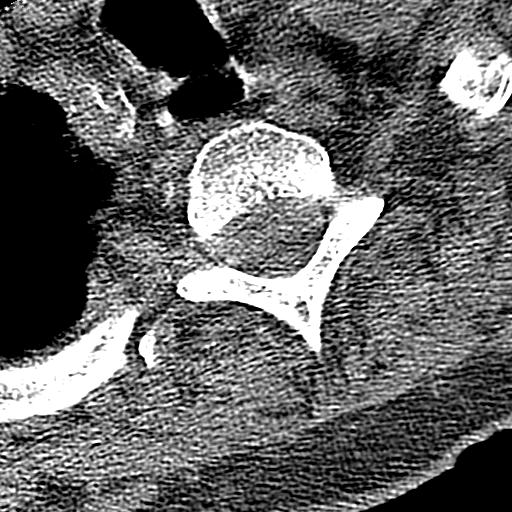
[im 15/87  bone]
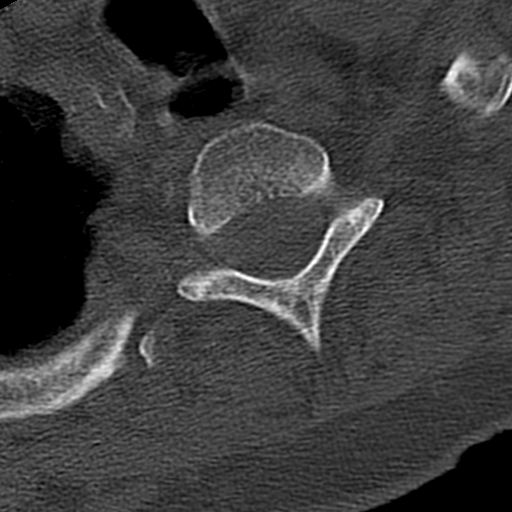
[im 29/87  bone]
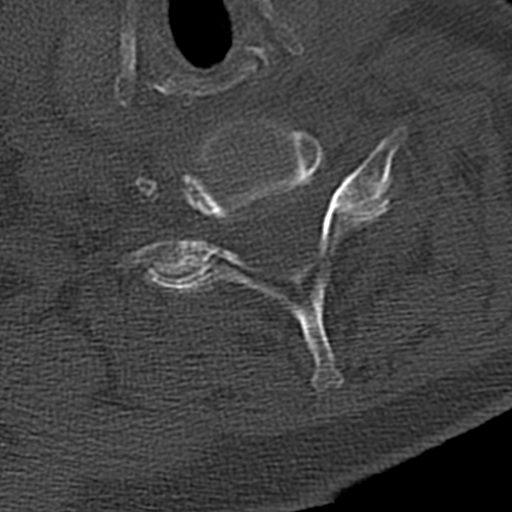
[im 58/87  bone]
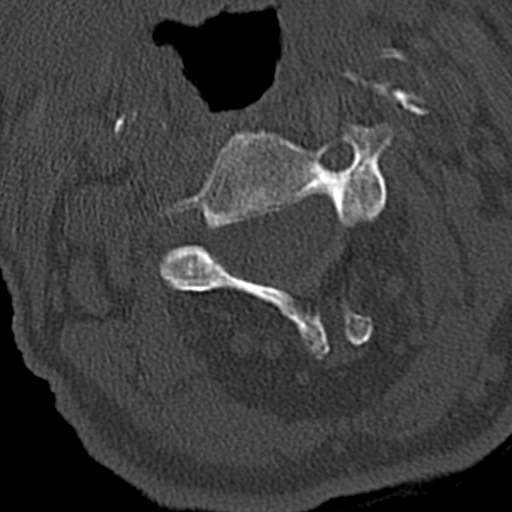
[im 72/87  bone]
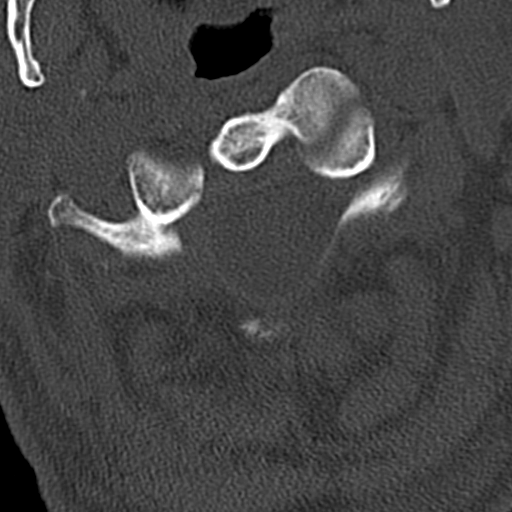

[12 of 33 positions shown; findings below may reference images not displayed]

FINDINGS: Alignment: Normal.

Skull base and vertebrae: Multilevel mild degenerative changes of
the spine. No acute fracture. No aggressive appearing focal osseous
lesion or focal pathologic process.

Soft tissues and spinal canal: No prevertebral fluid or swelling. No
visible canal hematoma.

Upper chest: Unremarkable.

Other: Left apical cystic changes.
IMPRESSION: No acute displaced fracture or traumatic listhesis of the cervical
spine.

## 2023-04-02 DIAGNOSIS — K7689 Other specified diseases of liver: Secondary | ICD-10-CM | POA: Diagnosis not present

## 2023-04-02 DIAGNOSIS — R918 Other nonspecific abnormal finding of lung field: Secondary | ICD-10-CM | POA: Diagnosis not present

## 2023-04-02 DIAGNOSIS — Z905 Acquired absence of kidney: Secondary | ICD-10-CM | POA: Diagnosis not present

## 2023-04-02 DIAGNOSIS — R911 Solitary pulmonary nodule: Secondary | ICD-10-CM | POA: Diagnosis not present

## 2023-04-02 DIAGNOSIS — C641 Malignant neoplasm of right kidney, except renal pelvis: Secondary | ICD-10-CM | POA: Diagnosis not present

## 2023-04-06 DIAGNOSIS — Z79899 Other long term (current) drug therapy: Secondary | ICD-10-CM | POA: Diagnosis not present

## 2023-04-06 DIAGNOSIS — Z5112 Encounter for antineoplastic immunotherapy: Secondary | ICD-10-CM | POA: Diagnosis not present

## 2023-04-06 DIAGNOSIS — C641 Malignant neoplasm of right kidney, except renal pelvis: Secondary | ICD-10-CM | POA: Diagnosis not present

## 2023-04-09 ENCOUNTER — Ambulatory Visit (INDEPENDENT_AMBULATORY_CARE_PROVIDER_SITE_OTHER): Payer: Medicare Other | Admitting: Family Medicine

## 2023-04-09 ENCOUNTER — Encounter: Payer: Self-pay | Admitting: Family Medicine

## 2023-04-09 VITALS — BP 143/80 | HR 86 | Temp 96.5°F | Ht 67.0 in | Wt 207.6 lb

## 2023-04-09 DIAGNOSIS — C641 Malignant neoplasm of right kidney, except renal pelvis: Secondary | ICD-10-CM | POA: Diagnosis not present

## 2023-04-09 DIAGNOSIS — E1169 Type 2 diabetes mellitus with other specified complication: Secondary | ICD-10-CM

## 2023-04-09 DIAGNOSIS — E785 Hyperlipidemia, unspecified: Secondary | ICD-10-CM | POA: Diagnosis not present

## 2023-04-09 DIAGNOSIS — E1121 Type 2 diabetes mellitus with diabetic nephropathy: Secondary | ICD-10-CM

## 2023-04-09 DIAGNOSIS — Z794 Long term (current) use of insulin: Secondary | ICD-10-CM | POA: Diagnosis not present

## 2023-04-09 DIAGNOSIS — E1159 Type 2 diabetes mellitus with other circulatory complications: Secondary | ICD-10-CM

## 2023-04-09 DIAGNOSIS — I152 Hypertension secondary to endocrine disorders: Secondary | ICD-10-CM | POA: Diagnosis not present

## 2023-04-09 DIAGNOSIS — E11649 Type 2 diabetes mellitus with hypoglycemia without coma: Secondary | ICD-10-CM | POA: Diagnosis not present

## 2023-04-09 LAB — BAYER DCA HB A1C WAIVED: HB A1C (BAYER DCA - WAIVED): 5.5 % (ref 4.8–5.6)

## 2023-04-09 MED ORDER — GLIPIZIDE 5 MG PO TABS: 5.0000 mg | ORAL_TABLET | Freq: Two times a day (BID) | ORAL | 1 refills | Status: DC

## 2023-04-09 NOTE — Patient Instructions (Signed)

## 2023-04-09 NOTE — Progress Notes (Signed)
 Subjective:  Patient ID: Vilas Edgerly, male    DOB: [birth year], 73 y.o.   MRN: [id number]  Patient Care Team: Sonny Masters, FNP as PCP - General (Family Medicine) Lanelle Bal, DO as Consulting Physician (Internal Medicine) Michaelle Copas, MD as Referring Physician (Optometry)   Chief Complaint:  Diabetes (3 month follow up )   HPI: Torrey Ballinas is a 73 y.o. male presenting on 04/09/2023 for Diabetes (3 month follow up )   Discussed the use of AI scribe software for clinical note transcription with the patient, who gave verbal consent to proceed.  History of Present Illness   Kolter Reaver is a 73 year old male with non-Hodgkin's lymphoma who presents for follow-up for management of chronic medical conditions. He is accompanied by his wife, who is his primary caregiver.  He has a history of non-Hodgkin's lymphoma with metastases to the lungs and liver. A recent scan showed two spots on his lung and a spot on his liver, with the liver lesion having shrunk. He recently had an infusion and lab work done, which was coordinated with his visit to see a healthcare provider named April.  He has a history of diabetes but does not regularly check his blood sugar levels. No increased hunger, thirst, or urination unless he does not eat. His recent A1c was 5.5, indicating good control of his diabetes. There have been no changes in his diabetes medications.  He reports stable but poor vision due to cataracts, which he describes as 'terrible'. He has not pursued treatment for this due to other ongoing health issues.          Relevant past medical, surgical, family, and social history reviewed and updated as indicated.  Allergies and medications reviewed and updated. Data reviewed: Chart in Epic.   Past Medical History:  Diagnosis Date   Anxiety    Diabetes mellitus without complication (HCC)    Diabetic nephropathy associated with type 2 diabetes mellitus (HCC)  02/21/2021   DVT (deep venous thrombosis) (HCC)    Enlarged heart    Hyperlipidemia    Hypertension    Renal cell carcinoma [facility])     Past Surgical History:  Procedure Laterality Date   BIOPSY  09/16/2019   Procedure: BIOPSY;  Surgeon: Lanelle Bal, DO;  [facility];  Service: Endoscopy;;   COLONOSCOPY WITH PROPOFOL N/A 09/16/2019   Procedure: COLONOSCOPY WITH PROPOFOL;  Surgeon: Lanelle Bal, DO;  [facility];  Service: Endoscopy;  Laterality: N/A;  7:30am   POLYPECTOMY  09/16/2019   Procedure: POLYPECTOMY;  Surgeon: Lanelle Bal, DO;  [facility];  Service: Endoscopy;;    Social History   Socioeconomic History   Marital status: Married    Spouse name: charlene   Number of children: 1   Years of education: Not on file   Highest education level: Not on file  Occupational History    Employer: [employer]   Occupation: Retired  Tobacco Use   Smoking status: Every Day    Current packs/day: 1.00    Average packs/day: 1 pack/day for 40.0 years (40.0 ttl pk-yrs)    Types: Cigarettes   Smokeless tobacco: Never  Vaping Use   Vaping status: Never Used  Substance and Sexual Activity   Alcohol use: Yes    Alcohol/week: 3.0 standard drinks of alcohol    Types: 3 Cans of beer per week    Comment: drinking  a couple times a month for the last year.- used to drink 2 12 packs a week   Drug use: No   Sexual activity: Yes  Other Topics Concern   Not on file  Social History Narrative   Not on file   Social Drivers of Health   Financial Resource Strain: Low Risk  (04/09/2022)   Overall Financial Resource Strain (CARDIA)    Difficulty of Paying Living Expenses: Not hard at all  Food Insecurity: No Food Insecurity (10/03/2022)   Hunger Vital Sign    Worried About Running Out of Food in the Last Year: Never true    Ran Out of Food in the Last Year: Never true  Transportation Needs: No Transportation Needs (09/27/2022)   PRAPARE -  Administrator, Civil Service (Medical): No    Lack of Transportation (Non-Medical): No  Physical Activity: Inactive (04/09/2022)   Exercise Vital Sign    Days of Exercise per Week: 0 days    Minutes of Exercise per Session: 0 min  Stress: No Stress Concern Present (04/09/2022)   [employer] of Occupational Health - Occupational Stress Questionnaire    Feeling of Stress : Not at all  Social Connections: Moderately Isolated (04/09/2022)   Social Connection and Isolation Panel [NHANES]    Frequency of Communication with Friends and Family: More than three times a week    Frequency of Social Gatherings with Friends and Family: More than three times a week    Attends Religious Services: Never    Database administrator or Organizations: No    Attends Banker Meetings: Never    Marital Status: Married  Catering manager Violence: Not At Risk (09/27/2022)   Humiliation, Afraid, Rape, and Kick questionnaire    Fear of Current or Ex-Partner: No    Emotionally Abused: No    Physically Abused: No    Sexually Abused: No    Outpatient Encounter Medications as of 04/09/2023  Medication Sig   acetaminophen (TYLENOL) 500 MG tablet Take 1,300 mg by mouth every 6 (six) hours as needed for moderate pain. 650 mg tab   atorvastatin (LIPITOR) 40 MG tablet Take 1 tablet (40 mg total) by mouth at bedtime.   benzonatate (TESSALON PERLES) 100 MG capsule Take 2 capsules (200 mg total) by mouth 3 (three) times daily as needed.   escitalopram (LEXAPRO) 10 MG tablet Take 1 tablet by mouth once daily   glucose blood test strip Check TID and PRN dx E11.9   INLYTA 5 MG tablet Take by mouth.   lisinopril (ZESTRIL) 10 MG tablet Take 1 tablet (10 mg total) by mouth daily.   metFORMIN (GLUCOPHAGE) 1000 MG tablet Take 1 tablet (1,000 mg total) by mouth 2 (two) times daily with a meal.   rivaroxaban (XARELTO) 10 MG TABS tablet Take 1 tablet (10 mg total) by mouth daily.   glipiZIDE (GLUCOTROL) 5  MG tablet Take 1 tablet (5 mg total) by mouth 2 (two) times daily before a meal.   [DISCONTINUED] glipiZIDE (GLUCOTROL) 5 MG tablet Take 1 tablet (5 mg total) by mouth 2 (two) times daily before a meal.   No facility-administered encounter medications on file as of 04/09/2023.    No Known Allergies  Pertinent ROS per HPI, otherwise unremarkable      Objective:  BP (!) 143/80   Pulse 86   Temp (!) 96.5 F (35.8 C)   Ht 5\' 7"  (1.702 m)   Wt 207 lb 9.6 oz (94.2  kg)   SpO2 94%   BMI 32.51 kg/m    Wt Readings from Last 3 Encounters:  04/09/23 207 lb 9.6 oz (94.2 kg)  01/08/23 203 lb 9.6 oz (92.4 kg)  10/16/22 220 lb 9.6 oz (100.1 kg)    Physical Exam Vitals and nursing note reviewed.  Constitutional:      Appearance: He is obese. He is ill-appearing (chonically ill).  HENT:     Head: Normocephalic and atraumatic.     Nose: Nose normal.     Mouth/Throat:     Mouth: Mucous membranes are moist.  Eyes:     Conjunctiva/sclera: Conjunctivae normal.     Pupils: Pupils are equal, round, and reactive to light.  Cardiovascular:     Rate and Rhythm: Normal rate and regular rhythm.     Heart sounds: Normal heart sounds.  Pulmonary:     Effort: Pulmonary effort is normal.     Breath sounds: Decreased breath sounds present.  Musculoskeletal:     Cervical back: Neck supple.     Right lower leg: Edema present.     Left lower leg: Edema present.  Skin:    General: Skin is warm and dry.     Capillary Refill: Capillary refill takes less than 2 seconds.  Neurological:     General: No focal deficit present.     Mental Status: He is alert and oriented to person, place, and time.  Psychiatric:        Mood and Affect: Mood normal.        Behavior: Behavior normal.        Thought Content: Thought content normal.        Judgment: Judgment normal.      Results for orders placed or performed in visit on 01/08/23  Bayer DCA Hb A1c Waived   Collection Time: 01/08/23  8:37 AM  Result  Value Ref Range   HB A1C (BAYER DCA - WAIVED) 6.0 (H) 4.8 - 5.6 %       Pertinent labs & imaging results that were available during my care of the patient were reviewed by me and considered in my medical decision making.  Assessment & Plan:  Diego was seen today for diabetes.  Diagnoses and all orders for this visit:  Type 2 diabetes mellitus with hypoglycemia without coma, with long-term current use of insulin (HCC) -     Bayer DCA Hb A1c Waived -     glipiZIDE (GLUCOTROL) 5 MG tablet; Take 1 tablet (5 mg total) by mouth 2 (two) times daily before a meal. -     Microalbumin / creatinine urine ratio  Diabetic nephropathy associated with type 2 diabetes mellitus (HCC) -     Bayer DCA Hb A1c Waived -     Microalbumin / creatinine urine ratio  Hyperlipidemia associated with type 2 diabetes mellitus (HCC) -     Bayer DCA Hb A1c Waived -     Microalbumin / creatinine urine ratio  Hypertension associated with type 2 diabetes mellitus (HCC) -     Bayer DCA Hb A1c Waived -     Microalbumin / creatinine urine ratio  Renal cell carcinoma, right (HCC) -     Microalbumin / creatinine urine ratio  Morbid (severe) obesity due to excess calories (HCC) -     Bayer DCA Hb A1c Waived     Assessment and Plan    Non-Hodgkin's Lymphoma Non-Hodgkin's lymphoma with metastasis to the lungs and liver. Recent imaging shows two pulmonary  lesions and a regressing hepatic lesion. He is undergoing regular infusions as part of the treatment plan. Currently receiving treatment in [place], but transportation issues may necessitate a change to a closer location such as [place]. - Continue current infusion therapy - Coordinate potential change of treatment location to [facility] if necessary  Type 2 Diabetes Mellitus Type 2 diabetes mellitus with well-controlled blood glucose levels. Recent A1c is 5.5, indicating good glycemic control. He does not regularly check blood glucose but reports no symptoms of  hyperglycemia. - Continue current diabetes management plan - Order A1c test - Obtain urine sample to check for proteinuria at next visit  Cataracts Bilateral cataracts with vision impairment. He is aware of the condition but is currently not pursuing surgical intervention due to other ongoing medical issues. - Monitor vision changes - Discuss potential for cataract surgery if condition stabilizes          Continue all other maintenance medications.  Follow up plan: Return in about 3 months (around 07/10/2023) for DM.   Continue healthy lifestyle choices, including diet (rich in fruits, vegetables, and lean proteins, and low in salt and simple carbohydrates) and exercise (at least 30 minutes of moderate physical activity daily).  Educational handout given for DM  The above assessment and management plan was discussed with the patient. The patient verbalized understanding of and has agreed to the management plan. Patient is aware to call the clinic if they develop any new symptoms or if symptoms persist or worsen. Patient is aware when to return to the clinic for a follow-up visit. Patient educated on when it is appropriate to go to the emergency department.   Kari Baars, FNP-C [facility] [phone]

## 2023-04-18 ENCOUNTER — Other Ambulatory Visit: Payer: Self-pay | Admitting: Family Medicine

## 2023-04-18 DIAGNOSIS — Z794 Long term (current) use of insulin: Secondary | ICD-10-CM

## 2023-04-23 ENCOUNTER — Other Ambulatory Visit: Payer: Self-pay | Admitting: Family Medicine

## 2023-04-23 DIAGNOSIS — Z86718 Personal history of other venous thrombosis and embolism: Secondary | ICD-10-CM

## 2023-04-27 ENCOUNTER — Telehealth: Payer: Self-pay | Admitting: Family Medicine

## 2023-04-27 ENCOUNTER — Other Ambulatory Visit: Payer: Self-pay | Admitting: Family Medicine

## 2023-04-27 DIAGNOSIS — Z86718 Personal history of other venous thrombosis and embolism: Secondary | ICD-10-CM

## 2023-04-27 MED ORDER — APIXABAN 5 MG PO TABS: 5.0000 mg | ORAL_TABLET | Freq: Two times a day (BID) | ORAL | 0 refills | Status: DC

## 2023-04-27 NOTE — Telephone Encounter (Unsigned)
 Copied from CRM [id number]. Topic: Clinical - Prescription Issue >> Apr 27, 2023 11:47 AM Melissa C wrote: Reason for CRM: patient wants to go back on the Eloquis, as the substitute prescription costs the same amount and the Eloquis was better. They would like prescription sent over to  [facility] [address] [place] [address], [place] [address] Phone: [phone]  Fax: [phone]  Thank you

## 2023-04-28 NOTE — Telephone Encounter (Signed)
Attempted to contact- mailboxfull  

## 2023-05-01 NOTE — Telephone Encounter (Signed)
 Mailbox full 4/11 LS

## 2023-05-04 NOTE — Telephone Encounter (Signed)
 Attempted to contact patient- mailbox full

## 2023-05-04 NOTE — Telephone Encounter (Signed)
 Letter mailed asking patient to contact the office about medication change

## 2023-05-14 ENCOUNTER — Other Ambulatory Visit: Payer: Self-pay | Admitting: Family Medicine

## 2023-05-14 DIAGNOSIS — F411 Generalized anxiety disorder: Secondary | ICD-10-CM

## 2023-05-18 DIAGNOSIS — C641 Malignant neoplasm of right kidney, except renal pelvis: Secondary | ICD-10-CM | POA: Diagnosis not present

## 2023-05-18 DIAGNOSIS — Z79899 Other long term (current) drug therapy: Secondary | ICD-10-CM | POA: Diagnosis not present

## 2023-05-18 DIAGNOSIS — Z5112 Encounter for antineoplastic immunotherapy: Secondary | ICD-10-CM | POA: Diagnosis not present

## 2023-05-19 ENCOUNTER — Telehealth: Payer: Self-pay | Admitting: Family Medicine

## 2023-05-19 NOTE — Telephone Encounter (Signed)
 Wife aware of medication change and verbalizes understanding.

## 2023-05-19 NOTE — Telephone Encounter (Signed)
 Copied from CRM [id number]. Topic: General - Other >> May 19, 2023  3:25 PM Star East wrote: Reason for CRM: Patient received letter about medication change- just picked up apixaban  (ELIQUIS ) 5 MG TABS tablet  recently-  Please call wife [place]  [phone]

## 2023-06-04 ENCOUNTER — Telehealth: Payer: Self-pay | Admitting: Pharmacy Technician

## 2023-06-04 ENCOUNTER — Encounter: Payer: Self-pay | Admitting: Oncology

## 2023-06-04 ENCOUNTER — Inpatient Hospital Stay: Attending: Oncology | Admitting: Oncology

## 2023-06-04 ENCOUNTER — Other Ambulatory Visit (HOSPITAL_COMMUNITY): Payer: Self-pay

## 2023-06-04 ENCOUNTER — Inpatient Hospital Stay

## 2023-06-04 VITALS — BP 130/86 | HR 97 | Temp 97.0°F | Resp 20 | Ht 63.39 in | Wt 215.2 lb

## 2023-06-04 DIAGNOSIS — C787 Secondary malignant neoplasm of liver and intrahepatic bile duct: Secondary | ICD-10-CM | POA: Diagnosis not present

## 2023-06-04 DIAGNOSIS — F17210 Nicotine dependence, cigarettes, uncomplicated: Secondary | ICD-10-CM | POA: Diagnosis not present

## 2023-06-04 DIAGNOSIS — E1121 Type 2 diabetes mellitus with diabetic nephropathy: Secondary | ICD-10-CM | POA: Insufficient documentation

## 2023-06-04 DIAGNOSIS — C9111 Chronic lymphocytic leukemia of B-cell type in remission: Secondary | ICD-10-CM | POA: Diagnosis not present

## 2023-06-04 DIAGNOSIS — C641 Malignant neoplasm of right kidney, except renal pelvis: Secondary | ICD-10-CM

## 2023-06-04 DIAGNOSIS — Z7984 Long term (current) use of oral hypoglycemic drugs: Secondary | ICD-10-CM | POA: Diagnosis not present

## 2023-06-04 DIAGNOSIS — Z720 Tobacco use: Secondary | ICD-10-CM

## 2023-06-04 DIAGNOSIS — Z86718 Personal history of other venous thrombosis and embolism: Secondary | ICD-10-CM | POA: Diagnosis not present

## 2023-06-04 DIAGNOSIS — R5383 Other fatigue: Secondary | ICD-10-CM | POA: Diagnosis not present

## 2023-06-04 DIAGNOSIS — Z905 Acquired absence of kidney: Secondary | ICD-10-CM | POA: Insufficient documentation

## 2023-06-04 DIAGNOSIS — Z79899 Other long term (current) drug therapy: Secondary | ICD-10-CM | POA: Insufficient documentation

## 2023-06-04 DIAGNOSIS — C7800 Secondary malignant neoplasm of unspecified lung: Secondary | ICD-10-CM | POA: Insufficient documentation

## 2023-06-04 DIAGNOSIS — C9110 Chronic lymphocytic leukemia of B-cell type not having achieved remission: Secondary | ICD-10-CM

## 2023-06-04 NOTE — Telephone Encounter (Signed)
 Oral Oncology Patient Advocate Encounter  Was successful in securing patient a $8,000 grant from [facility] to provide copayment coverage for Inlyta .  This will keep the out of pocket expense at $0.     [employer] ID: [id number]   The billing information is as follows and has been shared with [facility].    RxBin: [id number] PCN: [id number] Member ID: [id number] Group ID: [id number] Dates of Eligibility: 05/05/2023 through 05/03/2024  Fund:  Renal Cell Carcinoma - Medicare Access  [name], CPhT Oncology Pharmacy Patient Advocate [facility] Direct Number: [phone] Fax: [phone]

## 2023-06-04 NOTE — Progress Notes (Unsigned)
 Hematology-Oncology Clinic Note  Rakes, Georgeann Kindred, FNP   Reason for Referral: Renal cell carcinoma  Oncology History: I have reviewed his chart and materials related to his cancer extensively and collaborated history with the patient. Summary of oncologic history is as follows:  -08/14/2020: 9.1 cm renal mass with IVC thrombus to the level -09/04/2020: Surgery: Open right nephrectomy with IVC thrombectomy of bypass with history of breast, cholecystectomy - Clear-cell RCC, grade 4, invades IVC involved - Sarcomatoid features present - pT3c N0/11 R0 -01/09/2023: CT CAP: Lungs: 6 mm right apical pulmonary nodule that was not seen prior.  Additional subcentimeter pulmonary nodules.  Liver: Overall small size and distribution of low-attenuation lesions.  The lesion in the right hepatic dome and measures approximately 1.8 into 3.5 cm, no new suspicious focal findings.  Kidneys: Right nephrectomy with similar soft tissue nodules in the resection bed, with an index nodule measuring 1.4 and 1 cm.  No left hydronephrosis.  Similar exophytic left renal cyst -04/10/2022: CT CAP: Findings concerning for progressive metastatic disease including enlarging hepatic metastasis, increased nodularity within the right nephrectomy bed, enlarging pulmonary nodules, and worsening multistation lymphadenopathy.  Interval development of a wedge-shaped focus of hypoattenuation in the superior spleen, favored to represent interval infarct.  Similar 3 cm fusiform infrarenal abdominal aortic aneurysm -04/23/2022: Ultrasound liver biopsy revealed SLL/CLL on core biopsy with atypical lymphoid cells on liver FNA (CLL component is likely blood contaminant) Repeat biopsy of liver confirmed RCC -06/17/2022: Pembrolizumab  every 42 days -07/2022: Started axitinib  due to delay in access   History of Presenting Illness: George Anthony 73 y.o. male is here for ***   Medical History: Past Medical History:  Diagnosis Date    Anxiety    Diabetes mellitus without complication (HCC)    Diabetic nephropathy associated with type 2 diabetes mellitus (HCC) 02/21/2021   DVT (deep venous thrombosis) (HCC)    Enlarged heart    Hyperlipidemia    Hypertension    Renal cell carcinoma (HCC)     Surgical history: Past Surgical History:  Procedure Laterality Date   BIOPSY  09/16/2019   Procedure: BIOPSY;  Surgeon: Vinetta Greening, DO;  [facility];  Service: Endoscopy;;   COLONOSCOPY WITH PROPOFOL  N/A 09/16/2019   Procedure: COLONOSCOPY WITH PROPOFOL ;  Surgeon: Vinetta Greening, DO;  Location: [facility];  Service: Endoscopy;  Laterality: N/A;  7:30am   POLYPECTOMY  09/16/2019   Procedure: POLYPECTOMY;  Surgeon: Vinetta Greening, DO;  Location: [facility];  Service: Endoscopy;;     Allergies:  has no known allergies.  Medications:  Current Outpatient Medications  Medication Sig Dispense Refill   acetaminophen  (TYLENOL ) 500 MG tablet Take 1,300 mg by mouth every 6 (six) hours as needed for moderate pain. 650 mg tab     apixaban  (ELIQUIS ) 5 MG TABS tablet Take 1 tablet (5 mg total) by mouth 2 (two) times daily. 180 tablet 0   atorvastatin  (LIPITOR) 40 MG tablet Take 1 tablet (40 mg total) by mouth at bedtime. 90 tablet 1   benzonatate  (TESSALON  PERLES) 100 MG capsule Take 2 capsules (200 mg total) by mouth 3 (three) times daily as needed. 90 capsule 0   escitalopram  (LEXAPRO ) 10 MG tablet Take 1 tablet by mouth once daily 90 tablet 0   glipiZIDE  (GLUCOTROL ) 5 MG tablet Take 1 tablet (5 mg total) by mouth 2 (two) times daily before a meal. 180 tablet 1   glucose blood test strip Check TID and  PRN dx E11.9 100 each 11   INLYTA  5 MG tablet Take by mouth.     lisinopril  (ZESTRIL ) 10 MG tablet Take 1 tablet (10 mg total) by mouth daily. 90 tablet 1   metFORMIN  (GLUCOPHAGE ) 1000 MG tablet TAKE 1 TABLET BY MOUTH TWICE DAILY WITH A MEAL 180 tablet 0   No current facility-administered medications for this  visit.    Review of Systems: Constitutional: Denies fevers, chills or abnormal night sweats Eyes: Denies blurriness of vision, double vision or watery eyes Ears, nose, mouth, throat, and face: Denies mucositis or sore throat Respiratory: Denies cough, dyspnea or wheezes Cardiovascular: Denies palpitation, chest discomfort or lower extremity swelling Gastrointestinal:  Denies nausea, heartburn or change in bowel habits Skin: Denies abnormal skin rashes Lymphatics: Denies new lymphadenopathy or easy bruising Neurological:Denies numbness, tingling or new weaknesses Behavioral/Psych: Mood is stable, no new changes  All other systems were reviewed with the patient and are negative.  Physical Examination: ECOG PERFORMANCE STATUS: {CHL ONC ECOG [phone]}  There were no vitals filed for this visit. There were no vitals filed for this visit.  GENERAL:alert, no distress and comfortable SKIN: skin color, texture, turgor are normal, no rashes or significant lesions EYES: normal, conjunctiva are pink and non-injected, sclera clear OROPHARYNX:no exudate, no erythema and lips, buccal mucosa, and tongue normal  NECK: supple, thyroid  normal size, non-tender, without nodularity LYMPH:  no palpable lymphadenopathy in the cervical, axillary or inguinal LUNGS: clear to auscultation and percussion with normal breathing effort HEART: regular rate & rhythm and no murmurs and no lower extremity edema ABDOMEN:abdomen soft, non-tender and normal bowel sounds Musculoskeletal:no cyanosis of digits and no clubbing  PSYCH: alert & oriented x 3 with fluent speech NEURO: no focal motor/sensory deficits   Laboratory Data: I have reviewed the data as listed and labs drawn on 05/18/2023  CMP: Sodium: 137 BUN: 16, creatinine: 1.2, GFR: 64 CBC: WBC: 16.7, hemoglobin: 15.9, hematocrit: 47, MCV: 95.2, PLT: 156 Lab Results  Component Value Date   WBC 20.6 (HH) 10/16/2022   HGB 14.2 10/16/2022   HCT 44.6  10/16/2022   MCV 93 10/16/2022   PLT 128 (L) 10/16/2022   Recent Labs    09/28/22 0502 09/29/22 0519 09/30/22 0500 10/16/22 1220  NA 130* 129* 129* 135  K 4.2 4.2 3.9 5.2  CL 95* 97* 101 100  CO2 22 23 19* 20  GLUCOSE 209* 210* 264* 130*  BUN 22 26* 29* 12  CREATININE 1.06 0.95 1.02 1.03  CALCIUM  7.6* 7.7* 7.6* 7.9*  GFRNONAA >60 >60 >60  --   PROT 6.4* 6.2*  --  6.3  ALBUMIN 2.9* 2.9*  --  3.9  AST 15 15  --  15  ALT 16 18  --  22  ALKPHOS 83 78  --  129*  BILITOT 0.8 0.6  --  0.5    Radiographic Studies: I have personally reviewed the radiological images as listed and agreed with the findings in the report.  CT Angio Chest Pulmonary Embolism (PE) W or WO Contrast CLINICAL DATA:  Cough, shortness of breath, positive COVID; history of RCC; * Tracking Code: BO *  EXAM: CT ANGIOGRAPHY CHEST WITH CONTRAST  TECHNIQUE: Multidetector CT imaging of the chest was performed using the standard protocol during bolus administration of intravenous contrast. Multiplanar CT image reconstructions and MIPs were obtained to evaluate the vascular anatomy.  RADIATION DOSE REDUCTION: This exam was performed according to the departmental dose-optimization program which includes automated exposure control, adjustment  of the mA and/or kV according to patient size and/or use of iterative reconstruction technique.  CONTRAST:  75mL OMNIPAQUE  IOHEXOL  350 MG/ML SOLN  COMPARISON:  CT chest, abdomen and pelvis dated August 09, 2020  FINDINGS: Cardiovascular: No evidence of pulmonary embolus. Evaluation of the segmental and subsegmental pulmonary arteries is limited due to streak and motion artifact. Apparent filling defect of a left upper lobe segmental pulmonary artery consistent with streak artifact artery. Normal heart size. No pericardial effusion. Normal caliber thoracic aorta with moderate atherosclerotic disease.  Mediastinum/Nodes: Enlarged bilateral hilar and mediastinal  lymph nodes, new when compared with the prior. Reference right hilar lymph node measuring 14 mm in short axis series 4, image 55, previously measured 8 mm. Reference AP window lymph node measuring 12 mm in short axis on image 43, previously 5 mm.  Lungs/Pleura: Central airways are patent. Mild bilateral bronchial wall thickening. Mild peripheral predominant ground-glass opacities more confluent areas of consolidation. Trace right pleural effusion bibasilar atelectasis. Stable solid pulmonary nodule of the right upper lobe measuring 4 mm on series 6, image 57, no specific follow-up imaging is necessary given greater than 1 year stability.  Upper Abdomen: New subtle irregular lesion of the right lobe of the liver measuring 3.4 x 2.4 cm on series 5, image 242. additional new smaller lesion of the right hepatic lobe measuring 1.6 cm on series 5, image 270. Prior cholecystectomy. Mildly enlarged right para-aortic lymph node. Reference node measuring 11 mm in short axis on series 5, image 336. Appearing exophytic cyst of the posterior left kidney. Peritoneal nodule measuring 0.1 x 1.6 cm on series 5, image 301. Mildly enlarged gastrohepatic ligament lymph node measuring 1.1 cm on series 5, image 262. Similar nodular thickening of the left adrenal gland.  Musculoskeletal: Prior median sternotomy. Severe compression deformity of L5 with associated lytic lesion, slight increased height loss when compared with the prior exam.  Review of the MIP images confirms the above findings.  IMPRESSION: 1. No evidence of pulmonary embolus. Evaluation of the segmental and subsegmental pulmonary arteries is limited due to streak and motion artifact. 2. Mild peripheral predominant ground-glass opacities and more confluent areas of consolidation, consistent with history of COVID pneumonia. 3. New enlarged bilateral hilar and mediastinal lymph nodes, could be reactive, although finding is also concerning  for concerning for progressive metastatic disease given history of renal cell carcinoma. 4. New subtle irregular lesions of the right lobe of the liver, concerning for hepatic metastatic disease. 5. Mildly enlarged right para-aortic and gastrohepatic ligament lymph nodes, likely due to nodal metastatic disease. 6. New peritoneal nodule, concerning for peritoneal carcinomatosis. 7. Severe compression deformity of L5 with associated lytic lesion, slight increased height loss when compared with the prior exam, likely a pathologic fracture. 8. Aortic Atherosclerosis (ICD10-I70.0).  Electronically Signed   By: Avelino Lek M.D.   On: 09/27/2022 18:26 DG Chest Portable 1 View CLINICAL DATA:  hypoxia, covid +  EXAM: PORTABLE CHEST - 1 VIEW  COMPARISON:  06/01/2020 and previous  FINDINGS: Interval median sternotomy. Low lung volumes with patchy airspace opacities in the lung bases.  Heart size and mediastinal contours are within normal limits.  No effusion.  IMPRESSION: Low lung volumes with patchy bibasilar airspace disease.  Electronically Signed   By: Nicoletta Barrier M.D.   On: 09/27/2022 16:12    ASSESSMENT & PLAN:  No problem-specific Assessment & Plan notes found for this encounter.   No orders of the defined types were placed in this encounter.  The total time spent in the appointment was {CHL ONC TIME VISIT - [id number]:[id number]} encounter with patients including review of chart and various tests results, discussions about plan of care and coordination of care plan   All questions were answered. The patient knows to call the clinic with any problems, questions or concerns. No barriers to learning was detected.  Eduardo Grade, MD 5/15/202510:48 AM

## 2023-06-04 NOTE — Patient Instructions (Signed)
 VISIT SUMMARY:  You came in today for a follow-up visit to manage your ongoing treatment for metastatic kidney cancer. We discussed your current treatment regimen, your medication supply, and your overall health status. We also reviewed your history of chronic lymphocytic leukemia and your tobacco use.  YOUR PLAN:  -METASTATIC RENAL CELL CARCINOMA: Metastatic renal cell carcinoma is a type of kidney cancer that has spread to other parts of the body, in your case, the liver and lungs. You are currently on axitinib , 3 mg twice daily, and receiving infusions every six weeks. We will ensure you have enough medication until your next infusion on June 29, 2023. Blood work will be done before your next infusion, and a CT scan will be scheduled in June to monitor the disease progression.  -CHRONIC LYMPHOCYTIC LEUKEMIA (CLL): Chronic lymphocytic leukemia is a type of cancer that affects the blood and bone marrow. Your condition is currently stable and does not require treatment. We will continue to monitor for any symptoms or changes in your white blood cell count.  -TOBACCO USE: Continued tobacco use is a risk factor for many health issues, including kidney cancer. Quitting smoking can significantly reduce your health risks. We will provide you with resources and support to help you quit smoking.  INSTRUCTIONS:  Your next infusion is scheduled for June 29, 2023. Please ensure you have enough axitinib  until then. We will perform blood work before your next infusion and schedule a CT scan in June to monitor your disease progression. Continue to monitor for any symptoms related to chronic lymphocytic leukemia and let us  know if you experience any changes. We encourage you to take advantage of the resources and support we provide to help you quit smoking.

## 2023-06-04 NOTE — Telephone Encounter (Signed)
 Oral Oncology Patient Advocate Encounter  Dr. Orvis Blare reached out to let us  know that patient would like to transfer care to [facility].  Patient has been getting medication from [facility] and has been utilizing an in-house grant to be able to fill med.  I contacted the patient to ask how much of med he has left, his wife, Urban Garden, stated that patient has a little over 2 weeks left.  I was able to get patient a grant to help cover the cost. (Please see other encounter from me on 06/04/2023)  We will reach out to him in about two weeks to get him set up with [facility].  [name], CPhT Oncology Pharmacy Patient Advocate [facility] Direct Number: [phone] Fax: [phone]

## 2023-06-05 ENCOUNTER — Encounter: Payer: Self-pay | Admitting: Oncology

## 2023-06-05 DIAGNOSIS — C9110 Chronic lymphocytic leukemia of B-cell type not having achieved remission: Secondary | ICD-10-CM | POA: Insufficient documentation

## 2023-06-05 MED ORDER — AXITINIB 1 MG PO TABS: 3.0000 mg | ORAL_TABLET | Freq: Two times a day (BID) | ORAL | 0 refills | Status: DC

## 2023-06-05 NOTE — Assessment & Plan Note (Signed)
 Patient has metastatic renal cell carcinoma with biopsy-proven liver metastasis and lung metastasis. Oncology history as above Started on pembrolizumab  every 6 weeks and axitinib  twice daily in May 2024.  Axitinib  was started later in July 2024 due to problems with access Last imaging was not March 2025 with no evidence of progression  -Will schedule next dose of pembrolizumab  due on June 29, 2023. - Continue axitinib  3 mg twice daily.  Will reach out to oral chemo pharmacist for coordination to ensure there is no missed dosages - Will schedule CT scan at the end of June to monitor for disease progression  Return to clinic prior to next cycle of infusion

## 2023-06-05 NOTE — Progress Notes (Signed)
START ON PATHWAY REGIMEN - Renal Cell     A cycle is every 21 days:     Axitinib      Pembrolizumab   **Always confirm dose/schedule in your pharmacy ordering system**  Patient Characteristics: Stage IV (Unresected T4M0 or Any T, M1)/Metastatic Disease, Clear Cell, First Line, Favorable Risk Therapeutic Status: Stage IV (Unresected T4M0 or Any T, M1)/Metastatic Disease Histology: Clear Cell Line of Therapy: First Line Risk Status: Favorable Risk Intent of Therapy: Non-Curative / Palliative Intent, Discussed with Patient

## 2023-06-05 NOTE — Assessment & Plan Note (Signed)
 Continued tobacco use, a risk factor for kidney cancer.  Advised to quit smoking to reduce health risks.  - Provide resources and support for smoking cessation.

## 2023-06-05 NOTE — Assessment & Plan Note (Signed)
 Patient had incidental diagnosis of CLL.  RAI stage 0. Asymptomatic.  - Will continue to monitor counts - Does not meet criteria for treatment at this time

## 2023-06-06 ENCOUNTER — Other Ambulatory Visit: Payer: Self-pay

## 2023-06-09 ENCOUNTER — Other Ambulatory Visit (HOSPITAL_COMMUNITY): Payer: Self-pay

## 2023-06-09 ENCOUNTER — Telehealth: Payer: Self-pay | Admitting: Pharmacist

## 2023-06-09 DIAGNOSIS — C641 Malignant neoplasm of right kidney, except renal pelvis: Secondary | ICD-10-CM

## 2023-06-09 NOTE — Telephone Encounter (Signed)
 Clinical Pharmacist Practitioner Encounter   Received prescription for Inlyta  (axitinib ) for the treatment of metastatic renal cell carcinoma in conjunction with pembrolizumab , planned duration until disease progression or unacceptable drug toxicity. Patient is transitioning his care to [facility] and was initially started on axitinib  in July 2024.   CMP from 05/18/23 (Care Everywhere) assessed, no relevant lab abnormalities. Prescription dose and frequency assessed.   Current medication list in Epic reviewed, one DDIs with axitinib  identified: Fluconazole : fluconazole  may increase serum concentration of axitinib . Monitor for increased axitinib  toxicities. No baseline dose adjustment needed.  Evaluated chart and no patient barriers to medication adherence identified.   Prescription has been e-scribed to the [facility] for benefits analysis and approval.  Oral Oncology Clinic will continue to follow for insurance authorization, copayment issues, initial counseling and start date.   Claudie Rathbone N. Teya Otterson, PharmD, BCOP, CPP Hematology/Oncology Clinical Pharmacist [facility] Oral Chemotherapy Navigation Clinic [phone]  06/09/2023 3:01 PM

## 2023-06-10 ENCOUNTER — Telehealth: Payer: Self-pay | Admitting: *Deleted

## 2023-06-10 NOTE — Telephone Encounter (Signed)
 Patient's wife called and advised that he has developed a yeast infection underneath abdominal folds with some bleeding and skin excoriation noted.  She has been using nystatin powder with no improvement.  She also states that he has thrush.  Per Dr. Orvis Blare, patient added to schedule tomorrow for assessment.

## 2023-06-11 ENCOUNTER — Inpatient Hospital Stay: Admitting: Oncology

## 2023-06-11 VITALS — BP 132/84 | HR 92 | Temp 96.8°F | Resp 20 | Wt 215.2 lb

## 2023-06-11 DIAGNOSIS — L304 Erythema intertrigo: Secondary | ICD-10-CM | POA: Diagnosis not present

## 2023-06-11 DIAGNOSIS — C9110 Chronic lymphocytic leukemia of B-cell type not having achieved remission: Secondary | ICD-10-CM

## 2023-06-11 DIAGNOSIS — C641 Malignant neoplasm of right kidney, except renal pelvis: Secondary | ICD-10-CM | POA: Diagnosis not present

## 2023-06-11 DIAGNOSIS — R5383 Other fatigue: Secondary | ICD-10-CM | POA: Diagnosis not present

## 2023-06-11 DIAGNOSIS — Z7984 Long term (current) use of oral hypoglycemic drugs: Secondary | ICD-10-CM | POA: Diagnosis not present

## 2023-06-11 DIAGNOSIS — C787 Secondary malignant neoplasm of liver and intrahepatic bile duct: Secondary | ICD-10-CM | POA: Diagnosis not present

## 2023-06-11 DIAGNOSIS — C7800 Secondary malignant neoplasm of unspecified lung: Secondary | ICD-10-CM | POA: Diagnosis not present

## 2023-06-11 DIAGNOSIS — E1121 Type 2 diabetes mellitus with diabetic nephropathy: Secondary | ICD-10-CM | POA: Diagnosis not present

## 2023-06-11 DIAGNOSIS — Z86718 Personal history of other venous thrombosis and embolism: Secondary | ICD-10-CM | POA: Diagnosis not present

## 2023-06-11 DIAGNOSIS — Z720 Tobacco use: Secondary | ICD-10-CM

## 2023-06-11 DIAGNOSIS — Z79899 Other long term (current) drug therapy: Secondary | ICD-10-CM | POA: Diagnosis not present

## 2023-06-11 DIAGNOSIS — C9111 Chronic lymphocytic leukemia of B-cell type in remission: Secondary | ICD-10-CM | POA: Diagnosis not present

## 2023-06-11 DIAGNOSIS — Z905 Acquired absence of kidney: Secondary | ICD-10-CM | POA: Diagnosis not present

## 2023-06-11 DIAGNOSIS — F17210 Nicotine dependence, cigarettes, uncomplicated: Secondary | ICD-10-CM | POA: Diagnosis not present

## 2023-06-11 MED ORDER — FLUCONAZOLE 100 MG PO TABS: 150.0000 mg | ORAL_TABLET | ORAL | 0 refills | Status: DC

## 2023-06-11 MED ORDER — AXITINIB 1 MG PO TABS
3.0000 mg | ORAL_TABLET | Freq: Two times a day (BID) | ORAL | 2 refills | Status: DC
  Filled 2023-06-16: qty 180, 30d supply, fill #0

## 2023-06-11 MED ORDER — TERBINAFINE HCL 1 % EX CREA: 1.0000 | TOPICAL_CREAM | Freq: Two times a day (BID) | CUTANEOUS | 2 refills | Status: DC

## 2023-06-11 NOTE — Assessment & Plan Note (Signed)
 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 - Prescribed fluconazole  150 mg once a week x 4 doses -Advised to take ibuprofen or Tylenol  for pain as needed - If patient develops any fever, advised to go to the ER  Return to clinic in 10 to 14 days to reassess.  Will ask my nurse to give a call next week to check on the rash.

## 2023-06-11 NOTE — Assessment & Plan Note (Signed)
 Continued tobacco use, a risk factor for kidney cancer.  Advised to quit smoking to reduce health risks.  - Provide resources and support for smoking cessation.

## 2023-06-11 NOTE — Patient Instructions (Signed)
 VISIT SUMMARY:  You came in today because of a severe burning rash on your torso and discomfort in your mouth. The rash has been bleeding and causing significant pain, and you have also noticed a new burning sensation in your genital area. You have been experiencing nausea and altered taste, making it difficult to eat certain foods. You have been using some treatments but have not yet tried prescribed antifungal creams or oral medications for this episode.  YOUR PLAN:  -INTERTRIGO: Intertrigo is a rash that occurs in skin folds, often due to moisture and friction, and can be worsened by fungal infections. You will start using a topical antifungal cream twice daily for inflammation. Clean the affected area with soap and water , pat it dry, apply the cream, and then use powder to keep it dry. You will also take an oral antifungal medication once a week. For pain and burning, you can use ibuprofen or acetaminophen . If there is no improvement by Monday, please call us . If it still doesn't improve, we may consider hospital admission for antibiotics.   INSTRUCTIONS:  Please follow the treatment plan as discussed. If there is no improvement in your rash by Monday, call us  immediately. If symptoms worsen, we may need to consider hospital admission for further treatment.

## 2023-06-11 NOTE — Progress Notes (Signed)
 Patient Care Team: Galvin Jules, FNP as PCP - General (Family Medicine) Vinetta Greening, DO as Consulting Physician (Internal Medicine) [name], MD as Referring Physician (Optometry) Greissinger, April Tanis Fan, [place] (Hematology and Oncology)  Clinic Day:  06/11/2023  Referring physician: Galvin Jules, FNP   CHIEF COMPLAINT:  CC: Renal cell carcinoma    ASSESSMENT & PLAN:   Assessment & Plan: George Anthony  is a 73 y.o. male with metastatic right-sided renal cell carcinoma on pembrolizumab  and axitinib     Renal cell carcinoma of right kidney [facility]) Patient has metastatic renal cell carcinoma with biopsy-proven liver metastasis and lung metastasis. Oncology history as above Started on pembrolizumab  every 6 weeks and axitinib  twice daily in May 2024.  Axitinib  was started later in July 2024 due to problems with access Last imaging was not March 2025 with no evidence of progression  -Will schedule next dose of pembrolizumab  due on June 29, 2023. - Hold axitinib  for now for concerns of malignant intertrigo - Will schedule CT scan at the end of June to monitor for disease progression  Return to clinic prior to next cycle of infusion  CLL (chronic lymphocytic leukemia) (HCC) Patient had incidental diagnosis of CLL.  RAI stage 0. Asymptomatic.  - Will continue to monitor counts - Does not meet criteria for treatment at this time  Tobacco use Continued tobacco use, a risk factor for kidney cancer.  Advised to quit smoking to reduce health risks.  - Provide resources and support for smoking cessation.  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 - Prescribed fluconazole  150 mg once a week x 4 doses -Advised to take ibuprofen or  Tylenol  for pain as needed - If patient develops any fever, advised to go to the ER  Return to clinic in 10 to 14 days to reassess.  Will ask my nurse to give a call next week to check on the rash.    The patient understands the plans discussed today and is in agreement with them.  He knows to contact our office if he develops concerns prior to his next appointment.  I provided 40 minutes of face-to-face time during this encounter and > 50% was spent counseling as documented under my assessment and plan.    Eduardo Grade, MD  [facility] [place] [facility] [place] - A DEPT OF [facility] [address]  [place] [address] Dept: [phone] Dept Fax: [phone]   No orders of the defined types were placed in this encounter.    ONCOLOGY HISTORY:   Oncology History  Renal cell carcinoma of right kidney (HCC)  08/23/2020 Initial Diagnosis   Renal cell carcinoma, right (HCC)   05/26/2022 Cancer Staging   Staging form: Kidney, AJCC 8th Edition - Clinical stage from 05/26/2022: Stage IV (cM1) - Signed by Eduardo Grade, MD on 06/05/2023 Stage prefix: Recurrence   07/03/2023 -  Chemotherapy   Patient is on Treatment Plan : RENAL CELL Pembrolizumab  (200) + Axitinib  q21d     CLL (chronic lymphocytic leukemia) (HCC)  04/21/2022 Cancer Staging   Staging form: Chronic Lymphocytic Leukemia / Small Lymphocytic Lymphoma, AJCC 8th Edition - Clinical stage from 04/21/2022: Modified Rai Stage 0 (Modified Rai risk: Low,  Lymphocytosis: Present, Adenopathy: Unknown, Organomegaly: Absent, Anemia: Absent, Thrombocytopenia: Absent) - Signed by Eduardo Grade, MD on 06/05/2023 Stage prefix: Initial diagnosis Hemoglobin (Hgb) (g/dL): 16 Platelet count (X52W4/X of blood): 156   06/05/2023 Initial Diagnosis   CLL (chronic lymphocytic leukemia) (HCC)       Current Treatment: Pembrolizumab  plus axitinib   INTERVAL HISTORY:  George Anthony is here today for  severe intertrigo. Patient is accompanied by his wife today.  Patient reports severe burning rash at the skin folds below abdomen extending up to his groin.  The symptoms started several weeks ago but for the past few days has been very severe associated with bleeding.  Patient's wife reported trying nystatin powder with no improvement.  Patient denies fever, chills at this time.  He has no other complaints.  He has been taking axitinib  as prescribed.   I have reviewed the past medical history, past surgical history, social history and family history with the patient and they are unchanged from previous note.  ALLERGIES:  has no known allergies.  MEDICATIONS:  Current Outpatient Medications  Medication Sig Dispense Refill   acetaminophen  (TYLENOL ) 500 MG tablet Take 1,300 mg by mouth every 6 (six) hours as needed for moderate pain. 650 mg tab     apixaban  (ELIQUIS ) 5 MG TABS tablet Take 1 tablet (5 mg total) by mouth 2 (two) times daily. 180 tablet 0   atorvastatin  (LIPITOR) 40 MG tablet Take 1 tablet (40 mg total) by mouth at bedtime. 90 tablet 1   benzonatate  (TESSALON  PERLES) 100 MG capsule Take 2 capsules (200 mg total) by mouth 3 (three) times daily as needed. 90 capsule 0   escitalopram  (LEXAPRO ) 10 MG tablet Take 1 tablet by mouth once daily 90 tablet 0   fluconazole  (DIFLUCAN ) 100 MG tablet Take 1.5 tablets (150 mg total) by mouth once a week. 6 tablet 0   glipiZIDE  (GLUCOTROL ) 5 MG tablet Take 1 tablet (5 mg total) by mouth 2 (two) times daily before a meal. 180 tablet 1   glucose blood test strip Check TID and PRN dx E11.9 100 each 11   lisinopril  (ZESTRIL ) 10 MG tablet Take 1 tablet (10 mg total) by mouth daily. 90 tablet 1   metFORMIN  (GLUCOPHAGE ) 1000 MG tablet TAKE 1 TABLET BY MOUTH TWICE DAILY WITH A MEAL 180 tablet 0   terbinafine  (LAMISIL ) 1 % cream Apply 1 Application topically 2 (two) times daily. 30 g 2   axitinib  (INLYTA ) 1 MG tablet Take 3 tablets (3 mg total) by mouth 2  (two) times daily. 180 tablet 2   No current facility-administered medications for this visit.     REVIEW OF SYSTEMS:   Constitutional: Denies fevers, chills or abnormal weight loss Eyes: Denies blurriness of vision Ears, nose, mouth, throat, and face: Denies mucositis or sore throat Respiratory: Denies cough, dyspnea or wheezes Cardiovascular: Denies palpitation, chest discomfort or lower extremity swelling Gastrointestinal:  Denies nausea, heartburn or change in bowel habits Skin: Denies abnormal skin rashes Lymphatics: Denies new lymphadenopathy or easy bruising Neurological:Denies numbness, tingling or new weaknesses Behavioral/Psych: Mood is stable, no new changes  All other systems were reviewed with the patient and are negative.   VITALS:   Blood pressure 132/84, pulse 92, temperature (!) 96.8 F (36 C), temperature source Tympanic, resp. rate 20, weight 215 lb 3.2 oz (97.6 kg), SpO2 93%.  Wt Readings from Last 3 Encounters:  06/11/23 215 lb 3.2 oz (97.6 kg)  06/04/23 215 lb 2.7 oz (97.6 kg)  04/09/23 207 lb 9.6 oz (94.2 kg)    Body mass index is 37.66 kg/m.  Performance status (ECOG): 1 - Symptomatic but completely ambulatory  PHYSICAL EXAM:   GENERAL:alert, no distress and comfortable SKIN: Diffuse rash of the skin folds of abdomen extending up to left side groin and slightly over the testicles OROPHARYNX:no exudate, no erythema and lips, buccal mucosa, and tongue normal  LUNGS: clear to auscultation and percussion with normal breathing effort HEART: regular rate & rhythm and no murmurs and no lower extremity edema ABDOMEN:abdomen soft, non-tender and normal bowel sounds Musculoskeletal:no cyanosis of digits and no clubbing  NEURO: alert & oriented x 3 with fluent speech      LABORATORY DATA:  I have reviewed the data as listed    Component Value Date/Time   NA 135 10/16/2022 1220   K 5.2 10/16/2022 1220   CL 100 10/16/2022 1220   CO2 20 10/16/2022  1220   GLUCOSE 130 (H) 10/16/2022 1220   GLUCOSE 264 (H) 09/30/2022 0500   BUN 12 10/16/2022 1220   CREATININE 1.03 10/16/2022 1220   CALCIUM  7.9 (L) 10/16/2022 1220   PROT 6.3 10/16/2022 1220   ALBUMIN 3.9 10/16/2022 1220   AST 15 10/16/2022 1220   ALT 22 10/16/2022 1220   ALKPHOS 129 (H) 10/16/2022 1220   BILITOT 0.5 10/16/2022 1220   GFRNONAA >60 09/30/2022 0500   GFRAA 123 08/17/2019 1636    Lab Results  Component Value Date   WBC 20.6 (HH) 10/16/2022   NEUTROABS 6.5 10/16/2022   HGB 14.2 10/16/2022   HCT 44.6 10/16/2022   MCV 93 10/16/2022   PLT 128 (L) 10/16/2022      Chemistry      Component Value Date/Time   NA 135 10/16/2022 1220   K 5.2 10/16/2022 1220   CL 100 10/16/2022 1220   CO2 20 10/16/2022 1220   BUN 12 10/16/2022 1220   CREATININE 1.03 10/16/2022 1220      Component Value Date/Time   CALCIUM  7.9 (L) 10/16/2022 1220   ALKPHOS 129 (H) 10/16/2022 1220   AST 15 10/16/2022 1220   ALT 22 10/16/2022 1220   BILITOT 0.5 10/16/2022 1220       RADIOGRAPHIC STUDIES: I have personally reviewed the radiological images as listed and agreed with the findings in the report. No results found.

## 2023-06-11 NOTE — Assessment & Plan Note (Signed)
 Patient had incidental diagnosis of CLL.  RAI stage 0. Asymptomatic.  - Will continue to monitor counts - Does not meet criteria for treatment at this time

## 2023-06-11 NOTE — Assessment & Plan Note (Signed)
 Patient has metastatic renal cell carcinoma with biopsy-proven liver metastasis and lung metastasis. Oncology history as above Started on pembrolizumab  every 6 weeks and axitinib  twice daily in May 2024.  Axitinib  was started later in July 2024 due to problems with access Last imaging was not March 2025 with no evidence of progression  -Will schedule next dose of pembrolizumab  due on June 29, 2023. - Hold axitinib  for now for concerns of malignant intertrigo - Will schedule CT scan at the end of June to monitor for disease progression  Return to clinic prior to next cycle of infusion

## 2023-06-16 ENCOUNTER — Telehealth: Payer: Self-pay | Admitting: *Deleted

## 2023-06-16 ENCOUNTER — Other Ambulatory Visit: Payer: Self-pay | Admitting: Pharmacy Technician

## 2023-06-16 ENCOUNTER — Other Ambulatory Visit (HOSPITAL_COMMUNITY): Payer: Self-pay

## 2023-06-16 ENCOUNTER — Other Ambulatory Visit: Payer: Self-pay

## 2023-06-16 NOTE — Telephone Encounter (Signed)
 Called to follow up on patient's visit 5/22.  Patient states that rash is doing much better and appears to be improving daily.  Will follow up as scheduled or call if he develops new symptoms.  Verbalized understanding.

## 2023-06-16 NOTE — Progress Notes (Signed)
 Specialty Pharmacy Initiation Note   George Anthony is a 73 y.o. male who will be followed by the specialty pharmacy service for RxSp Oncology    Review of administration, indication, effectiveness, safety, potential side effects, storage/disposable, and missed dose instructions occurred today for patient's specialty medication(s) Axitinib  (INLYTA )     Patient/Caregiver did not have any additional questions or concerns.   Patient's therapy is appropriate to: Initiate    Goals Addressed             This Visit's Progress    Slow Disease Progression       Patient is initiating therapy. Patient will maintain adherence         George Anthony N Armie Moren Specialty Pharmacist

## 2023-06-16 NOTE — Progress Notes (Signed)
 Specialty Pharmacy Initial Fill Coordination Note  George Anthony is a 73 y.o. male contacted today regarding refills of specialty medication(s) Axitinib  (INLYTA ) .  Patient requested Delivery  on 06/22/23  to verified address [address] [place] [address]   Medication will be filled on 05/30.   Patient is aware of $0 copayment.   [name], CPhT Oncology Pharmacy [facility] Direct Number: [phone] Fax: [phone]

## 2023-06-19 ENCOUNTER — Other Ambulatory Visit: Payer: Self-pay

## 2023-06-22 ENCOUNTER — Other Ambulatory Visit (HOSPITAL_COMMUNITY): Payer: Self-pay

## 2023-06-22 ENCOUNTER — Inpatient Hospital Stay: Attending: Oncology | Admitting: Oncology

## 2023-06-22 VITALS — BP 132/68 | HR 79 | Temp 97.8°F | Resp 20 | Wt 222.8 lb

## 2023-06-22 DIAGNOSIS — Z905 Acquired absence of kidney: Secondary | ICD-10-CM | POA: Insufficient documentation

## 2023-06-22 DIAGNOSIS — E860 Dehydration: Secondary | ICD-10-CM | POA: Diagnosis not present

## 2023-06-22 DIAGNOSIS — Z79899 Other long term (current) drug therapy: Secondary | ICD-10-CM | POA: Insufficient documentation

## 2023-06-22 DIAGNOSIS — C787 Secondary malignant neoplasm of liver and intrahepatic bile duct: Secondary | ICD-10-CM | POA: Diagnosis not present

## 2023-06-22 DIAGNOSIS — C9111 Chronic lymphocytic leukemia of B-cell type in remission: Secondary | ICD-10-CM | POA: Diagnosis not present

## 2023-06-22 DIAGNOSIS — E1121 Type 2 diabetes mellitus with diabetic nephropathy: Secondary | ICD-10-CM | POA: Insufficient documentation

## 2023-06-22 DIAGNOSIS — Z86718 Personal history of other venous thrombosis and embolism: Secondary | ICD-10-CM | POA: Diagnosis not present

## 2023-06-22 DIAGNOSIS — Z5112 Encounter for antineoplastic immunotherapy: Secondary | ICD-10-CM | POA: Diagnosis not present

## 2023-06-22 DIAGNOSIS — C9110 Chronic lymphocytic leukemia of B-cell type not having achieved remission: Secondary | ICD-10-CM | POA: Diagnosis not present

## 2023-06-22 DIAGNOSIS — L03115 Cellulitis of right lower limb: Secondary | ICD-10-CM | POA: Insufficient documentation

## 2023-06-22 DIAGNOSIS — C641 Malignant neoplasm of right kidney, except renal pelvis: Secondary | ICD-10-CM | POA: Diagnosis not present

## 2023-06-22 DIAGNOSIS — C7800 Secondary malignant neoplasm of unspecified lung: Secondary | ICD-10-CM | POA: Insufficient documentation

## 2023-06-22 DIAGNOSIS — F17210 Nicotine dependence, cigarettes, uncomplicated: Secondary | ICD-10-CM | POA: Diagnosis not present

## 2023-06-22 DIAGNOSIS — Z79890 Hormone replacement therapy (postmenopausal): Secondary | ICD-10-CM | POA: Diagnosis not present

## 2023-06-22 DIAGNOSIS — L03116 Cellulitis of left lower limb: Secondary | ICD-10-CM | POA: Insufficient documentation

## 2023-06-22 DIAGNOSIS — E039 Hypothyroidism, unspecified: Secondary | ICD-10-CM | POA: Diagnosis not present

## 2023-06-22 DIAGNOSIS — Z720 Tobacco use: Secondary | ICD-10-CM | POA: Diagnosis not present

## 2023-06-22 DIAGNOSIS — L304 Erythema intertrigo: Secondary | ICD-10-CM | POA: Insufficient documentation

## 2023-06-22 DIAGNOSIS — E11649 Type 2 diabetes mellitus with hypoglycemia without coma: Secondary | ICD-10-CM | POA: Insufficient documentation

## 2023-06-22 DIAGNOSIS — N179 Acute kidney failure, unspecified: Secondary | ICD-10-CM | POA: Insufficient documentation

## 2023-06-22 DIAGNOSIS — Z7984 Long term (current) use of oral hypoglycemic drugs: Secondary | ICD-10-CM | POA: Insufficient documentation

## 2023-06-22 NOTE — Assessment & Plan Note (Signed)
 Patient has metastatic renal cell carcinoma with biopsy-proven liver metastasis and lung metastasis. Oncology history as below Started on pembrolizumab  every 6 weeks and axitinib  twice daily in May 2024.  Axitinib  was started later in July 2024 due to problems with access Last imaging was in March 2025 with no evidence of progression  - Continue to hold axitinib  for now for concerns of malignant intertrigo.  Improved with holding medication and treatment for intertrigo - Continue pembrolizumab  for now.  Will add lenvatinib at next visit if intertrigo has improved significantly. - Will schedule CT scan at the end of June to monitor for disease progression  Return to clinic prior to next cycle of infusion

## 2023-06-22 NOTE — Assessment & Plan Note (Signed)
 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  Improved from last visit  - Continue to hold axitinib  at this time - Continue to use terbinafine  cream twice daily in the affected area - Instructed to clean affected area with soap and water , pat dry, apply cream, and then apply powder for drying. - Continue fluconazole  150 mg once a week x 4 doses - Advised to take ibuprofen or Tylenol  for pain as needed - If patient develops any fever, advised to go to the ER  Return to clinic prior to next infusion

## 2023-06-22 NOTE — Progress Notes (Signed)
 Patient Care Team: Galvin Jules, FNP as PCP - General (Family Medicine) Vinetta Greening, DO as Consulting Physician (Internal Medicine) [name], MD as Referring Physician (Optometry) Greissinger, April Tanis Fan, [place] (Hematology and Oncology)  Clinic Day:  06/22/2023  Referring physician: Galvin Jules, FNP   CHIEF COMPLAINT:  CC: Renal cell carcinoma    ASSESSMENT & PLAN:   Assessment & Plan: George Anthony  is a 73 y.o. male with metastatic right-sided renal cell carcinoma on pembrolizumab  and axitinib     Renal cell carcinoma of right kidney [facility]) Patient has metastatic renal cell carcinoma with biopsy-proven liver metastasis and lung metastasis. Oncology history as below Started on pembrolizumab  every 6 weeks and axitinib  twice daily in May 2024.  Axitinib  was started later in July 2024 due to problems with access Last imaging was in March 2025 with no evidence of progression  - Continue to hold axitinib  for now for concerns of malignant intertrigo.  Improved with holding medication and treatment for intertrigo - Continue pembrolizumab  for now.  Will add lenvatinib at next visit if intertrigo has improved significantly. - Will schedule CT scan at the end of June to monitor for disease progression  Return to clinic prior to next cycle of infusion  CLL (chronic lymphocytic leukemia) (HCC) Patient had incidental diagnosis of CLL.  RAI stage 0. Asymptomatic.  - Will continue to monitor counts - Does not meet criteria for treatment at this time  Intertrigo Patient likely has malignant intertrigo secondary to axitinib  use.  Improved from last visit  - Continue to hold axitinib  at this time - Continue to use terbinafine  cream twice daily in the affected area - Instructed to clean affected area with soap and water , pat dry, apply cream, and then apply powder for drying. - Continue fluconazole  150 mg once a week x 4 doses - Advised to take ibuprofen or Tylenol  for  pain as needed - If patient develops any fever, advised to go to the ER  Return to clinic prior to next infusion  Tobacco use Continued tobacco use, a risk factor for kidney cancer.  Advised to quit smoking to reduce health risks.  - Provide resources and support for smoking cessation.     The patient understands the plans discussed today and is in agreement with them.  He knows to contact our office if he develops concerns prior to his next appointment.  I provided 30 minutes of face-to-face time during this encounter and > 50% was spent counseling as documented under my assessment and plan.    Eduardo Grade, MD  [place] [facility] [place] - A DEPT OF [facility] [address] [place] [address] Dept: [phone] Dept Fax: [phone]   No orders of the defined types were placed in this encounter.    ONCOLOGY HISTORY:   Oncology History  Renal cell carcinoma of right kidney (HCC)  08/23/2020 Initial Diagnosis   Renal cell carcinoma, right (HCC)   05/26/2022 Cancer Staging   Staging form: Kidney, AJCC 8th Edition - Clinical stage from 05/26/2022: Stage IV (cM1) - Signed by Eduardo Grade, MD on 06/05/2023 Stage prefix: Recurrence   07/03/2023 -  Chemotherapy   Patient is on Treatment Plan : RENAL CELL Pembrolizumab  (200) + Axitinib  q21d     CLL (chronic lymphocytic leukemia) (HCC)  04/21/2022 Cancer Staging   Staging form: Chronic Lymphocytic Leukemia / Small Lymphocytic Lymphoma, AJCC 8th Edition - Clinical stage from  04/21/2022: Modified Rai Stage 0 (Modified Rai risk: Low, Lymphocytosis: Present, Adenopathy: Unknown, Organomegaly: Absent, Anemia: Absent, Thrombocytopenia: Absent) - Signed by Eduardo Grade, MD on 06/05/2023 Stage prefix: Initial diagnosis Hemoglobin (Hgb) (g/dL): 16 Platelet count (Z61W9/U of blood): 156   06/05/2023 Initial Diagnosis   CLL (chronic lymphocytic leukemia) (HCC)   -08/14/2020: 9.1  cm renal mass with IVC thrombus to the level - 09/04/2020: Surgery: Open right nephrectomy with IVC thrombectomy  - Clear-cell RCC, grade 4, invades IVC involved - Sarcomatoid features present - pT3c N0/11 R0 -01/09/2023: CT CAP: Lungs: 6 mm right apical pulmonary nodule that was not seen prior.  Additional subcentimeter pulmonary nodules.  Liver: Overall small size and distribution of low-attenuation lesions.  The lesion in the right hepatic dome and measures approximately 1.8 into 3.5 cm, no new suspicious focal findings.  Kidneys: Right nephrectomy with similar soft tissue nodules in the resection bed, with an index nodule measuring 1.4 and 1 cm.  No left hydronephrosis.  Similar exophytic left renal cyst -04/10/2022: CT CAP: Findings concerning for progressive metastatic disease including enlarging hepatic metastasis, increased nodularity within the right nephrectomy bed, enlarging pulmonary nodules, and worsening multistation lymphadenopathy.  Interval development of a wedge-shaped focus of hypoattenuation in the superior spleen, favored to represent interval infarct.  Similar 3 cm fusiform infrarenal abdominal aortic aneurysm -04/23/2022: Ultrasound liver biopsy revealed SLL/CLL on core biopsy with atypical lymphoid cells on liver FNA (CLL component is likely blood contaminant) -05/26/2022: Repeat biopsy of liver confirmed RCC -06/17/2022: Pembrolizumab  every 42 days -07/2022: Started axitinib  later than pembrolizumab  due to delay in access.  He is currently on 3 mg twice a day and did not tolerate 6 mg twice a day with nausea and loss of appetite -06/11/2023: Held Axitinib  for malignant intertrigo   Current Treatment: Pembrolizumab  plus axitinib   INTERVAL HISTORY:  George Anthony is here today for severe intertrigo. Patient is accompanied by his wife today.  He has experienced significant improvement in his rash, which was previously severe and located on his left testicle. The rash is now much  better and no longer bleeding, although the right side remains more erythematous compared to the left. He attributes the improvement to discontinuing a particular medication and using Desenex fungal powders.  Overall, he reports feeling 100% better.  He is continuing to smoke and is reluctant to quit  I have reviewed the past medical history, past surgical history, social history and family history with the patient and they are unchanged from previous note.  ALLERGIES:  has no known allergies.  MEDICATIONS:  Current Outpatient Medications  Medication Sig Dispense Refill   acetaminophen  (TYLENOL ) 500 MG tablet Take 1,300 mg by mouth every 6 (six) hours as needed for moderate pain. 650 mg tab     apixaban  (ELIQUIS ) 5 MG TABS tablet Take 1 tablet (5 mg total) by mouth 2 (two) times daily. 180 tablet 0   atorvastatin  (LIPITOR) 40 MG tablet Take 1 tablet (40 mg total) by mouth at bedtime. 90 tablet 1   axitinib  (INLYTA ) 1 MG tablet Take 3 tablets (3 mg total) by mouth 2 (two) times daily. 180 tablet 2   benzonatate  (TESSALON  PERLES) 100 MG capsule Take 2 capsules (200 mg total) by mouth 3 (three) times daily as needed. 90 capsule 0   escitalopram  (LEXAPRO ) 10 MG tablet Take 1 tablet by mouth once daily 90 tablet 0   fluconazole  (DIFLUCAN ) 100 MG tablet Take 1.5 tablets (150 mg total) by mouth once a week.  6 tablet 0   glipiZIDE  (GLUCOTROL ) 5 MG tablet Take 1 tablet (5 mg total) by mouth 2 (two) times daily before a meal. 180 tablet 1   glucose blood test strip Check TID and PRN dx E11.9 100 each 11   lisinopril  (ZESTRIL ) 10 MG tablet Take 1 tablet (10 mg total) by mouth daily. 90 tablet 1   metFORMIN  (GLUCOPHAGE ) 1000 MG tablet TAKE 1 TABLET BY MOUTH TWICE DAILY WITH A MEAL 180 tablet 0   terbinafine  (LAMISIL ) 1 % cream Apply 1 Application topically 2 (two) times daily. 30 g 2   No current facility-administered medications for this visit.    REVIEW OF SYSTEMS:   Constitutional: Denies fevers,  chills or abnormal weight loss Eyes: Denies blurriness of vision Ears, nose, mouth, throat, and face: Denies mucositis or sore throat Respiratory: Denies cough, dyspnea or wheezes Cardiovascular: Denies palpitation, chest discomfort or lower extremity swelling Gastrointestinal:  Denies nausea, heartburn or change in bowel habits Skin: Denies abnormal skin rashes Lymphatics: Denies new lymphadenopathy or easy bruising Neurological:Denies numbness, tingling or new weaknesses Behavioral/Psych: Mood is stable, no new changes  All other systems were reviewed with the patient and are negative.   VITALS:   Blood pressure 132/68, pulse 79, temperature 97.8 F (36.6 C), temperature source Oral, resp. rate 20, weight 222 lb 12.8 oz (101.1 kg), SpO2 98%.  Wt Readings from Last 3 Encounters:  06/22/23 222 lb 12.8 oz (101.1 kg)  06/11/23 215 lb 3.2 oz (97.6 kg)  06/04/23 215 lb 2.7 oz (97.6 kg)    Body mass index is 38.99 kg/m.  Performance status (ECOG): 1 - Symptomatic but completely ambulatory  PHYSICAL EXAM:   GENERAL:alert, no distress and comfortable SKIN: Diffuse rash of the skin folds of abdomen extending up to left side groin and slightly over the testicles-improved from prior.  Right-sided rash looks a little more erythematous compared to previous time. OROPHARYNX:no exudate, no erythema and lips, buccal mucosa, and tongue normal  LUNGS: clear to auscultation and percussion with normal breathing effort HEART: regular rate & rhythm and no murmurs and no lower extremity edema ABDOMEN:abdomen soft, non-tender and normal bowel sounds Musculoskeletal:no cyanosis of digits and no clubbing  NEURO: alert & oriented x 3 with fluent speech         LABORATORY DATA:  I have reviewed the data as listed    Component Value Date/Time   NA 135 10/16/2022 1220   K 5.2 10/16/2022 1220   CL 100 10/16/2022 1220   CO2 20 10/16/2022 1220   GLUCOSE 130 (H) 10/16/2022 1220   GLUCOSE 264 (H)  09/30/2022 0500   BUN 12 10/16/2022 1220   CREATININE 1.03 10/16/2022 1220   CALCIUM  7.9 (L) 10/16/2022 1220   PROT 6.3 10/16/2022 1220   ALBUMIN 3.9 10/16/2022 1220   AST 15 10/16/2022 1220   ALT 22 10/16/2022 1220   ALKPHOS 129 (H) 10/16/2022 1220   BILITOT 0.5 10/16/2022 1220   GFRNONAA >60 09/30/2022 0500   GFRAA 123 08/17/2019 1636    Lab Results  Component Value Date   WBC 20.6 (HH) 10/16/2022   NEUTROABS 6.5 10/16/2022   HGB 14.2 10/16/2022   HCT 44.6 10/16/2022   MCV 93 10/16/2022   PLT 128 (L) 10/16/2022      Chemistry      Component Value Date/Time   NA 135 10/16/2022 1220   K 5.2 10/16/2022 1220   CL 100 10/16/2022 1220   CO2 20 10/16/2022 1220   BUN  12 10/16/2022 1220   CREATININE 1.03 10/16/2022 1220      Component Value Date/Time   CALCIUM  7.9 (L) 10/16/2022 1220   ALKPHOS 129 (H) 10/16/2022 1220   AST 15 10/16/2022 1220   ALT 22 10/16/2022 1220   BILITOT 0.5 10/16/2022 1220       RADIOGRAPHIC STUDIES: I have personally reviewed the radiological images as listed and agreed with the findings in the report. No results found.

## 2023-06-22 NOTE — Assessment & Plan Note (Signed)
 Continued tobacco use, a risk factor for kidney cancer.  Advised to quit smoking to reduce health risks.  - Provide resources and support for smoking cessation.

## 2023-06-22 NOTE — Assessment & Plan Note (Signed)
 Patient had incidental diagnosis of CLL.  RAI stage 0. Asymptomatic.  - Will continue to monitor counts - Does not meet criteria for treatment at this time

## 2023-06-22 NOTE — Patient Instructions (Signed)
 VISIT SUMMARY:  During today's visit, we discussed the significant improvement in your rash, which was previously severe but has now improved considerably. We also reviewed the side effects you are experiencing from your cancer treatment, including changes in taste and weight gain. Additionally, we talked about your long-term tobacco use and its impact on your health.  YOUR PLAN:  -KIDNEY CANCER: You have metastatic kidney cancer, which means the cancer has spread from its original site to other parts of your body. You are currently receiving infusion therapy every six weeks, and we have temporarily stopped your oral medication due to side effects. We will evaluate the possibility of switching to a new oral medication at your next visit.  -RASH DUE TO MEDICATION: Your rash, which was likely caused by your medication, has improved significantly after stopping the oral medication and using Desenex fungal powder. The left side of the rash is much better, but the right side is still red. Continue using Desenex and keep the affected area clean and dry. We will reassess the rash at your next visit.  -TOBACCO USE: You have been smoking since early adolescence, which is harmful to your health. Although you are aware of the risks, you have not expressed a desire to quit. We will discuss smoking cessation options at future visits.  INSTRUCTIONS:  Please continue with your current infusion therapy and use Desenex fungal powder on the affected area of your rash. Keep the area clean and dry. We will evaluate the possibility of switching to a new oral medication at your next visit. Additionally, we will discuss smoking cessation options in the future. If you experience any new symptoms or have concerns, please contact our office.

## 2023-06-29 ENCOUNTER — Other Ambulatory Visit: Payer: Self-pay | Admitting: Oncology

## 2023-06-29 NOTE — Progress Notes (Signed)
 Pharmacist Chemotherapy Monitoring - Initial Assessment    Anticipated start date: 07/03/23   The following has been reviewed per standard work regarding the patient's treatment regimen: The patient's diagnosis, treatment plan and drug doses, and organ/hematologic function Lab orders and baseline tests specific to treatment regimen  The treatment plan start date, drug sequencing, and pre-medications Prior authorization status  Patient's documented medication list, including drug-drug interaction screen and prescriptions for anti-emetics and supportive care specific to the treatment regimen The drug concentrations, fluid compatibility, administration routes, and timing of the medications to be used The patient's access for treatment and lifetime cumulative dose history, if applicable  The patient's medication allergies and previous infusion related reactions, if applicable   Changes made to treatment plan:  N/A  Follow up needed:  N/A   Artie Laster, [facility], 06/29/2023  4:09 PM

## 2023-06-30 ENCOUNTER — Other Ambulatory Visit: Payer: Self-pay | Admitting: Family Medicine

## 2023-06-30 DIAGNOSIS — Z86718 Personal history of other venous thrombosis and embolism: Secondary | ICD-10-CM

## 2023-06-30 NOTE — Progress Notes (Signed)
 Patient previously received Keytruda  400mg  q 42 days from [facility].  Last dose given on 05/18/23.  Orders received to adjust treatment plan to Keytruda  400 mg IVPB q 42 days.   T.O. Dr Roberta Chin, PharmD

## 2023-07-03 ENCOUNTER — Inpatient Hospital Stay

## 2023-07-03 ENCOUNTER — Inpatient Hospital Stay: Admitting: Oncology

## 2023-07-03 ENCOUNTER — Telehealth: Payer: Self-pay | Admitting: Pharmacist

## 2023-07-03 ENCOUNTER — Other Ambulatory Visit: Payer: Self-pay

## 2023-07-03 ENCOUNTER — Telehealth: Payer: Self-pay | Admitting: Pharmacy Technician

## 2023-07-03 ENCOUNTER — Other Ambulatory Visit (HOSPITAL_COMMUNITY): Payer: Self-pay

## 2023-07-03 ENCOUNTER — Other Ambulatory Visit: Payer: Self-pay | Admitting: Oncology

## 2023-07-03 VITALS — BP 116/68 | HR 68 | Temp 97.6°F | Resp 18

## 2023-07-03 VITALS — BP 104/56 | HR 80 | Temp 96.0°F | Resp 16

## 2023-07-03 DIAGNOSIS — C7800 Secondary malignant neoplasm of unspecified lung: Secondary | ICD-10-CM | POA: Diagnosis not present

## 2023-07-03 DIAGNOSIS — Z7984 Long term (current) use of oral hypoglycemic drugs: Secondary | ICD-10-CM | POA: Diagnosis not present

## 2023-07-03 DIAGNOSIS — C787 Secondary malignant neoplasm of liver and intrahepatic bile duct: Secondary | ICD-10-CM | POA: Diagnosis not present

## 2023-07-03 DIAGNOSIS — Z905 Acquired absence of kidney: Secondary | ICD-10-CM | POA: Diagnosis not present

## 2023-07-03 DIAGNOSIS — E1121 Type 2 diabetes mellitus with diabetic nephropathy: Secondary | ICD-10-CM | POA: Diagnosis not present

## 2023-07-03 DIAGNOSIS — E039 Hypothyroidism, unspecified: Secondary | ICD-10-CM | POA: Diagnosis not present

## 2023-07-03 DIAGNOSIS — C9110 Chronic lymphocytic leukemia of B-cell type not having achieved remission: Secondary | ICD-10-CM

## 2023-07-03 DIAGNOSIS — Z79899 Other long term (current) drug therapy: Secondary | ICD-10-CM | POA: Diagnosis not present

## 2023-07-03 DIAGNOSIS — N179 Acute kidney failure, unspecified: Secondary | ICD-10-CM | POA: Diagnosis not present

## 2023-07-03 DIAGNOSIS — Z86718 Personal history of other venous thrombosis and embolism: Secondary | ICD-10-CM | POA: Diagnosis not present

## 2023-07-03 DIAGNOSIS — Z720 Tobacco use: Secondary | ICD-10-CM

## 2023-07-03 DIAGNOSIS — L304 Erythema intertrigo: Secondary | ICD-10-CM

## 2023-07-03 DIAGNOSIS — E860 Dehydration: Secondary | ICD-10-CM | POA: Diagnosis not present

## 2023-07-03 DIAGNOSIS — Z5112 Encounter for antineoplastic immunotherapy: Secondary | ICD-10-CM | POA: Diagnosis not present

## 2023-07-03 DIAGNOSIS — E11649 Type 2 diabetes mellitus with hypoglycemia without coma: Secondary | ICD-10-CM | POA: Diagnosis not present

## 2023-07-03 DIAGNOSIS — F17210 Nicotine dependence, cigarettes, uncomplicated: Secondary | ICD-10-CM | POA: Diagnosis not present

## 2023-07-03 DIAGNOSIS — L03116 Cellulitis of left lower limb: Secondary | ICD-10-CM | POA: Diagnosis not present

## 2023-07-03 DIAGNOSIS — C641 Malignant neoplasm of right kidney, except renal pelvis: Secondary | ICD-10-CM

## 2023-07-03 DIAGNOSIS — L03115 Cellulitis of right lower limb: Secondary | ICD-10-CM | POA: Diagnosis not present

## 2023-07-03 DIAGNOSIS — E162 Hypoglycemia, unspecified: Secondary | ICD-10-CM | POA: Diagnosis not present

## 2023-07-03 DIAGNOSIS — C9111 Chronic lymphocytic leukemia of B-cell type in remission: Secondary | ICD-10-CM | POA: Diagnosis not present

## 2023-07-03 DIAGNOSIS — R7989 Other specified abnormal findings of blood chemistry: Secondary | ICD-10-CM

## 2023-07-03 LAB — HEMOGLOBIN A1C
Hgb A1c MFr Bld: 6.1 % — ABNORMAL HIGH (ref 4.8–5.6)
Mean Plasma Glucose: 128.37 mg/dL

## 2023-07-03 LAB — CBC WITH DIFFERENTIAL/PLATELET
Abs Immature Granulocytes: 0 10*3/uL (ref 0.00–0.07)
Basophils Absolute: 0 10*3/uL (ref 0.0–0.1)
Basophils Relative: 0 %
Eosinophils Absolute: 0.4 10*3/uL (ref 0.0–0.5)
Eosinophils Relative: 1 %
HCT: 44.8 % (ref 39.0–52.0)
Hemoglobin: 14.4 g/dL (ref 13.0–17.0)
Lymphocytes Relative: 78 %
Lymphs Abs: 34.9 10*3/uL — ABNORMAL HIGH (ref 0.7–4.0)
MCH: 30.8 pg (ref 26.0–34.0)
MCHC: 32.1 g/dL (ref 30.0–36.0)
MCV: 95.9 fL (ref 80.0–100.0)
Monocytes Absolute: 1.3 10*3/uL — ABNORMAL HIGH (ref 0.1–1.0)
Monocytes Relative: 3 %
Neutro Abs: 8.1 10*3/uL — ABNORMAL HIGH (ref 1.7–7.7)
Neutrophils Relative %: 18 %
Platelets: 151 10*3/uL (ref 150–400)
RBC: 4.67 MIL/uL (ref 4.22–5.81)
RDW: 14.9 % (ref 11.5–15.5)
WBC: 44.8 10*3/uL — ABNORMAL HIGH (ref 4.0–10.5)
nRBC: 0 % (ref 0.0–0.2)

## 2023-07-03 LAB — COMPREHENSIVE METABOLIC PANEL WITH GFR
ALT: 27 U/L (ref 0–44)
AST: 47 U/L — ABNORMAL HIGH (ref 15–41)
Albumin: 4 g/dL (ref 3.5–5.0)
Alkaline Phosphatase: 77 U/L (ref 38–126)
Anion gap: 12 (ref 5–15)
BUN: 20 mg/dL (ref 8–23)
CO2: 23 mmol/L (ref 22–32)
Calcium: 8.8 mg/dL — ABNORMAL LOW (ref 8.9–10.3)
Chloride: 90 mmol/L — ABNORMAL LOW (ref 98–111)
Creatinine, Ser: 1.91 mg/dL — ABNORMAL HIGH (ref 0.61–1.24)
GFR, Estimated: 37 mL/min — ABNORMAL LOW (ref >60)
Glucose, Bld: 33 mg/dL — CL (ref 70–99)
Potassium: 5 mmol/L (ref 3.5–5.1)
Sodium: 125 mmol/L — ABNORMAL LOW (ref 135–145)
Total Bilirubin: 0.7 mg/dL (ref 0.0–1.2)
Total Protein: 6.7 g/dL (ref 6.5–8.1)

## 2023-07-03 LAB — GLUCOSE, CAPILLARY
Glucose-Capillary: 29 mg/dL — CL (ref 70–99)
Glucose-Capillary: 35 mg/dL — CL (ref 70–99)
Glucose-Capillary: 77 mg/dL (ref 70–99)

## 2023-07-03 LAB — TSH: TSH: 102.382 u[IU]/mL — ABNORMAL HIGH (ref 0.350–4.500)

## 2023-07-03 MED ORDER — LEVOTHYROXINE SODIUM 50 MCG PO TABS: 50.0000 ug | ORAL_TABLET | Freq: Every day | ORAL | 2 refills | Status: DC

## 2023-07-03 MED ORDER — SODIUM CHLORIDE 0.9 % IV SOLN: Freq: Once | INTRAVENOUS | Status: AC

## 2023-07-03 MED ORDER — SODIUM CHLORIDE 0.9 % IV SOLN: INTRAVENOUS | Status: DC

## 2023-07-03 MED ORDER — LENVATINIB (10 MG DAILY DOSE) 10 MG PO CPPK
10.0000 mg | ORAL_CAPSULE | Freq: Every day | ORAL | 3 refills | Status: DC
  Filled 2023-07-08: qty 30, 30d supply, fill #0
  Filled 2023-08-05 – 2023-08-12 (×2): qty 30, 30d supply, fill #1
  Filled 2023-09-04 – 2023-09-22 (×3): qty 30, 30d supply, fill #2
  Filled 2023-10-19 – 2023-10-22 (×2): qty 30, 30d supply, fill #3

## 2023-07-03 MED ORDER — DEXTROSE 5 % IV SOLN: INTRAVENOUS | Status: DC

## 2023-07-03 MED ORDER — SODIUM CHLORIDE 0.9 % IV SOLN
400.0000 mg | Freq: Once | INTRAVENOUS | Status: AC
  Administered 2023-07-03: 400 mg via INTRAVENOUS
  Filled 2023-07-03: qty 16

## 2023-07-03 NOTE — Assessment & Plan Note (Signed)
 Likely secondary to immunotherapy TSH: 108  - Will start levothyroxine 50 mcg daily - Will monitor levels every 6 weeks

## 2023-07-03 NOTE — Progress Notes (Signed)
 1047 Blood sugar is 35

## 2023-07-03 NOTE — Assessment & Plan Note (Signed)
 Patient had incidental diagnosis of CLL.  RAI stage 0. Asymptomatic. More than 50% increase in lymphocytes at this time   - Will obtain labs in 1 month prior to next visit - If lymphocytes continue to trend up and patient develops symptoms, can consider starting on treatment.

## 2023-07-03 NOTE — Progress Notes (Signed)
 CRITICAL VALUE ALERT Critical value received:  blood sugar 30 Date of notification:  07-03-23 Time of notification: 30 Critical value read back:  Yes.   Nurse who received alert:  C. Elda Dunkerson RN MD notified time and response:  1009  Dr. Orvis Blare notified, gave patient some snack, nabs, gingerale, rechecked blood sugar, meter read blood sugar as 30, pt is alert and oriented. Moved patient back to the infusion room and MD notified for further evaluation. IV started and verbal order to give 250 ml of D5 infusion. Will recheck blood sugar in 10 min from that, patient drinking orange juice and eating apple sauce now. Speech is getting more clear. Patient stated he had not eaten today, he is on diabetic meds. Provider will come back for office visit to further evaluate for possible treatment today.

## 2023-07-03 NOTE — Assessment & Plan Note (Signed)
 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 Significantly improved at this time Completed oral fluconazole  course  - Discontinued axitinib  - Continue to use terbinafine  cream twice daily in the affected area - Instructed to clean affected area with soap and water , pat dry, apply cream, and then apply powder for drying.

## 2023-07-03 NOTE — Assessment & Plan Note (Signed)
 Continued tobacco use, a risk factor for kidney cancer.  Advised to quit smoking to reduce health risks.  - Provide resources and support for smoking cessation.

## 2023-07-03 NOTE — Assessment & Plan Note (Signed)
 Patient has metastatic renal cell carcinoma with biopsy-proven liver metastasis and lung metastasis. Oncology history as below Started on pembrolizumab  every 6 weeks and axitinib  twice daily in May 2024.  Axitinib  was started later in July 2024 due to problems with access Last imaging was in March 2025 with no evidence of progression Held Axitinib  for malignant intertrigo  - Continue pembrolizumab  infusions every 6 weeks.  Will start patient on lenvatinib 10 mg daily.  Will increase the dose if patient tolerates well. - Patient has scheduled CT scan for 07/13/2023.  Will continue obtaining CT imaging every 3 months  Return to clinic 1 week after CT scan to discuss results and further management

## 2023-07-03 NOTE — Assessment & Plan Note (Signed)
 Likely secondary to dehydration  - Will give 1 L normal saline bolus today

## 2023-07-03 NOTE — Progress Notes (Signed)
Treatment given today per MD orders. Tolerated infusion without adverse affects. Vital signs stable. No complaints at this time. Discharged from clinic by wheel chair in stable condition. Alert and oriented x 3. F/U with [facility] as scheduled.   

## 2023-07-03 NOTE — Progress Notes (Signed)
 Patient presents today for chemotherapy infusion. Patient is in satisfactory condition with no new complaints voiced.  Vital signs are stable.  Labs reviewed by Dr. Cheree Cords during the office visit.  Creatinine today is 1.91.  We will give NS 1 L over 2 hours today per Dr. Orvis Blare.  All other labs are within treatment parameters.  Blood glucose now is 77.  We will proceed with treatment per MD orders.

## 2023-07-03 NOTE — Telephone Encounter (Signed)
 Oral Oncology Patient Advocate Encounter   Received notification that prior authorization for Lenvima is required.   PA submitted on 07/03/2023 Key [id number] Status is pending     Thedore Pickel (Patty) Benjaman Branch, CPhT  [facility], [place], [place], [place] Oral Chemotherapy Patient Advocate Phone: [phone]  Fax: [phone]

## 2023-07-03 NOTE — Patient Instructions (Signed)
 [facility] [place] - A DEPT OF [facility]  Discharge Instructions: Thank you for choosing  Cancer Center to provide your oncology and hematology care.  If you have a lab appointment with the Cancer Center - please note that after April 8th, 2024, all labs will be drawn in the cancer center.  You do not have to check in or register with the main entrance as you have in the past but will complete your check-in in the cancer center.  Wear comfortable clothing and clothing appropriate for easy access to any Portacath or PICC line.   We strive to give you quality time with your provider. You may need to reschedule your appointment if you arrive late (15 or more minutes).  Arriving late affects you and other patients whose appointments are after yours.  Also, if you miss three or more appointments without notifying the office, you may be dismissed from the clinic at the provider's discretion.      For prescription refill requests, have your pharmacy contact our office and allow 72 hours for refills to be completed.    Today you received the following chemotherapy and/or immunotherapy agents Keytruda. Pembrolizumab Injection What is this medication? PEMBROLIZUMAB (PEM broe LIZ ue mab) treats some types of cancer. It works by helping your immune system slow or stop the spread of cancer cells. It is a monoclonal antibody. This medicine may be used for other purposes; ask your health care provider or pharmacist if you have questions. COMMON BRAND NAME(S): Keytruda What should I tell my care team before I take this medication? They need to know if you have any of these conditions: Allogeneic stem cell transplant (uses someone else's stem cells) Autoimmune diseases, such as Crohn disease, ulcerative colitis, lupus History of chest radiation Nervous system problems, such as Guillain-Barre syndrome, myasthenia gravis Organ transplant An unusual or allergic reaction to  pembrolizumab, other medications, foods, dyes, or preservatives Pregnant or trying to get pregnant Breast-feeding How should I use this medication? This medication is injected into a vein. It is given by your care team in a hospital or clinic setting. A special MedGuide will be given to you before each treatment. Be sure to read this information carefully each time. Talk to your care team about the use of this medication in children. While it may be prescribed for children as young as 6 months for selected conditions, precautions do apply. Overdosage: If you think you have taken too much of this medicine contact a poison control center or emergency room at once. NOTE: This medicine is only for you. Do not share this medicine with others. What if I miss a dose? Keep appointments for follow-up doses. It is important not to miss your dose. Call your care team if you are unable to keep an appointment. What may interact with this medication? Interactions have not been studied. This list may not describe all possible interactions. Give your health care provider a list of all the medicines, herbs, non-prescription drugs, or dietary supplements you use. Also tell them if you smoke, drink alcohol, or use illegal drugs. Some items may interact with your medicine. What should I watch for while using this medication? Your condition will be monitored carefully while you are receiving this medication. You may need blood work while taking this medication. This medication may cause serious skin reactions. They can happen weeks to months after starting the medication. Contact your care team right away if you notice  fevers or flu-like symptoms with a rash. The rash may be red or purple and then turn into blisters or peeling of the skin. You may also notice a red rash with swelling of the face, lips, or lymph nodes in your neck or under your arms. Tell your care team right away if you have any change in your  eyesight. Talk to your care team if you may be pregnant. Serious birth defects can occur if you take this medication during pregnancy and for 4 months after the last dose. You will need a negative pregnancy test before starting this medication. Contraception is recommended while taking this medication and for 4 months after the last dose. Your care team can help you find the option that works for you. Do not breastfeed while taking this medication and for 4 months after the last dose. What side effects may I notice from receiving this medication? Side effects that you should report to your care team as soon as possible: Allergic reactions--skin rash, itching, hives, swelling of the face, lips, tongue, or throat Dry cough, shortness of breath or trouble breathing Eye pain, redness, irritation, or discharge with blurry or decreased vision Heart muscle inflammation--unusual weakness or fatigue, shortness of breath, chest pain, fast or irregular heartbeat, dizziness, swelling of the ankles, feet, or hands Hormone gland problems--headache, sensitivity to light, unusual weakness or fatigue, dizziness, fast or irregular heartbeat, increased sensitivity to cold or heat, excessive sweating, constipation, hair loss, increased thirst or amount of urine, tremors or shaking, irritability Infusion reactions--chest pain, shortness of breath or trouble breathing, feeling faint or lightheaded Kidney injury (glomerulonephritis)--decrease in the amount of urine, red or dark brown urine, foamy or bubbly urine, swelling of the ankles, hands, or feet Liver injury--right upper belly pain, loss of appetite, nausea, light-colored stool, dark yellow or brown urine, yellowing skin or eyes, unusual weakness or fatigue Pain, tingling, or numbness in the hands or feet, muscle weakness, change in vision, confusion or trouble speaking, loss of balance or coordination, trouble walking, seizures Rash, fever, and swollen lymph  nodes Redness, blistering, peeling, or loosening of the skin, including inside the mouth Sudden or severe stomach pain, bloody diarrhea, fever, nausea, vomiting Side effects that usually do not require medical attention (report to your care team if they continue or are bothersome): Bone, joint, or muscle pain Diarrhea Fatigue Loss of appetite Nausea Skin rash This list may not describe all possible side effects. Call your doctor for medical advice about side effects. You may report side effects to FDA at 1-800-FDA-1088. Where should I keep my medication? This medication is given in a hospital or clinic. It will not be stored at home. NOTE: This sheet is a summary. It may not cover all possible information. If you have questions about this medicine, talk to your doctor, pharmacist, or health care provider.  2024 Elsevier/Gold Standard (2021-05-21 00:00:00)       To help prevent nausea and vomiting after your treatment, we encourage you to take your nausea medication as directed.  BELOW ARE SYMPTOMS THAT SHOULD BE REPORTED IMMEDIATELY: *FEVER GREATER THAN 100.4 F (38 C) OR HIGHER *CHILLS OR SWEATING *NAUSEA AND VOMITING THAT IS NOT CONTROLLED WITH YOUR NAUSEA MEDICATION *UNUSUAL SHORTNESS OF BREATH *UNUSUAL BRUISING OR BLEEDING *URINARY PROBLEMS (pain or burning when urinating, or frequent urination) *BOWEL PROBLEMS (unusual diarrhea, constipation, pain near the anus) TENDERNESS IN MOUTH AND THROAT WITH OR WITHOUT PRESENCE OF ULCERS (sore throat, sores in mouth, or a toothache) UNUSUAL RASH, SWELLING  OR PAIN  UNUSUAL VAGINAL DISCHARGE OR ITCHING   Items with * indicate a potential emergency and should be followed up as soon as possible or go to the Emergency Department if any problems should occur.  Please show the CHEMOTHERAPY ALERT CARD or IMMUNOTHERAPY ALERT CARD at check-in to the Emergency Department and triage nurse.  Should you have questions after your visit or need to  cancel or reschedule your appointment, please contact [facility] [place] - A DEPT OF [facility] [phone]  and follow the prompts.  Office hours are 8:00 a.m. to 4:30 p.m. Monday - Friday. Please note that voicemails left after 4:00 p.m. may not be returned until the following business day.  We are closed weekends and major holidays. You have access to a nurse at all times for urgent questions. Please call the main number to the clinic [phone] and follow the prompts.  For any non-urgent questions, you may also contact your provider using MyChart. We now offer e-Visits for anyone 42 and older to request care online for non-urgent symptoms. For details visit [email or web address].   Also download the MyChart app! Go to the app store, search "MyChart", open the app, select [place], and log in with your MyChart username and password.

## 2023-07-03 NOTE — Telephone Encounter (Signed)
 Oral Oncology Patient Advocate Encounter  Prior Authorization for Lenvima has been approved.    PA# [id number] Effective dates: 07/03/2023 through 01/20/2024.  Patients co-pay is $0.    George Anthony (Patty) Benjaman Branch, CPhT  [facility] - [facility], [place], [place], [place] Oral Chemotherapy Patient Advocate Phone: [phone]  Fax: [phone]

## 2023-07-03 NOTE — Assessment & Plan Note (Signed)
 Patient had severe hypoglycemia with glucose of 33 today Asymptomatic Patient had a diagnosis of diabetes and was continued to take metformin  and did not eat anything from last night  - Will obtain a hemoglobin A1c today -Patient was given food and orange juice along with D5 in the infusion clinic - Recommended patient to hold metformin  at this time - Please follow-up with primary care for further management

## 2023-07-03 NOTE — Progress Notes (Signed)
 Aaron Aas

## 2023-07-03 NOTE — Progress Notes (Signed)
 Patient Care Team: Galvin Jules, FNP as PCP - General (Family Medicine) Vinetta Greening, DO as Consulting Physician (Internal Medicine) [name], MD as Referring Physician (Optometry) Greissinger, April Tanis Fan, [place] (Hematology and Oncology)  Clinic Day:  07/03/2023  Referring physician: Galvin Jules, FNP   CHIEF COMPLAINT:  CC: Renal cell carcinoma    ASSESSMENT & PLAN:   Assessment & Plan: George Anthony  is a 73 y.o. male with metastatic right-sided renal cell carcinoma on pembrolizumab  and axitinib     Renal cell carcinoma of right kidney [facility]) Patient has metastatic renal cell carcinoma with biopsy-proven liver metastasis and lung metastasis. Oncology history as below Started on pembrolizumab  every 6 weeks and axitinib  twice daily in May 2024.  Axitinib  was started later in July 2024 due to problems with access Last imaging was in March 2025 with no evidence of progression Held Axitinib  for malignant intertrigo  - Continue pembrolizumab  infusions every 6 weeks.  Will start patient on lenvatinib 10 mg daily.  Will increase the dose if patient tolerates well. - Patient has scheduled CT scan for 07/13/2023.  Will continue obtaining CT imaging every 3 months  Return to clinic 1 week after CT scan to discuss results and further management  CLL (chronic lymphocytic leukemia) (HCC) Patient had incidental diagnosis of CLL.  RAI stage 0. Asymptomatic. More than 50% increase in lymphocytes at this time   - Will obtain labs in 1 month prior to next visit - If lymphocytes continue to trend up and patient develops symptoms, can consider starting on treatment.  Intertrigo Patient likely has malignant intertrigo secondary to axitinib  use. Significantly improved at this time Completed oral fluconazole  course  - Discontinued axitinib  - Continue to use terbinafine  cream twice daily in the affected area - Instructed to clean affected area with soap and water , pat dry,  apply cream, and then apply powder for drying.  Tobacco use Continued tobacco use, a risk factor for kidney cancer.  Advised to quit smoking to reduce health risks.  - Provide resources and support for smoking cessation.  Hypothyroidism Likely secondary to immunotherapy TSH: 108  - Will start levothyroxine 50 mcg daily - Will monitor levels every 6 weeks  Hypoglycemia Patient had severe hypoglycemia with glucose of 33 today Asymptomatic Patient had a diagnosis of diabetes and was continued to take metformin  and did not eat anything from last night  - Will obtain a hemoglobin A1c today -Patient was given food and orange juice along with D5 in the infusion clinic - Recommended patient to hold metformin  at this time - Please follow-up with primary care for further management  AKI (acute kidney injury) (HCC) Likely secondary to dehydration  - Will give 1 L normal saline bolus today    The patient understands the plans discussed today and is in agreement with them.  He knows to contact our office if he develops concerns prior to his next appointment.  I provided 40 minutes of face-to-face time during this encounter and > 50% was spent counseling as documented under my assessment and plan.    George Grade, MD  [facility] [place] - A DEPT OF [facility] [address] [place] [address] Dept: [phone] Dept Fax: [phone]   Orders Placed This Encounter  Procedures   Hemoglobin A1c    Standing Status:   Standing    Number of Occurrences:   1     ONCOLOGY  HISTORY:   Oncology History  Renal cell carcinoma of right kidney (HCC)  08/23/2020 Initial Diagnosis   Renal cell carcinoma, right (HCC)   05/26/2022 Cancer Staging   Staging form: Kidney, AJCC 8th Edition - Clinical stage from 05/26/2022: Stage IV (cM1) - Signed by George Grade, MD on 06/05/2023 Stage prefix: Recurrence   07/03/2023 -   Chemotherapy   Patient is on Treatment Plan : RENAL CELL Pembrolizumab  (400) + Axitinib  q42d     CLL (chronic lymphocytic leukemia) (HCC)  04/21/2022 Cancer Staging   Staging form: Chronic Lymphocytic Leukemia / Small Lymphocytic Lymphoma, AJCC 8th Edition - Clinical stage from 04/21/2022: Modified Rai Stage 0 (Modified Rai risk: Low, Lymphocytosis: Present, Adenopathy: Unknown, Organomegaly: Absent, Anemia: Absent, Thrombocytopenia: Absent) - Signed by George Grade, MD on 06/05/2023 Stage prefix: Initial diagnosis Hemoglobin (Hgb) (g/dL): 16 Platelet count (Z61W9/U of blood): 156   06/05/2023 Initial Diagnosis   CLL (chronic lymphocytic leukemia) (HCC)   -08/14/2020: 9.1 cm renal mass with IVC thrombus to the level - 09/04/2020: Surgery: Open right nephrectomy with IVC thrombectomy  - Clear-cell RCC, Anthony 4, invades IVC involved - Sarcomatoid features present - pT3c N0/11 R0 -01/09/2023: CT CAP: Lungs: 6 mm right apical pulmonary nodule that was not seen prior.  Additional subcentimeter pulmonary nodules.  Liver: Overall small size and distribution of low-attenuation lesions.  The lesion in the right hepatic dome and measures approximately 1.8 into 3.5 cm, no new suspicious focal findings.  Kidneys: Right nephrectomy with similar soft tissue nodules in the resection bed, with an index nodule measuring 1.4 and 1 cm.  No left hydronephrosis.  Similar exophytic left renal cyst -04/10/2022: CT CAP: Findings concerning for progressive metastatic disease including enlarging hepatic metastasis, increased nodularity within the right nephrectomy bed, enlarging pulmonary nodules, and worsening multistation lymphadenopathy.  Interval development of a wedge-shaped focus of hypoattenuation in the superior spleen, favored to represent interval infarct.  Similar 3 cm fusiform infrarenal abdominal aortic aneurysm -04/23/2022: Ultrasound liver biopsy revealed SLL/CLL on core biopsy with atypical lymphoid cells on  liver FNA (CLL component is likely blood contaminant) -05/26/2022: Repeat biopsy of liver confirmed RCC -06/17/2022: Pembrolizumab  every 42 days -07/2022: Started axitinib  later than pembrolizumab  due to delay in access.  He is currently on 3 mg twice a day and did not tolerate 6 mg twice a day with nausea and loss of appetite -06/11/2023: Held Axitinib  for malignant intertrigo   Current Treatment: Pembrolizumab  plus axitinib   INTERVAL HISTORY:  Dalen [place]enn[place] is here today for follow-up.  He was seen in infusion clinic.  Patient had a very low blood sugar of 33 but was asymptomatic.  He was talking, alert and oriented.  His intertrigo has improved significantly.  Patient has not taken axitinib  since we recommended not to.  The patient has no complaints today and has been doing well.  We discussed that his labs are consistent with hypothyroidism and we would start him on levothyroxine.  He is asymptomatic.   I have reviewed the past medical history, past surgical history, social history and family history with the patient and they are unchanged from previous note.  ALLERGIES:  has no known allergies.  MEDICATIONS:  Current Outpatient Medications  Medication Sig Dispense Refill   acetaminophen  (TYLENOL ) 500 MG tablet Take 1,300 mg by mouth every 6 (six) hours as needed for moderate pain. 650 mg tab     atorvastatin  (LIPITOR) 40 MG tablet Take 1 tablet (40 mg total) by mouth at bedtime. 90 tablet  1   axitinib  (INLYTA ) 1 MG tablet Take 3 tablets (3 mg total) by mouth 2 (two) times daily. 180 tablet 2   benzonatate  (TESSALON  PERLES) 100 MG capsule Take 2 capsules (200 mg total) by mouth 3 (three) times daily as needed. 90 capsule 0   ELIQUIS  5 MG TABS tablet Take 1 tablet by mouth twice daily 180 tablet 0   escitalopram  (LEXAPRO ) 10 MG tablet Take 1 tablet by mouth once daily 90 tablet 0   fluconazole  (DIFLUCAN ) 100 MG tablet Take 1.5 tablets (150 mg total) by mouth once a week. 6 tablet 0    glipiZIDE  (GLUCOTROL ) 5 MG tablet Take 1 tablet (5 mg total) by mouth 2 (two) times daily before a meal. 180 tablet 1   glucose blood test strip Check TID and PRN dx E11.9 100 each 11   levothyroxine (SYNTHROID) 50 MCG tablet Take 1 tablet (50 mcg total) by mouth daily before breakfast. 30 tablet 2   lisinopril  (ZESTRIL ) 10 MG tablet Take 1 tablet (10 mg total) by mouth daily. 90 tablet 1   metFORMIN  (GLUCOPHAGE ) 1000 MG tablet TAKE 1 TABLET BY MOUTH TWICE DAILY WITH A MEAL 180 tablet 0   prochlorperazine (COMPAZINE) 10 MG tablet Take 10 mg by mouth every 6 (six) hours as needed.     terbinafine  (LAMISIL ) 1 % cream Apply 1 Application topically 2 (two) times daily. 30 g 2   Current Facility-Administered Medications  Medication Dose Route Frequency Provider Last Rate Last Admin   dextrose 5 % solution   Intravenous Continuous George Grade, MD   Stopped at 07/03/23 1143   Facility-Administered Medications Ordered in Other Visits  Medication Dose Route Frequency Provider Last Rate Last Admin   0.9 %  sodium chloride  infusion   Intravenous Continuous George Latendresse, MD   Stopped at 07/03/23 1431    REVIEW OF SYSTEMS:   Constitutional: Denies fevers, chills or abnormal weight loss Eyes: Denies blurriness of vision Ears, nose, mouth, throat, and face: Denies mucositis or sore throat Respiratory: Denies cough, dyspnea or wheezes Cardiovascular: Denies palpitation, chest discomfort or lower extremity swelling Gastrointestinal:  Denies nausea, heartburn or change in bowel habits Skin: Denies abnormal skin rashes Lymphatics: Denies new lymphadenopathy or easy bruising Neurological:Denies numbness, tingling or new weaknesses Behavioral/Psych: Mood is stable, no new changes  All other systems were reviewed with the patient and are negative.   VITALS:   Blood pressure (!) 104/56, pulse 80, temperature (!) 96 F (35.6 C), temperature source Oral, resp. rate 16, SpO2 96%.  Wt Readings  from Last 3 Encounters:  06/22/23 222 lb 12.8 oz (101.1 kg)  06/11/23 215 lb 3.2 oz (97.6 kg)  06/04/23 215 lb 2.7 oz (97.6 kg)    There is no height or weight on file to calculate BMI.  Performance status (ECOG): 1 - Symptomatic but completely ambulatory  PHYSICAL EXAM:   GENERAL:alert, no distress and comfortable SKIN: Left side intertrigo improved significantly with just some dark discoloration.  Right side intertrigo also improved significantly but with some erythema.  No bloody discharge at this time. OROPHARYNX:no exudate, no erythema and lips, buccal mucosa, and tongue normal  LUNGS: clear to auscultation and percussion with normal breathing effort HEART: regular rate & rhythm and no murmurs and no lower extremity edema ABDOMEN:abdomen soft, non-tender and normal bowel sounds Musculoskeletal:no cyanosis of digits and no clubbing  NEURO: alert & oriented x 3 with fluent speech    LABORATORY DATA:  I have reviewed the data as  listed    Component Value Date/Time   NA 125 (L) 07/03/2023 0930   NA 135 10/16/2022 1220   K 5.0 07/03/2023 0930   CL 90 (L) 07/03/2023 0930   CO2 23 07/03/2023 0930   GLUCOSE 33 (LL) 07/03/2023 0930   BUN 20 07/03/2023 0930   BUN 12 10/16/2022 1220   CREATININE 1.91 (H) 07/03/2023 0930   CALCIUM  8.8 (L) 07/03/2023 0930   PROT 6.7 07/03/2023 0930   PROT 6.3 10/16/2022 1220   ALBUMIN 4.0 07/03/2023 0930   ALBUMIN 3.9 10/16/2022 1220   AST 47 (H) 07/03/2023 0930   ALT 27 07/03/2023 0930   ALKPHOS 77 07/03/2023 0930   BILITOT 0.7 07/03/2023 0930   BILITOT 0.5 10/16/2022 1220   GFRNONAA 37 (L) 07/03/2023 0930   GFRAA 123 08/17/2019 1636    Lab Results  Component Value Date   WBC 44.8 (H) 07/03/2023   NEUTROABS 8.1 (H) 07/03/2023   HGB 14.4 07/03/2023   HCT 44.8 07/03/2023   MCV 95.9 07/03/2023   PLT 151 07/03/2023      Chemistry      Component Value Date/Time   NA 125 (L) 07/03/2023 0930   NA 135 10/16/2022 1220   K 5.0  07/03/2023 0930   CL 90 (L) 07/03/2023 0930   CO2 23 07/03/2023 0930   BUN 20 07/03/2023 0930   BUN 12 10/16/2022 1220   CREATININE 1.91 (H) 07/03/2023 0930      Component Value Date/Time   CALCIUM  8.8 (L) 07/03/2023 0930   ALKPHOS 77 07/03/2023 0930   AST 47 (H) 07/03/2023 0930   ALT 27 07/03/2023 0930   BILITOT 0.7 07/03/2023 0930   BILITOT 0.5 10/16/2022 1220      Latest Reference Range & Units 07/03/23 09:30  TSH 0.350 - 4.500 uIU/mL 102.382 (H)  (H): Data is abnormally high  RADIOGRAPHIC STUDIES: I have personally reviewed the radiological images as listed and agreed with the findings in the report. No results found.

## 2023-07-03 NOTE — Telephone Encounter (Signed)
 Clinical Pharmacist Practitioner Encounter   Received new prescription for Lenvima (lenvatinib) for the treatment of metastatic RCC in conjunction with pembrolizumab , planned duration until disease progression or unacceptable drug toxicity. PAtient will be switching from axitinib  due to intolerance.   Labs from *** assessed, ***. Prescription dose and frequency assessed.   Current medication list in Epic reviewed, *** DDIs with *** identified: Escitalopram :  QT-prolonging Agents like lenvatinib may increase QTc-prolonging effects of Escitalopram . It is recommended to Consider alternatives to this combination. If use is necessary, monitor for QTc interval prolongation and arrhythmias.  Patient's last ECG found in EPIC was in 08/2020 [facility] Everywhere)  MD is having patient patient come in for ECG check on 07/10/23 prior to starting therapy.  Evaluated chart and *** patient barriers to medication adherence identified.   Prescription has been e-scribed to the [facility] for benefits analysis and approval.  Oral Oncology Clinic will continue to follow for insurance authorization, copayment issues, initial counseling and start date.  Patient agreed to treatment on *** per MD documentation.  Tressy Kunzman N. Erby Sanderson, PharmD, BCOP, CPP Hematology/Oncology Clinical Pharmacist [facility] Oral Chemotherapy Navigation Clinic [phone]  07/03/2023 3:48 PM

## 2023-07-04 ENCOUNTER — Other Ambulatory Visit: Payer: Self-pay | Admitting: Family Medicine

## 2023-07-04 DIAGNOSIS — R600 Localized edema: Secondary | ICD-10-CM

## 2023-07-04 LAB — T4: T4, Total: <0.4 ug/dL — ABNORMAL LOW (ref 4.5–12.0)

## 2023-07-06 ENCOUNTER — Telehealth: Payer: Self-pay | Admitting: *Deleted

## 2023-07-06 ENCOUNTER — Other Ambulatory Visit: Payer: Self-pay | Admitting: Family Medicine

## 2023-07-06 DIAGNOSIS — R600 Localized edema: Secondary | ICD-10-CM

## 2023-07-06 NOTE — Telephone Encounter (Signed)
 Spoke with patient and wife to schedule EKG to be done on Friday 6/20 as a baseline due to initiation of lenvatinib in combination with Lexapro , per Dr. Orvis Blare.  Made them aware not to start lenvatinib until after EKG was obtained.  Verbalized understanding.

## 2023-07-07 LAB — SURGICAL PATHOLOGY

## 2023-07-08 ENCOUNTER — Other Ambulatory Visit: Payer: Self-pay

## 2023-07-08 ENCOUNTER — Other Ambulatory Visit (HOSPITAL_COMMUNITY): Payer: Self-pay

## 2023-07-08 ENCOUNTER — Other Ambulatory Visit: Payer: Self-pay | Admitting: Pharmacy Technician

## 2023-07-08 NOTE — Telephone Encounter (Signed)
 Clinical Pharmacist Practitioner Encounter   [facility] will deliver medication to patient on Monday 07/13/23. Patient was offered the option of Friday 07/10/23 delivery but preferred Monday.   Patient Education I spoke with patient and his wife for overview of new oral chemotherapy medication: Lenvima (lenvatinib) for the treatment of metastatic RCC in conjunction with pembrolizumab , planned duration until disease progression or unacceptable drug toxicity. PAtient will be switching from axitinib  due to intolerance.   Counseled patient on administration, dosing, side effects, monitoring, drug-food interactions, safe handling, storage, and disposal. Patient will take 1 capsule (10 mg total) by mouth daily.  Side Effects: Side effects include but not limited to: Hand-foot syndrome, diarrhea, N/V, fatigue, mouth sores, increased BP (HTN) Diarrhea: discussed with patient that depending on which medication is causing the diarrhea, that determines the best management for the diarrhea. They should call the office if diarrhea occurs, they can start loperamide but still needs to call the office. Hand-foot syndrome: patient will order Udderly Smooth Extra Care 20, recommended they keep hands and feet moisturized  HTN: Currently well controlled, reviewed s/sx of HTN, they do not have a blood pressure cuff at home Mouth sores: pt instructed to call the office to obtain magic mouthwash if needed N/V: pt reported having antiemetic medication at home to use as needed.     Reviewed with patient importance of keeping a medication schedule and plan for any missed doses.  After discussion with patient no patient barriers to medication adherence identified.   The Ganns voiced understanding and appreciation. All questions answered. Medication handout provided.  Provided patient with Oral Chemotherapy Navigation Clinic phone number. Patient knows to call the office with questions or concerns.  Oral Chemotherapy Navigation Clinic will continue to follow.  Lando Alcalde N. Janis Cuffe, PharmD, BCOP, CPP Hematology/Oncology Clinical Pharmacist [facility] Oral Chemotherapy Navigation Clinic [phone]  07/08/2023 9:39 AM

## 2023-07-08 NOTE — Progress Notes (Signed)
 Patient education documented in EPIC note on 07/08/23.

## 2023-07-08 NOTE — Progress Notes (Signed)
 Specialty Pharmacy Initial Fill Coordination Note  George Anthony is a 73 y.o. male contacted today regarding refills of specialty medication(s) Lenvatinib Mesylate (LENVIMA) .  Patient requested Delivery  on 07/13/23  to verified address [address] [place] [address]   Medication will be filled on 06/20.   Patient is aware of $0 copayment.   Glenda Kunst (Patty) Benjaman Branch, CPhT  [facility], [place], [place]ine Done, [place] Oral Chemotherapy Patient Advocate Phone: [phone]  Fax: [phone]

## 2023-07-09 ENCOUNTER — Other Ambulatory Visit: Payer: Self-pay | Admitting: *Deleted

## 2023-07-09 ENCOUNTER — Other Ambulatory Visit: Payer: Self-pay

## 2023-07-09 DIAGNOSIS — Z79899 Other long term (current) drug therapy: Secondary | ICD-10-CM

## 2023-07-09 DIAGNOSIS — C9110 Chronic lymphocytic leukemia of B-cell type not having achieved remission: Secondary | ICD-10-CM

## 2023-07-10 ENCOUNTER — Inpatient Hospital Stay: Admitting: Oncology

## 2023-07-10 ENCOUNTER — Other Ambulatory Visit: Payer: Self-pay

## 2023-07-10 DIAGNOSIS — E1121 Type 2 diabetes mellitus with diabetic nephropathy: Secondary | ICD-10-CM | POA: Diagnosis not present

## 2023-07-10 DIAGNOSIS — L03116 Cellulitis of left lower limb: Secondary | ICD-10-CM | POA: Diagnosis not present

## 2023-07-10 DIAGNOSIS — E860 Dehydration: Secondary | ICD-10-CM | POA: Diagnosis not present

## 2023-07-10 DIAGNOSIS — L03115 Cellulitis of right lower limb: Secondary | ICD-10-CM | POA: Diagnosis not present

## 2023-07-10 DIAGNOSIS — C9111 Chronic lymphocytic leukemia of B-cell type in remission: Secondary | ICD-10-CM | POA: Diagnosis not present

## 2023-07-10 DIAGNOSIS — L304 Erythema intertrigo: Secondary | ICD-10-CM | POA: Diagnosis not present

## 2023-07-10 DIAGNOSIS — Z5112 Encounter for antineoplastic immunotherapy: Secondary | ICD-10-CM | POA: Diagnosis not present

## 2023-07-10 DIAGNOSIS — C787 Secondary malignant neoplasm of liver and intrahepatic bile duct: Secondary | ICD-10-CM | POA: Diagnosis not present

## 2023-07-10 DIAGNOSIS — Z905 Acquired absence of kidney: Secondary | ICD-10-CM | POA: Diagnosis not present

## 2023-07-10 DIAGNOSIS — C641 Malignant neoplasm of right kidney, except renal pelvis: Secondary | ICD-10-CM | POA: Diagnosis not present

## 2023-07-10 DIAGNOSIS — C7800 Secondary malignant neoplasm of unspecified lung: Secondary | ICD-10-CM | POA: Diagnosis not present

## 2023-07-10 DIAGNOSIS — C9110 Chronic lymphocytic leukemia of B-cell type not having achieved remission: Secondary | ICD-10-CM | POA: Diagnosis not present

## 2023-07-10 DIAGNOSIS — E039 Hypothyroidism, unspecified: Secondary | ICD-10-CM | POA: Diagnosis not present

## 2023-07-10 DIAGNOSIS — Z79899 Other long term (current) drug therapy: Secondary | ICD-10-CM

## 2023-07-10 DIAGNOSIS — Z86718 Personal history of other venous thrombosis and embolism: Secondary | ICD-10-CM | POA: Diagnosis not present

## 2023-07-10 DIAGNOSIS — E11649 Type 2 diabetes mellitus with hypoglycemia without coma: Secondary | ICD-10-CM | POA: Diagnosis not present

## 2023-07-10 DIAGNOSIS — N179 Acute kidney failure, unspecified: Secondary | ICD-10-CM | POA: Diagnosis not present

## 2023-07-10 DIAGNOSIS — F17210 Nicotine dependence, cigarettes, uncomplicated: Secondary | ICD-10-CM | POA: Diagnosis not present

## 2023-07-10 DIAGNOSIS — Z7984 Long term (current) use of oral hypoglycemic drugs: Secondary | ICD-10-CM | POA: Diagnosis not present

## 2023-07-10 MED ORDER — FUROSEMIDE 20 MG PO TABS: 40.0000 mg | ORAL_TABLET | Freq: Every day | ORAL | 0 refills | Status: DC

## 2023-07-10 NOTE — Progress Notes (Signed)
 Hematology progress note  Patient is here today for EKG prior to the start of Lenvima.  I reviewed the EKG myself.  QTc: 470.  Normal sinus rhythm with a right bundle branch block  -Will start Lenvima at this time at 10mg  daily  -Will repeat an EKG at next visit on 07/27/2023 -Will hold Lenvima or decrease dose with QTc greater than 500.  Or can consider changing escitalopram  to another agent  The total time spent in the appointment was 15 minutes  including review of chart and EKG, and coordination of care plan  Eduardo Grade, MD Hematology/Oncology [facility]

## 2023-07-12 ENCOUNTER — Encounter: Payer: Self-pay | Admitting: Oncology

## 2023-07-13 ENCOUNTER — Ambulatory Visit (HOSPITAL_COMMUNITY)
Admission: RE | Admit: 2023-07-13 | Discharge: 2023-07-13 | Disposition: A | Discharge status: Home / Self Care | Source: Ambulatory Visit | Attending: Oncology | Admitting: Oncology

## 2023-07-13 DIAGNOSIS — L03115 Cellulitis of right lower limb: Secondary | ICD-10-CM | POA: Diagnosis not present

## 2023-07-13 DIAGNOSIS — K769 Liver disease, unspecified: Secondary | ICD-10-CM | POA: Diagnosis not present

## 2023-07-13 DIAGNOSIS — D696 Thrombocytopenia, unspecified: Secondary | ICD-10-CM | POA: Diagnosis not present

## 2023-07-13 DIAGNOSIS — Z7901 Long term (current) use of anticoagulants: Secondary | ICD-10-CM | POA: Diagnosis not present

## 2023-07-13 DIAGNOSIS — I878 Other specified disorders of veins: Secondary | ICD-10-CM | POA: Diagnosis not present

## 2023-07-13 DIAGNOSIS — Z79899 Other long term (current) drug therapy: Secondary | ICD-10-CM | POA: Diagnosis not present

## 2023-07-13 DIAGNOSIS — E1169 Type 2 diabetes mellitus with other specified complication: Secondary | ICD-10-CM | POA: Diagnosis not present

## 2023-07-13 DIAGNOSIS — C799 Secondary malignant neoplasm of unspecified site: Secondary | ICD-10-CM | POA: Diagnosis not present

## 2023-07-13 DIAGNOSIS — C9110 Chronic lymphocytic leukemia of B-cell type not having achieved remission: Secondary | ICD-10-CM | POA: Diagnosis not present

## 2023-07-13 DIAGNOSIS — R809 Proteinuria, unspecified: Secondary | ICD-10-CM | POA: Diagnosis not present

## 2023-07-13 DIAGNOSIS — E1121 Type 2 diabetes mellitus with diabetic nephropathy: Secondary | ICD-10-CM | POA: Diagnosis not present

## 2023-07-13 DIAGNOSIS — L03116 Cellulitis of left lower limb: Secondary | ICD-10-CM | POA: Diagnosis not present

## 2023-07-13 DIAGNOSIS — I152 Hypertension secondary to endocrine disorders: Secondary | ICD-10-CM | POA: Diagnosis not present

## 2023-07-13 DIAGNOSIS — C641 Malignant neoplasm of right kidney, except renal pelvis: Secondary | ICD-10-CM | POA: Insufficient documentation

## 2023-07-13 DIAGNOSIS — E1165 Type 2 diabetes mellitus with hyperglycemia: Secondary | ICD-10-CM | POA: Diagnosis not present

## 2023-07-13 DIAGNOSIS — Z7984 Long term (current) use of oral hypoglycemic drugs: Secondary | ICD-10-CM | POA: Diagnosis not present

## 2023-07-13 DIAGNOSIS — R590 Localized enlarged lymph nodes: Secondary | ICD-10-CM | POA: Diagnosis not present

## 2023-07-13 DIAGNOSIS — E039 Hypothyroidism, unspecified: Secondary | ICD-10-CM | POA: Diagnosis not present

## 2023-07-13 DIAGNOSIS — R7989 Other specified abnormal findings of blood chemistry: Secondary | ICD-10-CM | POA: Diagnosis not present

## 2023-07-13 DIAGNOSIS — Z905 Acquired absence of kidney: Secondary | ICD-10-CM | POA: Diagnosis not present

## 2023-07-13 DIAGNOSIS — F17210 Nicotine dependence, cigarettes, uncomplicated: Secondary | ICD-10-CM | POA: Diagnosis not present

## 2023-07-13 DIAGNOSIS — E1159 Type 2 diabetes mellitus with other circulatory complications: Secondary | ICD-10-CM | POA: Diagnosis not present

## 2023-07-13 DIAGNOSIS — E785 Hyperlipidemia, unspecified: Secondary | ICD-10-CM | POA: Diagnosis not present

## 2023-07-13 LAB — FLOW CYTOMETRY

## 2023-07-13 MED ORDER — IOHEXOL 300 MG/ML  SOLN
100.0000 mL | Freq: Once | INTRAMUSCULAR | Status: AC | PRN
  Administered 2023-07-13: 75 mL via INTRAVENOUS

## 2023-07-15 ENCOUNTER — Encounter (HOSPITAL_COMMUNITY): Payer: Self-pay

## 2023-07-15 ENCOUNTER — Inpatient Hospital Stay (HOSPITAL_BASED_OUTPATIENT_CLINIC_OR_DEPARTMENT_OTHER): Admitting: Oncology

## 2023-07-15 ENCOUNTER — Other Ambulatory Visit: Payer: Self-pay

## 2023-07-15 ENCOUNTER — Inpatient Hospital Stay (HOSPITAL_COMMUNITY)
Admission: RE | Admit: 2023-07-15 | Discharge: 2023-07-17 | DRG: 603 | Disposition: A | Discharge status: Home / Self Care | Source: Ambulatory Visit | Attending: Internal Medicine | Admitting: Internal Medicine

## 2023-07-15 ENCOUNTER — Inpatient Hospital Stay (HOSPITAL_COMMUNITY)

## 2023-07-15 VITALS — BP 136/79 | HR 80 | Temp 98.2°F | Resp 18 | Wt 217.4 lb

## 2023-07-15 DIAGNOSIS — L03119 Cellulitis of unspecified part of limb: Secondary | ICD-10-CM

## 2023-07-15 DIAGNOSIS — L03116 Cellulitis of left lower limb: Secondary | ICD-10-CM | POA: Diagnosis present

## 2023-07-15 DIAGNOSIS — C641 Malignant neoplasm of right kidney, except renal pelvis: Secondary | ICD-10-CM

## 2023-07-15 DIAGNOSIS — Z813 Family history of other psychoactive substance abuse and dependence: Secondary | ICD-10-CM

## 2023-07-15 DIAGNOSIS — F17210 Nicotine dependence, cigarettes, uncomplicated: Secondary | ICD-10-CM | POA: Diagnosis present

## 2023-07-15 DIAGNOSIS — E039 Hypothyroidism, unspecified: Secondary | ICD-10-CM | POA: Diagnosis not present

## 2023-07-15 DIAGNOSIS — I152 Hypertension secondary to endocrine disorders: Secondary | ICD-10-CM | POA: Diagnosis present

## 2023-07-15 DIAGNOSIS — D696 Thrombocytopenia, unspecified: Secondary | ICD-10-CM | POA: Diagnosis present

## 2023-07-15 DIAGNOSIS — F411 Generalized anxiety disorder: Secondary | ICD-10-CM | POA: Diagnosis present

## 2023-07-15 DIAGNOSIS — E1159 Type 2 diabetes mellitus with other circulatory complications: Secondary | ICD-10-CM | POA: Diagnosis present

## 2023-07-15 DIAGNOSIS — Z825 Family history of asthma and other chronic lower respiratory diseases: Secondary | ICD-10-CM

## 2023-07-15 DIAGNOSIS — L03115 Cellulitis of right lower limb: Principal | ICD-10-CM | POA: Diagnosis present

## 2023-07-15 DIAGNOSIS — M7989 Other specified soft tissue disorders: Secondary | ICD-10-CM | POA: Diagnosis not present

## 2023-07-15 DIAGNOSIS — Z7984 Long term (current) use of oral hypoglycemic drugs: Secondary | ICD-10-CM

## 2023-07-15 DIAGNOSIS — E1169 Type 2 diabetes mellitus with other specified complication: Secondary | ICD-10-CM | POA: Diagnosis present

## 2023-07-15 DIAGNOSIS — I517 Cardiomegaly: Secondary | ICD-10-CM | POA: Diagnosis not present

## 2023-07-15 DIAGNOSIS — E1121 Type 2 diabetes mellitus with diabetic nephropathy: Secondary | ICD-10-CM | POA: Diagnosis present

## 2023-07-15 DIAGNOSIS — R809 Proteinuria, unspecified: Secondary | ICD-10-CM | POA: Diagnosis present

## 2023-07-15 DIAGNOSIS — L304 Erythema intertrigo: Secondary | ICD-10-CM | POA: Diagnosis not present

## 2023-07-15 DIAGNOSIS — I878 Other specified disorders of veins: Secondary | ICD-10-CM | POA: Diagnosis present

## 2023-07-15 DIAGNOSIS — Z7901 Long term (current) use of anticoagulants: Secondary | ICD-10-CM

## 2023-07-15 DIAGNOSIS — Z79899 Other long term (current) drug therapy: Secondary | ICD-10-CM

## 2023-07-15 DIAGNOSIS — Z86718 Personal history of other venous thrombosis and embolism: Secondary | ICD-10-CM

## 2023-07-15 DIAGNOSIS — Z85528 Personal history of other malignant neoplasm of kidney: Secondary | ICD-10-CM

## 2023-07-15 DIAGNOSIS — E785 Hyperlipidemia, unspecified: Secondary | ICD-10-CM | POA: Diagnosis present

## 2023-07-15 DIAGNOSIS — E66812 Obesity, class 2: Secondary | ICD-10-CM | POA: Diagnosis present

## 2023-07-15 DIAGNOSIS — R062 Wheezing: Secondary | ICD-10-CM | POA: Diagnosis not present

## 2023-07-15 DIAGNOSIS — C799 Secondary malignant neoplasm of unspecified site: Secondary | ICD-10-CM | POA: Diagnosis present

## 2023-07-15 DIAGNOSIS — C9110 Chronic lymphocytic leukemia of B-cell type not having achieved remission: Secondary | ICD-10-CM | POA: Diagnosis not present

## 2023-07-15 DIAGNOSIS — L0390 Cellulitis, unspecified: Principal | ICD-10-CM | POA: Diagnosis present

## 2023-07-15 DIAGNOSIS — Z79890 Hormone replacement therapy (postmenopausal): Secondary | ICD-10-CM | POA: Diagnosis not present

## 2023-07-15 DIAGNOSIS — S81802A Unspecified open wound, left lower leg, initial encounter: Secondary | ICD-10-CM | POA: Diagnosis present

## 2023-07-15 DIAGNOSIS — E1165 Type 2 diabetes mellitus with hyperglycemia: Secondary | ICD-10-CM | POA: Diagnosis present

## 2023-07-15 DIAGNOSIS — Z6837 Body mass index (BMI) 37.0-37.9, adult: Secondary | ICD-10-CM

## 2023-07-15 DIAGNOSIS — R7989 Other specified abnormal findings of blood chemistry: Secondary | ICD-10-CM | POA: Diagnosis present

## 2023-07-15 DIAGNOSIS — Z720 Tobacco use: Secondary | ICD-10-CM

## 2023-07-15 DIAGNOSIS — Z905 Acquired absence of kidney: Secondary | ICD-10-CM

## 2023-07-15 LAB — COMPREHENSIVE METABOLIC PANEL WITH GFR
ALT: 17 U/L (ref 0–44)
AST: 20 U/L (ref 15–41)
Albumin: 3.7 g/dL (ref 3.5–5.0)
Alkaline Phosphatase: 83 U/L (ref 38–126)
Anion gap: 12 (ref 5–15)
BUN: 25 mg/dL — ABNORMAL HIGH (ref 8–23)
CO2: 29 mmol/L (ref 22–32)
Calcium: 8.5 mg/dL — ABNORMAL LOW (ref 8.9–10.3)
Chloride: 95 mmol/L — ABNORMAL LOW (ref 98–111)
Creatinine, Ser: 1.65 mg/dL — ABNORMAL HIGH (ref 0.61–1.24)
GFR, Estimated: 44 mL/min — ABNORMAL LOW (ref >60)
Glucose, Bld: 208 mg/dL — ABNORMAL HIGH (ref 70–99)
Potassium: 4.6 mmol/L (ref 3.5–5.1)
Sodium: 136 mmol/L (ref 135–145)
Total Bilirubin: 0.6 mg/dL (ref 0.0–1.2)
Total Protein: 6.8 g/dL (ref 6.5–8.1)

## 2023-07-15 LAB — MRSA NEXT GEN BY PCR, NASAL: MRSA by PCR Next Gen: NOT DETECTED

## 2023-07-15 LAB — CBC
HCT: 40.8 % (ref 39.0–52.0)
Hemoglobin: 12.7 g/dL — ABNORMAL LOW (ref 13.0–17.0)
MCH: 31 pg (ref 26.0–34.0)
MCHC: 31.1 g/dL (ref 30.0–36.0)
MCV: 99.5 fL (ref 80.0–100.0)
Platelets: 142 10*3/uL — ABNORMAL LOW (ref 150–400)
RBC: 4.1 MIL/uL — ABNORMAL LOW (ref 4.22–5.81)
RDW: 15 % (ref 11.5–15.5)
WBC: 26.7 10*3/uL — ABNORMAL HIGH (ref 4.0–10.5)
nRBC: 0 % (ref 0.0–0.2)

## 2023-07-15 LAB — GLUCOSE, CAPILLARY
Glucose-Capillary: 212 mg/dL — ABNORMAL HIGH (ref 70–99)
Glucose-Capillary: 139 mg/dL — ABNORMAL HIGH (ref 70–99)

## 2023-07-15 LAB — LACTIC ACID, PLASMA: Lactic Acid, Venous: 1 mmol/L (ref 0.5–1.9)

## 2023-07-15 LAB — MAGNESIUM: Magnesium: 1.8 mg/dL (ref 1.7–2.4)

## 2023-07-15 MED ORDER — FUROSEMIDE 40 MG PO TABS
40.0000 mg | ORAL_TABLET | Freq: Every day | ORAL | Status: DC
  Administered 2023-07-15 – 2023-07-16 (×2): 40 mg via ORAL
  Filled 2023-07-15: qty 1
  Filled 2023-07-15: qty 2
  Filled 2023-07-15: qty 1

## 2023-07-15 MED ORDER — INSULIN ASPART 100 UNIT/ML IJ SOLN
0.0000 [IU] | Freq: Three times a day (TID) | INTRAMUSCULAR | Status: DC
  Administered 2023-07-16 – 2023-07-17 (×4): 1 [IU] via SUBCUTANEOUS

## 2023-07-15 MED ORDER — INSULIN ASPART 100 UNIT/ML IJ SOLN: 0.0000 [IU] | Freq: Every day | INTRAMUSCULAR | Status: DC

## 2023-07-15 MED ORDER — APIXABAN 5 MG PO TABS
5.0000 mg | ORAL_TABLET | Freq: Two times a day (BID) | ORAL | Status: DC
  Administered 2023-07-15 – 2023-07-17 (×5): 5 mg via ORAL
  Filled 2023-07-15 (×5): qty 1

## 2023-07-15 MED ORDER — ESCITALOPRAM OXALATE 10 MG PO TABS
10.0000 mg | ORAL_TABLET | Freq: Every day | ORAL | Status: DC
  Administered 2023-07-15 – 2023-07-16 (×2): 10 mg via ORAL
  Filled 2023-07-15 (×2): qty 1

## 2023-07-15 MED ORDER — LISINOPRIL 10 MG PO TABS
10.0000 mg | ORAL_TABLET | Freq: Every day | ORAL | Status: DC
  Administered 2023-07-15: 10 mg via ORAL
  Filled 2023-07-15 (×2): qty 1

## 2023-07-15 MED ORDER — LENVATINIB (10 MG DAILY DOSE) 10 MG PO CPPK
10.0000 mg | ORAL_CAPSULE | ORAL | Status: DC
  Administered 2023-07-16 – 2023-07-17 (×2): 10 mg via ORAL

## 2023-07-15 MED ORDER — CHLORHEXIDINE GLUCONATE CLOTH 2 % EX PADS
6.0000 | MEDICATED_PAD | Freq: Every day | CUTANEOUS | Status: DC
  Administered 2023-07-15 – 2023-07-16 (×2): 6 via TOPICAL

## 2023-07-15 MED ORDER — LEVOTHYROXINE SODIUM 50 MCG PO TABS
50.0000 ug | ORAL_TABLET | Freq: Every day | ORAL | Status: DC
  Administered 2023-07-16 – 2023-07-17 (×2): 50 ug via ORAL
  Filled 2023-07-15: qty 2
  Filled 2023-07-15: qty 1

## 2023-07-15 MED ORDER — SODIUM CHLORIDE 0.9 % IV SOLN
2.0000 g | INTRAVENOUS | Status: DC
  Administered 2023-07-15 – 2023-07-17 (×3): 2 g via INTRAVENOUS
  Filled 2023-07-15 (×3): qty 20

## 2023-07-15 MED ORDER — ONDANSETRON HCL 4 MG PO TABS: 4.0000 mg | ORAL_TABLET | Freq: Four times a day (QID) | ORAL | Status: DC | PRN

## 2023-07-15 MED ORDER — ORAL CARE MOUTH RINSE: 15.0000 mL | OROMUCOSAL | Status: DC | PRN

## 2023-07-15 MED ORDER — LENVATINIB (10 MG DAILY DOSE) 10 MG PO CPPK: 10.0000 mg | ORAL_CAPSULE | Freq: Every day | ORAL | Status: DC

## 2023-07-15 MED ORDER — ACETAMINOPHEN 325 MG PO TABS
650.0000 mg | ORAL_TABLET | Freq: Four times a day (QID) | ORAL | Status: DC | PRN
  Administered 2023-07-15 – 2023-07-16 (×2): 650 mg via ORAL
  Filled 2023-07-15 (×2): qty 2

## 2023-07-15 MED ORDER — ONDANSETRON HCL 4 MG/2ML IJ SOLN: 4.0000 mg | Freq: Four times a day (QID) | INTRAMUSCULAR | Status: DC | PRN

## 2023-07-15 MED ORDER — HYDROMORPHONE HCL 1 MG/ML IJ SOLN
0.5000 mg | INTRAMUSCULAR | Status: DC | PRN
  Administered 2023-07-16 – 2023-07-17 (×2): 0.5 mg via INTRAVENOUS
  Filled 2023-07-15 (×2): qty 0.5

## 2023-07-15 MED ORDER — ATORVASTATIN CALCIUM 40 MG PO TABS
40.0000 mg | ORAL_TABLET | Freq: Every day | ORAL | Status: DC
  Administered 2023-07-15 – 2023-07-16 (×2): 40 mg via ORAL
  Filled 2023-07-15 (×3): qty 1

## 2023-07-15 NOTE — Consult Note (Addendum)
 WOC Nurse Consult Note: Reason for Consult: Consult requested for bilat leg cellulitis.  Performed remotely after review of progress notes and photos in the EMR.  Pt has generalized edema and erythremia and scattered areas of red moist full thickness wounds.  He is currently on systemic antibiotic coverage for cellulitis.   Dressing procedure/placement/frequency: Topical treatment orders provided for bedside nurses to perform as follows to absorb drainage and provide antimicrobial benefits: After ultrasound is performed:  Apply Xerform gauze over bilat legs and cover with ABD pads and kerlex Q day.    Please re-consult if further assistance is needed.  Thank-you,  Stephane Fought MSN, RN, CWOCN, [place], CNS [phone]

## 2023-07-15 NOTE — Assessment & Plan Note (Signed)
 Continued tobacco use, a risk factor for kidney cancer.  Advised to quit smoking to reduce health risks.  - Provide resources and support for smoking cessation.

## 2023-07-15 NOTE — Assessment & Plan Note (Signed)
 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 Significantly improved at this time Completed oral fluconazole  course  - Discontinued axitinib  - Continue to use terbinafine  cream twice daily in the affected area - Instructed to clean affected area with soap and water , pat dry, apply cream, and then apply powder for drying.

## 2023-07-15 NOTE — Plan of Care (Signed)
  Problem: Pain Managment: Goal: General experience of comfort will improve and/or be controlled Outcome: Progressing

## 2023-07-15 NOTE — Progress Notes (Signed)
 Patient Care Team: Severa Rock HERO, FNP as PCP - General (Family Medicine) Cindie Carlin POUR, DO as Consulting Physician (Internal Medicine) Ladora Ross Lacy Phebe, MD as Referring Physician (Optometry) Greissinger, April Macario, [place] (Hematology and Oncology)  Clinic Day:  07/15/2023  Referring physician: Severa Rock HERO, FNP   CHIEF COMPLAINT:  CC: Renal cell carcinoma    ASSESSMENT & PLAN:   Assessment & Plan: George Anthony  is a 73 y.o. male with metastatic right-sided renal cell carcinoma on pembrolizumab  and axitinib     Renal cell carcinoma of right kidney [facility]) Patient has metastatic renal cell carcinoma with biopsy-proven liver metastasis and lung metastasis. Oncology history as below Started on pembrolizumab  every 6 weeks and axitinib  twice daily in May 2024.  Axitinib  was started later in July 2024 due to problems with access Last imaging was in March 2025 with no evidence of progression Held Axitinib  for malignant intertrigo Recent CT with stable disease  - Continue pembrolizumab  infusions every 6 weeks.  Started on lenvatinib  10 mg daily last week.  Will increase the dose if patient tolerates well.  Return to clinic after discharge from the hospital for follow-up  CLL (chronic lymphocytic leukemia) (HCC) Patient had incidental diagnosis of CLL.  RAI stage 0. Asymptomatic. More than 50% increase in lymphocytes at this time   - If lymphocytes continue to trend up and patient develops symptoms, can consider starting on treatment.  Intertrigo Patient likely has malignant intertrigo secondary to axitinib  use. Significantly improved at this time Completed oral fluconazole  course  - Discontinued axitinib  - Continue to use terbinafine  cream twice daily in the affected area - Instructed to clean affected area with soap and water , pat dry, apply cream, and then apply powder for drying.  Hypothyroidism Likely secondary to immunotherapy TSH: 108  - Continue  levothyroxine  50 mcg daily - Will monitor levels every 6 weeks  Tobacco use Continued tobacco use, a risk factor for kidney cancer.  Advised to quit smoking to reduce health risks.  - Provide resources and support for smoking cessation.  Cellulitis Patient has bilateral leg cellulitis with significant edema Unlikely drug side effect considering he has been off of axitinib  for a while  - Will admit patient to the hospital for IV antibiotics - Will reach out to hospitalist for admission     The patient understands the plans discussed today and is in agreement with them.  He knows to contact our office if he develops concerns prior to his next appointment.  I provided 30 minutes of face-to-face time during this encounter and > 50% was spent counseling as documented under my assessment and plan.    Mickiel Dry, MD  [facility] [place] - A DEPT OF [facility] [address] [place] [address] Dept: [phone] Dept Fax: [phone]   No orders of the defined types were placed in this encounter.    ONCOLOGY HISTORY:   Oncology History  Renal cell carcinoma of right kidney (HCC)  08/23/2020 Initial Diagnosis   Renal cell carcinoma, right (HCC)   05/26/2022 Cancer Staging   Staging form: Kidney, AJCC 8th Edition - Clinical stage from 05/26/2022: Stage IV (cM1) - Signed by Dry Mickiel, MD on 06/05/2023 Stage prefix: Recurrence   07/03/2023 -  Chemotherapy   Patient is on Treatment Plan : RENAL CELL Pembrolizumab  (400) + Axitinib  q42d     07/13/2023 Imaging   CT CAP W contrast:  IMPRESSION: CHEST:  1. Rounded nodule in the medial aspect of the RIGHT lower lobe is concerning for renal cell carcinoma metastasis. 2. No thoracic adenopathy.   PELVIS:   1. Post RIGHT nephrectomy. No evidence of local recurrence. 2. Two low-density lesions in the RIGHT hepatic lobe are indeterminate. Consider MRI of the liver  evaluate for metastasis. 3. Mildly enlarged external iliac lymph nodes are NOT favored renal cell carcinoma metastasis 4. Stable round enlargement of the LEFT adrenal gland is stable. 5. No skeletal metastasis   CLL (chronic lymphocytic leukemia) (HCC)  04/21/2022 Cancer Staging   Staging form: Chronic Lymphocytic Leukemia / Small Lymphocytic Lymphoma, AJCC 8th Edition - Clinical stage from 04/21/2022: Modified Rai Stage 0 (Modified Rai risk: Low, Lymphocytosis: Present, Adenopathy: Unknown, Organomegaly: Absent, Anemia: Absent, Thrombocytopenia: Absent) - Signed by [name], MD on 06/05/2023 Stage prefix: Initial diagnosis Hemoglobin (Hgb) (g/dL): 16 Platelet count (k89Z0/O of blood): 156   06/05/2023 Initial Diagnosis   CLL (chronic lymphocytic leukemia) (HCC)   -08/14/2020: 9.1 cm renal mass with IVC thrombus to the level - 09/04/2020: Surgery: Open right nephrectomy with IVC thrombectomy  - Clear-cell RCC, grade 4, invades IVC involved - Sarcomatoid features present - pT3c N0/11 R0 -01/09/2023: CT CAP: Lungs: 6 mm right apical pulmonary nodule that was not seen prior.  Additional subcentimeter pulmonary nodules.  Liver: Overall small size and distribution of low-attenuation lesions.  The lesion in the right hepatic dome and measures approximately 1.8 into 3.5 cm, no new suspicious focal findings.  Kidneys: Right nephrectomy with similar soft tissue nodules in the resection bed, with an index nodule measuring 1.4 and 1 cm.  No left hydronephrosis.  Similar exophytic left renal cyst -04/10/2022: CT CAP: Findings concerning for progressive metastatic disease including enlarging hepatic metastasis, increased nodularity within the right nephrectomy bed, enlarging pulmonary nodules, and worsening multistation lymphadenopathy.  Interval development of a wedge-shaped focus of hypoattenuation in the superior spleen, favored to represent interval infarct.  Similar 3 cm fusiform infrarenal abdominal  aortic aneurysm -04/23/2022: Ultrasound liver biopsy revealed SLL/CLL on core biopsy with atypical lymphoid cells on liver FNA (CLL component is likely blood contaminant) -05/26/2022: Repeat biopsy of liver confirmed RCC -06/17/2022: Pembrolizumab  every 42 days -07/2022: Started axitinib  later than pembrolizumab  due to delay in access.  He is currently on 3 mg twice a day and did not tolerate 6 mg twice a day with nausea and loss of appetite -06/11/2023: Held Axitinib  for malignant intertrigo   Current Treatment: Pembrolizumab  plus axitinib      INTERVAL HISTORY: George Anthony is here today for follow-up.  He presented to the clinic with his wife.  He reported swelling and weeping of his bilateral legs for almost 1 and half weeks at this time.  He has no fever, chills.  She reports some pain in bilateral legs and unable to put on socks.  Discussed that the legs appear like cellulitis and would require IV antibiotics.  Will reach out to the hospitalist team for direct admission.   I have reviewed the past medical history, past surgical history, social history and family history with the patient and they are unchanged from previous note.  ALLERGIES:  has no known allergies.  MEDICATIONS:  No current facility-administered medications for this visit.   No current outpatient medications on file.   Facility-Administered Medications Ordered in Other Visits  Medication Dose Route Frequency Provider Last Rate Last Admin   acetaminophen  (TYLENOL ) tablet 650 mg  650 mg Oral Q6H PRN Maree, Pratik D, DO  apixaban  (ELIQUIS ) tablet 5 mg  5 mg Oral BID Maree, Pratik D, DO   5 mg at 07/15/23 1349   atorvastatin  (LIPITOR) tablet 40 mg  40 mg Oral QHS Shah, Pratik D, DO       cefTRIAXone  (ROCEPHIN ) 2 g in sodium chloride  0.9 % 100 mL IVPB  2 g Intravenous Q24H Maree, Pratik D, DO 200 mL/hr at 07/15/23 1300 Infusion Verify at 07/15/23 1300   Chlorhexidine  Gluconate Cloth 2 % PADS 6 each  6 each  Topical Daily Maree Bracken D, DO   6 each at 07/15/23 1130   escitalopram  (LEXAPRO ) tablet 10 mg  10 mg Oral QHS Shah, Pratik D, DO       furosemide  (LASIX ) tablet 40 mg  40 mg Oral Daily Maree, Pratik D, DO   40 mg at 07/15/23 1349   HYDROmorphone (DILAUDID) injection 0.5-1 mg  0.5-1 mg Intravenous Q2H PRN Maree, Pratik D, DO       lenvatinib  10 mg daily dose (LENVIMA ) capsule 10 mg  10 mg Oral Daily Maree, Pratik D, DO       [START ON 07/16/2023] levothyroxine  (SYNTHROID ) tablet 50 mcg  50 mcg Oral QAC breakfast Maree, Pratik D, DO       lisinopril  (ZESTRIL ) tablet 10 mg  10 mg Oral Daily Maree, Pratik D, DO   10 mg at 07/15/23 1349   ondansetron  (ZOFRAN ) tablet 4 mg  4 mg Oral Q6H PRN Maree, Pratik D, DO       Or   ondansetron  (ZOFRAN ) injection 4 mg  4 mg Intravenous Q6H PRN Maree, Pratik D, DO       Oral care mouth rinse  15 mL Mouth Rinse PRN Maree, Pratik D, DO        REVIEW OF SYSTEMS:   Constitutional: Denies fevers, chills or abnormal weight loss Eyes: Denies blurriness of vision Ears, nose, mouth, throat, and face: Denies mucositis or sore throat Respiratory: Denies cough, dyspnea or wheezes Cardiovascular: Denies palpitation, chest discomfort or lower extremity swelling Gastrointestinal:  Denies nausea, heartburn or change in bowel habits Skin: Denies abnormal skin rashes Lymphatics: Denies new lymphadenopathy or easy bruising Neurological:Denies numbness, tingling or new weaknesses Behavioral/Psych: Mood is stable, no new changes  All other systems were reviewed with the patient and are negative.   VITALS:   Blood pressure 136/79, pulse 80, temperature 98.2 F (36.8 C), temperature source Oral, resp. rate 18, weight 217 lb 6 oz (98.6 kg), SpO2 99%.  Wt Readings from Last 3 Encounters:  07/15/23 213 lb 10 oz (96.9 kg)  07/15/23 217 lb 6 oz (98.6 kg)  06/22/23 222 lb 12.8 oz (101.1 kg)    Body mass index is 38.04 kg/m.  Performance status (ECOG): 1 - Symptomatic but  completely ambulatory  PHYSICAL EXAM:   GENERAL:alert, no distress and comfortable SKIN: Bilateral leg swelling with significant erythema, some areas of excoriation and bleeding mostly near the contact with shoe.  Previous intertrigo mostly resolved. OROPHARYNX:no exudate, no erythema and lips, buccal mucosa, and tongue normal  LUNGS: clear to auscultation and percussion with normal breathing effort HEART: regular rate & rhythm and no murmurs and no lower extremity edema ABDOMEN:abdomen soft, non-tender and normal bowel sounds Musculoskeletal:no cyanosis of digits and no clubbing  NEURO: alert & oriented x 3 with fluent speech    LABORATORY DATA:  I have reviewed the data as listed    Component Value Date/Time   NA 136 07/15/2023 1222   NA 135 10/16/2022 1220  K 4.6 07/15/2023 1222   CL 95 (L) 07/15/2023 1222   CO2 29 07/15/2023 1222   GLUCOSE 208 (H) 07/15/2023 1222   BUN 25 (H) 07/15/2023 1222   BUN 12 10/16/2022 1220   CREATININE 1.65 (H) 07/15/2023 1222   CALCIUM  8.5 (L) 07/15/2023 1222   PROT 6.8 07/15/2023 1222   PROT 6.3 10/16/2022 1220   ALBUMIN 3.7 07/15/2023 1222   ALBUMIN 3.9 10/16/2022 1220   AST 20 07/15/2023 1222   ALT 17 07/15/2023 1222   ALKPHOS 83 07/15/2023 1222   BILITOT 0.6 07/15/2023 1222   BILITOT 0.5 10/16/2022 1220   GFRNONAA 44 (L) 07/15/2023 1222   GFRAA 123 08/17/2019 1636    Lab Results  Component Value Date   WBC 26.7 (H) 07/15/2023   NEUTROABS 8.1 (H) 07/03/2023   HGB 12.7 (L) 07/15/2023   HCT 40.8 07/15/2023   MCV 99.5 07/15/2023   PLT 142 (L) 07/15/2023      Chemistry      Component Value Date/Time   NA 136 07/15/2023 1222   NA 135 10/16/2022 1220   K 4.6 07/15/2023 1222   CL 95 (L) 07/15/2023 1222   CO2 29 07/15/2023 1222   BUN 25 (H) 07/15/2023 1222   BUN 12 10/16/2022 1220   CREATININE 1.65 (H) 07/15/2023 1222      Component Value Date/Time   CALCIUM  8.5 (L) 07/15/2023 1222   ALKPHOS 83 07/15/2023 1222   AST 20  07/15/2023 1222   ALT 17 07/15/2023 1222   BILITOT 0.6 07/15/2023 1222   BILITOT 0.5 10/16/2022 1220      Latest Reference Range & Units 07/03/23 09:30  TSH 0.350 - 4.500 uIU/mL 102.382 (H)  (H): Data is abnormally high  RADIOGRAPHIC STUDIES: I have personally reviewed the radiological images as listed and agreed with the findings in the report.  CT CHEST ABDOMEN PELVIS W CONTRAST Result Date: 07/13/2023 CLINICAL DATA:  Renal cell carcinoma. RIGHT renal. * Tracking Code: BO * EXAM: CT CHEST, ABDOMEN, AND PELVIS WITH CONTRAST TECHNIQUE: Multidetector CT imaging of the chest, abdomen and pelvis was performed following the standard protocol during bolus administration of intravenous contrast. RADIATION DOSE REDUCTION: This exam was performed according to the departmental dose-optimization program which includes automated exposure control, adjustment of the mA and/or kV according to patient size and/or use of iterative reconstruction technique. CONTRAST:  75mL OMNIPAQUE  IOHEXOL  300 MG/ML  SOLN COMPARISON:  CT chest 09/27/2022, CT chest abdomen pelvis 08/09/2020 FINDINGS: CT CHEST FINDINGS Cardiovascular: Post CABG.  No acute cardiac findings Mediastinum/Nodes: No axillary or supraclavicular adenopathy. No mediastinal or hilar adenopathy. No pericardial fluid. Esophagus normal. Lungs/Pleura: Rounded nodule in the medial aspect of the RIGHT lower lobe along the pleural surface measuring 16 mm (image 102/series 3. On comparison CT scan there was dense atelectasis medially at this level. No nodule present on more remote CT scan. No additional pulmonary nodules. Musculoskeletal: No aggressive osseous lesion. CT ABDOMEN AND PELVIS FINDINGS Hepatobiliary: Linear low-density lesion in the RIGHT hepatic lobe measures 20 mm x 11 mm (image 43/2). Rounded lesion the RIGHT hepatic lobe measures 10 mm on image 50. Postcholecystectomy Pancreas: Pancreas is normal. No ductal dilatation. No pancreatic inflammation. Spleen:  Normal spleen Adrenals/urinary tract: Round enlargement of the LEFT adrenal gland to 14 mm not changed from prior (54/2). Post RIGHT nephrectomy. No nodularity in nephrectomy bed. LEFT kidney is normal. Small nonenhancing exophytic cyst from the posterior LEFT kidney measures 10 mm. LEFT ureter bladder normal. Stomach/Bowel: Stomach, small  bowel, appendix, and cecum are normal. The colon and rectosigmoid colon are normal. Vascular/Lymphatic: Abdominal aorta normal caliber. Intimal calcifications. There several small periportal lymph nodes Retroperitoneal lymph nodes are not pathologic by size criteria. Mildly enlarged external iliac lymph nodes measures 10 mm on the LEFT (image 100/2). Similar distal external iliac node on the RIGHT measuring 11 mm on image 100. Reproductive: Prostate unremarkable Other: No free fluid. Musculoskeletal: No aggressive osseous lesion. IMPRESSION: CHEST: 1. Rounded nodule in the medial aspect of the RIGHT lower lobe is concerning for renal cell carcinoma metastasis. 2. No thoracic adenopathy. PELVIS: 1. Post RIGHT nephrectomy. No evidence of local recurrence. 2. Two low-density lesions in the RIGHT hepatic lobe are indeterminate. Consider MRI of the liver evaluate for metastasis. 3. Mildly enlarged external iliac lymph nodes are are NOT favored renal cell carcinoma metastasis 4. Stable round enlargement of the LEFT adrenal gland is stable. 5. No skeletal metastasis Electronically Signed   By: Jackquline Boxer M.D.   On: 07/13/2023 09:57

## 2023-07-15 NOTE — Assessment & Plan Note (Signed)
 Likely secondary to immunotherapy TSH: 108  - Continue levothyroxine  50 mcg daily - Will monitor levels every 6 weeks

## 2023-07-15 NOTE — Assessment & Plan Note (Signed)
 Patient has bilateral leg cellulitis with significant edema Unlikely drug side effect considering he has been off of axitinib  for a while  - Will admit patient to the hospital for IV antibiotics - Will reach out to hospitalist for admission

## 2023-07-15 NOTE — Assessment & Plan Note (Signed)
 Patient had incidental diagnosis of CLL.  RAI stage 0. Asymptomatic. More than 50% increase in lymphocytes at this time   - If lymphocytes continue to trend up and patient develops symptoms, can consider starting on treatment.

## 2023-07-15 NOTE — Assessment & Plan Note (Signed)
 Patient has metastatic renal cell carcinoma with biopsy-proven liver metastasis and lung metastasis. Oncology history as below Started on pembrolizumab  every 6 weeks and axitinib  twice daily in May 2024.  Axitinib  was started later in July 2024 due to problems with access Last imaging was in March 2025 with no evidence of progression Held Axitinib  for malignant intertrigo Recent CT with stable disease  - Continue pembrolizumab  infusions every 6 weeks.  Started on lenvatinib  10 mg daily last week.  Will increase the dose if patient tolerates well.  Return to clinic after discharge from the hospital for follow-up

## 2023-07-15 NOTE — H&P (Signed)
 History and Physical    George Anthony FMW:[id number] DOB: [birth year] DOA: 07/15/2023  PCP: Severa Rock HERO, FNP   Patient coming from: Home  Chief Complaint: Bilateral leg swelling and pain  HPI: George Anthony is a 73 y.o. male with medical history significant for metastatic renal cell carcinoma, CLL, DVT, type 2 diabetes, dyslipidemia, hypertension, hypothyroidism, and anxiety disorder who is being seen by his oncologist at the cancer center this morning for follow-up regarding his CLL when he was noted to have significant swelling to his lower extremities with concern for cellulitis and was recommended for direct admission.  He states that he has had significant swelling with tenderness and pain that began approximately 3 weeks ago and has been worsening.  He has tried elevating his legs with no improvement.  He denies any fevers or chills and states that he has otherwise been taking his home medications as prescribed.  He states that he does not check his blood glucose on a regular basis.  Review of Systems: Reviewed as noted above, otherwise negative.  Past Medical History:  Diagnosis Date   Anxiety    Diabetes mellitus without complication (HCC)    Diabetic nephropathy associated with type 2 diabetes mellitus (HCC) 02/21/2021   DVT (deep venous thrombosis) (HCC)    Enlarged heart    Hyperlipidemia    Hypertension    Renal cell carcinoma [facility])     Past Surgical History:  Procedure Laterality Date   BIOPSY  09/16/2019   Procedure: BIOPSY;  Surgeon: Cindie Carlin POUR, DO;  Location: [facility];  Service: Endoscopy;;   COLONOSCOPY WITH PROPOFOL  N/A 09/16/2019   Procedure: COLONOSCOPY WITH PROPOFOL ;  Surgeon: Cindie Carlin POUR, DO;  Location: [facility];  Service: Endoscopy;  Laterality: N/A;  7:30am   POLYPECTOMY  09/16/2019   Procedure: POLYPECTOMY;  Surgeon: Cindie Carlin POUR, DO;  Location: [facility];  Service: Endoscopy;;     reports that he has been smoking  cigarettes. He has a 40 pack-year smoking history. He has never used smokeless tobacco. He reports current alcohol use of about 3.0 standard drinks of alcohol per week. He reports that he does not use drugs.  No Known Allergies  Family History  Problem Relation Age of Onset   Emphysema Mother    Drug abuse Sister    Colon cancer Neg Hx     Prior to Admission medications   Medication Sig Start Date End Date Taking? Authorizing Provider  acetaminophen  (TYLENOL ) 500 MG tablet Take 1,300 mg by mouth every 6 (six) hours as needed for moderate pain. 650 mg tab    [provider]  atorvastatin  (LIPITOR) 40 MG tablet Take 1 tablet (40 mg total) by mouth at bedtime. 01/08/23   Severa Rock HERO, FNP  benzonatate  (TESSALON  PERLES) 100 MG capsule Take 2 capsules (200 mg total) by mouth 3 (three) times daily as needed. 10/16/22   Severa Rock HERO, FNP  ELIQUIS  5 MG TABS tablet Take 1 tablet by mouth twice daily 07/01/23   Severa Rock HERO, FNP  escitalopram  (LEXAPRO ) 10 MG tablet Take 1 tablet by mouth once daily 05/14/23   Severa Rock HERO, FNP  furosemide  (LASIX ) 20 MG tablet Take 2 tablets (40 mg total) by mouth daily. 07/10/23   [name], MD  glipiZIDE  (GLUCOTROL ) 5 MG tablet Take 1 tablet (5 mg total) by mouth 2 (two) times daily before a meal. 04/09/23   Rakes, Rock HERO, FNP  glucose blood test strip Check  TID and PRN dx E11.9 02/17/19   Severa Rock HERO, FNP  lenvatinib  10 mg daily dose (LENVIMA , 10 MG DAILY DOSE,) capsule Take 1 capsule (10 mg total) by mouth daily. 07/03/23   Kandala, Hyndavi, MD  levothyroxine  (SYNTHROID ) 50 MCG tablet Take 1 tablet (50 mcg total) by mouth daily before breakfast. 07/03/23   [name], MD  lisinopril  (ZESTRIL ) 10 MG tablet Take 1 tablet (10 mg total) by mouth daily. 01/08/23   Severa Rock HERO, FNP  metFORMIN  (GLUCOPHAGE ) 1000 MG tablet TAKE 1 TABLET BY MOUTH TWICE DAILY WITH A MEAL 04/20/23   Rakes, Rock HERO, FNP  prochlorperazine (COMPAZINE) 10 MG tablet Take  10 mg by mouth every 6 (six) hours as needed. 06/28/23   [provider]  terbinafine  (LAMISIL ) 1 % cream Apply 1 Application topically 2 (two) times daily. 06/11/23   [name], MD    Physical Exam: Vitals:   07/15/23 1143 07/15/23 1200  BP: 134/83 132/78  Pulse: 78 79  Resp: 16 19  Temp: 98.2 F (36.8 C)   TempSrc: Oral   SpO2: 94% 97%  Weight: 96.9 kg   Height: 5' 3 (1.6 m)     Constitutional: NAD, calm, comfortable Vitals:   07/15/23 1143 07/15/23 1200  BP: 134/83 132/78  Pulse: 78 79  Resp: 16 19  Temp: 98.2 F (36.8 C)   TempSrc: Oral   SpO2: 94% 97%  Weight: 96.9 kg   Height: 5' 3 (1.6 m)    Eyes: lids and conjunctivae normal Neck: normal, supple Respiratory: clear to auscultation bilaterally. Normal respiratory effort. No accessory muscle use.  Cardiovascular: Regular rate and rhythm, no murmurs. Abdomen: no tenderness, no distention. Bowel sounds positive.  Musculoskeletal:  No edema. Skin:      Psychiatric: Flat affect  Labs on Admission: I have personally reviewed following labs and imaging studies  CBC: Recent Labs  Lab 07/15/23 1222  WBC 26.7*  HGB 12.7*  HCT 40.8  MCV 99.5  PLT 142*   Basic Metabolic Panel: Recent Labs  Lab 07/15/23 1222  NA 136  K 4.6  CL 95*  CO2 29  GLUCOSE 208*  BUN 25*  CREATININE 1.65*  CALCIUM  8.5*  MG 1.8   GFR: Estimated Creatinine Clearance: 41.7 mL/min (A) (by C-G formula based on SCr of 1.65 mg/dL (H)). Liver Function Tests: Recent Labs  Lab 07/15/23 1222  AST 20  ALT 17  ALKPHOS 83  BILITOT 0.6  PROT 6.8  ALBUMIN 3.7   No results for input(s): LIPASE, AMYLASE in the last 168 hours. No results for input(s): AMMONIA in the last 168 hours. Coagulation Profile: No results for input(s): INR, PROTIME in the last 168 hours. Cardiac Enzymes: No results for input(s): CKTOTAL, CKMB, CKMBINDEX, TROPONINI in the last 168 hours. BNP (last 3 results) No results for  input(s): PROBNP in the last 8760 hours. HbA1C: No results for input(s): HGBA1C in the last 72 hours. CBG: Recent Labs  Lab 07/15/23 1143  GLUCAP 212*   Lipid Profile: No results for input(s): CHOL, HDL, LDLCALC, TRIG, CHOLHDL, LDLDIRECT in the last 72 hours. Thyroid  Function Tests: No results for input(s): TSH, T4TOTAL, FREET4, T3FREE, THYROIDAB in the last 72 hours. Anemia Panel: No results for input(s): VITAMINB12, FOLATE, FERRITIN, TIBC, IRON , RETICCTPCT in the last 72 hours. Urine analysis:    Component Value Date/Time   COLORURINE YELLOW 09/28/2022 1201   APPEARANCEUR CLEAR 09/28/2022 1201   LABSPEC 1.014 09/28/2022 1201   PHURINE 6.0 09/28/2022 1201  GLUCOSEU >=500 (A) 09/28/2022 1201   HGBUR SMALL (A) 09/28/2022 1201   BILIRUBINUR NEGATIVE 09/28/2022 1201   KETONESUR 5 (A) 09/28/2022 1201   PROTEINUR 30 (A) 09/28/2022 1201   UROBILINOGEN 2.0 (H) 11/19/2011 0158   NITRITE NEGATIVE 09/28/2022 1201   LEUKOCYTESUR NEGATIVE 09/28/2022 1201    Radiological Exams on Admission: No results found.  Assessment/Plan Principal Problem:   Cellulitis Active Problems:   Cigarette nicotine dependence without complication   Hyperlipidemia associated with type 2 diabetes mellitus (HCC)   Hypertension associated with type 2 diabetes mellitus (HCC)   Morbid (severe) obesity due to excess calories (HCC)   Generalized anxiety disorder   History of renal cell carcinoma   Diabetic nephropathy associated with type 2 diabetes mellitus (HCC)   Thrombocytopenia (HCC)   CLL (chronic lymphocytic leukemia) (HCC)   Hypothyroidism    Bilateral lower extremity cellulitis - Blood cultures obtained - Started on Rocephin  empirically - Pictures in chart and mark area - Elevate legs - Noted to have DVT to right calf on 05/2020 - Plan to recheck ultrasound of lower extremities to reassess  Leukocytosis likely related to above and elevated in the setting  of CLL - Continue to monitor repeat CBC  Type 2 diabetes with hyperglycemia - Carb modified diet - Hold home oral agents - SSI - Recent A1c 6/13 6.1%  Elevated creatinine with prior proteinuria - Suggestive of CKD likely stage IIIb - Repeat UA  Hypertension - Continue home antihypertensives  Dyslipidemia - Continue statin  Hypothyroidism - Continue levothyroxine   History of renal cell carcinoma and CLL - Follows up with oncology  History of DVT - Continue Eliquis   Tobacco abuse - Counseled on cessation  Obesity, class II - BMI 37.84  DVT prophylaxis: Eliquis  Code Status: Full Family Communication: None at bedside Disposition Plan:Admit for treatment of cellulitis Consults called:None Admission status: Inpatient, MedSurg  Severity of Illness: The appropriate patient status for this patient is INPATIENT. Inpatient status is judged to be reasonable and necessary in order to provide the required intensity of service to ensure the patient's safety. The patient's presenting symptoms, physical exam findings, and initial radiographic and laboratory data in the context of their chronic comorbidities is felt to place them at high risk for further clinical deterioration. Furthermore, it is not anticipated that the patient will be medically stable for discharge from the hospital within 2 midnights of admission.   * I certify that at the point of admission it is my clinical judgment that the patient will require inpatient hospital care spanning beyond 2 midnights from the point of admission due to high intensity of service, high risk for further deterioration and high frequency of surveillance required.*   George Anthony D Joyanna Kleman DO [facility]  If 7PM-7AM, please contact night-coverage [email or web address]  07/15/2023, 1:09 PM

## 2023-07-16 ENCOUNTER — Inpatient Hospital Stay (HOSPITAL_COMMUNITY)

## 2023-07-16 DIAGNOSIS — L03119 Cellulitis of unspecified part of limb: Secondary | ICD-10-CM | POA: Diagnosis not present

## 2023-07-16 LAB — BASIC METABOLIC PANEL WITH GFR
Anion gap: 10 (ref 5–15)
BUN: 21 mg/dL (ref 8–23)
CO2: 30 mmol/L (ref 22–32)
Calcium: 8.1 mg/dL — ABNORMAL LOW (ref 8.9–10.3)
Chloride: 97 mmol/L — ABNORMAL LOW (ref 98–111)
Creatinine, Ser: 1.36 mg/dL — ABNORMAL HIGH (ref 0.61–1.24)
GFR, Estimated: 55 mL/min — ABNORMAL LOW (ref >60)
Glucose, Bld: 145 mg/dL — ABNORMAL HIGH (ref 70–99)
Potassium: 4.2 mmol/L (ref 3.5–5.1)
Sodium: 137 mmol/L (ref 135–145)

## 2023-07-16 LAB — CBC
HCT: 40.8 % (ref 39.0–52.0)
Hemoglobin: 12.7 g/dL — ABNORMAL LOW (ref 13.0–17.0)
MCH: 31.2 pg (ref 26.0–34.0)
MCHC: 31.1 g/dL (ref 30.0–36.0)
MCV: 100.2 fL — ABNORMAL HIGH (ref 80.0–100.0)
Platelets: 142 10*3/uL — ABNORMAL LOW (ref 150–400)
RBC: 4.07 MIL/uL — ABNORMAL LOW (ref 4.22–5.81)
RDW: 15 % (ref 11.5–15.5)
WBC: 25.8 10*3/uL — ABNORMAL HIGH (ref 4.0–10.5)
nRBC: 0 % (ref 0.0–0.2)

## 2023-07-16 LAB — GLUCOSE, CAPILLARY
Glucose-Capillary: 136 mg/dL — ABNORMAL HIGH (ref 70–99)
Glucose-Capillary: 123 mg/dL — ABNORMAL HIGH (ref 70–99)
Glucose-Capillary: 103 mg/dL — ABNORMAL HIGH (ref 70–99)
Glucose-Capillary: 151 mg/dL — ABNORMAL HIGH (ref 70–99)

## 2023-07-16 LAB — MAGNESIUM: Magnesium: 1.7 mg/dL (ref 1.7–2.4)

## 2023-07-16 MED ORDER — IPRATROPIUM-ALBUTEROL 0.5-2.5 (3) MG/3ML IN SOLN
3.0000 mL | Freq: Four times a day (QID) | RESPIRATORY_TRACT | Status: DC | PRN
  Filled 2023-07-16: qty 3

## 2023-07-16 NOTE — TOC CM/SW Note (Signed)
 Transition of Care [facility]) - Inpatient Brief Assessment   Patient Details  Name: George Anthony MRN: [id number] Date of Birth: 1950/03/11  Transition of Care [facility]) CM/SW Contact:    [name], LCSWA Phone Number: 07/16/2023, 10:03 AM   Clinical Narrative:  Transition of Care Department [facility]) has reviewed patient and no TOC needs have been identified at this time. We will continue to monitor patient advancement through interdisciplinary progression rounds. If new patient transition needs arise, please place a TOC consult.   Transition of Care Asessment: Insurance and Status: Insurance coverage has been reviewed Patient has primary care physician: Yes Home environment has been reviewed: Single Family Home Prior level of function:: Independent Prior/Current Home Services: No current home services Social Drivers of Health Review: SDOH reviewed no interventions necessary Readmission risk has been reviewed: Yes Transition of care needs: no transition of care needs at this time

## 2023-07-16 NOTE — Plan of Care (Signed)
   Problem: Activity: Goal: Risk for activity intolerance will decrease Outcome: Progressing   Problem: Coping: Goal: Level of anxiety will decrease Outcome: Progressing   Problem: Safety: Goal: Ability to remain free from injury will improve Outcome: Progressing

## 2023-07-16 NOTE — Plan of Care (Signed)
   Problem: Nutrition: Goal: Adequate nutrition will be maintained Outcome: Progressing   Problem: Coping: Goal: Level of anxiety will decrease Outcome: Progressing   Problem: Pain Managment: Goal: General experience of comfort will improve and/or be controlled Outcome: Progressing

## 2023-07-16 NOTE — Progress Notes (Signed)
 PROGRESS NOTE    George Anthony  FMW:[id number] DOB: 1950/10/02 DOA: 07/15/2023 PCP: Severa Rock HERO, FNP   Brief Narrative:    George Anthony is a 73 y.o. male with medical history significant for metastatic renal cell carcinoma, CLL, DVT, type 2 diabetes, dyslipidemia, hypertension, hypothyroidism, and anxiety disorder who is being seen by his oncologist at the cancer center this morning for follow-up regarding his CLL when he was noted to have significant swelling to his lower extremities with concern for cellulitis and was recommended for direct admission.   Assessment & Plan:   Principal Problem:   Cellulitis Active Problems:   Cigarette nicotine dependence without complication   Hyperlipidemia associated with type 2 diabetes mellitus (HCC)   Hypertension associated with type 2 diabetes mellitus (HCC)   Morbid (severe) obesity due to excess calories (HCC)   Generalized anxiety disorder   History of renal cell carcinoma   Diabetic nephropathy associated with type 2 diabetes mellitus (HCC)   Thrombocytopenia (HCC)   CLL (chronic lymphocytic leukemia) (HCC)   Hypothyroidism  Assessment and Plan:  Bilateral lower extremity cellulitis-improving - Blood cultures obtained with results still pending - Started on Rocephin  empirically - Pictures in chart and mark area - Elevate legs - Noted to have DVT to right calf on 05/2020 - Repeat ultrasound with no DVT noted in either extremity -Patient is having significant diuresis with home Lasix  dose, question compliance at home   Leukocytosis likely related to above and elevated in the setting of CLL - Continue to monitor repeat CBC   Type 2 diabetes with hyperglycemia - Carb modified diet - Hold home oral agents - SSI - Recent A1c 6/13 6.1%   Elevated creatinine with prior proteinuria - Suggestive of CKD likely stage IIIb - Repeat UA   Hypertension - Continue home antihypertensives   Dyslipidemia - Continue statin    Hypothyroidism - Continue levothyroxine    History of renal cell carcinoma and CLL - Follows up with oncology   History of DVT - Continue Eliquis    Tobacco abuse - Counseled on cessation   Obesity, class II - BMI 37.84    DVT prophylaxis:Eliquis  Code Status: Full Family Communication: None at bedside Disposition Plan:  Status is: Inpatient Remains inpatient appropriate because: Need for IV medications.   Consultants:  None  Procedures:  None  Antimicrobials:  Anti-infectives (From admission, onward)    Start     Dose/Rate Route Frequency Ordered Stop   07/15/23 1230  cefTRIAXone  (ROCEPHIN ) 2 g in sodium chloride  0.9 % 100 mL IVPB        2 g 200 mL/hr over 30 Minutes Intravenous Every 24 hours 07/15/23 1150         Subjective: Patient seen and evaluated today with no new acute complaints or concerns. No acute concerns or events noted overnight.  Objective: Vitals:   07/16/23 1000 07/16/23 1100 07/16/23 1200 07/16/23 1300  BP: 117/61 118/77 (!) 121/90 120/70  Pulse: 74 81 77 78  Resp: 20 (!) 24 (!) 22 17  Temp:  97.7 F (36.5 C) 97.7 F (36.5 C)   TempSrc:  Oral Oral   SpO2: 97% 97% 97% 97%  Weight:      Height:        Intake/Output Summary (Last 24 hours) at 07/16/2023 1327 Last data filed at 07/16/2023 1210 Gross per 24 hour  Intake 48.9 ml  Output 3025 ml  Net -2976.1 ml   Filed Weights   07/15/23 1143  Weight: 96.9  kg    Examination:  General exam: Appears calm and comfortable  Respiratory system: Clear to auscultation. Respiratory effort normal. Cardiovascular system: S1 & S2 heard, RRR.  Gastrointestinal system: Abdomen is soft Central nervous system: Alert and awake Extremities: Bilateral lower extremities have been wrapped, swelling appears to be improving Skin: No significant lesions noted Psychiatry: Flat affect.    Data Reviewed: I have personally reviewed following labs and imaging studies  CBC: Recent Labs  Lab  07/15/23 1222 07/16/23 0422  WBC 26.7* 25.8*  HGB 12.7* 12.7*  HCT 40.8 40.8  MCV 99.5 100.2*  PLT 142* 142*   Basic Metabolic Panel: Recent Labs  Lab 07/15/23 1222 07/16/23 0422  NA 136 137  K 4.6 4.2  CL 95* 97*  CO2 29 30  GLUCOSE 208* 145*  BUN 25* 21  CREATININE 1.65* 1.36*  CALCIUM  8.5* 8.1*  MG 1.8 1.7   GFR: Estimated Creatinine Clearance: 50.6 mL/min (A) (by C-G formula based on SCr of 1.36 mg/dL (H)). Liver Function Tests: Recent Labs  Lab 07/15/23 1222  AST 20  ALT 17  ALKPHOS 83  BILITOT 0.6  PROT 6.8  ALBUMIN 3.7   No results for input(s): LIPASE, AMYLASE in the last 168 hours. No results for input(s): AMMONIA in the last 168 hours. Coagulation Profile: No results for input(s): INR, PROTIME in the last 168 hours. Cardiac Enzymes: No results for input(s): CKTOTAL, CKMB, CKMBINDEX, TROPONINI in the last 168 hours. BNP (last 3 results) No results for input(s): PROBNP in the last 8760 hours. HbA1C: No results for input(s): HGBA1C in the last 72 hours. CBG: Recent Labs  Lab 07/15/23 1143 07/15/23 2102 07/16/23 0717 07/16/23 1159  GLUCAP 212* 139* 136* 123*   Lipid Profile: No results for input(s): CHOL, HDL, LDLCALC, TRIG, CHOLHDL, LDLDIRECT in the last 72 hours. Thyroid  Function Tests: No results for input(s): TSH, T4TOTAL, FREET4, T3FREE, THYROIDAB in the last 72 hours. Anemia Panel: No results for input(s): VITAMINB12, FOLATE, FERRITIN, TIBC, IRON , RETICCTPCT in the last 72 hours. Sepsis Labs: Recent Labs  Lab 07/15/23 1222  LATICACIDVEN 1.0    Recent Results (from the past 240 hours)  MRSA Next Gen by PCR, Nasal     Status: None   Collection Time: 07/15/23 11:30 AM   Specimen: Nasal Mucosa; Nasal Swab  Result Value Ref Range Status   MRSA by PCR Next Gen NOT DETECTED NOT DETECTED Final    Comment: (NOTE) The GeneXpert MRSA Assay (FDA approved for NASAL specimens only), is  one component of a comprehensive MRSA colonization surveillance program. It is not intended to diagnose MRSA infection nor to guide or monitor treatment for MRSA infections. Test performance is not FDA approved in patients less than 59 years old. Performed at [facility], [address], [place], [place] [address]          Radiology Studies: US  Venous Img Lower Bilateral (DVT) Result Date: 07/15/2023 CLINICAL DATA:  Bilateral leg swelling. EXAM: BILATERAL LOWER EXTREMITY VENOUS DOPPLER ULTRASOUND TECHNIQUE: Gray-scale sonography with graded compression, as well as color Doppler and duplex ultrasound were performed to evaluate the lower extremity deep venous systems from the level of the common femoral vein and including the common femoral, femoral, profunda femoral, popliteal and calf veins including the posterior tibial, peroneal and gastrocnemius veins when visible. The superficial great saphenous vein was also interrogated. Spectral Doppler was utilized to evaluate flow at rest and with distal augmentation maneuvers in the common femoral, femoral and popliteal veins. COMPARISON:  None Available. FINDINGS: RIGHT LOWER EXTREMITY Common Femoral Vein: No evidence of thrombus. Normal compressibility, respiratory phasicity and response to augmentation. Saphenofemoral Junction: No evidence of thrombus. Normal compressibility and flow on color Doppler imaging. Profunda Femoral Vein: No evidence of thrombus. Normal compressibility and flow on color Doppler imaging. Femoral Vein: No evidence of thrombus. Normal compressibility, respiratory phasicity and response to augmentation. Popliteal Vein: No evidence of thrombus. Normal compressibility, respiratory phasicity and response to augmentation. Calf Veins: Limited evaluation. Other Findings:  None. LEFT LOWER EXTREMITY Common Femoral Vein: No evidence of thrombus. Normal compressibility, respiratory phasicity and response to augmentation. Saphenofemoral  Junction: No evidence of thrombus. Normal compressibility and flow on color Doppler imaging. Profunda Femoral Vein: No evidence of thrombus. Normal compressibility and flow on color Doppler imaging. Femoral Vein: Limited compressibility of the mid and distal left femoral vein but likely related to body habitus. There is color Doppler flow in the left femoral vein and no evidence for thrombus. Popliteal Vein: No evidence of thrombus. Normal compressibility, respiratory phasicity and response to augmentation. Calf Veins: Limited evaluation. Other Findings:  None. IMPRESSION: Negative for deep venous thrombosis in either lower extremity. Study has technical limitations and very limited evaluation of the bilateral calf veins. Electronically Signed   By: Juliene Balder M.D.   On: 07/15/2023 16:44        Scheduled Meds:  apixaban   5 mg Oral BID   atorvastatin   40 mg Oral QHS   Chlorhexidine  Gluconate Cloth  6 each Topical Daily   escitalopram   10 mg Oral QHS   furosemide   40 mg Oral Daily   insulin  aspart  0-5 Units Subcutaneous QHS   insulin  aspart  0-9 Units Subcutaneous TID WC   lenvatinib  10 mg daily dose  10 mg Oral Q24H   levothyroxine   50 mcg Oral QAC breakfast   lisinopril   10 mg Oral Daily   Continuous Infusions:  cefTRIAXone  (ROCEPHIN )  IV 200 mL/hr at 07/16/23 1200     LOS: 1 day    Time spent: 55 minutes    Jennings Corado D Maree, DO [facility]  If 7PM-7AM, please contact night-coverage [email or web address] 07/16/2023, 1:27 PM

## 2023-07-16 NOTE — Progress Notes (Signed)
 RT asked by RN to come assess and treat patient for PRN nebulizer. The patient was found to be on 2lpm Fowler with SPO2 97%. He was a slight forced exhaled wheeze but clear otherwise. He stated he's not short of breath and not coughing. PRN order in place if needed. Medication not deemed necessary at this time.

## 2023-07-17 DIAGNOSIS — L03119 Cellulitis of unspecified part of limb: Secondary | ICD-10-CM | POA: Diagnosis not present

## 2023-07-17 LAB — CBC
HCT: 39 % (ref 39.0–52.0)
Hemoglobin: 12.1 g/dL — ABNORMAL LOW (ref 13.0–17.0)
MCH: 31 pg (ref 26.0–34.0)
MCHC: 31 g/dL (ref 30.0–36.0)
MCV: 100 fL (ref 80.0–100.0)
Platelets: 130 10*3/uL — ABNORMAL LOW (ref 150–400)
RBC: 3.9 MIL/uL — ABNORMAL LOW (ref 4.22–5.81)
RDW: 14.6 % (ref 11.5–15.5)
WBC: 26.2 10*3/uL — ABNORMAL HIGH (ref 4.0–10.5)
nRBC: 0.1 % (ref 0.0–0.2)

## 2023-07-17 LAB — BASIC METABOLIC PANEL WITH GFR
Anion gap: 10 (ref 5–15)
BUN: 24 mg/dL — ABNORMAL HIGH (ref 8–23)
CO2: 32 mmol/L (ref 22–32)
Calcium: 8 mg/dL — ABNORMAL LOW (ref 8.9–10.3)
Chloride: 92 mmol/L — ABNORMAL LOW (ref 98–111)
Creatinine, Ser: 1.67 mg/dL — ABNORMAL HIGH (ref 0.61–1.24)
GFR, Estimated: 43 mL/min — ABNORMAL LOW (ref >60)
Glucose, Bld: 123 mg/dL — ABNORMAL HIGH (ref 70–99)
Potassium: 4.2 mmol/L (ref 3.5–5.1)
Sodium: 134 mmol/L — ABNORMAL LOW (ref 135–145)

## 2023-07-17 LAB — GLUCOSE, CAPILLARY
Glucose-Capillary: 129 mg/dL — ABNORMAL HIGH (ref 70–99)
Glucose-Capillary: 132 mg/dL — ABNORMAL HIGH (ref 70–99)

## 2023-07-17 LAB — MAGNESIUM: Magnesium: 1.6 mg/dL — ABNORMAL LOW (ref 1.7–2.4)

## 2023-07-17 MED ORDER — SODIUM CHLORIDE 0.9 % IV SOLN: INTRAVENOUS | Status: DC | PRN

## 2023-07-17 MED ORDER — MAGNESIUM SULFATE 2 GM/50ML IV SOLN
2.0000 g | Freq: Once | INTRAVENOUS | Status: AC
  Administered 2023-07-17: 2 g via INTRAVENOUS
  Filled 2023-07-17: qty 50

## 2023-07-17 MED ORDER — DOXYCYCLINE HYCLATE 100 MG PO TABS: 100.0000 mg | ORAL_TABLET | Freq: Two times a day (BID) | ORAL | 0 refills | Status: AC

## 2023-07-17 NOTE — Discharge Summary (Signed)
 Physician Discharge Summary  George Anthony FMW:[id number] DOB: [birth year] DOA: 07/15/2023  PCP: Severa Rock HERO, FNP  Admit date: 07/15/2023  Discharge date: 07/17/2023  Admitted From: Home  Disposition: Home  Recommendations for Outpatient Follow-up:  Follow up with PCP in 1-2 weeks Follow-up with oncology as previously scheduled Continue on doxycycline as prescribed for 8 more days to complete total 10-day course of treatment Continue other home medications as prior and avoid use of ACE inhibitor and monitor blood pressures closely outpatient.  May resume once blood pressures are known to be stable  Home Health: None  Equipment/Devices: None  Discharge Condition:Stable  CODE STATUS: Full  Diet recommendation: Heart Healthy/carb modified  Brief/Interim Summary: George Anthony is a 73 y.o. male with medical history significant for metastatic renal cell carcinoma, CLL, DVT, type 2 diabetes, dyslipidemia, hypertension, hypothyroidism, and anxiety disorder who is being seen by his oncologist at the cancer center this morning for follow-up regarding his CLL when he was noted to have significant swelling to his lower extremities with concern for cellulitis and was recommended for direct admission.  Patient was treated for bilateral lower extremity cellulitis with empiric IV Rocephin  and no growth on blood cultures noted.  Repeat ultrasound of bilateral lower extremities with no findings of DVT noted.  He appears to be in stable condition today for transition to oral antibiotics and will need to complete 10-day course of treatment.  He has otherwise ambulated without any significant orthostasis or hypoxemia and will need to follow-up with his oncologist Dr. Davonna as previously scheduled.  Discharge Diagnoses:  Principal Problem:   Cellulitis Active Problems:   Cigarette nicotine dependence without complication   Hyperlipidemia associated with type 2 diabetes mellitus (HCC)    Hypertension associated with type 2 diabetes mellitus (HCC)   Morbid (severe) obesity due to excess calories (HCC)   Generalized anxiety disorder   History of renal cell carcinoma   Diabetic nephropathy associated with type 2 diabetes mellitus (HCC)   Thrombocytopenia (HCC)   CLL (chronic lymphocytic leukemia) (HCC)   Hypothyroidism  Principal discharge diagnosis: Bilateral lower extremity cellulitis likely in the setting of venous stasis.  Discharge Instructions  Discharge Instructions     Diet - low sodium heart healthy   Complete by: As directed    Discharge wound care:   Complete by: As directed    Daily      Comments: After ultrasound is performed:  Apply Xerform gauze over bilat legs and cover with ABD pads and kerlex Q day.   Increase activity slowly   Complete by: As directed       Allergies as of 07/17/2023   No Known Allergies      Medication List     STOP taking these medications    lisinopril  10 MG tablet Commonly known as: ZESTRIL        TAKE these medications    acetaminophen  500 MG tablet Commonly known as: TYLENOL  Take 1,300 mg by mouth every 6 (six) hours as needed for moderate pain. 650 mg tab   atorvastatin  40 MG tablet Commonly known as: LIPITOR Take 1 tablet (40 mg total) by mouth at bedtime.   benzonatate  100 MG capsule Commonly known as: Tessalon  Perles Take 2 capsules (200 mg total) by mouth 3 (three) times daily as needed.   doxycycline 100 MG tablet Commonly known as: VIBRA-TABS Take 1 tablet (100 mg total) by mouth 2 (two) times daily for 8 days.   Eliquis  5 MG Tabs tablet Generic  drug: apixaban  Take 1 tablet by mouth twice daily   escitalopram  10 MG tablet Commonly known as: LEXAPRO  Take 1 tablet by mouth once daily   furosemide  20 MG tablet Commonly known as: LASIX  Take 2 tablets (40 mg total) by mouth daily.   Lenvima  (10 MG Daily Dose) capsule Generic drug: lenvatinib  10 mg daily dose Take 1 capsule (10 mg total)  by mouth daily.   levothyroxine  50 MCG tablet Commonly known as: Synthroid  Take 1 tablet (50 mcg total) by mouth daily before breakfast.   nystatin powder Commonly known as: MYCOSTATIN/NYSTOP Apply 1 Application topically 2 (two) times daily.   prochlorperazine 10 MG tablet Commonly known as: COMPAZINE Take 10 mg by mouth every 6 (six) hours as needed.   terbinafine  1 % cream Commonly known as: LAMISIL  Apply 1 Application topically 2 (two) times daily.               Discharge Care Instructions  (From admission, onward)           Start     Ordered   07/17/23 0000  Discharge wound care:       Comments: Daily      Comments: After ultrasound is performed:  Apply Xerform gauze over bilat legs and cover with ABD pads and kerlex Q day.   07/17/23 1301            Follow-up Information     Rakes, Rock HERO, FNP. Schedule an appointment as soon as possible for a visit in 1 week(s).   Specialty: Family Medicine Contact information: [address] [place] [address] [phone]                No Known Allergies  Consultations: None   Procedures/Studies: DG CHEST PORT 1 VIEW Result Date: 07/16/2023 CLINICAL DATA:  Metastatic renal cell carcinoma.  Wheezing. EXAM: PORTABLE CHEST 1 VIEW COMPARISON:  09/27/2022 FINDINGS: Prior median sternotomy. Apical lordotic positioning. Midline trachea. Borderline cardiomegaly. No pleural effusion or pneumothorax. Clear lungs. IMPRESSION: No active disease. Electronically Signed   By: Rockey Kilts M.D.   On: 07/16/2023 14:04   US  Venous Img Lower Bilateral (DVT) Result Date: 07/15/2023 CLINICAL DATA:  Bilateral leg swelling. EXAM: BILATERAL LOWER EXTREMITY VENOUS DOPPLER ULTRASOUND TECHNIQUE: Gray-scale sonography with graded compression, as well as color Doppler and duplex ultrasound were performed to evaluate the lower extremity deep venous systems from the level of the common femoral vein and including the common  femoral, femoral, profunda femoral, popliteal and calf veins including the posterior tibial, peroneal and gastrocnemius veins when visible. The superficial great saphenous vein was also interrogated. Spectral Doppler was utilized to evaluate flow at rest and with distal augmentation maneuvers in the common femoral, femoral and popliteal veins. COMPARISON:  None Available. FINDINGS: RIGHT LOWER EXTREMITY Common Femoral Vein: No evidence of thrombus. Normal compressibility, respiratory phasicity and response to augmentation. Saphenofemoral Junction: No evidence of thrombus. Normal compressibility and flow on color Doppler imaging. Profunda Femoral Vein: No evidence of thrombus. Normal compressibility and flow on color Doppler imaging. Femoral Vein: No evidence of thrombus. Normal compressibility, respiratory phasicity and response to augmentation. Popliteal Vein: No evidence of thrombus. Normal compressibility, respiratory phasicity and response to augmentation. Calf Veins: Limited evaluation. Other Findings:  None. LEFT LOWER EXTREMITY Common Femoral Vein: No evidence of thrombus. Normal compressibility, respiratory phasicity and response to augmentation. Saphenofemoral Junction: No evidence of thrombus. Normal compressibility and flow on color Doppler imaging. Profunda Femoral Vein: No evidence of thrombus. Normal  compressibility and flow on color Doppler imaging. Femoral Vein: Limited compressibility of the mid and distal left femoral vein but likely related to body habitus. There is color Doppler flow in the left femoral vein and no evidence for thrombus. Popliteal Vein: No evidence of thrombus. Normal compressibility, respiratory phasicity and response to augmentation. Calf Veins: Limited evaluation. Other Findings:  None. IMPRESSION: Negative for deep venous thrombosis in either lower extremity. Study has technical limitations and very limited evaluation of the bilateral calf veins. Electronically Signed   By:  Juliene Balder M.D.   On: 07/15/2023 16:44   CT CHEST ABDOMEN PELVIS W CONTRAST Result Date: 07/13/2023 CLINICAL DATA:  Renal cell carcinoma. RIGHT renal. * Tracking Code: BO * EXAM: CT CHEST, ABDOMEN, AND PELVIS WITH CONTRAST TECHNIQUE: Multidetector CT imaging of the chest, abdomen and pelvis was performed following the standard protocol during bolus administration of intravenous contrast. RADIATION DOSE REDUCTION: This exam was performed according to the departmental dose-optimization program which includes automated exposure control, adjustment of the mA and/or kV according to patient size and/or use of iterative reconstruction technique. CONTRAST:  75mL OMNIPAQUE  IOHEXOL  300 MG/ML  SOLN COMPARISON:  CT chest 09/27/2022, CT chest abdomen pelvis 08/09/2020 FINDINGS: CT CHEST FINDINGS Cardiovascular: Post CABG.  No acute cardiac findings Mediastinum/Nodes: No axillary or supraclavicular adenopathy. No mediastinal or hilar adenopathy. No pericardial fluid. Esophagus normal. Lungs/Pleura: Rounded nodule in the medial aspect of the RIGHT lower lobe along the pleural surface measuring 16 mm (image 102/series 3. On comparison CT scan there was dense atelectasis medially at this level. No nodule present on more remote CT scan. No additional pulmonary nodules. Musculoskeletal: No aggressive osseous lesion. CT ABDOMEN AND PELVIS FINDINGS Hepatobiliary: Linear low-density lesion in the RIGHT hepatic lobe measures 20 mm x 11 mm (image 43/2). Rounded lesion the RIGHT hepatic lobe measures 10 mm on image 50. Postcholecystectomy Pancreas: Pancreas is normal. No ductal dilatation. No pancreatic inflammation. Spleen: Normal spleen Adrenals/urinary tract: Round enlargement of the LEFT adrenal gland to 14 mm not changed from prior (54/2). Post RIGHT nephrectomy. No nodularity in nephrectomy bed. LEFT kidney is normal. Small nonenhancing exophytic cyst from the posterior LEFT kidney measures 10 mm. LEFT ureter bladder normal.  Stomach/Bowel: Stomach, small bowel, appendix, and cecum are normal. The colon and rectosigmoid colon are normal. Vascular/Lymphatic: Abdominal aorta normal caliber. Intimal calcifications. There several small periportal lymph nodes Retroperitoneal lymph nodes are not pathologic by size criteria. Mildly enlarged external iliac lymph nodes measures 10 mm on the LEFT (image 100/2). Similar distal external iliac node on the RIGHT measuring 11 mm on image 100. Reproductive: Prostate unremarkable Other: No free fluid. Musculoskeletal: No aggressive osseous lesion. IMPRESSION: CHEST: 1. Rounded nodule in the medial aspect of the RIGHT lower lobe is concerning for renal cell carcinoma metastasis. 2. No thoracic adenopathy. PELVIS: 1. Post RIGHT nephrectomy. No evidence of local recurrence. 2. Two low-density lesions in the RIGHT hepatic lobe are indeterminate. Consider MRI of the liver evaluate for metastasis. 3. Mildly enlarged external iliac lymph nodes are are NOT favored renal cell carcinoma metastasis 4. Stable round enlargement of the LEFT adrenal gland is stable. 5. No skeletal metastasis Electronically Signed   By: Jackquline Boxer M.D.   On: 07/13/2023 09:57     Discharge Exam: Vitals:   07/17/23 0826 07/17/23 0828  BP: 104/68   Pulse:    Resp:    Temp:    SpO2: (!) 89% 92%   Vitals:   07/17/23 0524 07/17/23 0529 07/17/23  9173 07/17/23 0828  BP:  (!) 93/58 104/68   Pulse: 76 80    Resp: 18     Temp: 97.8 F (36.6 C)     TempSrc: Oral     SpO2: 92%  (!) 89% 92%  Weight:      Height:        General: Pt is alert, awake, not in acute distress Cardiovascular: RRR, S1/S2 +, no rubs, no gallops Respiratory: CTA bilaterally, no wheezing, no rhonchi Abdominal: Soft, NT, ND, bowel sounds + Extremities: Bilateral edema and erythema much improved.    The results of significant diagnostics from this hospitalization (including imaging, microbiology, ancillary and laboratory) are listed below  for reference.     Microbiology: Recent Results (from the past 240 hours)  MRSA Next Gen by PCR, Nasal     Status: None   Collection Time: 07/15/23 11:30 AM   Specimen: Nasal Mucosa; Nasal Swab  Result Value Ref Range Status   MRSA by PCR Next Gen NOT DETECTED NOT DETECTED Final    Comment: (NOTE) The GeneXpert MRSA Assay (FDA approved for NASAL specimens only), is one component of a comprehensive MRSA colonization surveillance program. It is not intended to diagnose MRSA infection nor to guide or monitor treatment for MRSA infections. Test performance is not FDA approved in patients less than 77 years old. Performed at [facility], [address] [place], [place] [address]   Culture, blood (Routine X 2) w Reflex to ID Panel     Status: None (Preliminary result)   Collection Time: 07/15/23 12:21 PM   Specimen: BLOOD  Result Value Ref Range Status   Specimen Description BLOOD BLOOD RIGHT ARM  Final   Special Requests   Final    BOTTLES DRAWN AEROBIC AND ANAEROBIC Blood Culture adequate volume   Culture   Final    NO GROWTH 2 DAYS Performed at [facility], [address], [place], [place] [address]    Report Status PENDING  Incomplete  Culture, blood (Routine X 2) w Reflex to ID Panel     Status: None (Preliminary result)   Collection Time: 07/15/23 12:22 PM   Specimen: BLOOD  Result Value Ref Range Status   Specimen Description BLOOD BLOOD LEFT HAND  Final   Special Requests   Final    BOTTLES DRAWN AEROBIC AND ANAEROBIC Blood Culture results may not be optimal due to an inadequate volume of blood received in culture bottles   Culture   Final    NO GROWTH 2 DAYS Performed at [facility], [address] [place], [place] [address]    Report Status PENDING  Incomplete     Labs: BNP (last 3 results) No results for input(s): BNP in the last 8760 hours. Basic Metabolic Panel: Recent Labs  Lab 07/15/23 1222 07/16/23 0422 07/17/23 0335  NA 136 137 134*  K 4.6 4.2 4.2   CL 95* 97* 92*  CO2 29 30 32  GLUCOSE 208* 145* 123*  BUN 25* 21 24*  CREATININE 1.65* 1.36* 1.67*  CALCIUM  8.5* 8.1* 8.0*  MG 1.8 1.7 1.6*   Liver Function Tests: Recent Labs  Lab 07/15/23 1222  AST 20  ALT 17  ALKPHOS 83  BILITOT 0.6  PROT 6.8  ALBUMIN 3.7   No results for input(s): LIPASE, AMYLASE in the last 168 hours. No results for input(s): AMMONIA in the last 168 hours. CBC: Recent Labs  Lab 07/15/23 1222 07/16/23 0422 07/17/23 0335  WBC 26.7* 25.8* 26.2*  HGB  12.7* 12.7* 12.1*  HCT 40.8 40.8 39.0  MCV 99.5 100.2* 100.0  PLT 142* 142* 130*   Cardiac Enzymes: No results for input(s): CKTOTAL, CKMB, CKMBINDEX, TROPONINI in the last 168 hours. BNP: Invalid input(s): POCBNP CBG: Recent Labs  Lab 07/16/23 1159 07/16/23 1644 07/16/23 2138 07/17/23 0715 07/17/23 1129  GLUCAP 123* 103* 151* 129* 132*   D-Dimer No results for input(s): DDIMER in the last 72 hours. Hgb A1c No results for input(s): HGBA1C in the last 72 hours. Lipid Profile No results for input(s): CHOL, HDL, LDLCALC, TRIG, CHOLHDL, LDLDIRECT in the last 72 hours. Thyroid  function studies No results for input(s): TSH, T4TOTAL, T3FREE, THYROIDAB in the last 72 hours.  Invalid input(s): FREET3 Anemia work up No results for input(s): VITAMINB12, FOLATE, FERRITIN, TIBC, IRON , RETICCTPCT in the last 72 hours. Urinalysis    Component Value Date/Time   COLORURINE YELLOW 09/28/2022 1201   APPEARANCEUR CLEAR 09/28/2022 1201   LABSPEC 1.014 09/28/2022 1201   PHURINE 6.0 09/28/2022 1201   GLUCOSEU >=500 (A) 09/28/2022 1201   HGBUR SMALL (A) 09/28/2022 1201   BILIRUBINUR NEGATIVE 09/28/2022 1201   KETONESUR 5 (A) 09/28/2022 1201   PROTEINUR 30 (A) 09/28/2022 1201   UROBILINOGEN 2.0 (H) 11/19/2011 0158   NITRITE NEGATIVE 09/28/2022 1201   LEUKOCYTESUR NEGATIVE 09/28/2022 1201   Sepsis Labs Recent Labs  Lab 07/15/23 1222  07/16/23 0422 07/17/23 0335  WBC 26.7* 25.8* 26.2*   Microbiology Recent Results (from the past 240 hours)  MRSA Next Gen by PCR, Nasal     Status: None   Collection Time: 07/15/23 11:30 AM   Specimen: Nasal Mucosa; Nasal Swab  Result Value Ref Range Status   MRSA by PCR Next Gen NOT DETECTED NOT DETECTED Final    Comment: (NOTE) The GeneXpert MRSA Assay (FDA approved for NASAL specimens only), is one component of a comprehensive MRSA colonization surveillance program. It is not intended to diagnose MRSA infection nor to guide or monitor treatment for MRSA infections. Test performance is not FDA approved in patients less than 72 years old. Performed at [facility], [address], [place], [place] [address]   Culture, blood (Routine X 2) w Reflex to ID Panel     Status: None (Preliminary result)   Collection Time: 07/15/23 12:21 PM   Specimen: BLOOD  Result Value Ref Range Status   Specimen Description BLOOD BLOOD RIGHT ARM  Final   Special Requests   Final    BOTTLES DRAWN AEROBIC AND ANAEROBIC Blood Culture adequate volume   Culture   Final    NO GROWTH 2 DAYS Performed at [facility], [address], [place], [place] [address]    Report Status PENDING  Incomplete  Culture, blood (Routine X 2) w Reflex to ID Panel     Status: None (Preliminary result)   Collection Time: 07/15/23 12:22 PM   Specimen: BLOOD  Result Value Ref Range Status   Specimen Description BLOOD BLOOD LEFT HAND  Final   Special Requests   Final    BOTTLES DRAWN AEROBIC AND ANAEROBIC Blood Culture results may not be optimal due to an inadequate volume of blood received in culture bottles   Culture   Final    NO GROWTH 2 DAYS Performed at [facility], [address], [place], [place] [address]    Report Status PENDING  Incomplete     Time coordinating discharge: 35 minutes  SIGNED:   Adron JONETTA Fairly, DO [facility] 07/17/2023, 1:01 PM  If 7PM-7AM,  please contact  night-coverage [email or web address]

## 2023-07-17 NOTE — Progress Notes (Signed)
 Mobility Specialist Progress Note:    07/17/23 1140  Mobility  Activity Ambulated with assistance in hallway  Level of Assistance Standby assist, set-up cues, supervision of patient - no hands on  Assistive Device None  Distance Ambulated (ft) 110 ft  Range of Motion/Exercises Active;All extremities  Activity Response Tolerated well  Mobility Referral Yes  Mobility visit 1 Mobility  Mobility Specialist Start Time (ACUTE ONLY) 1140  Mobility Specialist Stop Time (ACUTE ONLY) 1200  Mobility Specialist Time Calculation (min) (ACUTE ONLY) 20 min   Pt received in chair, agreeable to mobility. Required supervision to stand and ambulate with no AD. Tolerated well,SpO2 94% on RA at rest. SpO2 90-93% on RA during ambulation. Returned pt to chair with NT in room, all needs met.  Dawt Reeb Mobility Specialist Please contact via Special educational needs teacher or  Rehab office at [phone]

## 2023-07-17 NOTE — Progress Notes (Addendum)
 Ambulated and stayed about 90 % on room air.  Completed IV abx.   Wound care completed with wife  and materials sent.  New scripts sent to pharmacy.  Home meds delivered from pharmacy. Wife to drive home

## 2023-07-17 NOTE — Care Management Important Message (Signed)
 Important Message  Patient Details  Name: George Anthony MRN: [id number] Date of Birth: [birth year]   Important Message Given:  Yes - Medicare IM     Jaxten Brosh L Theora Vankirk 07/17/2023, 11:55 AM

## 2023-07-17 NOTE — Evaluation (Signed)
 Physical Therapy Evaluation Patient Details Name: George Anthony MRN: [id number] DOB: 10/08/1950 Today's Date: 07/17/2023  History of Present Illness  George Anthony is a 73 y.o. male with medical history significant for metastatic renal cell carcinoma, CLL, DVT, type 2 diabetes, dyslipidemia, hypertension, hypothyroidism, and anxiety disorder who is being seen by his oncologist at the cancer center this morning for follow-up regarding his CLL when he was noted to have significant swelling to his lower extremities with concern for cellulitis and was recommended for direct admission.  He states that he has had significant swelling with tenderness and pain that began approximately 3 weeks ago and has been worsening.  He has tried elevating his legs with no improvement.  He denies any fevers or chills and states that he has otherwise been taking his home medications as prescribed.  He states that he does not check his blood glucose on a regular basis.   Clinical Impression  Patient functioning near baseline for functional mobility and gait demonstrating good return for ambulating in room, hallway without knee for an AD. Patient tolerated sitting up in chair after therapy - RN aware. PLAN:  Patient to be discharged home today and discharged from acute physical therapy to care of nursing for ambulation as tolerated for length of stay with recommendations stated below           If plan is discharge home, recommend the following: Help with stairs or ramp for entrance;A little help with walking and/or transfers;A little help with bathing/dressing/bathroom;Assistance with cooking/housework   Can travel by private vehicle        Equipment Recommendations None recommended by PT  Recommendations for Other Services       Functional Status Assessment Patient has had a recent decline in their functional status and demonstrates the ability to make significant improvements in function in a reasonable  and predictable amount of time.     Precautions / Restrictions Precautions Precautions: Fall Recall of Precautions/Restrictions: Intact Restrictions Weight Bearing Restrictions Per Provider Order: No      Mobility  Bed Mobility Overal bed mobility: Modified Independent                  Transfers Overall transfer level: Needs assistance Equipment used: 1 person hand held assist               General transfer comment: slow labored movement  having to occasionally lean on armrest of chair for support    Ambulation/Gait Ambulation/Gait assistance: Supervision Gait Distance (Feet): 80 Feet Assistive device: IV Pole Gait Pattern/deviations: Decreased step length - right, Decreased step length - left, Decreased stride length Gait velocity: decreased     General Gait Details: fair/good return for ambulating in room without AD, able to push IV pole in hallway without loss of balance, on room air with SpO2 dropping from 91% to 86%  Stairs            Wheelchair Mobility     Tilt Bed    Modified Rankin (Stroke Patients Only)       Balance Overall balance assessment: Needs assistance Sitting-balance support: Feet supported, No upper extremity supported Sitting balance-Leahy Scale: Good Sitting balance - Comments: seated at EOB   Standing balance support: During functional activity, No upper extremity supported Standing balance-Leahy Scale: Fair Standing balance comment: without AD  Pertinent Vitals/Pain Pain Assessment Pain Assessment: No/denies pain    Home Living Family/patient expects to be discharged to:: Private residence Living Arrangements: Spouse/significant other Available Help at Discharge: Family;Available PRN/intermittently Type of Home: House Home Access: Stairs to enter Entrance Stairs-Rails: Right Entrance Stairs-Number of Steps: 12   Home Layout: One level Home Equipment: Cane - single  point      Prior Function Prior Level of Function : Independent/Modified Independent;Driving             Mobility Comments: community ambulator without AD ADLs Comments: Independent     Extremity/Trunk Assessment   Upper Extremity Assessment Upper Extremity Assessment: Overall WFL for tasks assessed    Lower Extremity Assessment Lower Extremity Assessment: Generalized weakness    Cervical / Trunk Assessment Cervical / Trunk Assessment: Normal  Communication   Communication Communication: No apparent difficulties    Cognition Arousal: Alert Behavior During Therapy: WFL for tasks assessed/performed   PT - Cognitive impairments: No apparent impairments                         Following commands: Intact       Cueing Cueing Techniques: Verbal cues, Tactile cues     General Comments      Exercises     Assessment/Plan    PT Assessment Patient does not need any further PT services  PT Problem List         PT Treatment Interventions      PT Goals (Current goals can be found in the Care Plan section)  Acute Rehab PT Goals Patient Stated Goal: return home with family to assist PT Goal Formulation: With patient Time For Goal Achievement: 07/17/23 Potential to Achieve Goals: Good    Frequency       Co-evaluation               AM-PAC PT 6 Clicks Mobility  Outcome Measure Help needed turning from your back to your side while in a flat bed without using bedrails?: None Help needed moving from lying on your back to sitting on the side of a flat bed without using bedrails?: None Help needed moving to and from a bed to a chair (including a wheelchair)?: A Little Help needed standing up from a chair using your arms (e.g., wheelchair or bedside chair)?: A Little Help needed to walk in hospital room?: A Little Help needed climbing 3-5 steps with a railing? : A Little 6 Click Score: 20    End of Session   Activity Tolerance: Patient  tolerated treatment well;Patient limited by fatigue Patient left: in chair;with call bell/phone within reach Nurse Communication: Mobility status PT Visit Diagnosis: Unsteadiness on feet (R26.81);Other abnormalities of gait and mobility (R26.89);Muscle weakness (generalized) (M62.81)    Time: 1010-1038 PT Time Calculation (min) (ACUTE ONLY): 28 min   Charges:   PT Evaluation $PT Eval Moderate Complexity: 1 Mod PT Treatments $Therapeutic Activity: 23-37 mins PT General Charges $$ ACUTE PT VISIT: 1 Visit         2:09 PM, 07/17/23 Lynwood Music, MPT Physical Therapist with [facility] 336 [phone] office [phone] mobile phone

## 2023-07-20 ENCOUNTER — Telehealth: Payer: Self-pay | Admitting: *Deleted

## 2023-07-20 LAB — CULTURE, BLOOD (ROUTINE X 2)
Culture: NO GROWTH
Culture: NO GROWTH
Special Requests: ADEQUATE

## 2023-07-20 NOTE — Transitions of Care (Post Inpatient/ED Visit) (Signed)
   07/20/2023  Name: George Anthony MRN: [id number] DOB: 05-16-1950  Today's TOC FU Call Status: Today's TOC FU Call Status:: Unsuccessful Call (1st Attempt) Unsuccessful Call (1st Attempt) Date: 07/20/23  Attempted to reach the patient regarding the most recent Inpatient/ED visit. Spoke with spouse who reports pt is asleep, will attempt to speak with pt at a later date.  Follow Up Plan: Additional outreach attempts will be made to reach the patient to complete the Transitions of Care (Post Inpatient/ED visit) call.   Mliss Creed [facility], BSN RN Care Manager/ Transition of Care [place]/ [facility] [phone]

## 2023-07-21 ENCOUNTER — Telehealth: Payer: Self-pay | Admitting: *Deleted

## 2023-07-21 NOTE — Transitions of Care (Post Inpatient/ED Visit) (Signed)
   07/21/2023  Name: Caylan Schifano MRN: [id number] DOB: [birth year]  Today's TOC FU Call Status: Today's TOC FU Call Status:: Unsuccessful Call (2nd Attempt) Unsuccessful Call (2nd Attempt) Date: 07/21/23  Attempted to reach the patient regarding the most recent Inpatient/ED visit.  Follow Up Plan: Additional outreach attempts will be made to reach the patient to complete the Transitions of Care (Post Inpatient/ED visit) call.   Mliss Creed [facility], BSN RN Care Manager/ Transition of Care [place]/ [facility] [phone]

## 2023-07-22 ENCOUNTER — Telehealth: Payer: Self-pay | Admitting: *Deleted

## 2023-07-22 NOTE — Transitions of Care (Post Inpatient/ED Visit) (Signed)
 07/22/2023  Name: George Anthony MRN: [id number] DOB: 1950-05-15  Today's TOC FU Call Status: Today's TOC FU Call Status:: Successful TOC FU Call Completed Patient's Name and Date of Birth confirmed.  Transition Care Management Follow-up Telephone Call [facility] Type of Discharge: Inpatient Admission Primary Inpatient Discharge Diagnosis:: Cellulitis How have you been since you were released from the hospital?:  (eating, drinking well, ambulating without difficulty, spouse is supportive, provides wound care daily) Any questions or concerns?: No  Items Reviewed: Did you receive and understand the discharge instructions provided?: Yes Any new allergies since your discharge?: No Dietary orders reviewed?: Yes Type of Diet Ordered:: low sodium, heart healthy, carb modified Do you have support at home?: Yes People in Home [RPT]: spouse Name of Support/Comfort Primary Source: [name] Reviewed signs/ symptoms of infection, cellulitis Encouraged pt to call provider early on for change in health status/ symptoms Reviewed wound care from discharge summary, spouse is independent with daily wound care Encouraged pt to check CBG (has glucometer) and check BP   Medications Reviewed Today: Medications Reviewed Today     Reviewed by [name], RN (Registered Nurse) on 07/22/23 at 1438  Med List Status: <None>   Medication Order Taking? Sig Documenting Provider Last Dose Status Informant  acetaminophen  (TYLENOL ) 500 MG tablet [id number] Yes Take 1,300 mg by mouth every 6 (six) hours as needed for moderate pain. 650 mg tab [provider]  Active Self, Pharmacy Records  atorvastatin  (LIPITOR) 40 MG tablet [id number] Yes Take 1 tablet (40 mg total) by mouth at bedtime. Severa Rock HERO, FNP  Active Self, Pharmacy Records  benzonatate  (TESSALON  PERLES) 100 MG capsule [id number] Yes Take 2 capsules (200 mg total) by mouth 3 (three) times daily as needed.  Severa Rock HERO, FNP  Active Self, Pharmacy Records  doxycycline (VIBRA-TABS) 100 MG tablet [id number] Yes Take 1 tablet (100 mg total) by mouth 2 (two) times daily for 8 days. Maree Bracken D, DO  Active   ELIQUIS  5 MG TABS tablet [id number] Yes Take 1 tablet by mouth twice daily Rakes, Rock HERO, FNP  Active Self, Pharmacy Records  escitalopram  (LEXAPRO ) 10 MG tablet [id number] Yes Take 1 tablet by mouth once daily Rakes, Rock HERO, FNP  Active Self, Pharmacy Records  furosemide  (LASIX ) 20 MG tablet [id number] Yes Take 2 tablets (40 mg total) by mouth daily. [name], MD  Active Self, Pharmacy Records  lenvatinib  10 mg daily dose (LENVIMA , 10 MG DAILY DOSE,) capsule [id number] Yes Take 1 capsule (10 mg total) by mouth daily. Kandala, Hyndavi, MD  Active Self, Pharmacy Records  levothyroxine  (SYNTHROID ) 50 MCG tablet [id number] Yes Take 1 tablet (50 mcg total) by mouth daily before breakfast. [name], MD  Active Self, Pharmacy Records  nystatin (MYCOSTATIN/NYSTOP) powder [id number] Yes Apply 1 Application topically 2 (two) times daily. [provider]  Active Self, Pharmacy Records  prochlorperazine (COMPAZINE) 10 MG tablet [id number] Yes Take 10 mg by mouth every 6 (six) hours as needed. [provider]  Active Self, Pharmacy Records  terbinafine  (LAMISIL ) 1 % cream [id number] Yes Apply 1 Application topically 2 (two) times daily. [name], MD  Active Self, Pharmacy Records  Med List Note Teretha Renaee SAILOR, RPH-CPP 07/08/23 9044): Cabometyx filled at [facility] (Specialty)            Home Care and Equipment/Supplies: Were Home Health Services Ordered?: No Any new equipment or medical supplies ordered?: No  Functional Questionnaire: Do you  need assistance with bathing/showering or dressing?: Yes (shower chair) Do you need assistance with meal preparation?: Yes (spouse assists) Do you need assistance with eating?: No Do you have difficulty maintaining  continence: No Do you need assistance with getting out of bed/getting out of a chair/moving?: No Do you have difficulty managing or taking your medications?: Yes (spouse provides oversight)  Follow up appointments reviewed: PCP Follow-up appointment confirmed?: Yes Date of PCP follow-up appointment?: 07/23/23 Follow-up Provider: Rock Bruns  FNP  @ 1005 am Specialist Hospital Follow-up appointment confirmed?: Yes Date of Specialist follow-up appointment?: 07/27/23 Follow-Up Specialty Provider:: Dr. Davonna  @ 1 pm Do you need transportation to your follow-up appointment?: No Do you understand care options if your condition(s) worsen?: Yes-patient verbalized understanding  SDOH Interventions Today    Flowsheet Row Most Recent Value  SDOH Interventions   Food Insecurity Interventions Intervention Not Indicated  Housing Interventions Intervention Not Indicated  Transportation Interventions Intervention Not Indicated  Utilities Interventions Intervention Not Indicated    Mliss Creed [facility], BSN RN Care Manager/ Transition of Care [place]/ [facility] [phone]

## 2023-07-23 ENCOUNTER — Ambulatory Visit: Admitting: Family Medicine

## 2023-07-23 ENCOUNTER — Encounter: Payer: Self-pay | Admitting: Family Medicine

## 2023-07-23 ENCOUNTER — Ambulatory Visit: Payer: Self-pay | Admitting: Family Medicine

## 2023-07-23 VITALS — BP 126/84 | HR 86 | Temp 96.1°F | Ht 63.0 in | Wt 205.0 lb

## 2023-07-23 DIAGNOSIS — E1169 Type 2 diabetes mellitus with other specified complication: Secondary | ICD-10-CM

## 2023-07-23 DIAGNOSIS — N179 Acute kidney failure, unspecified: Secondary | ICD-10-CM | POA: Diagnosis not present

## 2023-07-23 DIAGNOSIS — E039 Hypothyroidism, unspecified: Secondary | ICD-10-CM | POA: Diagnosis not present

## 2023-07-23 DIAGNOSIS — L03116 Cellulitis of left lower limb: Secondary | ICD-10-CM

## 2023-07-23 DIAGNOSIS — C9110 Chronic lymphocytic leukemia of B-cell type not having achieved remission: Secondary | ICD-10-CM

## 2023-07-23 DIAGNOSIS — Z09 Encounter for follow-up examination after completed treatment for conditions other than malignant neoplasm: Secondary | ICD-10-CM | POA: Diagnosis not present

## 2023-07-23 DIAGNOSIS — L03115 Cellulitis of right lower limb: Secondary | ICD-10-CM | POA: Diagnosis not present

## 2023-07-23 DIAGNOSIS — C641 Malignant neoplasm of right kidney, except renal pelvis: Secondary | ICD-10-CM | POA: Diagnosis not present

## 2023-07-23 DIAGNOSIS — E1159 Type 2 diabetes mellitus with other circulatory complications: Secondary | ICD-10-CM

## 2023-07-23 DIAGNOSIS — R2243 Localized swelling, mass and lump, lower limb, bilateral: Secondary | ICD-10-CM | POA: Diagnosis not present

## 2023-07-23 DIAGNOSIS — D696 Thrombocytopenia, unspecified: Secondary | ICD-10-CM | POA: Diagnosis not present

## 2023-07-23 LAB — BAYER DCA HB A1C WAIVED: HB A1C (BAYER DCA - WAIVED): 6.5 % — ABNORMAL HIGH (ref 4.8–5.6)

## 2023-07-23 LAB — CBC WITH DIFFERENTIAL/PLATELET

## 2023-07-23 NOTE — Progress Notes (Addendum)
 Subjective:  Patient ID: George Anthony, male    DOB: 11-05-1950, 74 y.o.   MRN: [id number]  Patient Care Team: Severa Rock HERO, FNP as PCP - General (Family Medicine) Cindie Carlin POUR, DO as Consulting Physician (Internal Medicine) Ladora Ross Lacy Phebe, MD as Referring Physician (Optometry) Greissinger, April Macario, [place] (Hematology and Oncology)   Chief Complaint:  Hospitalization Follow-up (/07/15/2023 - 07/17/2023 (2 days)/[facility]- cellulitis - bilateral lower legs )   HPI: George Anthony is a 73 y.o. male presenting on 07/23/2023 for Hospitalization Follow-up (/07/15/2023 - 07/17/2023 (2 days)/[facility]- cellulitis - bilateral lower legs )  George Anthony is a 73 year old male who presents for follow-up after a recent hospital admission for cellulitis and kidney injury.  Lower extremity wounds and cellulitis - Hospitalized June 25th-27th for cellulitis and kidney injury - Ongoing lower extremity wounds with minimal improvement since discharge - Wounds appear unhealthy and are a source of concern for potential worsening and risk of limb loss - Currently using petroleum-based wraps at home, but perceives inadequate wound healing - No fever or chills since returning home - Near completion of a 10-day course of doxycycline - DVT studies during hospital stay negative.   Renal dysfunction - Acute kidney injury during recent hospitalization - No current symptoms suggestive of ongoing renal dysfunction reported  Diabetes mellitus management - Diabetic medications, including metformin , were discontinued during hospitalization - Not currently taking any diabetic medications  Hypertension management - Lisinopril  was discontinued during hospitalization - Not currently taking antihypertensive medication  Thyroid  disorder management - Continues to take thyroid  medication as prescribed  Oncologic history - Oncology follow-up scheduled for July  7th  Psychosocial concerns - Expresses frustration with frequent medical appointments and lack of respite from healthcare visits         07/23/2023   10:17 AM 07/22/2023    2:39 PM 07/15/2023    8:19 AM 04/09/2023    8:50 AM 01/08/2023    8:58 AM  Depression screen PHQ 2/9  Decreased Interest 1 0 0 0 0  Down, Depressed, Hopeless 0 0 1 0 0  PHQ - 2 Score 1 0 1 0 0  Altered sleeping 1   0   Tired, decreased energy 1   0   Change in appetite 1   0   Feeling bad or failure about yourself  0   0   Trouble concentrating 0   0   Moving slowly or fidgety/restless 0   0   Suicidal thoughts 0   0   PHQ-9 Score 4   0   Difficult doing work/chores Not difficult at all   Not difficult at all       07/23/2023   10:17 AM 07/22/2023    2:39 PM 04/09/2023    8:51 AM 01/08/2023    8:58 AM  GAD 7 : Generalized Anxiety Score  Nervous, Anxious, on Edge 0 0 0 0  Control/stop worrying 0 0 0 0  Worry too much - different things 0 0 0   Trouble relaxing 0 0 0   Restless 0 0 0   Easily annoyed or irritable 0 0 0   Afraid - awful might happen 0 0 0   Total GAD 7 Score 0 0 0   Anxiety Difficulty Not difficult at all Not difficult at all Not difficult at all         Relevant past medical, surgical, family, and social  history reviewed and updated as indicated.  Allergies and medications reviewed and updated. Data reviewed: Chart in Epic.   Past Medical History:  Diagnosis Date   Anxiety    Diabetes mellitus without complication (HCC)    Diabetic nephropathy associated with type 2 diabetes mellitus (HCC) 02/21/2021   DVT (deep venous thrombosis) (HCC)    Enlarged heart    Hyperlipidemia    Hypertension    Renal cell carcinoma [facility])     Past Surgical History:  Procedure Laterality Date   BIOPSY  09/16/2019   Procedure: BIOPSY;  Surgeon: Cindie Carlin POUR, DO;  [facility];  Service: Endoscopy;;   COLONOSCOPY WITH PROPOFOL  N/A 09/16/2019   Procedure: COLONOSCOPY WITH PROPOFOL ;   Surgeon: Cindie Carlin POUR, DO;  [facility];  Service: Endoscopy;  Laterality: N/A;  7:30am   POLYPECTOMY  09/16/2019   Procedure: POLYPECTOMY;  Surgeon: Cindie Carlin POUR, DO;  [facility];  Service: Endoscopy;;    Social History   Socioeconomic History   Marital status: Married    Spouse name: charlene   Number of children: 1   Years of education: Not on file   Highest education level: Not on file  Occupational History    Employer: [employer]   Occupation: Retired  Tobacco Use   Smoking status: Every Day    Current packs/day: 1.00    Average packs/day: 1 pack/day for 40.0 years (40.0 ttl pk-yrs)    Types: Cigarettes   Smokeless tobacco: Never  Vaping Use   Vaping status: Never Used  Substance and Sexual Activity   Alcohol use: Yes    Alcohol/week: 3.0 standard drinks of alcohol    Types: 3 Cans of beer per week    Comment: drinking a couple times a month for the last year.- used to drink 2 12 packs a week   Drug use: No   Sexual activity: Yes  Other Topics Concern   Not on file  Social History Narrative   Not on file   Social Drivers of Health   Financial Resource Strain: Low Risk  (04/09/2022)   Overall Financial Resource Strain (CARDIA)    Difficulty of Paying Living Expenses: Not hard at all  Food Insecurity: No Food Insecurity (07/22/2023)   Hunger Vital Sign    Worried About Running Out of Food in the Last Year: Never true    Ran Out of Food in the Last Year: Never true  Transportation Needs: No Transportation Needs (07/22/2023)   PRAPARE - Administrator, Civil Service (Medical): No    Lack of Transportation (Non-Medical): No  Physical Activity: Inactive (04/09/2022)   Exercise Vital Sign    Days of Exercise per Week: 0 days    Minutes of Exercise per Session: 0 min  Stress: No Stress Concern Present (04/09/2022)   [employer] of Occupational Health - Occupational Stress Questionnaire    Feeling of Stress : Not at  all  Social Connections: Moderately Isolated (07/15/2023)   Social Connection and Isolation Panel    Frequency of Communication with Friends and Family: More than three times a week    Frequency of Social Gatherings with Friends and Family: More than three times a week    Attends Religious Services: Never    Database administrator or Organizations: No    Attends Banker Meetings: Never    Marital Status: Married  Catering manager Violence: Not At Risk (07/22/2023)   Humiliation, Afraid, Rape,  and Kick questionnaire    Fear of Current or Ex-Partner: No    Emotionally Abused: No    Physically Abused: No    Sexually Abused: No    Outpatient Encounter Medications as of 07/23/2023  Medication Sig   acetaminophen  (TYLENOL ) 500 MG tablet Take 1,300 mg by mouth every 6 (six) hours as needed for moderate pain. 650 mg tab   atorvastatin  (LIPITOR) 40 MG tablet Take 1 tablet (40 mg total) by mouth at bedtime.   benzonatate  (TESSALON  PERLES) 100 MG capsule Take 2 capsules (200 mg total) by mouth 3 (three) times daily as needed.   doxycycline (VIBRA-TABS) 100 MG tablet Take 1 tablet (100 mg total) by mouth 2 (two) times daily for 8 days.   ELIQUIS  5 MG TABS tablet Take 1 tablet by mouth twice daily   escitalopram  (LEXAPRO ) 10 MG tablet Take 1 tablet by mouth once daily   furosemide  (LASIX ) 20 MG tablet Take 2 tablets (40 mg total) by mouth daily.   lenvatinib  10 mg daily dose (LENVIMA , 10 MG DAILY DOSE,) capsule Take 1 capsule (10 mg total) by mouth daily.   levothyroxine  (SYNTHROID ) 50 MCG tablet Take 1 tablet (50 mcg total) by mouth daily before breakfast.   nystatin (MYCOSTATIN/NYSTOP) powder Apply 1 Application topically 2 (two) times daily.   prochlorperazine (COMPAZINE) 10 MG tablet Take 10 mg by mouth every 6 (six) hours as needed.   terbinafine  (LAMISIL ) 1 % cream Apply 1 Application topically 2 (two) times daily.   No facility-administered encounter medications on file as of  07/23/2023.    No Known Allergies  Pertinent ROS per HPI, otherwise unremarkable      Objective:  BP 126/84   Pulse 86   Temp (!) 96.1 F (35.6 C)   Ht 5' 3 (1.6 m)   Wt 205 lb (93 kg)   SpO2 93%   BMI 36.31 kg/m    Wt Readings from Last 3 Encounters:  07/23/23 205 lb (93 kg)  07/15/23 213 lb 10 oz (96.9 kg)  07/15/23 217 lb 6 oz (98.6 kg)    Physical Exam Vitals and nursing note reviewed.  Constitutional:      General: He is not in acute distress.    Appearance: He is obese. He is ill-appearing. He is not toxic-appearing or diaphoretic.  HENT:     Head: Normocephalic and atraumatic.     Mouth/Throat:     Dentition: Abnormal dentition.  Cardiovascular:     Rate and Rhythm: Normal rate and regular rhythm.     Heart sounds: Normal heart sounds.  Pulmonary:     Effort: Pulmonary effort is normal.     Breath sounds: Decreased breath sounds and rhonchi (scattered, clears with cough) present.  Abdominal:     General: Abdomen is protuberant. There is distension.  Musculoskeletal:     Cervical back: Neck supple.     Right lower leg: 1+ Pitting Edema present.     Left lower leg: 1+ Pitting Edema present.  Skin:    General: Skin is warm and dry.     Capillary Refill: Capillary refill takes less than 2 seconds.     Comments: BLE with swelling, open wounds, and erythema. Images below.   Neurological:     Mental Status: He is alert.      Results for orders placed or performed during the hospital encounter of 07/15/23  MRSA Next Gen by PCR, Nasal   Collection Time: 07/15/23 11:30 AM   Specimen: Nasal Mucosa;  Nasal Swab  Result Value Ref Range   MRSA by PCR Next Gen NOT DETECTED NOT DETECTED  Glucose, capillary   Collection Time: 07/15/23 11:43 AM  Result Value Ref Range   Glucose-Capillary 212 (H) 70 - 99 mg/dL  Culture, blood (Routine X 2) w Reflex to ID Panel   Collection Time: 07/15/23 12:21 PM   Specimen: BLOOD  Result Value Ref Range   Specimen Description       BLOOD BLOOD RIGHT ARM Performed at [facility], [address], [place], [place] [address]    Special Requests      BOTTLES DRAWN AEROBIC AND ANAEROBIC Blood Culture adequate volume Performed at [facility], [address], [place], [place] [address]    Culture      NO GROWTH 5 DAYS Performed at [facility], [address] [place], [place] [address]    Report Status 07/20/2023 FINAL   Culture, blood (Routine X 2) w Reflex to ID Panel   Collection Time: 07/15/23 12:22 PM   Specimen: BLOOD  Result Value Ref Range   Specimen Description      BLOOD BLOOD LEFT HAND Performed at [facility], [address] [place], [place] [address]    Special Requests      BOTTLES DRAWN AEROBIC AND ANAEROBIC Blood Culture results may not be optimal due to an inadequate volume of blood received in culture bottles Performed at [facility], [address] [place], [place] [address]    Culture      NO GROWTH 5 DAYS Performed at [facility], [address], [place], [place] [address]    Report Status 07/20/2023 FINAL   Comprehensive metabolic panel   Collection Time: 07/15/23 12:22 PM  Result Value Ref Range   Sodium 136 135 - 145 mmol/L   Potassium 4.6 3.5 - 5.1 mmol/L   Chloride 95 (L) 98 - 111 mmol/L   CO2 29 22 - 32 mmol/L   Glucose, Bld 208 (H) 70 - 99 mg/dL   BUN 25 (H) 8 - 23 mg/dL   Creatinine, Ser 8.34 (H) 0.61 - 1.24 mg/dL   Calcium  8.5 (L) 8.9 - 10.3 mg/dL   Total Protein 6.8 6.5 - 8.1 g/dL   Albumin 3.7 3.5 - 5.0 g/dL   AST 20 15 - 41 U/L   ALT 17 0 - 44 U/L   Alkaline Phosphatase 83 38 - 126 U/L   Total Bilirubin 0.6 0.0 - 1.2 mg/dL   GFR, Estimated 44 (L) >60 mL/min   Anion gap 12 5 - 15  CBC   Collection Time: 07/15/23 12:22 PM  Result Value Ref Range   WBC 26.7 (H) 4.0 - 10.5 K/uL   RBC 4.10 (L) 4.22 - 5.81 MIL/uL   Hemoglobin 12.7 (L) 13.0 - 17.0 g/dL   HCT 59.1 60.9 - 47.9 %   MCV 99.5 80.0 - 100.0 fL   MCH 31.0 26.0 - 34.0 pg   MCHC 31.1 30.0 -  36.0 g/dL   RDW 84.9 88.4 - 84.4 %   Platelets 142 (L) 150 - 400 K/uL   nRBC 0.0 0.0 - 0.2 %  Magnesium   Collection Time: 07/15/23 12:22 PM  Result Value Ref Range   Magnesium 1.8 1.7 - 2.4 mg/dL  Lactic acid, plasma   Collection Time: 07/15/23 12:22 PM  Result Value Ref Range   Lactic Acid, Venous 1.0 0.5 - 1.9 mmol/L  Glucose, capillary   Collection Time: 07/15/23  9:02 PM  Result Value Ref Range   Glucose-Capillary 139 (H) 70 - 99 mg/dL  Magnesium   Collection Time: 07/16/23  4:22 AM  Result Value Ref Range   Magnesium 1.7 1.7 - 2.4 mg/dL  Basic metabolic panel   Collection Time: 07/16/23  4:22 AM  Result Value Ref Range   Sodium 137 135 - 145 mmol/L   Potassium 4.2 3.5 - 5.1 mmol/L   Chloride 97 (L) 98 - 111 mmol/L   CO2 30 22 - 32 mmol/L   Glucose, Bld 145 (H) 70 - 99 mg/dL   BUN 21 8 - 23 mg/dL   Creatinine, Ser 8.63 (H) 0.61 - 1.24 mg/dL   Calcium  8.1 (L) 8.9 - 10.3 mg/dL   GFR, Estimated 55 (L) >60 mL/min   Anion gap 10 5 - 15  CBC   Collection Time: 07/16/23  4:22 AM  Result Value Ref Range   WBC 25.8 (H) 4.0 - 10.5 K/uL   RBC 4.07 (L) 4.22 - 5.81 MIL/uL   Hemoglobin 12.7 (L) 13.0 - 17.0 g/dL   HCT 59.1 60.9 - 47.9 %   MCV 100.2 (H) 80.0 - 100.0 fL   MCH 31.2 26.0 - 34.0 pg   MCHC 31.1 30.0 - 36.0 g/dL   RDW 84.9 88.4 - 84.4 %   Platelets 142 (L) 150 - 400 K/uL   nRBC 0.0 0.0 - 0.2 %  Glucose, capillary   Collection Time: 07/16/23  7:17 AM  Result Value Ref Range   Glucose-Capillary 136 (H) 70 - 99 mg/dL   Comment 1 Notify RN    Comment 2 Document in Chart   Glucose, capillary   Collection Time: 07/16/23 11:59 AM  Result Value Ref Range   Glucose-Capillary 123 (H) 70 - 99 mg/dL   Comment 1 Notify RN    Comment 2 Document in Chart   Glucose, capillary   Collection Time: 07/16/23  4:44 PM  Result Value Ref Range   Glucose-Capillary 103 (H) 70 - 99 mg/dL   Comment 1 Notify RN    Comment 2 Document in Chart   Glucose, capillary   Collection Time:  07/16/23  9:38 PM  Result Value Ref Range   Glucose-Capillary 151 (H) 70 - 99 mg/dL  Basic metabolic panel with GFR   Collection Time: 07/17/23  3:35 AM  Result Value Ref Range   Sodium 134 (L) 135 - 145 mmol/L   Potassium 4.2 3.5 - 5.1 mmol/L   Chloride 92 (L) 98 - 111 mmol/L   CO2 32 22 - 32 mmol/L   Glucose, Bld 123 (H) 70 - 99 mg/dL   BUN 24 (H) 8 - 23 mg/dL   Creatinine, Ser 8.32 (H) 0.61 - 1.24 mg/dL   Calcium  8.0 (L) 8.9 - 10.3 mg/dL   GFR, Estimated 43 (L) >60 mL/min   Anion gap 10 5 - 15  Magnesium   Collection Time: 07/17/23  3:35 AM  Result Value Ref Range   Magnesium 1.6 (L) 1.7 - 2.4 mg/dL  CBC   Collection Time: 07/17/23  3:35 AM  Result Value Ref Range   WBC 26.2 (H) 4.0 - 10.5 K/uL   RBC 3.90 (L) 4.22 - 5.81 MIL/uL   Hemoglobin 12.1 (L) 13.0 - 17.0 g/dL   HCT 60.9 60.9 - 47.9 %   MCV 100.0 80.0 - 100.0 fL   MCH 31.0 26.0 - 34.0 pg   MCHC 31.0 30.0 - 36.0 g/dL   RDW 85.3 88.4 - 84.4 %   Platelets  130 (L) 150 - 400 K/uL   nRBC 0.1 0.0 - 0.2 %  Glucose, capillary   Collection Time: 07/17/23  7:15 AM  Result Value Ref Range   Glucose-Capillary 129 (H) 70 - 99 mg/dL   Comment 1 Notify RN    Comment 2 Document in Chart   Glucose, capillary   Collection Time: 07/17/23 11:29 AM  Result Value Ref Range   Glucose-Capillary 132 (H) 70 - 99 mg/dL   Comment 1 Notify RN    Comment 2 Document in Chart        Pertinent labs & imaging results that were available during my care of the patient were reviewed by me and considered in my medical decision making.  Assessment & Plan:  George Anthony was seen today for hospitalization follow-up.  Diagnoses and all orders for this visit:  Bilateral cellulitis of lower leg -     CBC with Differential/Platelet  Localized swelling of both lower legs -     CMP14+EGFR -     CBC with Differential/Platelet  Type 2 diabetes mellitus with other specified complication, without long-term current use of insulin  (HCC) -     CMP14+EGFR -      CBC with Differential/Platelet -     Bayer DCA Hb A1c Waived  Hypertension associated with type 2 diabetes mellitus (HCC) -     CMP14+EGFR -     CBC with Differential/Platelet  AKI (acute kidney injury) (HCC) -     CMP14+EGFR -     CBC with Differential/Platelet  Thrombocytopenia (HCC) -     CMP14+EGFR -     CBC with Differential/Platelet  Hypomagnesemia -     Magnesium  Acquired hypothyroidism -     Thyroid  Panel With [facility] discharge follow-up -     CMP14+EGFR -     CBC with Differential/Platelet -     Bayer DCA Hb A1c Waived -     Magnesium -     Thyroid  Panel With TSH  Renal cell carcinoma of right kidney (HCC) -     CMP14+EGFR -     CBC with Differential/Platelet  CLL (chronic lymphocytic leukemia) (HCC) -     CBC with Differential/Platelet  Today's visit was for Transitional Care Management.  The patient was discharged from [facility] on 07/17/2023 with a primary diagnosis of cellulitis.   Contact with the patient and/or caregiver, by a clinical staff member, was made on 07/22/2023 and was documented as a telephone encounter within the EMR.  Through chart review and discussion with the patient I have determined that management of their condition is of high complexity.   Medications were reconciled.       Cellulitis He was recently hospitalized for cellulitis and is currently on a course of doxycycline, which is nearing completion. The cellulitis is not improving as expected, and there is concern for venous insufficiency contributing to poor wound healing. - Apply Unna boots today for compression and medication to aid healing and reduce swelling - Schedule follow-up in 5-7 days for re-evaluation and rewrapping - Continue doxycycline until completion - Consider referral to wound care if no improvement with Unna boots  Acute Kidney Injury He experienced an acute kidney injury during the recent hospitalization, likely due to dehydration and diabetic  medications. Diabetic medications, including metformin , have been held to prevent further kidney damage. - Order lab work to assess kidney function - Hold diabetic medications until kidney function normalizes  Diabetes Mellitus Diabetes management is currently on  hold due to low blood sugar levels and the recent acute kidney injury. - Check A1c level today - Monitor blood sugar levels  Hypertension Lisinopril  was discontinued due to the acute kidney injury. Blood pressure management will be reassessed once kidney function stabilizes. - Monitor blood pressure - Reassess need for antihypertensive medication after kidney function stabilizes  Cancer He has an ongoing cancer diagnosis and is under the care of an oncologist. He is scheduled for a follow-up oncology appointment on July 7th, which will include a scan. He is considering the continuation of active cancer treatment and is advised to discuss treatment preferences with the oncology team. - Attend oncology follow-up appointment on July 7th          Continue all other maintenance medications.  Follow up plan: Return 5-7 days for Unna boots.   Continue healthy lifestyle choices, including diet (rich in fruits, vegetables, and lean proteins, and low in salt and simple carbohydrates) and exercise (at least 30 minutes of moderate physical activity daily).  Educational handout given for unna boots, cellulitis   The above assessment and management plan was discussed with the patient. The patient verbalized understanding of and has agreed to the management plan. Patient is aware to call the clinic if they develop any new symptoms or if symptoms persist or worsen. Patient is aware when to return to the clinic for a follow-up visit. Patient educated on when it is appropriate to go to the emergency department.   Rosaline Bruns, FNP-C [facility] [phone]

## 2023-07-24 ENCOUNTER — Encounter: Payer: Self-pay | Admitting: Oncology

## 2023-07-24 LAB — CBC WITH DIFFERENTIAL/PLATELET
Basophils Absolute: 0.2 x10E3/uL (ref 0.0–0.2)
Basos: 0 %
EOS (ABSOLUTE): 0.3 x10E3/uL (ref 0.0–0.4)
Eos: 1 %
Hematocrit: 40.5 % (ref 37.5–51.0)
Hemoglobin: 13.2 g/dL (ref 13.0–17.7)
Immature Grans (Abs): 0 x10E3/uL (ref 0.0–0.1)
Immature Granulocytes: 0 %
Lymphocytes Absolute: 29 x10E3/uL — ABNORMAL HIGH (ref 0.7–3.1)
Lymphs: 80 %
MCH: 31.2 pg (ref 26.6–33.0)
MCHC: 32.6 g/dL (ref 31.5–35.7)
MCV: 96 fL (ref 79–97)
Monocytes Absolute: 0.6 x10E3/uL (ref 0.1–0.9)
Monocytes: 2 %
Neutrophils Absolute: 6 x10E3/uL (ref 1.4–7.0)
Neutrophils: 17 %
Platelets: 213 x10E3/uL (ref 150–450)
RBC: 4.23 x10E6/uL (ref 4.14–5.80)
RDW: 14 % (ref 11.6–15.4)
WBC: 36.1 x10E3/uL (ref 3.4–10.8)

## 2023-07-24 LAB — THYROID PANEL WITH TSH
Free Thyroxine Index: 0.9 — ABNORMAL LOW (ref 1.2–4.9)
T3 Uptake Ratio: 25 % (ref 24–39)
T4, Total: 3.4 ug/dL — ABNORMAL LOW (ref 4.5–12.0)
TSH: 70.2 u[IU]/mL — ABNORMAL HIGH (ref 0.450–4.500)

## 2023-07-24 LAB — CMP14+EGFR
ALT: 15 IU/L (ref 0–44)
AST: 15 IU/L (ref 0–40)
Albumin: 4.1 g/dL (ref 3.8–4.8)
Alkaline Phosphatase: 108 IU/L (ref 44–121)
BUN/Creatinine Ratio: 16 (ref 10–24)
BUN: 26 mg/dL (ref 8–27)
Bilirubin Total: 0.4 mg/dL (ref 0.0–1.2)
CO2: 26 mmol/L (ref 20–29)
Calcium: 9 mg/dL (ref 8.6–10.2)
Chloride: 89 mmol/L — ABNORMAL LOW (ref 96–106)
Creatinine, Ser: 1.62 mg/dL — ABNORMAL HIGH (ref 0.76–1.27)
Globulin, Total: 2.2 g/dL (ref 1.5–4.5)
Glucose: 146 mg/dL — ABNORMAL HIGH (ref 70–99)
Potassium: 4.1 mmol/L (ref 3.5–5.2)
Sodium: 132 mmol/L — ABNORMAL LOW (ref 134–144)
Total Protein: 6.3 g/dL (ref 6.0–8.5)
eGFR: 45 mL/min/1.73 — ABNORMAL LOW (ref >59)

## 2023-07-24 LAB — MAGNESIUM: Magnesium: 1.2 mg/dL — ABNORMAL LOW (ref 1.6–2.3)

## 2023-07-27 ENCOUNTER — Inpatient Hospital Stay: Attending: Oncology | Admitting: Oncology

## 2023-07-27 VITALS — BP 120/77 | HR 87 | Temp 97.5°F | Resp 18 | Wt 203.5 lb

## 2023-07-27 DIAGNOSIS — C9111 Chronic lymphocytic leukemia of B-cell type in remission: Secondary | ICD-10-CM | POA: Insufficient documentation

## 2023-07-27 DIAGNOSIS — Z79899 Other long term (current) drug therapy: Secondary | ICD-10-CM | POA: Insufficient documentation

## 2023-07-27 DIAGNOSIS — L304 Erythema intertrigo: Secondary | ICD-10-CM | POA: Diagnosis not present

## 2023-07-27 DIAGNOSIS — F17210 Nicotine dependence, cigarettes, uncomplicated: Secondary | ICD-10-CM | POA: Diagnosis not present

## 2023-07-27 DIAGNOSIS — E039 Hypothyroidism, unspecified: Secondary | ICD-10-CM | POA: Insufficient documentation

## 2023-07-27 DIAGNOSIS — C641 Malignant neoplasm of right kidney, except renal pelvis: Secondary | ICD-10-CM | POA: Diagnosis not present

## 2023-07-27 DIAGNOSIS — Z86718 Personal history of other venous thrombosis and embolism: Secondary | ICD-10-CM | POA: Diagnosis not present

## 2023-07-27 DIAGNOSIS — C7800 Secondary malignant neoplasm of unspecified lung: Secondary | ICD-10-CM | POA: Diagnosis not present

## 2023-07-27 DIAGNOSIS — C787 Secondary malignant neoplasm of liver and intrahepatic bile duct: Secondary | ICD-10-CM | POA: Diagnosis not present

## 2023-07-27 DIAGNOSIS — L03116 Cellulitis of left lower limb: Secondary | ICD-10-CM | POA: Insufficient documentation

## 2023-07-27 DIAGNOSIS — L03119 Cellulitis of unspecified part of limb: Secondary | ICD-10-CM | POA: Diagnosis not present

## 2023-07-27 DIAGNOSIS — C9110 Chronic lymphocytic leukemia of B-cell type not having achieved remission: Secondary | ICD-10-CM | POA: Diagnosis not present

## 2023-07-27 DIAGNOSIS — Z79890 Hormone replacement therapy (postmenopausal): Secondary | ICD-10-CM | POA: Diagnosis not present

## 2023-07-27 DIAGNOSIS — Z7984 Long term (current) use of oral hypoglycemic drugs: Secondary | ICD-10-CM | POA: Diagnosis not present

## 2023-07-27 DIAGNOSIS — Z720 Tobacco use: Secondary | ICD-10-CM

## 2023-07-27 DIAGNOSIS — L03115 Cellulitis of right lower limb: Secondary | ICD-10-CM | POA: Diagnosis not present

## 2023-07-27 DIAGNOSIS — Z905 Acquired absence of kidney: Secondary | ICD-10-CM | POA: Diagnosis not present

## 2023-07-27 MED ORDER — LEVOTHYROXINE SODIUM 50 MCG PO TABS: 100.0000 ug | ORAL_TABLET | Freq: Every day | ORAL | 2 refills | Status: DC

## 2023-07-28 ENCOUNTER — Encounter: Payer: Self-pay | Admitting: Family Medicine

## 2023-07-28 ENCOUNTER — Encounter: Payer: Self-pay | Admitting: Oncology

## 2023-07-28 ENCOUNTER — Ambulatory Visit: Admitting: Family Medicine

## 2023-07-28 VITALS — BP 124/84 | HR 93 | Temp 96.8°F | Ht 63.0 in | Wt 203.0 lb

## 2023-07-28 DIAGNOSIS — E1121 Type 2 diabetes mellitus with diabetic nephropathy: Secondary | ICD-10-CM

## 2023-07-28 DIAGNOSIS — L03115 Cellulitis of right lower limb: Secondary | ICD-10-CM

## 2023-07-28 DIAGNOSIS — R2243 Localized swelling, mass and lump, lower limb, bilateral: Secondary | ICD-10-CM

## 2023-07-28 DIAGNOSIS — L03116 Cellulitis of left lower limb: Secondary | ICD-10-CM

## 2023-07-28 MED ORDER — GABAPENTIN 100 MG PO CAPS: 100.0000 mg | ORAL_CAPSULE | Freq: Three times a day (TID) | ORAL | 3 refills | Status: DC | PRN

## 2023-07-28 NOTE — Assessment & Plan Note (Signed)
 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 Significantly improved at this time Completed oral fluconazole  course  - Discontinued axitinib   Resolved at this time

## 2023-07-28 NOTE — Assessment & Plan Note (Signed)
 Started after discontinuing axitinib .  Unlikely medication side effect. Was recently admitted to the hospital for the same and complete her antibiotics. Following with primary care and is on Unna boot.  Unable to examine the legs today because of the wrapping.  - Continue to follow with primary care closely

## 2023-07-28 NOTE — Progress Notes (Signed)
 Subjective:  Patient ID: George Anthony, male    DOB: 1950-07-24, 73 y.o.   MRN: [id number]  Patient Care Team: Severa Rock HERO, FNP as PCP - General (Family Medicine) Cindie Carlin POUR, DO as Consulting Physician (Internal Medicine) Ladora Ross Lacy Phebe, MD as Referring Physician (Optometry) Greissinger, April Macario, PA-C (Hematology and Oncology)   Chief Complaint:  ignacia boot (Re check )   HPI: George Anthony is a 73 y.o. male presenting on 07/28/2023 for ignacia boot (Re check )   George Anthony is a 73 year old male with neuropathy who presents with an open wound and swelling on his leg.  Lower extremity wound and edema - Open wound on leg with associated swelling - Flaking skin attributed to xerosis (dry skin) - Completed a course of antibiotics with improvement in infection - Currently no erythema in the wound area - Wound remains open with persistent swelling - Utilizing Una boots for wound care for six days; one wrap was too tight and the other too loose, causing slippage - Expresses concern about the need for another round of Una boots - Desires to take a bath instead of continuing sponge baths  Neuropathic pain - Nerve pain in the leg due to neuropathy - Seeking relief for neuropathic pain          Relevant past medical, surgical, family, and social history reviewed and updated as indicated.  Allergies and medications reviewed and updated. Data reviewed: Chart in Epic.   Past Medical History:  Diagnosis Date   Anxiety    Diabetes mellitus without complication (HCC)    Diabetic nephropathy associated with type 2 diabetes mellitus (HCC) 02/21/2021   DVT (deep venous thrombosis) (HCC)    Enlarged heart    Hyperlipidemia    Hypertension    Renal cell carcinoma [facility])     Past Surgical History:  Procedure Laterality Date   BIOPSY  09/16/2019   Procedure: BIOPSY;  Surgeon: Cindie Carlin POUR, DO;  Location: [facility];  Service: Endoscopy;;   COLONOSCOPY  WITH PROPOFOL  N/A 09/16/2019   Procedure: COLONOSCOPY WITH PROPOFOL ;  Surgeon: Cindie Carlin POUR, DO;  Location: [facility];  Service: Endoscopy;  Laterality: N/A;  7:30am   POLYPECTOMY  09/16/2019   Procedure: POLYPECTOMY;  Surgeon: Cindie Carlin POUR, DO;  Location: [facility];  Service: Endoscopy;;    Social History   Socioeconomic History   Marital status: Married    Spouse name: charlene   Number of children: 1   Years of education: Not on file   Highest education level: Not on file  Occupational History    Employer: [employer]   Occupation: Retired  Tobacco Use   Smoking status: Every Day    Current packs/day: 1.00    Average packs/day: 1 pack/day for 40.0 years (40.0 ttl pk-yrs)    Types: Cigarettes   Smokeless tobacco: Never  Vaping Use   Vaping status: Never Used  Substance and Sexual Activity   Alcohol use: Yes    Alcohol/week: 3.0 standard drinks of alcohol    Types: 3 Cans of beer per week    Comment: drinking a couple times a month for the last year.- used to drink 2 12 packs a week   Drug use: No   Sexual activity: Yes  Other Topics Concern   Not on file  Social History Narrative   Not on file   Social Drivers of Health   Financial Resource Strain:  Low Risk  (04/09/2022)   Overall Financial Resource Strain (CARDIA)    Difficulty of Paying Living Expenses: Not hard at all  Food Insecurity: No Food Insecurity (07/22/2023)   Hunger Vital Sign    Worried About Running Out of Food in the Last Year: Never true    Ran Out of Food in the Last Year: Never true  Transportation Needs: No Transportation Needs (07/22/2023)   PRAPARE - Administrator, Civil Service (Medical): No    Lack of Transportation (Non-Medical): No  Physical Activity: Inactive (04/09/2022)   Exercise Vital Sign    Days of Exercise per Week: 0 days    Minutes of Exercise per Session: 0 min  Stress: No Stress Concern Present (04/09/2022)   [employer] of Occupational  Health - Occupational Stress Questionnaire    Feeling of Stress : Not at all  Social Connections: Moderately Isolated (07/15/2023)   Social Connection and Isolation Panel    Frequency of Communication with Friends and Family: More than three times a week    Frequency of Social Gatherings with Friends and Family: More than three times a week    Attends Religious Services: Never    Database administrator or Organizations: No    Attends Banker Meetings: Never    Marital Status: Married  Catering manager Violence: Not At Risk (07/22/2023)   Humiliation, Afraid, Rape, and Kick questionnaire    Fear of Current or Ex-Partner: No    Emotionally Abused: No    Physically Abused: No    Sexually Abused: No    Outpatient Encounter Medications as of 07/28/2023  Medication Sig   acetaminophen  (TYLENOL ) 500 MG tablet Take 1,300 mg by mouth every 6 (six) hours as needed for moderate pain. 650 mg tab   atorvastatin  (LIPITOR) 40 MG tablet Take 1 tablet (40 mg total) by mouth at bedtime.   benzonatate  (TESSALON  PERLES) 100 MG capsule Take 2 capsules (200 mg total) by mouth 3 (three) times daily as needed.   ELIQUIS  5 MG TABS tablet Take 1 tablet by mouth twice daily   escitalopram  (LEXAPRO ) 10 MG tablet Take 1 tablet by mouth once daily   furosemide  (LASIX ) 20 MG tablet Take 2 tablets (40 mg total) by mouth daily.   gabapentin  (NEURONTIN ) 100 MG capsule Take 1 capsule (100 mg total) by mouth 3 (three) times daily as needed.   lenvatinib  10 mg daily dose (LENVIMA , 10 MG DAILY DOSE,) capsule Take 1 capsule (10 mg total) by mouth daily.   levothyroxine  (SYNTHROID ) 50 MCG tablet Take 2 tablets (100 mcg total) by mouth daily before breakfast.   nystatin (MYCOSTATIN/NYSTOP) powder Apply 1 Application topically 2 (two) times daily.   prochlorperazine (COMPAZINE) 10 MG tablet Take 10 mg by mouth every 6 (six) hours as needed.   terbinafine  (LAMISIL ) 1 % cream Apply 1 Application topically 2 (two) times  daily.   No facility-administered encounter medications on file as of 07/28/2023.    No Known Allergies  Pertinent ROS per HPI, otherwise unremarkable      Objective:  BP 124/84   Pulse 93   Temp (!) 96.8 F (36 C)   Ht 5' 3 (1.6 m)   Wt 203 lb (92.1 kg)   SpO2 92%   BMI 35.96 kg/m    Wt Readings from Last 3 Encounters:  07/28/23 203 lb (92.1 kg)  07/27/23 203 lb 8 oz (92.3 kg)  07/23/23 205 lb (93 kg)    Physical Exam  Vitals and nursing note reviewed.  Constitutional:      General: He is not in acute distress.    Appearance: Normal appearance. He is obese. He is ill-appearing (chronically ill). He is not toxic-appearing or diaphoretic.  HENT:     Nose: Nose normal.     Mouth/Throat:     Mouth: Mucous membranes are moist.  Eyes:     Conjunctiva/sclera: Conjunctivae normal.     Pupils: Pupils are equal, round, and reactive to light.  Cardiovascular:     Rate and Rhythm: Normal rate and regular rhythm.     Heart sounds: Normal heart sounds.  Pulmonary:     Effort: Pulmonary effort is normal.     Breath sounds: Rhonchi (clears with cough, minimal) present.  Musculoskeletal:     Right lower leg: Edema present.     Left lower leg: Edema present.  Skin:    General: Skin is warm and dry.     Capillary Refill: Capillary refill takes less than 2 seconds.     Comments: BLE with swelling, open wounds, and erythema. Images below.  Improved.  Neurological:     General: No focal deficit present.     Mental Status: He is alert and oriented to person, place, and time.  Psychiatric:        Mood and Affect: Mood normal.        Behavior: Behavior normal.        Thought Content: Thought content normal.        Judgment: Judgment normal.       Results for orders placed or performed in visit on 07/23/23  Bayer DCA Hb A1c Waived   Collection Time: 07/23/23 10:52 AM  Result Value Ref Range   HB A1C (BAYER DCA - WAIVED) 6.5 (H) 4.8 - 5.6 %  CMP14+EGFR   Collection Time:  07/23/23 10:54 AM  Result Value Ref Range   Glucose 146 (H) 70 - 99 mg/dL   BUN 26 8 - 27 mg/dL   Creatinine, Ser 8.37 (H) 0.76 - 1.27 mg/dL   eGFR 45 (L) >40 fO/fpw/8.26   BUN/Creatinine Ratio 16 10 - 24   Sodium 132 (L) 134 - 144 mmol/L   Potassium 4.1 3.5 - 5.2 mmol/L   Chloride 89 (L) 96 - 106 mmol/L   CO2 26 20 - 29 mmol/L   Calcium  9.0 8.6 - 10.2 mg/dL   Total Protein 6.3 6.0 - 8.5 g/dL   Albumin 4.1 3.8 - 4.8 g/dL   Globulin, Total 2.2 1.5 - 4.5 g/dL   Bilirubin Total 0.4 0.0 - 1.2 mg/dL   Alkaline Phosphatase 108 44 - 121 IU/L   AST 15 0 - 40 IU/L   ALT 15 0 - 44 IU/L  CBC with Differential/Platelet   Collection Time: 07/23/23 10:54 AM  Result Value Ref Range   WBC 36.1 (HH) 3.4 - 10.8 x10E3/uL   RBC 4.23 4.14 - 5.80 x10E6/uL   Hemoglobin 13.2 13.0 - 17.7 g/dL   Hematocrit 59.4 62.4 - 51.0 %   MCV 96 79 - 97 fL   MCH 31.2 26.6 - 33.0 pg   MCHC 32.6 31.5 - 35.7 g/dL   RDW 85.9 88.3 - 84.5 %   Platelets 213 150 - 450 x10E3/uL   Neutrophils 17 Not Estab. %   Lymphs 80 Not Estab. %   Monocytes 2 Not Estab. %   Eos 1 Not Estab. %   Basos 0 Not Estab. %   Neutrophils Absolute 6.0 1.4 -  7.0 x10E3/uL   Lymphocytes Absolute 29.0 (H) 0.7 - 3.1 x10E3/uL   Monocytes Absolute 0.6 0.1 - 0.9 x10E3/uL   EOS (ABSOLUTE) 0.3 0.0 - 0.4 x10E3/uL   Basophils Absolute 0.2 0.0 - 0.2 x10E3/uL   Immature Granulocytes 0 Not Estab. %   Immature Grans (Abs) 0.0 0.0 - 0.1 x10E3/uL   Hematology Comments: Note:   Magnesium    Collection Time: 07/23/23 10:54 AM  Result Value Ref Range   Magnesium  1.2 (L) 1.6 - 2.3 mg/dL  Thyroid  Panel With TSH   Collection Time: 07/23/23 10:54 AM  Result Value Ref Range   TSH 70.200 (H) 0.450 - 4.500 uIU/mL   T4, Total 3.4 (L) 4.5 - 12.0 ug/dL   T3 Uptake Ratio 25 24 - 39 %   Free Thyroxine Index 0.9 (L) 1.2 - 4.9       Pertinent labs & imaging results that were available during my care of the patient were reviewed by me and considered in my medical  decision making.  Assessment & Plan:  Skip was seen today for [employer].  Diagnoses and all orders for this visit:  Diabetic nephropathy associated with type 2 diabetes mellitus (HCC) -     gabapentin  (NEURONTIN ) 100 MG capsule; Take 1 capsule (100 mg total) by mouth 3 (three) times daily as needed.  Bilateral cellulitis of lower leg Localized swelling of both lower legs Unna boots applied       Leg Ulcers Leg ulcers are improving with reduced erythema and edema, indicating resolution of infection. Desquamation is noted as part of the healing process. Ulcers remain open, necessitating continued treatment with Una boots, which provide antibiotic properties. No additional oral antibiotics are required unless condition deteriorates. - Apply Una boots for six more days - Schedule follow-up in one week for [facility] boot removal  Neuropathic Pain Neuropathic pain likely related to existing neuropathy. Gabapentin  is considered for management, to be used only as needed to minimize medication use. - Prescribe gabapentin  as needed, up to three times daily - Provide a ten-day supply of gabapentin           Continue all other maintenance medications.  Follow up plan: Return in 1 week (on 08/04/2023), or if symptoms worsen or fail to improve, for [employer]   Continue healthy lifestyle choices, including diet (rich in fruits, vegetables, and lean proteins, and low in salt and simple carbohydrates) and exercise (at least 30 minutes of moderate physical activity daily).  Educational handout given for unna boots  The above assessment and management plan was discussed with the patient. The patient verbalized understanding of and has agreed to the management plan. Patient is aware to call the clinic if they develop any new symptoms or if symptoms persist or worsen. Patient is aware when to return to the clinic for a follow-up visit. Patient educated on when it is appropriate to go to the emergency  department.   Rosaline Bruns, FNP-C [facility] [phone]

## 2023-07-28 NOTE — Assessment & Plan Note (Signed)
 Patient has metastatic renal cell carcinoma with biopsy-proven liver metastasis and lung metastasis. Oncology history as below Started on pembrolizumab  every 6 weeks and axitinib  twice daily in May 2024.  Axitinib  was started later in July 2024 due to problems with access. Held Axitinib  for malignant intertrigo in 06/25 Last imaging was in March 2025 with no evidence of progression Recent CT 06/25 with stable disease  - Continue pembrolizumab  infusions every 6 weeks.   - Continue lenvatinib  10 mg daily last week.  Will increase the dose if patient tolerates well. - Repeat CT in 3 months.  Due 10/25  Return to clinic in 3 weeks prior to the infusion of pembrolizumab 

## 2023-07-28 NOTE — Assessment & Plan Note (Signed)
 Likely secondary to immunotherapy TSH and T4 improved with levothyroxine  increase  - Increase levothyroxine  to 100 mcg daily - Will monitor levels every 6 weeks

## 2023-07-28 NOTE — Assessment & Plan Note (Signed)
 Patient had incidental diagnosis of CLL.  RAI stage 0. Asymptomatic. More than 50% increase in lymphocytes at this time   - If lymphocytes continue to trend up and patient develops symptoms, can consider starting on treatment.

## 2023-07-28 NOTE — Assessment & Plan Note (Signed)
 Continued tobacco use, a risk factor for kidney cancer.  Advised to quit smoking to reduce health risks.  - Provide resources and support for smoking cessation.

## 2023-07-28 NOTE — Progress Notes (Signed)
 Patient Care Team: Severa Rock HERO, FNP as PCP - General (Family Medicine) Cindie Carlin POUR, DO as Consulting Physician (Internal Medicine) Ladora Ross Lacy Phebe, MD as Referring Physician (Optometry) Greissinger, April Macario, [place] (Hematology and Oncology)  Clinic Day:  07/28/2023  Referring physician: Severa Rock HERO, FNP   CHIEF COMPLAINT:  CC: Renal cell carcinoma    ASSESSMENT & PLAN:   Assessment & Plan: George Anthony  is a 73 y.o. male with metastatic right-sided renal cell carcinoma on pembrolizumab  and axitinib     Renal cell carcinoma of right kidney [facility]) Patient has metastatic renal cell carcinoma with biopsy-proven liver metastasis and lung metastasis. Oncology history as below Started on pembrolizumab  every 6 weeks and axitinib  twice daily in May 2024.  Axitinib  was started later in July 2024 due to problems with access. Held Axitinib  for malignant intertrigo in 06/25 Last imaging was in March 2025 with no evidence of progression Recent CT 06/25 with stable disease  - Continue pembrolizumab  infusions every 6 weeks.   - Continue lenvatinib  10 mg daily last week.  Will increase the dose if patient tolerates well. - Repeat CT in 3 months.  Due 10/25  Return to clinic in 3 weeks prior to the infusion of pembrolizumab   CLL (chronic lymphocytic leukemia) (HCC) Patient had incidental diagnosis of CLL.  RAI stage 0. Asymptomatic. More than 50% increase in lymphocytes at this time   - If lymphocytes continue to trend up and patient develops symptoms, can consider starting on treatment.  Intertrigo Patient likely has malignant intertrigo secondary to axitinib  use. Significantly improved at this time Completed oral fluconazole  course  - Discontinued axitinib   Resolved at this time  Cellulitis Started after discontinuing axitinib .  Unlikely medication side effect. Was recently admitted to the hospital for the same and complete her antibiotics. Following with primary  care and is on Unna boot.  Unable to examine the legs today because of the wrapping.  - Continue to follow with primary care closely  Hypothyroidism Likely secondary to immunotherapy TSH and T4 improved with levothyroxine  increase  - Increase levothyroxine  to 100 mcg daily - Will monitor levels every 6 weeks  Tobacco use Continued tobacco use, a risk factor for kidney cancer.  Advised to quit smoking to reduce health risks.  - Provide resources and support for smoking cessation.    The patient understands the plans discussed today and is in agreement with them.  He knows to contact our office if he develops concerns prior to his next appointment.  I provided 30 minutes of face-to-face time during this encounter and > 50% was spent counseling as documented under my assessment and plan.    George Dry, MD  [facility]  - A DEPT OF [facility] [address] [place] [address] Dept: [phone] Dept Fax: [phone]   No orders of the defined types were placed in this encounter.    ONCOLOGY HISTORY:   Oncology History  Renal cell carcinoma of right kidney (HCC)  08/23/2020 Initial Diagnosis   Renal cell carcinoma, right (HCC)   05/26/2022 Cancer Staging   Staging form: Kidney, AJCC 8th Edition - Clinical stage from 05/26/2022: Stage IV (cM1) - Signed by [name], MD on 06/05/2023 Stage prefix: Recurrence   07/03/2023 -  Chemotherapy   Patient is on Treatment Plan : RENAL CELL Pembrolizumab  (400) + Axitinib  q42d     07/13/2023 Imaging   CT CAP W contrast:  IMPRESSION: CHEST:   1. Rounded nodule in the medial aspect of the RIGHT lower lobe is concerning for renal cell carcinoma metastasis. 2. No thoracic adenopathy.   PELVIS:   1. Post RIGHT nephrectomy. No evidence of local recurrence. 2. Two low-density lesions in the RIGHT hepatic lobe are indeterminate. Consider MRI of the liver  evaluate for metastasis. 3. Mildly enlarged external iliac lymph nodes are NOT favored renal cell carcinoma metastasis 4. Stable round enlargement of the LEFT adrenal gland is stable. 5. No skeletal metastasis   CLL (chronic lymphocytic leukemia) (HCC)  04/21/2022 Cancer Staging   Staging form: Chronic Lymphocytic Leukemia / Small Lymphocytic Lymphoma, AJCC 8th Edition - Clinical stage from 04/21/2022: Modified Rai Stage 0 (Modified Rai risk: Low, Lymphocytosis: Present, Adenopathy: Unknown, Organomegaly: Absent, Anemia: Absent, Thrombocytopenia: Absent) - Signed by [name], MD on 06/05/2023 Stage prefix: Initial diagnosis Hemoglobin (Hgb) (g/dL): 16 Platelet count (k89Z0/O of blood): 156   06/05/2023 Initial Diagnosis   CLL (chronic lymphocytic leukemia) (HCC)   -08/14/2020: 9.1 cm renal mass with IVC thrombus to the level - 09/04/2020: Surgery: Open right nephrectomy with IVC thrombectomy  - Clear-cell RCC, grade 4, invades IVC involved - Sarcomatoid features present - pT3c N0/11 R0 -01/09/2023: CT CAP: Lungs: 6 mm right apical pulmonary nodule that was not seen prior.  Additional subcentimeter pulmonary nodules.  Liver: Overall small size and distribution of low-attenuation lesions.  The lesion in the right hepatic dome and measures approximately 1.8 into 3.5 cm, no new suspicious focal findings.  Kidneys: Right nephrectomy with similar soft tissue nodules in the resection bed, with an index nodule measuring 1.4 and 1 cm.  No left hydronephrosis.  Similar exophytic left renal cyst -04/10/2022: CT CAP: Findings concerning for progressive metastatic disease including enlarging hepatic metastasis, increased nodularity within the right nephrectomy bed, enlarging pulmonary nodules, and worsening multistation lymphadenopathy.  Interval development of a wedge-shaped focus of hypoattenuation in the superior spleen, favored to represent interval infarct.  Similar 3 cm fusiform infrarenal abdominal  aortic aneurysm -04/23/2022: Ultrasound liver biopsy revealed SLL/CLL on core biopsy with atypical lymphoid cells on liver FNA (CLL component is likely blood contaminant) -05/26/2022: Repeat biopsy of liver confirmed RCC -06/17/2022: Pembrolizumab  every 42 days -07/2022: Started axitinib  later than pembrolizumab  due to delay in access.  He is currently on 3 mg twice a day and did not tolerate 6 mg twice a day with nausea and loss of appetite -06/11/2023: Held Axitinib  for malignant intertrigo   Current Treatment: Pembrolizumab  plus axitinib      INTERVAL HISTORY: George Anthony is here today for follow-up.  He presented to the clinic with his wife.  He reported improvement in his bilateral leg swelling and pain.  He completed his course of antibiotics.  He has complaints today and is actually feeling much better.  I have reviewed the past medical history, past surgical history, social history and family history with the patient and they are unchanged from previous note.  ALLERGIES:  has no known allergies.  MEDICATIONS:  Current Outpatient Medications  Medication Sig Dispense Refill   acetaminophen  (TYLENOL ) 500 MG tablet Take 1,300 mg by mouth every 6 (six) hours as needed for moderate pain. 650 mg tab     atorvastatin  (LIPITOR) 40 MG tablet Take 1 tablet (40 mg total) by mouth at bedtime. 90 tablet 1   benzonatate  (TESSALON  PERLES) 100 MG capsule Take 2 capsules (200 mg total) by mouth 3 (three) times daily as needed. 90 capsule 0  ELIQUIS  5 MG TABS tablet Take 1 tablet by mouth twice daily 180 tablet 0   escitalopram  (LEXAPRO ) 10 MG tablet Take 1 tablet by mouth once daily 90 tablet 0   furosemide  (LASIX ) 20 MG tablet Take 2 tablets (40 mg total) by mouth daily. 90 tablet 0   lenvatinib  10 mg daily dose (LENVIMA , 10 MG DAILY DOSE,) capsule Take 1 capsule (10 mg total) by mouth daily. 30 capsule 3   nystatin (MYCOSTATIN/NYSTOP) powder Apply 1 Application topically 2 (two) times daily.      prochlorperazine (COMPAZINE) 10 MG tablet Take 10 mg by mouth every 6 (six) hours as needed.     terbinafine  (LAMISIL ) 1 % cream Apply 1 Application topically 2 (two) times daily. 30 g 2   levothyroxine  (SYNTHROID ) 50 MCG tablet Take 2 tablets (100 mcg total) by mouth daily before breakfast. 60 tablet 2   No current facility-administered medications for this visit.    REVIEW OF SYSTEMS:   Constitutional: Denies fevers, chills or abnormal weight loss Eyes: Denies blurriness of vision Ears, nose, mouth, throat, and face: Denies mucositis or sore throat Respiratory: Denies cough, dyspnea or wheezes Cardiovascular: Denies palpitation, chest discomfort or lower extremity swelling Gastrointestinal:  Denies nausea, heartburn or change in bowel habits Skin: Denies abnormal skin rashes Lymphatics: Denies new lymphadenopathy or easy bruising Neurological:Denies numbness, tingling or new weaknesses Behavioral/Psych: Mood is stable, no new changes  All other systems were reviewed with the patient and are negative.   VITALS:   Blood pressure 120/77, pulse 87, temperature (!) 97.5 F (36.4 C), temperature source Oral, resp. rate 18, weight 203 lb 8 oz (92.3 kg), SpO2 95%.  Wt Readings from Last 3 Encounters:  07/27/23 203 lb 8 oz (92.3 kg)  07/23/23 205 lb (93 kg)  07/15/23 213 lb 10 oz (96.9 kg)    Body mass index is 36.05 kg/m.  Performance status (ECOG): 1 - Symptomatic but completely ambulatory  PHYSICAL EXAM:   GENERAL:alert, no distress and comfortable SKIN: Unable to examine bilateral legs because of Unna boot wrapping.  Previous intertrigo resolved. OROPHARYNX:no exudate, no erythema and lips, buccal mucosa, and tongue normal  LUNGS: clear to auscultation and percussion with normal breathing effort HEART: regular rate & rhythm and no murmurs and no lower extremity edema ABDOMEN:abdomen soft, non-tender and normal bowel sounds Musculoskeletal:no cyanosis of digits and no  clubbing  NEURO: alert & oriented x 3 with fluent speech   LABORATORY DATA:  I have reviewed the data as listed    Component Value Date/Time   NA 132 (L) 07/23/2023 1054   K 4.1 07/23/2023 1054   CL 89 (L) 07/23/2023 1054   CO2 26 07/23/2023 1054   GLUCOSE 146 (H) 07/23/2023 1054   GLUCOSE 123 (H) 07/17/2023 0335   BUN 26 07/23/2023 1054   CREATININE 1.62 (H) 07/23/2023 1054   CALCIUM  9.0 07/23/2023 1054   PROT 6.3 07/23/2023 1054   ALBUMIN 4.1 07/23/2023 1054   AST 15 07/23/2023 1054   ALT 15 07/23/2023 1054   ALKPHOS 108 07/23/2023 1054   BILITOT 0.4 07/23/2023 1054   GFRNONAA 43 (L) 07/17/2023 0335   GFRAA 123 08/17/2019 1636    Lab Results  Component Value Date   WBC 36.1 (HH) 07/23/2023   NEUTROABS 6.0 07/23/2023   HGB 13.2 07/23/2023   HCT 40.5 07/23/2023   MCV 96 07/23/2023   PLT 213 07/23/2023      Chemistry      Component Value Date/Time  NA 132 (L) 07/23/2023 1054   K 4.1 07/23/2023 1054   CL 89 (L) 07/23/2023 1054   CO2 26 07/23/2023 1054   BUN 26 07/23/2023 1054   CREATININE 1.62 (H) 07/23/2023 1054      Component Value Date/Time   CALCIUM  9.0 07/23/2023 1054   ALKPHOS 108 07/23/2023 1054   AST 15 07/23/2023 1054   ALT 15 07/23/2023 1054   BILITOT 0.4 07/23/2023 1054      Latest Reference Range & Units 11/19/11 00:18 08/26/18 10:56 11/07/20 15:12 09/28/22 09:35 07/03/23 09:30 07/23/23 10:54  TSH 0.450 - 4.500 uIU/mL 0.718 1.560 2.700 0.467 102.382 (H) 70.200 (H)  T4,Free(Direct) 0.61 - 1.12 ng/dL    9.11    Thyroxine (T4) 4.5 - 12.0 ug/dL  5.3   <9.5 (L) 3.4 (L)  Free Thyroxine Index 1.2 - 4.9   1.4    0.9 (L)  T3 Uptake Ratio 24 - 39 %  26    25  (H): Data is abnormally high (L): Data is abnormally low  RADIOGRAPHIC STUDIES: I have personally reviewed the radiological images as listed and agreed with the findings in the report.  DG CHEST PORT 1 VIEW Result Date: 07/16/2023 CLINICAL DATA:  Metastatic renal cell carcinoma.  Wheezing.  EXAM: PORTABLE CHEST 1 VIEW COMPARISON:  09/27/2022 FINDINGS: Prior median sternotomy. Apical lordotic positioning. Midline trachea. Borderline cardiomegaly. No pleural effusion or pneumothorax. Clear lungs. IMPRESSION: No active disease. Electronically Signed   By: Rockey Kilts M.D.   On: 07/16/2023 14:04   US  Venous Img Lower Bilateral (DVT) Result Date: 07/15/2023 CLINICAL DATA:  Bilateral leg swelling. EXAM: BILATERAL LOWER EXTREMITY VENOUS DOPPLER ULTRASOUND TECHNIQUE: Gray-scale sonography with graded compression, as well as color Doppler and duplex ultrasound were performed to evaluate the lower extremity deep venous systems from the level of the common femoral vein and including the common femoral, femoral, profunda femoral, popliteal and calf veins including the posterior tibial, peroneal and gastrocnemius veins when visible. The superficial great saphenous vein was also interrogated. Spectral Doppler was utilized to evaluate flow at rest and with distal augmentation maneuvers in the common femoral, femoral and popliteal veins. COMPARISON:  None Available. FINDINGS: RIGHT LOWER EXTREMITY Common Femoral Vein: No evidence of thrombus. Normal compressibility, respiratory phasicity and response to augmentation. Saphenofemoral Junction: No evidence of thrombus. Normal compressibility and flow on color Doppler imaging. Profunda Femoral Vein: No evidence of thrombus. Normal compressibility and flow on color Doppler imaging. Femoral Vein: No evidence of thrombus. Normal compressibility, respiratory phasicity and response to augmentation. Popliteal Vein: No evidence of thrombus. Normal compressibility, respiratory phasicity and response to augmentation. Calf Veins: Limited evaluation. Other Findings:  None. LEFT LOWER EXTREMITY Common Femoral Vein: No evidence of thrombus. Normal compressibility, respiratory phasicity and response to augmentation. Saphenofemoral Junction: No evidence of thrombus. Normal  compressibility and flow on color Doppler imaging. Profunda Femoral Vein: No evidence of thrombus. Normal compressibility and flow on color Doppler imaging. Femoral Vein: Limited compressibility of the mid and distal left femoral vein but likely related to body habitus. There is color Doppler flow in the left femoral vein and no evidence for thrombus. Popliteal Vein: No evidence of thrombus. Normal compressibility, respiratory phasicity and response to augmentation. Calf Veins: Limited evaluation. Other Findings:  None. IMPRESSION: Negative for deep venous thrombosis in either lower extremity. Study has technical limitations and very limited evaluation of the bilateral calf veins. Electronically Signed   By: Juliene Balder M.D.   On: 07/15/2023 16:44   CT  CHEST ABDOMEN PELVIS W CONTRAST Result Date: 07/13/2023 CLINICAL DATA:  Renal cell carcinoma. RIGHT renal. * Tracking Code: BO * EXAM: CT CHEST, ABDOMEN, AND PELVIS WITH CONTRAST TECHNIQUE: Multidetector CT imaging of the chest, abdomen and pelvis was performed following the standard protocol during bolus administration of intravenous contrast. RADIATION DOSE REDUCTION: This exam was performed according to the departmental dose-optimization program which includes automated exposure control, adjustment of the mA and/or kV according to patient size and/or use of iterative reconstruction technique. CONTRAST:  75mL OMNIPAQUE  IOHEXOL  300 MG/ML  SOLN COMPARISON:  CT chest 09/27/2022, CT chest abdomen pelvis 08/09/2020 FINDINGS: CT CHEST FINDINGS Cardiovascular: Post CABG.  No acute cardiac findings Mediastinum/Nodes: No axillary or supraclavicular adenopathy. No mediastinal or hilar adenopathy. No pericardial fluid. Esophagus normal. Lungs/Pleura: Rounded nodule in the medial aspect of the RIGHT lower lobe along the pleural surface measuring 16 mm (image 102/series 3. On comparison CT scan there was dense atelectasis medially at this level. No nodule present on more  remote CT scan. No additional pulmonary nodules. Musculoskeletal: No aggressive osseous lesion. CT ABDOMEN AND PELVIS FINDINGS Hepatobiliary: Linear low-density lesion in the RIGHT hepatic lobe measures 20 mm x 11 mm (image 43/2). Rounded lesion the RIGHT hepatic lobe measures 10 mm on image 50. Postcholecystectomy Pancreas: Pancreas is normal. No ductal dilatation. No pancreatic inflammation. Spleen: Normal spleen Adrenals/urinary tract: Round enlargement of the LEFT adrenal gland to 14 mm not changed from prior (54/2). Post RIGHT nephrectomy. No nodularity in nephrectomy bed. LEFT kidney is normal. Small nonenhancing exophytic cyst from the posterior LEFT kidney measures 10 mm. LEFT ureter bladder normal. Stomach/Bowel: Stomach, small bowel, appendix, and cecum are normal. The colon and rectosigmoid colon are normal. Vascular/Lymphatic: Abdominal aorta normal caliber. Intimal calcifications. There several small periportal lymph nodes Retroperitoneal lymph nodes are not pathologic by size criteria. Mildly enlarged external iliac lymph nodes measures 10 mm on the LEFT (image 100/2). Similar distal external iliac node on the RIGHT measuring 11 mm on image 100. Reproductive: Prostate unremarkable Other: No free fluid. Musculoskeletal: No aggressive osseous lesion. IMPRESSION: CHEST: 1. Rounded nodule in the medial aspect of the RIGHT lower lobe is concerning for renal cell carcinoma metastasis. 2. No thoracic adenopathy. PELVIS: 1. Post RIGHT nephrectomy. No evidence of local recurrence. 2. Two low-density lesions in the RIGHT hepatic lobe are indeterminate. Consider MRI of the liver evaluate for metastasis. 3. Mildly enlarged external iliac lymph nodes are are NOT favored renal cell carcinoma metastasis 4. Stable round enlargement of the LEFT adrenal gland is stable. 5. No skeletal metastasis Electronically Signed   By: Jackquline Boxer M.D.   On: 07/13/2023 09:57

## 2023-07-29 ENCOUNTER — Other Ambulatory Visit: Payer: Self-pay

## 2023-07-31 ENCOUNTER — Other Ambulatory Visit: Payer: Self-pay

## 2023-08-04 ENCOUNTER — Ambulatory Visit

## 2023-08-04 ENCOUNTER — Encounter: Payer: Self-pay | Admitting: Family Medicine

## 2023-08-04 ENCOUNTER — Other Ambulatory Visit (HOSPITAL_COMMUNITY): Payer: Self-pay

## 2023-08-04 ENCOUNTER — Ambulatory Visit: Admitting: Family Medicine

## 2023-08-04 VITALS — BP 127/85 | HR 87 | Temp 96.5°F | Ht 63.0 in | Wt 206.0 lb

## 2023-08-04 DIAGNOSIS — E871 Hypo-osmolality and hyponatremia: Secondary | ICD-10-CM | POA: Diagnosis not present

## 2023-08-04 DIAGNOSIS — R6889 Other general symptoms and signs: Secondary | ICD-10-CM | POA: Diagnosis not present

## 2023-08-04 DIAGNOSIS — L03115 Cellulitis of right lower limb: Secondary | ICD-10-CM | POA: Diagnosis not present

## 2023-08-04 DIAGNOSIS — L03116 Cellulitis of left lower limb: Secondary | ICD-10-CM | POA: Diagnosis not present

## 2023-08-04 DIAGNOSIS — E1121 Type 2 diabetes mellitus with diabetic nephropathy: Secondary | ICD-10-CM

## 2023-08-04 DIAGNOSIS — R2243 Localized swelling, mass and lump, lower limb, bilateral: Secondary | ICD-10-CM | POA: Diagnosis not present

## 2023-08-04 DIAGNOSIS — R748 Abnormal levels of other serum enzymes: Secondary | ICD-10-CM | POA: Diagnosis not present

## 2023-08-04 MED ORDER — SILVER SULFADIAZINE 1 % EX CREA: 1.0000 | TOPICAL_CREAM | Freq: Every day | CUTANEOUS | 0 refills | Status: AC

## 2023-08-04 NOTE — Progress Notes (Addendum)
 Subjective:  Patient ID: George Anthony, male    DOB: Dec 22, 1950, 73 y.o.   MRN: [id number]  Patient Care Team: Severa Rock HERO, FNP as PCP - General (Family Medicine) Cindie Carlin POUR, DO as Consulting Physician (Internal Medicine) Ladora Ross Lacy Phebe, MD as Referring Physician (Optometry) Greissinger, April Macario, PA-C (Hematology and Oncology)   Chief Complaint:  ignacia boot change    HPI: George Anthony is a 73 y.o. male presenting on 08/04/2023 for una boot change    Discussed the use of AI scribe software for clinical note transcription with the patient, who gave verbal consent to proceed.  History of Present Illness   The patient presents with leg swelling and pain.  He is returning today for [employer] removal and reevaluation of cellulitis. He continues to have pain and swelling in both legs, right worse than left describing the sensation as 'going through a hail fire every day where it burns and aches.' The right leg is more swollen than the left, and the pain is severe at times.. He believes that a tightly wrapped bandage contributed to the swelling and discomfort, and he has loosened it to alleviate some of the tightness.  He was hospitalized for the cellulitis for 3 days prior to initial office visit here for the cellulitis. He is currently on Eliquis , taken twice daily. He is also on thyroid  medication twice a day on an empty stomach, waiting at least half an hour before consuming anything else, due to previously significant thyroid  dysfunction.  He reports low sodium levels, which are being monitored. His creatinine level was previously elevated at 1.67, and he is undergoing repeat lab work to assess kidney function.         Relevant past medical, surgical, family, and social history reviewed and updated as indicated.  Allergies and medications reviewed and updated. Data reviewed: Chart in Epic.   Past Medical History:  Diagnosis Date   Anxiety    Diabetes  mellitus without complication (HCC)    Diabetic nephropathy associated with type 2 diabetes mellitus (HCC) 02/21/2021   DVT (deep venous thrombosis) (HCC)    Enlarged heart    Hyperlipidemia    Hypertension    Renal cell carcinoma [facility])     Past Surgical History:  Procedure Laterality Date   BIOPSY  09/16/2019   Procedure: BIOPSY;  Surgeon: Cindie Carlin POUR, DO;  Location: [facility];  Service: Endoscopy;;   COLONOSCOPY WITH PROPOFOL  N/A 09/16/2019   Procedure: COLONOSCOPY WITH PROPOFOL ;  Surgeon: Cindie Carlin POUR, DO;  Location: [facility];  Service: Endoscopy;  Laterality: N/A;  7:30am   POLYPECTOMY  09/16/2019   Procedure: POLYPECTOMY;  Surgeon: Cindie Carlin POUR, DO;  Location: [facility];  Service: Endoscopy;;    Social History   Socioeconomic History   Marital status: Married    Spouse name: charlene   Number of children: 1   Years of education: Not on file   Highest education level: Not on file  Occupational History    Employer: [employer]   Occupation: Retired  Tobacco Use   Smoking status: Every Day    Current packs/day: 1.00    Average packs/day: 1 pack/day for 40.0 years (40.0 ttl pk-yrs)    Types: Cigarettes   Smokeless tobacco: Never  Vaping Use   Vaping status: Never Used  Substance and Sexual Activity   Alcohol use: Yes    Alcohol/week: 3.0 standard drinks of alcohol  Types: 3 Cans of beer per week    Comment: drinking a couple times a month for the last year.- used to drink 2 12 packs a week   Drug use: No   Sexual activity: Yes  Other Topics Concern   Not on file  Social History Narrative   Not on file   Social Drivers of Health   Financial Resource Strain: Low Risk  (04/09/2022)   Overall Financial Resource Strain (CARDIA)    Difficulty of Paying Living Expenses: Not hard at all  Food Insecurity: No Food Insecurity (07/22/2023)   Hunger Vital Sign    Worried About Running Out of Food in the Last Year: Never true    Ran Out of  Food in the Last Year: Never true  Transportation Needs: No Transportation Needs (07/22/2023)   PRAPARE - Administrator, Civil Service (Medical): No    Lack of Transportation (Non-Medical): No  Physical Activity: Inactive (04/09/2022)   Exercise Vital Sign    Days of Exercise per Week: 0 days    Minutes of Exercise per Session: 0 min  Stress: No Stress Concern Present (04/09/2022)   [employer] of Occupational Health - Occupational Stress Questionnaire    Feeling of Stress : Not at all  Social Connections: Moderately Isolated (07/15/2023)   Social Connection and Isolation Panel    Frequency of Communication with Friends and Family: More than three times a week    Frequency of Social Gatherings with Friends and Family: More than three times a week    Attends Religious Services: Never    Database administrator or Organizations: No    Attends Banker Meetings: Never    Marital Status: Married  Catering manager Violence: Not At Risk (07/22/2023)   Humiliation, Afraid, Rape, and Kick questionnaire    Fear of Current or Ex-Partner: No    Emotionally Abused: No    Physically Abused: No    Sexually Abused: No    Outpatient Encounter Medications as of 08/04/2023  Medication Sig   acetaminophen  (TYLENOL ) 500 MG tablet Take 1,300 mg by mouth every 6 (six) hours as needed for moderate pain. 650 mg tab   atorvastatin  (LIPITOR) 40 MG tablet Take 1 tablet (40 mg total) by mouth at bedtime.   benzonatate  (TESSALON  PERLES) 100 MG capsule Take 2 capsules (200 mg total) by mouth 3 (three) times daily as needed.   ELIQUIS  5 MG TABS tablet Take 1 tablet by mouth twice daily   escitalopram  (LEXAPRO ) 10 MG tablet Take 1 tablet by mouth once daily   furosemide  (LASIX ) 20 MG tablet Take 2 tablets (40 mg total) by mouth daily.   gabapentin  (NEURONTIN ) 100 MG capsule Take 1 capsule (100 mg total) by mouth 3 (three) times daily as needed.   lenvatinib  10 mg daily dose (LENVIMA , 10 MG  DAILY DOSE,) capsule Take 1 capsule (10 mg total) by mouth daily.   levothyroxine  (SYNTHROID ) 50 MCG tablet Take 2 tablets (100 mcg total) by mouth daily before breakfast.   nystatin (MYCOSTATIN/NYSTOP) powder Apply 1 Application topically 2 (two) times daily.   prochlorperazine (COMPAZINE) 10 MG tablet Take 10 mg by mouth every 6 (six) hours as needed.   silver  sulfADIAZINE  (SILVADENE ) 1 % cream Apply 1 Application topically daily.   terbinafine  (LAMISIL ) 1 % cream Apply 1 Application topically 2 (two) times daily.   No facility-administered encounter medications on file as of 08/04/2023.    No Known Allergies  Pertinent ROS per  HPI, otherwise unremarkable      Objective:  BP 127/85   Pulse 87   Temp (!) 96.5 F (35.8 C)   Ht 5' 3 (1.6 m)   Wt 206 lb (93.4 kg)   SpO2 97%   BMI 36.49 kg/m    Wt Readings from Last 3 Encounters:  08/04/23 206 lb (93.4 kg)  07/28/23 203 lb (92.1 kg)  07/27/23 203 lb 8 oz (92.3 kg)    Physical Exam Vitals and nursing note reviewed.  Constitutional:      General: He is not in acute distress.    Appearance: Normal appearance. He is obese. He is ill-appearing (chronically ill). He is not toxic-appearing or diaphoretic.  HENT:     Head: Normocephalic and atraumatic.     Nose: Nose normal.     Mouth/Throat:     Mouth: Mucous membranes are moist.  Eyes:     Pupils: Pupils are equal, round, and reactive to light.  Cardiovascular:     Rate and Rhythm: Normal rate and regular rhythm.     Pulses: Normal pulses.     Heart sounds: Normal heart sounds.  Pulmonary:     Effort: Pulmonary effort is normal.     Breath sounds: Rhonchi present.  Musculoskeletal:     Right lower leg: Edema present.     Left lower leg: Edema present.  Skin:    General: Skin is warm and dry.     Capillary Refill: Capillary refill takes less than 2 seconds.     Findings: Wound present.     Comments: BLE: wound to bilateral shins have improved greatly. Minimal swelling  to bilateral legs, right worse than left. No drainage. No significant erythema.   Neurological:     General: No focal deficit present.     Mental Status: He is alert and oriented to person, place, and time.  Psychiatric:        Mood and Affect: Mood normal.        Behavior: Behavior normal.        Thought Content: Thought content normal.        Judgment: Judgment normal.    Physical Exam   EXTREMITIES: Mild tenderness on calf palpation. Swelling on leg where wrapped.        Results for orders placed or performed in visit on 07/23/23  Bayer DCA Hb A1c Waived   Collection Time: 07/23/23 10:52 AM  Result Value Ref Range   HB A1C (BAYER DCA - WAIVED) 6.5 (H) 4.8 - 5.6 %  CMP14+EGFR   Collection Time: 07/23/23 10:54 AM  Result Value Ref Range   Glucose 146 (H) 70 - 99 mg/dL   BUN 26 8 - 27 mg/dL   Creatinine, Ser 8.37 (H) 0.76 - 1.27 mg/dL   eGFR 45 (L) >40 fO/fpw/8.26   BUN/Creatinine Ratio 16 10 - 24   Sodium 132 (L) 134 - 144 mmol/L   Potassium 4.1 3.5 - 5.2 mmol/L   Chloride 89 (L) 96 - 106 mmol/L   CO2 26 20 - 29 mmol/L   Calcium  9.0 8.6 - 10.2 mg/dL   Total Protein 6.3 6.0 - 8.5 g/dL   Albumin 4.1 3.8 - 4.8 g/dL   Globulin, Total 2.2 1.5 - 4.5 g/dL   Bilirubin Total 0.4 0.0 - 1.2 mg/dL   Alkaline Phosphatase 108 44 - 121 IU/L   AST 15 0 - 40 IU/L   ALT 15 0 - 44 IU/L  CBC with Differential/Platelet   Collection  Time: 07/23/23 10:54 AM  Result Value Ref Range   WBC 36.1 (HH) 3.4 - 10.8 x10E3/uL   RBC 4.23 4.14 - 5.80 x10E6/uL   Hemoglobin 13.2 13.0 - 17.7 g/dL   Hematocrit 59.4 62.4 - 51.0 %   MCV 96 79 - 97 fL   MCH 31.2 26.6 - 33.0 pg   MCHC 32.6 31.5 - 35.7 g/dL   RDW 85.9 88.3 - 84.5 %   Platelets 213 150 - 450 x10E3/uL   Neutrophils 17 Not Estab. %   Lymphs 80 Not Estab. %   Monocytes 2 Not Estab. %   Eos 1 Not Estab. %   Basos 0 Not Estab. %   Neutrophils Absolute 6.0 1.4 - 7.0 x10E3/uL   Lymphocytes Absolute 29.0 (H) 0.7 - 3.1 x10E3/uL   Monocytes  Absolute 0.6 0.1 - 0.9 x10E3/uL   EOS (ABSOLUTE) 0.3 0.0 - 0.4 x10E3/uL   Basophils Absolute 0.2 0.0 - 0.2 x10E3/uL   Immature Granulocytes 0 Not Estab. %   Immature Grans (Abs) 0.0 0.0 - 0.1 x10E3/uL   Hematology Comments: Note:   Magnesium    Collection Time: 07/23/23 10:54 AM  Result Value Ref Range   Magnesium  1.2 (L) 1.6 - 2.3 mg/dL  Thyroid  Panel With TSH   Collection Time: 07/23/23 10:54 AM  Result Value Ref Range   TSH 70.200 (H) 0.450 - 4.500 uIU/mL   T4, Total 3.4 (L) 4.5 - 12.0 ug/dL   T3 Uptake Ratio 25 24 - 39 %   Free Thyroxine Index 0.9 (L) 1.2 - 4.9       Pertinent labs & imaging results that were available during my care of the patient were reviewed by me and considered in my medical decision making.  Assessment & Plan:  Tiago was seen today for una boot change .  Diagnoses and all orders for this visit:  Localized swelling of both lower legs -     Brain natriuretic peptide -     Magnesium  -     CMP14+EGFR  Bilateral cellulitis of lower leg -     Brain natriuretic peptide -     Magnesium  -     CMP14+EGFR -     silver  sulfADIAZINE  (SILVADENE ) 1 % cream; Apply 1 Application topically daily.  Diabetic nephropathy associated with type 2 diabetes mellitus (HCC) -     Magnesium  -     CMP14+EGFR  Hypomagnesemia -     Magnesium   Hyponatremia -     CMP14+EGFR      Leg swelling and pain Significant swelling and pain in the left leg, described as burning and aching, with less swelling in the right leg. The pain is severe enough to cause emotional distress. Suspected to be related to tight bandaging. Low probability of blood clot due to current use of Eliquis . If swelling does not improve by Thursday, an ultrasound will be ordered to rule out deep vein thrombosis. Treatment with Eliquis  would continue if a clot is present. - Advise monitoring of leg swelling and pain. - Instruct to call if swelling does not improve by Thursday for potential ultrasound. -  Prescribe Silvadene  cream for topical application once daily. - Instruct to gently wash the area with soap and water  before applying cream. - Advise leg elevation to reduce swelling.  Renal function impairment Creatinine level previously elevated at 1.67, indicating impaired renal function. Plan to repeat lab work to assess current kidney function and ensure improvement. - Order repeat lab work to assess kidney function,  including creatinine levels.  Hyponatremia Low sodium levels. Plan to recheck sodium levels to ensure normalization. - Order repeat lab work to assess sodium levels.  Thyroid  dysfunction Thyroid  dysfunction managed with medication. Reports taking medication on an empty stomach as advised. Plan to monitor thyroid  function to ensure stability. - Order repeat lab work to assess thyroid  function.  General Health Maintenance On Eliquis  for anticoagulation, reducing risk of blood clots. Advised to continue current medication regimen. - Continue Eliquis  as prescribed.  Follow-up Advised to follow up in two weeks to reassess leg condition and review lab results. - Schedule follow-up appointment in two weeks.       Continue all other maintenance medications.  Follow up plan: Return in about 2 weeks (around 08/18/2023), or if symptoms worsen or fail to improve, for Wound Recheck.   Continue healthy lifestyle choices, including diet (rich in fruits, vegetables, and lean proteins, and low in salt and simple carbohydrates) and exercise (at least 30 minutes of moderate physical activity daily).  Educational handout given for wound care  The above assessment and management plan was discussed with the patient. The patient verbalized understanding of and has agreed to the management plan. Patient is aware to call the clinic if they develop any new symptoms or if symptoms persist or worsen. Patient is aware when to return to the clinic for a follow-up visit. Patient educated on when  it is appropriate to go to the emergency department.   Rosaline Bruns, FNP-C [facility] [phone]

## 2023-08-05 ENCOUNTER — Encounter: Payer: Self-pay | Admitting: Oncology

## 2023-08-05 ENCOUNTER — Other Ambulatory Visit: Payer: Self-pay

## 2023-08-05 LAB — CMP14+EGFR
ALT: 14 IU/L (ref 0–44)
AST: 21 IU/L (ref 0–40)
Albumin: 4.2 g/dL (ref 3.8–4.8)
Alkaline Phosphatase: 134 IU/L — ABNORMAL HIGH (ref 44–121)
BUN/Creatinine Ratio: 10 (ref 10–24)
BUN: 12 mg/dL (ref 8–27)
Bilirubin Total: 0.5 mg/dL (ref 0.0–1.2)
CO2: 22 mmol/L (ref 20–29)
Calcium: 8 mg/dL — ABNORMAL LOW (ref 8.6–10.2)
Chloride: 92 mmol/L — ABNORMAL LOW (ref 96–106)
Creatinine, Ser: 1.18 mg/dL (ref 0.76–1.27)
Globulin, Total: 2.1 g/dL (ref 1.5–4.5)
Glucose: 125 mg/dL — ABNORMAL HIGH (ref 70–99)
Potassium: 4.6 mmol/L (ref 3.5–5.2)
Sodium: 133 mmol/L — ABNORMAL LOW (ref 134–144)
Total Protein: 6.3 g/dL (ref 6.0–8.5)
eGFR: 66 mL/min/1.73 (ref >59)

## 2023-08-05 LAB — BRAIN NATRIURETIC PEPTIDE: BNP: 11.7 pg/mL (ref 0.0–100.0)

## 2023-08-05 LAB — MAGNESIUM: Magnesium: 1.7 mg/dL (ref 1.6–2.3)

## 2023-08-06 ENCOUNTER — Ambulatory Visit: Payer: Self-pay | Admitting: Family Medicine

## 2023-08-07 ENCOUNTER — Encounter: Payer: Self-pay | Admitting: Oncology

## 2023-08-07 ENCOUNTER — Ambulatory Visit

## 2023-08-08 ENCOUNTER — Other Ambulatory Visit (HOSPITAL_COMMUNITY): Payer: Self-pay

## 2023-08-11 LAB — SPECIMEN STATUS REPORT

## 2023-08-11 LAB — ALKALINE PHOSPHATASE, ISOENZYMES
Alkaline Phosphatase: 129 IU/L — AB (ref 44–121)
BONE FRACTION: 29 (ref 12–68)
INTESTINAL FRAC.: 0 (ref 0–18)
LIVER FRACTION: 71 (ref 13–88)

## 2023-08-12 ENCOUNTER — Other Ambulatory Visit (HOSPITAL_COMMUNITY): Payer: Self-pay

## 2023-08-12 ENCOUNTER — Other Ambulatory Visit: Payer: Self-pay

## 2023-08-12 NOTE — Progress Notes (Signed)
 Specialty Pharmacy Ongoing Clinical Assessment Note  George Anthony is a 73 y.o. male who is being followed by the specialty pharmacy service for RxSp Oncology   Patient's specialty medication(s) reviewed today: Lenvatinib  Mesylate (LENVIMA )   Missed doses in the last 4 weeks: 1 (Patient will miss one dose, did not return pharmacy's phone calls and called when he was out.  Medicaiton is on order for tomorrow.)   Patient/Caregiver did not have any additional questions or concerns.   Therapeutic benefit summary: Patient is achieving benefit   Adverse events/side effects summary: No adverse events/side effects   Patient's therapy is appropriate to: Continue    Goals Addressed             This Visit's Progress    Slow Disease Progression   On track    Patient is on track. Patient will maintain adherence.  Dr. Davonna documented that the CT from 07/15/23 showed stable disease.         Follow up: 3 months  Silvano LOISE Dolly Specialty Pharmacist

## 2023-08-12 NOTE — Progress Notes (Signed)
 Specialty Pharmacy Refill Coordination Note  Spoke with George Anthony is a 73 y.o. male contacted today regarding refills of specialty medication(s) Lenvatinib  Mesylate (LENVIMA )  Doses on hand: 0 (Took last capsule 08/12/23)  Patient requested: Delivery   Delivery date: 08/14/23   Verified address: [address] [place] [address]  Medication will be filled on 08/13/23.  **Medication will have to be ordered**

## 2023-08-14 ENCOUNTER — Inpatient Hospital Stay

## 2023-08-14 ENCOUNTER — Ambulatory Visit: Payer: Self-pay

## 2023-08-14 ENCOUNTER — Inpatient Hospital Stay: Admitting: Oncology

## 2023-08-14 VITALS — BP 135/89 | HR 70 | Temp 98.2°F | Resp 18

## 2023-08-14 DIAGNOSIS — Z7984 Long term (current) use of oral hypoglycemic drugs: Secondary | ICD-10-CM | POA: Diagnosis not present

## 2023-08-14 DIAGNOSIS — Z720 Tobacco use: Secondary | ICD-10-CM

## 2023-08-14 DIAGNOSIS — C9111 Chronic lymphocytic leukemia of B-cell type in remission: Secondary | ICD-10-CM | POA: Diagnosis not present

## 2023-08-14 DIAGNOSIS — Z86718 Personal history of other venous thrombosis and embolism: Secondary | ICD-10-CM | POA: Diagnosis not present

## 2023-08-14 DIAGNOSIS — Z79899 Other long term (current) drug therapy: Secondary | ICD-10-CM | POA: Diagnosis not present

## 2023-08-14 DIAGNOSIS — E039 Hypothyroidism, unspecified: Secondary | ICD-10-CM | POA: Diagnosis not present

## 2023-08-14 DIAGNOSIS — L03115 Cellulitis of right lower limb: Secondary | ICD-10-CM | POA: Diagnosis not present

## 2023-08-14 DIAGNOSIS — C787 Secondary malignant neoplasm of liver and intrahepatic bile duct: Secondary | ICD-10-CM | POA: Diagnosis not present

## 2023-08-14 DIAGNOSIS — L309 Dermatitis, unspecified: Secondary | ICD-10-CM | POA: Diagnosis not present

## 2023-08-14 DIAGNOSIS — C641 Malignant neoplasm of right kidney, except renal pelvis: Secondary | ICD-10-CM | POA: Diagnosis not present

## 2023-08-14 DIAGNOSIS — L304 Erythema intertrigo: Secondary | ICD-10-CM | POA: Diagnosis not present

## 2023-08-14 DIAGNOSIS — C9110 Chronic lymphocytic leukemia of B-cell type not having achieved remission: Secondary | ICD-10-CM | POA: Diagnosis not present

## 2023-08-14 DIAGNOSIS — L03116 Cellulitis of left lower limb: Secondary | ICD-10-CM | POA: Diagnosis not present

## 2023-08-14 DIAGNOSIS — F17210 Nicotine dependence, cigarettes, uncomplicated: Secondary | ICD-10-CM | POA: Diagnosis not present

## 2023-08-14 DIAGNOSIS — Z905 Acquired absence of kidney: Secondary | ICD-10-CM | POA: Diagnosis not present

## 2023-08-14 DIAGNOSIS — C7800 Secondary malignant neoplasm of unspecified lung: Secondary | ICD-10-CM | POA: Diagnosis not present

## 2023-08-14 LAB — CBC WITH DIFFERENTIAL/PLATELET
Abs Immature Granulocytes: 0 K/uL (ref 0.00–0.07)
Basophils Absolute: 0 K/uL (ref 0.0–0.1)
Basophils Relative: 0 %
Eosinophils Absolute: 0 K/uL (ref 0.0–0.5)
Eosinophils Relative: 0 %
HCT: 39.2 % (ref 39.0–52.0)
Hemoglobin: 13.1 g/dL (ref 13.0–17.0)
Lymphocytes Relative: 64 %
Lymphs Abs: 19.1 K/uL — ABNORMAL HIGH (ref 0.7–4.0)
MCH: 33.8 pg (ref 26.0–34.0)
MCHC: 33.4 g/dL (ref 30.0–36.0)
MCV: 101 fL — ABNORMAL HIGH (ref 80.0–100.0)
Monocytes Absolute: 1.2 K/uL — ABNORMAL HIGH (ref 0.1–1.0)
Monocytes Relative: 4 %
Neutro Abs: 9.6 K/uL — ABNORMAL HIGH (ref 1.7–7.7)
Neutrophils Relative %: 32 %
Platelets: 136 K/uL — ABNORMAL LOW (ref 150–400)
RBC: 3.88 MIL/uL — ABNORMAL LOW (ref 4.22–5.81)
RDW: 17.7 % — ABNORMAL HIGH (ref 11.5–15.5)
WBC: 29.9 K/uL — ABNORMAL HIGH (ref 4.0–10.5)
nRBC: 0 % (ref 0.0–0.2)

## 2023-08-14 LAB — COMPREHENSIVE METABOLIC PANEL WITH GFR
ALT: 15 U/L (ref 0–44)
AST: 19 U/L (ref 15–41)
Albumin: 3.2 g/dL — ABNORMAL LOW (ref 3.5–5.0)
Alkaline Phosphatase: 110 U/L (ref 38–126)
Anion gap: 13 (ref 5–15)
BUN: 15 mg/dL (ref 8–23)
CO2: 26 mmol/L (ref 22–32)
Calcium: 7.9 mg/dL — ABNORMAL LOW (ref 8.9–10.3)
Chloride: 92 mmol/L — ABNORMAL LOW (ref 98–111)
Creatinine, Ser: 1.23 mg/dL (ref 0.61–1.24)
GFR, Estimated: >60 mL/min (ref >60)
Glucose, Bld: 192 mg/dL — ABNORMAL HIGH (ref 70–99)
Potassium: 3.2 mmol/L — ABNORMAL LOW (ref 3.5–5.1)
Sodium: 131 mmol/L — ABNORMAL LOW (ref 135–145)
Total Bilirubin: 0.6 mg/dL (ref 0.0–1.2)
Total Protein: 6 g/dL — ABNORMAL LOW (ref 6.5–8.1)

## 2023-08-14 MED ORDER — CLOBETASOL PROPIONATE 0.05 % EX CREA: 1.0000 | TOPICAL_CREAM | Freq: Two times a day (BID) | CUTANEOUS | 0 refills | Status: DC

## 2023-08-14 NOTE — Assessment & Plan Note (Addendum)
 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

## 2023-08-14 NOTE — Progress Notes (Signed)
 No treatment today per Dr. Davonna.

## 2023-08-14 NOTE — Assessment & Plan Note (Addendum)
 Continued tobacco use, a risk factor for kidney cancer.   - Advised to quit smoking to reduce health risks.

## 2023-08-14 NOTE — Telephone Encounter (Signed)
 Called and spoke with them they state that Dr. Davonna thinks his legs problems are coming from the infusions and has stopped them and sending him to dermatology

## 2023-08-14 NOTE — Assessment & Plan Note (Addendum)
 Patient had incidental diagnosis of CLL.  RAI stage 0. Asymptomatic. More than 50% increase in lymphocytes at this time   - If lymphocytes continue to trend up and patient develops symptoms, can consider starting on treatment.

## 2023-08-14 NOTE — Telephone Encounter (Signed)
 FYI Only or Action Required?: FYI only for provider.  Patient was last seen in primary care on 08/04/2023 by Severa Rock HERO, FNP.  Called Nurse Triage reporting Advice Only.  Triage Disposition: Information or Advice Only Call  Patient/caregiver understands and will follow disposition?: Yes  Copied from CRM [id number]. Topic: Clinical - Medical Advice >> Aug 14, 2023  1:54 PM Everette C wrote: Reason for CRM: The patient's wife would like to speak further with a member of clinical staff about recent discoveries made in relation to the patient's leg concerns and their cessation of their infusion treatments. Please contact further when possible Reason for Disposition  [1] Follow-up call to recent contact AND [2] information only call, no triage required  Answer Assessment - Initial Assessment Questions 1. REASON FOR CALL: What is the main reason for your call? or How can I best help you? This nurse returned call to patient's wife who states that she does not remember calling the office.  Patient also denies calling office.  They have no triage needs.  They will call the office with any concerns  Protocols used: Information Only Call - No Triage-A-AH

## 2023-08-14 NOTE — Assessment & Plan Note (Addendum)
 Patient has metastatic renal cell carcinoma with biopsy-proven liver metastasis and lung metastasis. Oncology history as below Started on pembrolizumab  every 6 weeks and axitinib  twice daily in May 2024.  Axitinib  was started later in July 2024 due to problems with access. Held Axitinib  for malignant intertrigo in 06/25 Last imaging was in March 2025 with no evidence of progression Recent CT 06/25 with stable disease  - Continue pembrolizumab  infusions every 6 weeks.  Holding today for bilateral lower leg cellulitis/bullous dermatitis. - Continue lenvatinib  10 mg daily last week.  Will increase the dose if patient tolerates well. - Repeat CT in 3 months.  Due 10/25  Return to clinic in 3 weeks to assess for dermatitis.

## 2023-08-14 NOTE — Assessment & Plan Note (Addendum)
 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 Significantly improved at this time Completed oral fluconazole  course  - Discontinued axitinib   Resolved at this time

## 2023-08-14 NOTE — Assessment & Plan Note (Addendum)
 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

## 2023-08-14 NOTE — Progress Notes (Signed)
 Patient Care Team: Severa Rock HERO, FNP as PCP - General (Family Medicine) Cindie Carlin POUR, DO as Consulting Physician (Internal Medicine) Ladora Ross Lacy Phebe, MD as Referring Physician (Optometry) Greissinger, April Macario, [place] (Hematology and Oncology)  Clinic Day:  08/14/2023  Referring physician: Severa Rock HERO, FNP   CHIEF COMPLAINT:  CC: Renal cell carcinoma    ASSESSMENT & PLAN:   Assessment & Plan: George Anthony  is a 73 y.o. male with metastatic right-sided renal cell carcinoma on pembrolizumab  and axitinib     Assessment & Plan Renal cell carcinoma of right kidney [facility]) Patient has metastatic renal cell carcinoma with biopsy-proven liver metastasis and lung metastasis. Oncology history as below Started on pembrolizumab  every 6 weeks and axitinib  twice daily in May 2024.  Axitinib  was started later in July 2024 due to problems with access. Held Axitinib  for malignant intertrigo in 06/25 Last imaging was in March 2025 with no evidence of progression Recent CT 06/25 with stable disease  - Continue pembrolizumab  infusions every 6 weeks.  Holding today for bilateral lower leg cellulitis/bullous dermatitis. - Continue lenvatinib  10 mg daily last week.  Will increase the dose if patient tolerates well. - Repeat CT in 3 months.  Due 10/25  Return to clinic in 3 weeks to assess for dermatitis. Dermatitis 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 CLL (chronic lymphocytic leukemia) (HCC) Patient had incidental diagnosis of CLL.  RAI stage 0. Asymptomatic. More than 50% increase in lymphocytes at this time   - If  lymphocytes continue to trend up and patient develops symptoms, can consider starting on treatment. Intertrigo Patient likely has malignant intertrigo secondary to axitinib  use. Significantly improved at this time Completed oral fluconazole  course  - Discontinued axitinib   Resolved at this time Tobacco use Continued tobacco use, a risk factor for kidney cancer.   - Advised to quit smoking to reduce health risks. Acquired hypothyroidism Likely secondary to immunotherapy TSH and T4 improved with levothyroxine  increase Patient reports some hoarseness today  - Continue levothyroxine  to 100 mcg daily - Will monitor levels every 6 weeks  Orders Placed This Encounter  Procedures   CBC with Differential/Platelet    Standing Status:   Future    Expected Date:   09/04/2023    Expiration Date:   12/03/2023   Comprehensive metabolic panel with GFR    Standing Status:   Future    Expected Date:   09/04/2023    Expiration Date:   12/03/2023   TSH    Standing Status:   Future    Expected Date:   09/04/2023    Expiration Date:   12/03/2023   T4, free    Standing Status:   Future    Expected Date:   09/04/2023    Expiration Date:   12/03/2023     The patient understands the plans discussed today and is in agreement with them.  He knows to contact our office if he develops concerns prior to his next appointment.  I provided 30 minutes of face-to-face time during this encounter and > 50% was spent counseling as documented under my assessment and plan.    Mickiel Dry, MD  [facility]  [facility] - A DEPT OF [facility] [address] [place] [address] Dept: [phone] Dept Fax: [phone]   Orders Placed This Encounter  Procedures   CBC with Differential/Platelet    Standing Status:   Future    Expected Date:   09/04/2023    Expiration Date:   12/03/2023   Comprehensive metabolic panel with GFR    Standing Status:    Future    Expected Date:   09/04/2023    Expiration Date:   12/03/2023   TSH    Standing Status:   Future    Expected Date:   09/04/2023    Expiration Date:   12/03/2023   T4, free    Standing Status:   Future    Expected Date:   09/04/2023    Expiration Date:   12/03/2023     ONCOLOGY HISTORY:   Oncology History  Renal cell carcinoma of right kidney (HCC)  08/23/2020 Initial Diagnosis   Renal cell carcinoma, right (HCC)   05/26/2022 Cancer Staging   Staging form: Kidney, AJCC 8th Edition - Clinical stage from 05/26/2022: Stage IV (cM1) - Signed by [name], MD on 06/05/2023 Stage prefix: Recurrence   07/03/2023 -  Chemotherapy   Patient is on Treatment Plan : RENAL CELL Pembrolizumab  (400) + Axitinib  q42d     07/13/2023 Imaging   CT CAP W contrast:  IMPRESSION: CHEST:   1. Rounded nodule in the medial aspect of the RIGHT lower lobe is concerning for renal cell carcinoma metastasis. 2. No thoracic adenopathy.   PELVIS:   1. Post RIGHT nephrectomy. No evidence of local recurrence. 2. Two low-density lesions in the RIGHT hepatic lobe are indeterminate. Consider MRI of the liver evaluate for metastasis. 3. Mildly enlarged external iliac lymph nodes are NOT favored renal cell carcinoma metastasis 4. Stable round enlargement of the LEFT adrenal gland is stable. 5. No skeletal metastasis   CLL (chronic lymphocytic leukemia) (HCC)  04/21/2022 Cancer Staging   Staging form: Chronic Lymphocytic Leukemia / Small Lymphocytic Lymphoma, AJCC 8th Edition - Clinical stage from 04/21/2022: Modified Rai Stage 0 (Modified Rai risk: Low, Lymphocytosis: Present, Adenopathy: Unknown, Organomegaly: Absent, Anemia: Absent, Thrombocytopenia: Absent) - Signed by [name], MD on 06/05/2023 Stage prefix: Initial diagnosis Hemoglobin (Hgb) (g/dL): 16 Platelet count (k89Z0/O of blood): 156   06/05/2023 Initial Diagnosis   CLL (chronic lymphocytic leukemia) (HCC)   -08/14/2020: 9.1 cm renal  mass with IVC thrombus to the level - 09/04/2020: Surgery: Open right nephrectomy with IVC thrombectomy  - Clear-cell RCC, grade 4, invades IVC involved - Sarcomatoid features present - pT3c N0/11 R0 -01/09/2023: CT CAP: Lungs: 6 mm right apical pulmonary nodule that was not seen prior.  Additional subcentimeter pulmonary nodules.  Liver: Overall small size and distribution of low-attenuation lesions.  The lesion in the right hepatic dome and measures approximately 1.8 into 3.5 cm, no new suspicious focal findings.  Kidneys: Right nephrectomy with similar soft tissue nodules in the resection bed, with an index nodule measuring 1.4 and 1 cm.  No left hydronephrosis.  Similar exophytic left renal cyst -04/10/2022: CT CAP: Findings concerning for progressive metastatic disease including enlarging hepatic metastasis, increased nodularity within the right nephrectomy bed, enlarging pulmonary nodules, and worsening multistation lymphadenopathy.  Interval development of a wedge-shaped focus of hypoattenuation in the superior spleen, favored to represent interval infarct.  Similar 3 cm fusiform infrarenal abdominal aortic aneurysm -04/23/2022: Ultrasound liver biopsy revealed SLL/CLL on core biopsy  with atypical lymphoid cells on liver FNA (CLL component is likely blood contaminant) -05/26/2022: Repeat biopsy of liver confirmed RCC -06/17/2022: Pembrolizumab  every 42 days -07/2022: Started axitinib  later than pembrolizumab  due to delay in access.  He is currently on 3 mg twice a day and did not tolerate 6 mg twice a day with nausea and loss of appetite -06/11/2023: Held Axitinib  for malignant intertrigo   Current Treatment: Pembrolizumab  plus axitinib      INTERVAL HISTORY: George Anthony is here today for follow-up.  He presented to the clinic with his wife.   Patient reported worsening bilateral lower leg swelling and erythema.  Right leg worse than the left.  He also reported his groin area inflammation  has some burning sensation from it but no pain.  Denies fever, chills.  Informed me that he ran out of lenvatinib  2 days ago and the prescription is in the mail.  He has no other complaints today.   I have reviewed the past medical history, past surgical history, social history and family history with the patient and they are unchanged from previous note.  ALLERGIES:  has no known allergies.  MEDICATIONS:  Current Outpatient Medications  Medication Sig Dispense Refill   acetaminophen  (TYLENOL ) 500 MG tablet Take 1,300 mg by mouth every 6 (six) hours as needed for moderate pain. 650 mg tab     atorvastatin  (LIPITOR) 40 MG tablet Take 1 tablet (40 mg total) by mouth at bedtime. 90 tablet 1   benzonatate  (TESSALON  PERLES) 100 MG capsule Take 2 capsules (200 mg total) by mouth 3 (three) times daily as needed. 90 capsule 0   clobetasol cream (TEMOVATE) 0.05 % Apply 1 Application topically 2 (two) times daily. 30 g 0   ELIQUIS  5 MG TABS tablet Take 1 tablet by mouth twice daily 180 tablet 0   escitalopram  (LEXAPRO ) 10 MG tablet Take 1 tablet by mouth once daily 90 tablet 0   furosemide  (LASIX ) 20 MG tablet Take 2 tablets (40 mg total) by mouth daily. 90 tablet 0   gabapentin  (NEURONTIN ) 100 MG capsule Take 1 capsule (100 mg total) by mouth 3 (three) times daily as needed. 30 capsule 3   lenvatinib  10 mg daily dose (LENVIMA , 10 MG DAILY DOSE,) capsule Take 1 capsule (10 mg total) by mouth daily. 30 capsule 3   levothyroxine  (SYNTHROID ) 50 MCG tablet Take 2 tablets (100 mcg total) by mouth daily before breakfast. 60 tablet 2   nystatin (MYCOSTATIN/NYSTOP) powder Apply 1 Application topically 2 (two) times daily.     prochlorperazine (COMPAZINE) 10 MG tablet Take 10 mg by mouth every 6 (six) hours as needed.     silver  sulfADIAZINE  (SILVADENE ) 1 % cream Apply 1 Application topically daily. 50 g 0   terbinafine  (LAMISIL ) 1 % cream Apply 1 Application topically 2 (two) times daily. 30 g 2   No current  facility-administered medications for this visit.    REVIEW OF SYSTEMS:   Constitutional: Denies fevers, chills or abnormal weight loss Eyes: Denies blurriness of vision Ears, nose, mouth, throat, and face: Denies mucositis or sore throat Respiratory: Denies cough, dyspnea or wheezes Cardiovascular: Denies palpitation, chest discomfort or lower extremity swelling Gastrointestinal:  Denies nausea, heartburn or change in bowel habits Skin: Denies abnormal skin rashes Lymphatics: Denies new lymphadenopathy or easy bruising Neurological:Denies numbness, tingling or new weaknesses Behavioral/Psych: Mood is stable, no new changes  All other systems were reviewed with the patient and are negative.   VITALS:   Blood pressure  135/89, pulse 70, temperature 98.2 F (36.8 C), temperature source Oral, resp. rate 18, SpO2 95%.  Wt Readings from Last 3 Encounters:  08/04/23 206 lb (93.4 kg)  07/28/23 203 lb (92.1 kg)  07/27/23 203 lb 8 oz (92.3 kg)    There is no height or weight on file to calculate BMI.  Performance status (ECOG): 1 - Symptomatic but completely ambulatory  PHYSICAL EXAM:   GENERAL:alert, no distress and comfortable SKIN: Bilateral leg swelling extending up to the knee, erythematous.  Ulcer on the right leg.  Pictures below.  Previous intertrigo resolved. OROPHARYNX:no exudate, no erythema and lips, buccal mucosa, and tongue normal  LUNGS: clear to auscultation and percussion with normal breathing effort HEART: regular rate & rhythm and no murmurs and no lower extremity edema ABDOMEN:abdomen soft, non-tender and normal bowel sounds Musculoskeletal:no cyanosis of digits and no clubbing  NEURO: alert & oriented x 3 with fluent speech   LABORATORY DATA:  I have reviewed the data as listed    Component Value Date/Time   NA 131 (L) 08/14/2023 0908   NA 133 (L) 08/04/2023 1518   K 3.2 (L) 08/14/2023 0908   CL 92 (L) 08/14/2023 0908   CO2 26 08/14/2023 0908   GLUCOSE  192 (H) 08/14/2023 0908   BUN 15 08/14/2023 0908   BUN 12 08/04/2023 1518   CREATININE 1.23 08/14/2023 0908   CALCIUM  7.9 (L) 08/14/2023 0908   PROT 6.0 (L) 08/14/2023 0908   PROT 6.3 08/04/2023 1518   ALBUMIN 3.2 (L) 08/14/2023 0908   ALBUMIN 4.2 08/04/2023 1518   AST 19 08/14/2023 0908   ALT 15 08/14/2023 0908   ALKPHOS 110 08/14/2023 0908   BILITOT 0.6 08/14/2023 0908   BILITOT 0.5 08/04/2023 1518   GFRNONAA >60 08/14/2023 0908   GFRAA 123 08/17/2019 1636    Lab Results  Component Value Date   WBC 29.9 (H) 08/14/2023   NEUTROABS 9.6 (H) 08/14/2023   HGB 13.1 08/14/2023   HCT 39.2 08/14/2023   MCV 101.0 (H) 08/14/2023   PLT 136 (L) 08/14/2023      Chemistry      Component Value Date/Time   NA 131 (L) 08/14/2023 0908   NA 133 (L) 08/04/2023 1518   K 3.2 (L) 08/14/2023 0908   CL 92 (L) 08/14/2023 0908   CO2 26 08/14/2023 0908   BUN 15 08/14/2023 0908   BUN 12 08/04/2023 1518   CREATININE 1.23 08/14/2023 0908      Component Value Date/Time   CALCIUM  7.9 (L) 08/14/2023 0908   ALKPHOS 110 08/14/2023 0908   AST 19 08/14/2023 0908   ALT 15 08/14/2023 0908   BILITOT 0.6 08/14/2023 0908   BILITOT 0.5 08/04/2023 1518      Latest Reference Range & Units 11/19/11 00:18 08/26/18 10:56 11/07/20 15:12 09/28/22 09:35 07/03/23 09:30 07/23/23 10:54  TSH 0.450 - 4.500 uIU/mL 0.718 1.560 2.700 0.467 102.382 (H) 70.200 (H)  T4,Free(Direct) 0.61 - 1.12 ng/dL    9.11    Thyroxine (T4) 4.5 - 12.0 ug/dL  5.3   <9.5 (L) 3.4 (L)  Free Thyroxine Index 1.2 - 4.9   1.4    0.9 (L)  T3 Uptake Ratio 24 - 39 %  26    25  (H): Data is abnormally high (L): Data is abnormally low  RADIOGRAPHIC STUDIES: I have personally reviewed the radiological images as listed and agreed with the findings in the report.  None new to review

## 2023-08-16 ENCOUNTER — Other Ambulatory Visit: Payer: Self-pay | Admitting: Family Medicine

## 2023-08-16 DIAGNOSIS — E1169 Type 2 diabetes mellitus with other specified complication: Secondary | ICD-10-CM

## 2023-08-18 ENCOUNTER — Ambulatory Visit: Admitting: Dermatology

## 2023-08-18 ENCOUNTER — Ambulatory Visit: Admitting: Family Medicine

## 2023-08-20 ENCOUNTER — Emergency Department (HOSPITAL_COMMUNITY)

## 2023-08-20 ENCOUNTER — Other Ambulatory Visit: Payer: Self-pay

## 2023-08-20 ENCOUNTER — Encounter (HOSPITAL_COMMUNITY): Payer: Self-pay

## 2023-08-20 ENCOUNTER — Observation Stay (HOSPITAL_COMMUNITY)
Admission: EM | Admit: 2023-08-20 | Discharge: 2023-08-23 | Disposition: A | Discharge status: Home / Self Care | Attending: Internal Medicine | Admitting: Internal Medicine

## 2023-08-20 DIAGNOSIS — E278 Other specified disorders of adrenal gland: Secondary | ICD-10-CM | POA: Insufficient documentation

## 2023-08-20 DIAGNOSIS — J9601 Acute respiratory failure with hypoxia: Principal | ICD-10-CM | POA: Insufficient documentation

## 2023-08-20 DIAGNOSIS — Y939 Activity, unspecified: Secondary | ICD-10-CM | POA: Insufficient documentation

## 2023-08-20 DIAGNOSIS — M4802 Spinal stenosis, cervical region: Secondary | ICD-10-CM | POA: Diagnosis not present

## 2023-08-20 DIAGNOSIS — S81802S Unspecified open wound, left lower leg, sequela: Secondary | ICD-10-CM | POA: Diagnosis not present

## 2023-08-20 DIAGNOSIS — Z7901 Long term (current) use of anticoagulants: Secondary | ICD-10-CM | POA: Insufficient documentation

## 2023-08-20 DIAGNOSIS — S62345A Nondisplaced fracture of base of fourth metacarpal bone, left hand, initial encounter for closed fracture: Secondary | ICD-10-CM | POA: Insufficient documentation

## 2023-08-20 DIAGNOSIS — Y929 Unspecified place or not applicable: Secondary | ICD-10-CM | POA: Insufficient documentation

## 2023-08-20 DIAGNOSIS — Y999 Unspecified external cause status: Secondary | ICD-10-CM | POA: Diagnosis not present

## 2023-08-20 DIAGNOSIS — W19XXXA Unspecified fall, initial encounter: Secondary | ICD-10-CM | POA: Diagnosis not present

## 2023-08-20 DIAGNOSIS — R0902 Hypoxemia: Principal | ICD-10-CM | POA: Insufficient documentation

## 2023-08-20 DIAGNOSIS — S0993XA Unspecified injury of face, initial encounter: Secondary | ICD-10-CM | POA: Diagnosis not present

## 2023-08-20 DIAGNOSIS — C641 Malignant neoplasm of right kidney, except renal pelvis: Secondary | ICD-10-CM | POA: Diagnosis not present

## 2023-08-20 DIAGNOSIS — R9082 White matter disease, unspecified: Secondary | ICD-10-CM | POA: Diagnosis not present

## 2023-08-20 DIAGNOSIS — I7143 Infrarenal abdominal aortic aneurysm, without rupture: Secondary | ICD-10-CM | POA: Insufficient documentation

## 2023-08-20 DIAGNOSIS — E039 Hypothyroidism, unspecified: Secondary | ICD-10-CM | POA: Insufficient documentation

## 2023-08-20 DIAGNOSIS — R2681 Unsteadiness on feet: Secondary | ICD-10-CM | POA: Insufficient documentation

## 2023-08-20 DIAGNOSIS — Z79899 Other long term (current) drug therapy: Secondary | ICD-10-CM | POA: Insufficient documentation

## 2023-08-20 DIAGNOSIS — R519 Headache, unspecified: Secondary | ICD-10-CM | POA: Diagnosis not present

## 2023-08-20 DIAGNOSIS — M6281 Muscle weakness (generalized): Secondary | ICD-10-CM | POA: Insufficient documentation

## 2023-08-20 DIAGNOSIS — S299XXA Unspecified injury of thorax, initial encounter: Secondary | ICD-10-CM | POA: Diagnosis not present

## 2023-08-20 DIAGNOSIS — S81801A Unspecified open wound, right lower leg, initial encounter: Secondary | ICD-10-CM | POA: Diagnosis present

## 2023-08-20 DIAGNOSIS — R2689 Other abnormalities of gait and mobility: Secondary | ICD-10-CM | POA: Insufficient documentation

## 2023-08-20 DIAGNOSIS — I872 Venous insufficiency (chronic) (peripheral): Secondary | ICD-10-CM | POA: Diagnosis not present

## 2023-08-20 DIAGNOSIS — E11649 Type 2 diabetes mellitus with hypoglycemia without coma: Secondary | ICD-10-CM | POA: Diagnosis not present

## 2023-08-20 DIAGNOSIS — M79642 Pain in left hand: Secondary | ICD-10-CM | POA: Diagnosis not present

## 2023-08-20 DIAGNOSIS — M19042 Primary osteoarthritis, left hand: Secondary | ICD-10-CM | POA: Diagnosis not present

## 2023-08-20 DIAGNOSIS — Z043 Encounter for examination and observation following other accident: Secondary | ICD-10-CM | POA: Diagnosis not present

## 2023-08-20 DIAGNOSIS — I10 Essential (primary) hypertension: Secondary | ICD-10-CM | POA: Diagnosis not present

## 2023-08-20 DIAGNOSIS — F419 Anxiety disorder, unspecified: Secondary | ICD-10-CM | POA: Diagnosis not present

## 2023-08-20 DIAGNOSIS — F411 Generalized anxiety disorder: Secondary | ICD-10-CM | POA: Diagnosis present

## 2023-08-20 DIAGNOSIS — R069 Unspecified abnormalities of breathing: Secondary | ICD-10-CM | POA: Diagnosis not present

## 2023-08-20 DIAGNOSIS — S81802A Unspecified open wound, left lower leg, initial encounter: Secondary | ICD-10-CM | POA: Diagnosis present

## 2023-08-20 DIAGNOSIS — I83028 Varicose veins of left lower extremity with ulcer other part of lower leg: Secondary | ICD-10-CM | POA: Diagnosis not present

## 2023-08-20 DIAGNOSIS — S3993XA Unspecified injury of pelvis, initial encounter: Secondary | ICD-10-CM | POA: Diagnosis not present

## 2023-08-20 DIAGNOSIS — I83018 Varicose veins of right lower extremity with ulcer other part of lower leg: Secondary | ICD-10-CM | POA: Diagnosis not present

## 2023-08-20 DIAGNOSIS — S81801S Unspecified open wound, right lower leg, sequela: Secondary | ICD-10-CM

## 2023-08-20 DIAGNOSIS — S199XXA Unspecified injury of neck, initial encounter: Secondary | ICD-10-CM | POA: Diagnosis not present

## 2023-08-20 DIAGNOSIS — C9110 Chronic lymphocytic leukemia of B-cell type not having achieved remission: Secondary | ICD-10-CM | POA: Diagnosis present

## 2023-08-20 DIAGNOSIS — Z86718 Personal history of other venous thrombosis and embolism: Secondary | ICD-10-CM | POA: Diagnosis not present

## 2023-08-20 DIAGNOSIS — S0990XA Unspecified injury of head, initial encounter: Secondary | ICD-10-CM | POA: Diagnosis not present

## 2023-08-20 DIAGNOSIS — S3991XA Unspecified injury of abdomen, initial encounter: Secondary | ICD-10-CM | POA: Diagnosis not present

## 2023-08-20 LAB — COMPREHENSIVE METABOLIC PANEL WITH GFR
ALT: 14 U/L (ref 0–44)
AST: 18 U/L (ref 15–41)
Albumin: 3.2 g/dL — ABNORMAL LOW (ref 3.5–5.0)
Alkaline Phosphatase: 86 U/L (ref 38–126)
Anion gap: 10 (ref 5–15)
BUN: 15 mg/dL (ref 8–23)
CO2: 29 mmol/L (ref 22–32)
Calcium: 8.6 mg/dL — ABNORMAL LOW (ref 8.9–10.3)
Chloride: 98 mmol/L (ref 98–111)
Creatinine, Ser: 1.16 mg/dL (ref 0.61–1.24)
GFR, Estimated: >60 mL/min (ref >60)
Glucose, Bld: 131 mg/dL — ABNORMAL HIGH (ref 70–99)
Potassium: 3.8 mmol/L (ref 3.5–5.1)
Sodium: 137 mmol/L (ref 135–145)
Total Bilirubin: 0.7 mg/dL (ref 0.0–1.2)
Total Protein: 5.9 g/dL — ABNORMAL LOW (ref 6.5–8.1)

## 2023-08-20 LAB — SAMPLE TO BLOOD BANK

## 2023-08-20 LAB — CBG MONITORING, ED: Glucose-Capillary: 139 mg/dL — ABNORMAL HIGH (ref 70–99)

## 2023-08-20 LAB — URINALYSIS, ROUTINE W REFLEX MICROSCOPIC
Bacteria, UA: NONE SEEN
Bilirubin Urine: NEGATIVE
Glucose, UA: NEGATIVE mg/dL
Hgb urine dipstick: NEGATIVE
Ketones, ur: NEGATIVE mg/dL
Leukocytes,Ua: NEGATIVE
Nitrite: NEGATIVE
Protein, ur: 100 mg/dL — AB
Specific Gravity, Urine: 1.019 (ref 1.005–1.030)
pH: 6 (ref 5.0–8.0)

## 2023-08-20 LAB — CBC
HCT: 40.8 % (ref 39.0–52.0)
Hemoglobin: 13.1 g/dL (ref 13.0–17.0)
MCH: 33.9 pg (ref 26.0–34.0)
MCHC: 32.1 g/dL (ref 30.0–36.0)
MCV: 105.4 fL — ABNORMAL HIGH (ref 80.0–100.0)
Platelets: 241 K/uL (ref 150–400)
RBC: 3.87 MIL/uL — ABNORMAL LOW (ref 4.22–5.81)
RDW: 18 % — ABNORMAL HIGH (ref 11.5–15.5)
WBC: 29.8 K/uL — ABNORMAL HIGH (ref 4.0–10.5)
nRBC: 0.1 % (ref 0.0–0.2)

## 2023-08-20 LAB — PROTIME-INR
INR: 1 (ref 0.8–1.2)
Prothrombin Time: 13.8 s (ref 11.4–15.2)

## 2023-08-20 LAB — LACTIC ACID, PLASMA: Lactic Acid, Venous: 1 mmol/L (ref 0.5–1.9)

## 2023-08-20 MED ORDER — IPRATROPIUM-ALBUTEROL 0.5-2.5 (3) MG/3ML IN SOLN: 3.0000 mL | Freq: Four times a day (QID) | RESPIRATORY_TRACT | Status: DC | PRN

## 2023-08-20 MED ORDER — FENTANYL CITRATE (PF) 100 MCG/2ML IJ SOLN
50.0000 ug | Freq: Once | INTRAMUSCULAR | Status: AC
  Administered 2023-08-20: 50 ug via INTRAVENOUS
  Filled 2023-08-20: qty 2

## 2023-08-20 MED ORDER — MORPHINE SULFATE (PF) 4 MG/ML IV SOLN
4.0000 mg | Freq: Once | INTRAVENOUS | Status: AC
  Administered 2023-08-20: 4 mg via INTRAVENOUS
  Filled 2023-08-20: qty 1

## 2023-08-20 MED ORDER — ATORVASTATIN CALCIUM 40 MG PO TABS
40.0000 mg | ORAL_TABLET | Freq: Every day | ORAL | Status: DC
  Administered 2023-08-20 – 2023-08-22 (×3): 40 mg via ORAL
  Filled 2023-08-20 (×3): qty 1

## 2023-08-20 MED ORDER — FUROSEMIDE 40 MG PO TABS
40.0000 mg | ORAL_TABLET | Freq: Every day | ORAL | Status: DC
  Administered 2023-08-20 – 2023-08-22 (×3): 40 mg via ORAL
  Filled 2023-08-20 (×3): qty 1

## 2023-08-20 MED ORDER — APIXABAN 5 MG PO TABS
5.0000 mg | ORAL_TABLET | Freq: Two times a day (BID) | ORAL | Status: DC
  Administered 2023-08-20 – 2023-08-23 (×6): 5 mg via ORAL
  Filled 2023-08-20 (×6): qty 1

## 2023-08-20 MED ORDER — METHOCARBAMOL 1000 MG/10ML IJ SOLN: 500.0000 mg | Freq: Four times a day (QID) | INTRAMUSCULAR | Status: DC | PRN

## 2023-08-20 MED ORDER — SILVER SULFADIAZINE 1 % EX CREA
1.0000 | TOPICAL_CREAM | Freq: Every day | CUTANEOUS | Status: DC
  Administered 2023-08-21 – 2023-08-23 (×3): 1 via TOPICAL
  Filled 2023-08-20: qty 85

## 2023-08-20 MED ORDER — INSULIN ASPART 100 UNIT/ML IJ SOLN
0.0000 [IU] | Freq: Three times a day (TID) | INTRAMUSCULAR | Status: DC
  Administered 2023-08-21: 1 [IU] via SUBCUTANEOUS

## 2023-08-20 MED ORDER — POLYETHYLENE GLYCOL 3350 17 G PO PACK: 17.0000 g | PACK | Freq: Every day | ORAL | Status: DC | PRN

## 2023-08-20 MED ORDER — SODIUM CHLORIDE 0.9% FLUSH: 3.0000 mL | INTRAVENOUS | Status: DC | PRN

## 2023-08-20 MED ORDER — SODIUM CHLORIDE 0.9 % IV SOLN: 250.0000 mL | INTRAVENOUS | Status: AC | PRN

## 2023-08-20 MED ORDER — ACETAMINOPHEN 325 MG PO TABS: 650.0000 mg | ORAL_TABLET | Freq: Four times a day (QID) | ORAL | Status: DC | PRN

## 2023-08-20 MED ORDER — NYSTATIN 100000 UNIT/GM EX POWD
1.0000 | Freq: Two times a day (BID) | CUTANEOUS | Status: DC
  Administered 2023-08-20 – 2023-08-23 (×6): 1 via TOPICAL
  Filled 2023-08-20 (×2): qty 15

## 2023-08-20 MED ORDER — PROCHLORPERAZINE EDISYLATE 10 MG/2ML IJ SOLN: 5.0000 mg | Freq: Four times a day (QID) | INTRAMUSCULAR | Status: DC | PRN

## 2023-08-20 MED ORDER — GABAPENTIN 100 MG PO CAPS: 100.0000 mg | ORAL_CAPSULE | Freq: Three times a day (TID) | ORAL | Status: DC | PRN

## 2023-08-20 MED ORDER — ESCITALOPRAM OXALATE 10 MG PO TABS
10.0000 mg | ORAL_TABLET | Freq: Every day | ORAL | Status: DC
  Administered 2023-08-21 – 2023-08-23 (×3): 10 mg via ORAL
  Filled 2023-08-20 (×3): qty 1

## 2023-08-20 MED ORDER — SODIUM CHLORIDE 0.9% FLUSH
3.0000 mL | Freq: Two times a day (BID) | INTRAVENOUS | Status: DC
  Administered 2023-08-20 – 2023-08-23 (×4): 3 mL via INTRAVENOUS

## 2023-08-20 MED ORDER — OXYCODONE HCL 5 MG PO TABS
5.0000 mg | ORAL_TABLET | ORAL | Status: DC | PRN
  Administered 2023-08-20 – 2023-08-23 (×7): 10 mg via ORAL
  Filled 2023-08-20 (×7): qty 2

## 2023-08-20 MED ORDER — ACETAMINOPHEN 650 MG RE SUPP: 650.0000 mg | Freq: Four times a day (QID) | RECTAL | Status: DC | PRN

## 2023-08-20 MED ORDER — LEVOTHYROXINE SODIUM 100 MCG PO TABS
100.0000 ug | ORAL_TABLET | Freq: Every day | ORAL | Status: DC
  Administered 2023-08-21 – 2023-08-23 (×3): 100 ug via ORAL
  Filled 2023-08-20 (×3): qty 1

## 2023-08-20 NOTE — ED Triage Notes (Signed)
 Pt BIBEMS. Fell at home outside from 1 step. Fell backwards and hit head on car. CC head pain at this time. Normally RA, requiring 2 LPM [place] per EMS. Unable to maintain C collar due to pt reports of diff breathing.

## 2023-08-20 NOTE — ED Notes (Signed)
 Pt. walked surprisingly well out to the nurses station and down the hall. He was about in tears just when this nurse was taking off his right sock and had to cut it off.. Now he's back in his room and his O2 is at 88% RA and he's c/o pain again. Family at bedside.

## 2023-08-20 NOTE — ED Provider Notes (Signed)
 [place] EMERGENCY DEPARTMENT AT [facility] Provider Note   CSN: [id number] Arrival date & time: 08/20/23  1255     Patient presents with: George Anthony is a 73 y.o. male after having a fall from 1 step, fell backwards after he stated his legs felt weak and he struck the back of his head on the concrete beneath him.  Denies having any loss of consciousness but did have a period of dizziness following the incident.  At presentation he does have a headache, was just at his physician's office for wound management, has lymphedema in the bilateral lower extremities as well as venous stasis ulcers on the medial lower extremities.  It was also noted this patient has a history of chronic lymphocytic leukemia, renal cell carcinoma of the right kidney, and type 2 diabetes.  Manage previously for bilateral cellulitis of the distal lower extremities, was discharged from this facility on 27 June for the same.  Has been undergoing follow-up for the wounds that resulted from this.    Fall       Prior to Admission medications   Medication Sig Start Date End Date Taking? Authorizing Provider  acetaminophen  (TYLENOL ) 500 MG tablet Take 1,300 mg by mouth every 6 (six) hours as needed for moderate pain. 650 mg tab    [provider]  atorvastatin  (LIPITOR) 40 MG tablet TAKE 1 TABLET BY MOUTH AT BEDTIME 08/17/23   Rakes, Rock HERO, FNP  benzonatate  (TESSALON  PERLES) 100 MG capsule Take 2 capsules (200 mg total) by mouth 3 (three) times daily as needed. 10/16/22   Severa Rock HERO, FNP  clobetasol  cream (TEMOVATE ) 0.05 % Apply 1 Application topically 2 (two) times daily. 08/14/23   [name], MD  ELIQUIS  5 MG TABS tablet Take 1 tablet by mouth twice daily 07/01/23   Severa Rock HERO, FNP  escitalopram  (LEXAPRO ) 10 MG tablet Take 1 tablet by mouth once daily 05/14/23   Severa Rock HERO, FNP  furosemide  (LASIX ) 20 MG tablet Take 2 tablets (40 mg total) by mouth daily. 07/10/23    Kandala, Hyndavi, MD  gabapentin  (NEURONTIN ) 100 MG capsule Take 1 capsule (100 mg total) by mouth 3 (three) times daily as needed. 07/28/23   Severa Rock HERO, FNP  lenvatinib  10 mg daily dose (LENVIMA , 10 MG DAILY DOSE,) capsule Take 1 capsule (10 mg total) by mouth daily. 07/03/23   Kandala, Hyndavi, MD  levothyroxine  (SYNTHROID ) 50 MCG tablet Take 2 tablets (100 mcg total) by mouth daily before breakfast. 07/27/23   [name], MD  nystatin  (MYCOSTATIN /NYSTOP ) powder Apply 1 Application topically 2 (two) times daily. 02/23/23   [provider]  prochlorperazine  (COMPAZINE ) 10 MG tablet Take 10 mg by mouth every 6 (six) hours as needed. 06/28/23   [provider]  silver  sulfADIAZINE  (SILVADENE ) 1 % cream Apply 1 Application topically daily. 08/04/23   Severa Rock HERO, FNP  terbinafine  (LAMISIL ) 1 % cream Apply 1 Application topically 2 (two) times daily. 06/11/23   Kandala, Hyndavi, MD    Allergies: Patient has no known allergies.    Review of Systems  Cardiovascular:  Positive for leg swelling.  Musculoskeletal:  Positive for back pain and neck pain.  Neurological:  Positive for weakness.  All other systems reviewed and are negative.   Updated Vital Signs BP 126/74   Pulse 71   Temp 98.5 F (36.9 C) (Axillary)   Resp 18   Ht 5' 7 (1.702 m)   Wt 93  kg   SpO2 97%   BMI 32.11 kg/m   Physical Exam Vitals and nursing note reviewed.  Constitutional:      General: He is not in acute distress.    Appearance: Normal appearance. He is obese.     Interventions: Cervical collar in place.  HENT:     Head: Normocephalic.     Comments: Tenderness elicited to the occiput.  Ecchymosis noted to the nasal bridge with tenderness.    Right Ear: Hearing, tympanic membrane, ear canal and external ear normal.     Left Ear: Tympanic membrane, ear canal and external ear normal.     Nose: Nose normal.     Comments: Noted obvious ruptured vessel to the surface of the mucosa on the left  naris/septum.    Mouth/Throat:     Mouth: Mucous membranes are moist.     Pharynx: Oropharynx is clear.  Eyes:     General: Lids are normal. Vision grossly intact. Gaze aligned appropriately. No visual field deficit.    Extraocular Movements: Extraocular movements intact.     Conjunctiva/sclera: Conjunctivae normal.     Pupils: Pupils are equal, round, and reactive to light.  Neck:     Trachea: Trachea normal.  Cardiovascular:     Rate and Rhythm: Normal rate and regular rhythm.     Pulses: Normal pulses.     Heart sounds: Normal heart sounds, S1 normal and S2 normal. No murmur heard.    No friction rub. No gallop.  Pulmonary:     Effort: Pulmonary effort is normal.     Breath sounds: Normal breath sounds and air entry.  Abdominal:     General: Abdomen is flat. Bowel sounds are normal.     Palpations: Abdomen is soft.     Tenderness: There is no abdominal tenderness.  Musculoskeletal:        General: Normal range of motion.     Left hand: Swelling and tenderness present.     Cervical back: Normal range of motion and neck supple. Muscular tenderness present.     Right lower leg: 3+ Edema present.     Left lower leg: 3+ Edema present.       Legs:     Comments: Multiple bandaged wounds from previous wound care visit today.  Both upper extremities are edematous.  Swelling and tenderness to the 4th and 5th digits on the left hand is appreciated without gross deformity noted.  Skin:    General: Skin is warm and dry.     Capillary Refill: Capillary refill takes less than 2 seconds.  Neurological:     General: No focal deficit present.     Mental Status: He is alert and oriented to person, place, and time. Mental status is at baseline.     GCS: GCS eye subscore is 4. GCS verbal subscore is 5. GCS motor subscore is 6.     Cranial Nerves: No cranial nerve deficit, dysarthria or facial asymmetry.     Sensory: Sensation is intact.     Motor: Motor function is intact.     Coordination:  Coordination is intact.  Psychiatric:        Mood and Affect: Mood normal.     (all labs ordered are listed, but only abnormal results are displayed) Labs Reviewed  COMPREHENSIVE METABOLIC PANEL WITH GFR - Abnormal; Notable for the following components:      Result Value   Glucose, Bld 131 (*)    Calcium  8.6 (*)  Total Protein 5.9 (*)    Albumin 3.2 (*)    All other components within normal limits  CBC - Abnormal; Notable for the following components:   WBC 29.8 (*)    RBC 3.87 (*)    MCV 105.4 (*)    RDW 18.0 (*)    All other components within normal limits  URINALYSIS, ROUTINE W REFLEX MICROSCOPIC - Abnormal; Notable for the following components:   Protein, ur 100 (*)    All other components within normal limits  CBG MONITORING, ED - Abnormal; Notable for the following components:   Glucose-Capillary 139 (*)    All other components within normal limits  LACTIC ACID, PLASMA  PROTIME-INR  SAMPLE TO BLOOD BANK    EKG: None  Radiology: CT CHEST ABDOMEN PELVIS WO CONTRAST Result Date: 08/20/2023 CLINICAL DATA:  Blunt trauma. Renal cell carcinoma status post prior right nephrectomy. EXAM: CT CHEST, ABDOMEN AND PELVIS WITHOUT CONTRAST TECHNIQUE: Multidetector CT imaging of the chest, abdomen and pelvis was performed following the standard protocol without IV contrast. RADIATION DOSE REDUCTION: This exam was performed according to the departmental dose-optimization program which includes automated exposure control, adjustment of the mA and/or kV according to patient size and/or use of iterative reconstruction technique. COMPARISON:  CT dated 07/13/2023. FINDINGS: Evaluation of this exam is limited in the absence of intravenous contrast. CT CHEST FINDINGS Cardiovascular: There is no cardiomegaly or pericardial effusion. There is coronary vascular calcification. Mild atherosclerotic calcification of the thoracic aorta. No aneurysmal dilatation. The central pulmonary arteries are  grossly unremarkable. Mediastinum/Nodes: No hilar or mediastinal adenopathy. The esophagus is grossly unremarkable. No mediastinal fluid collection. Lungs/Pleura: Interval decrease in the size of the subpleural nodule in the medial right lung base measuring approximately 1 cm in length on today's exam (previously 16 mm). No new nodule. Bibasilar atelectasis. No focal consolidation, pleural effusion, or pneumothorax. The central airways are patent. Musculoskeletal: Median sternotomy wires. Degenerative changes of the spine. Compression fractures of T5 and T11 as seen on the prior CT. No new fracture. CT ABDOMEN PELVIS FINDINGS No intra-abdominal free air or free fluid. Hepatobiliary: The liver is unremarkable. No biliary dilatation. Cholecystectomy. No retained calcified stone noted in the central CBD. Pancreas: Unremarkable. No pancreatic ductal dilatation or surrounding inflammatory changes. Spleen: Normal in size without focal abnormality. Adrenals/Urinary Tract: The right adrenal glands unremarkable. Similar or slightly decreased nodule in the left adrenal gland. Status post prior right nephrectomy. No suspicious nodule noted in the nephrectomy bed. Small left renal upper pole cyst. No hydronephrosis or nephrolithiasis on the left. The left ureter and urinary bladder appear unremarkable. Stomach/Bowel: There is moderate stool throughout the colon. There is no bowel obstruction or active inflammation. Several small scattered colonic diverticula noted. The appendix is normal. Vascular/Lymphatic: Moderate aortoiliac atherosclerotic disease. There is a 3.4 cm infrarenal abdominal aortic aneurysm. The IVC is unremarkable. No portal venous gas. There is no adenopathy. Reproductive: The prostate and seminal vesicles are grossly remarkable. Other: Small fat containing left spigelian hernia. Musculoskeletal: Degenerative changes.  No acute osseous pathology. IMPRESSION: 1. No acute/traumatic intrathoracic, abdominal, or  pelvic pathology. 2. Interval decrease in the size of the subpleural nodule in the medial right lung base. 3. Status post prior right nephrectomy. No suspicious nodule noted in the nephrectomy bed. 4. Similar or slightly decreased nodule in the left adrenal gland. 5. A 3.4 cm infrarenal abdominal aortic aneurysm. Recommend follow-up ultrasound every 3 years. (Ref.: J Vasc Surg. 2018; 67:2-77 and J Am Coll Radiol 2013;10(10):789-794.)  6.  Aortic Atherosclerosis (ICD10-I70.0). Electronically Signed   By: Vanetta Chou M.D.   On: 08/20/2023 17:17   CT CERVICAL SPINE WO CONTRAST Result Date: 08/20/2023 CLINICAL DATA:  Polytrauma, blunt.  Fall. EXAM: CT CERVICAL SPINE WITHOUT CONTRAST TECHNIQUE: Multidetector CT imaging of the cervical spine was performed without intravenous contrast. Multiplanar CT image reconstructions were also generated. RADIATION DOSE REDUCTION: This exam was performed according to the departmental dose-optimization program which includes automated exposure control, adjustment of the mA and/or kV according to patient size and/or use of iterative reconstruction technique. COMPARISON:  08/09/2020 FINDINGS: Alignment: Normal Skull base and vertebrae: No acute fracture. No primary bone lesion or focal pathologic process. Soft tissues and spinal canal: No prevertebral fluid or swelling. No visible canal hematoma. No acute findings Disc levels: Early disc space narrowing and spurring in the mid cervical spine. Mild bilateral degenerative facet disease. Upper chest: No acute findings Other: None IMPRESSION: No acute bony abnormality. Electronically Signed   By: Franky Crease M.D.   On: 08/20/2023 17:13   CT MAXILLOFACIAL WO CONTRAST Result Date: 08/20/2023 CLINICAL DATA:  Facial trauma, blunt EXAM: CT MAXILLOFACIAL WITHOUT CONTRAST TECHNIQUE: Multidetector CT imaging of the maxillofacial structures was performed. Multiplanar CT image reconstructions were also generated. RADIATION DOSE REDUCTION:  This exam was performed according to the departmental dose-optimization program which includes automated exposure control, adjustment of the mA and/or kV according to patient size and/or use of iterative reconstruction technique. COMPARISON:  None Available. FINDINGS: Osseous: No fracture or mandibular dislocation. No destructive process. Orbits: Negative. No traumatic or inflammatory finding. Sinuses: Clear Soft tissues: Negative Limited intracranial: See head CT report IMPRESSION: No facial or orbital fracture. Electronically Signed   By: Franky Crease M.D.   On: 08/20/2023 17:11   CT HEAD WO CONTRAST Result Date: 08/20/2023 CLINICAL DATA:  Fall, hit head.  Headache. EXAM: CT HEAD WITHOUT CONTRAST TECHNIQUE: Contiguous axial images were obtained from the base of the skull through the vertex without intravenous contrast. RADIATION DOSE REDUCTION: This exam was performed according to the departmental dose-optimization program which includes automated exposure control, adjustment of the mA and/or kV according to patient size and/or use of iterative reconstruction technique. COMPARISON:  None Available. FINDINGS: Brain: Low-density throughout the white matter compatible with chronic small vessel disease. No acute intracranial abnormality. Specifically, no hemorrhage, hydrocephalus, mass lesion, acute infarction, or significant intracranial injury. Vascular: No hyperdense vessel or unexpected calcification. Skull: No acute calvarial abnormality. Sinuses/Orbits: No acute findings Other: None IMPRESSION: Chronic small vessel disease throughout the deep white matter. No acute intracranial abnormality. Electronically Signed   By: Franky Crease M.D.   On: 08/20/2023 17:09   DG Pelvis Portable Result Date: 08/20/2023 CLINICAL DATA:  Pain after fall. EXAM: PORTABLE PELVIS 1-2 VIEWS COMPARISON:  None Available. FINDINGS: There is no evidence of acute pelvic fracture or diastasis. Evaluation of the sacrum is slightly  limited by overlying bowel-gas. Femoral heads are seated within the acetabula. Mild joint space narrowing of the bilateral hips. IMPRESSION: No acute osseous abnormality identified. Electronically Signed   By: Harrietta Sherry M.D.   On: 08/20/2023 16:03   DG Hand Complete Left Result Date: 08/20/2023 CLINICAL DATA:  Pain after fall. EXAM: LEFT HAND - COMPLETE 3+ VIEW COMPARISON:  None Available. FINDINGS: Cortical angulation with subtle lucency at the ulnar base of the fourth middle phalanx is equivocal for a nondisplaced intra-articular fracture. The remainder of the visualized bones are intact. Borderline widening of the scapholunate interval. Mild radiocarpal and  first [facility] joint space narrowing and spurring. Diffuse interphalangeal joint space narrowing, most pronounced at the first interphalangeal joint. No radiopaque foreign body. IMPRESSION: 1. Cortical angulation with subtle lucency at the ulnar base of the fourth middle phalanx is equivocal for a nondisplaced intra-articular fracture. Consider further evaluation with CT. 2. Borderline widening of the scapholunate interval, suggestive of age-indeterminate scapholunate ligamentous injury. 3. Mild osteoarthritis. Electronically Signed   By: Harrietta Sherry M.D.   On: 08/20/2023 16:02   DG Chest Port 1 View Result Date: 08/20/2023 CLINICAL DATA:  Fall. EXAM: PORTABLE CHEST 1 VIEW COMPARISON:  07/16/2023. FINDINGS: The heart size and mediastinal contours are unchanged. Prior median sternotomy. No focal consolidation, pleural effusion, or pneumothorax. No acute osseous abnormality. IMPRESSION: No acute findings in the chest. Electronically Signed   By: Harrietta Sherry M.D.   On: 08/20/2023 15:54     Procedures   Medications Ordered in the ED  sodium chloride  flush (NS) 0.9 % injection 3 mL (3 mLs Intravenous Given 08/20/23 1603)  sodium chloride  flush (NS) 0.9 % injection 3 mL (has no administration in time range)  0.9 %  sodium chloride  infusion  (has no administration in time range)  fentaNYL  (SUBLIMAZE ) injection 50 mcg (50 mcg Intravenous Given 08/20/23 1437)  morphine  (PF) 4 MG/ML injection 4 mg (4 mg Intravenous Given 08/20/23 1602)                                    Medical Decision Making Amount and/or Complexity of Data Reviewed Labs: ordered. Radiology: ordered.  Risk Prescription drug management.   Medical Decision Making:   George Anthony is a 73 y.o. male who presented to the ED today with weakness/fall detailed above.    Additional history discussed with patient's family/caregivers.  External chart has been reviewed including previous labs and imaging as well as outpatient facility records. Patient's presentation is complicated by their history of CLL, type 2 diabetes.  Patient placed on continuous vitals and telemetry monitoring while in ED which was reviewed periodically.  Complete initial physical exam performed, notably the patient  was alert and oriented in no apparent distress.  Physical exam as noted with notable exam findings as addressed under physical exam..    Reviewed and confirmed nursing documentation for past medical history, family history, social history.    Initial Assessment:   With the patient's presentation of weakness/fall, back pain and headache, differential diagnosis at this time includes acute intracranial bleed, acute bony injury to the the cranium and/or C-spine and other spinal structures.  Secondary to weakness, differential includes fluid volume deficit, acute exacerbation of previously diagnosed and managed cellulitis.    Initial Plan:  Obtain CT imaging of the head/neck/maxillofacial structures secondary to exam findings suggestive of injury to those areas. Obtain CT imaging of the pelvis and chest to assess for acute injuries to the same. Obtain plain film imaging's of the left hand secondary to exam findings of 5th and 4th digit swelling and pain Screening labs including CBC  and Metabolic panel to evaluate for infectious or metabolic etiology of disease.  As patient is on Eliquis , assess pro time and INR to assess coagulation status secondary to fall, comprehensive evaluation of hemorrhage risk. Urinalysis with reflex culture ordered to evaluate for UTI or relevant urologic/nephrologic pathology.  EKG to evaluate for cardiac pathology Objective evaluation as below reviewed   Initial Study Results:   Laboratory  All laboratory results reviewed without evidence of clinically relevant pathology.   Exceptions include: Patient has a leukocytosis of 2 29.8 however this is stable compared to 1 week prior, had previously been on antibiotic therapy for cellulitis.  Total protein is decreased to 5.9.  EKG EKG was reviewed independently. Rate, rhythm, axis, intervals all examined and without medically relevant abnormality. ST segments without concerns for elevations.    Radiology:  All images reviewed independently. Agree with radiology report at this time.   CT CHEST ABDOMEN PELVIS WO CONTRAST Result Date: 08/20/2023 CLINICAL DATA:  Blunt trauma. Renal cell carcinoma status post prior right nephrectomy. EXAM: CT CHEST, ABDOMEN AND PELVIS WITHOUT CONTRAST TECHNIQUE: Multidetector CT imaging of the chest, abdomen and pelvis was performed following the standard protocol without IV contrast. RADIATION DOSE REDUCTION: This exam was performed according to the departmental dose-optimization program which includes automated exposure control, adjustment of the mA and/or kV according to patient size and/or use of iterative reconstruction technique. COMPARISON:  CT dated 07/13/2023. FINDINGS: Evaluation of this exam is limited in the absence of intravenous contrast. CT CHEST FINDINGS Cardiovascular: There is no cardiomegaly or pericardial effusion. There is coronary vascular calcification. Mild atherosclerotic calcification of the thoracic aorta. No aneurysmal dilatation. The central  pulmonary arteries are grossly unremarkable. Mediastinum/Nodes: No hilar or mediastinal adenopathy. The esophagus is grossly unremarkable. No mediastinal fluid collection. Lungs/Pleura: Interval decrease in the size of the subpleural nodule in the medial right lung base measuring approximately 1 cm in length on today's exam (previously 16 mm). No new nodule. Bibasilar atelectasis. No focal consolidation, pleural effusion, or pneumothorax. The central airways are patent. Musculoskeletal: Median sternotomy wires. Degenerative changes of the spine. Compression fractures of T5 and T11 as seen on the prior CT. No new fracture. CT ABDOMEN PELVIS FINDINGS No intra-abdominal free air or free fluid. Hepatobiliary: The liver is unremarkable. No biliary dilatation. Cholecystectomy. No retained calcified stone noted in the central CBD. Pancreas: Unremarkable. No pancreatic ductal dilatation or surrounding inflammatory changes. Spleen: Normal in size without focal abnormality. Adrenals/Urinary Tract: The right adrenal glands unremarkable. Similar or slightly decreased nodule in the left adrenal gland. Status post prior right nephrectomy. No suspicious nodule noted in the nephrectomy bed. Small left renal upper pole cyst. No hydronephrosis or nephrolithiasis on the left. The left ureter and urinary bladder appear unremarkable. Stomach/Bowel: There is moderate stool throughout the colon. There is no bowel obstruction or active inflammation. Several small scattered colonic diverticula noted. The appendix is normal. Vascular/Lymphatic: Moderate aortoiliac atherosclerotic disease. There is a 3.4 cm infrarenal abdominal aortic aneurysm. The IVC is unremarkable. No portal venous gas. There is no adenopathy. Reproductive: The prostate and seminal vesicles are grossly remarkable. Other: Small fat containing left spigelian hernia. Musculoskeletal: Degenerative changes.  No acute osseous pathology. IMPRESSION: 1. No acute/traumatic  intrathoracic, abdominal, or pelvic pathology. 2. Interval decrease in the size of the subpleural nodule in the medial right lung base. 3. Status post prior right nephrectomy. No suspicious nodule noted in the nephrectomy bed. 4. Similar or slightly decreased nodule in the left adrenal gland. 5. A 3.4 cm infrarenal abdominal aortic aneurysm. Recommend follow-up ultrasound every 3 years. (Ref.: J Vasc Surg. 2018; 67:2-77 and J Am Coll Radiol 2013;10(10):789-794.) 6.  Aortic Atherosclerosis (ICD10-I70.0). Electronically Signed   By: Vanetta Chou M.D.   On: 08/20/2023 17:17   CT CERVICAL SPINE WO CONTRAST Result Date: 08/20/2023 CLINICAL DATA:  Polytrauma, blunt.  Fall. EXAM: CT CERVICAL SPINE WITHOUT CONTRAST TECHNIQUE: Multidetector CT  imaging of the cervical spine was performed without intravenous contrast. Multiplanar CT image reconstructions were also generated. RADIATION DOSE REDUCTION: This exam was performed according to the departmental dose-optimization program which includes automated exposure control, adjustment of the mA and/or kV according to patient size and/or use of iterative reconstruction technique. COMPARISON:  08/09/2020 FINDINGS: Alignment: Normal Skull base and vertebrae: No acute fracture. No primary bone lesion or focal pathologic process. Soft tissues and spinal canal: No prevertebral fluid or swelling. No visible canal hematoma. No acute findings Disc levels: Early disc space narrowing and spurring in the mid cervical spine. Mild bilateral degenerative facet disease. Upper chest: No acute findings Other: None IMPRESSION: No acute bony abnormality. Electronically Signed   By: Franky Crease M.D.   On: 08/20/2023 17:13   CT MAXILLOFACIAL WO CONTRAST Result Date: 08/20/2023 CLINICAL DATA:  Facial trauma, blunt EXAM: CT MAXILLOFACIAL WITHOUT CONTRAST TECHNIQUE: Multidetector CT imaging of the maxillofacial structures was performed. Multiplanar CT image reconstructions were also generated.  RADIATION DOSE REDUCTION: This exam was performed according to the departmental dose-optimization program which includes automated exposure control, adjustment of the mA and/or kV according to patient size and/or use of iterative reconstruction technique. COMPARISON:  None Available. FINDINGS: Osseous: No fracture or mandibular dislocation. No destructive process. Orbits: Negative. No traumatic or inflammatory finding. Sinuses: Clear Soft tissues: Negative Limited intracranial: See head CT report IMPRESSION: No facial or orbital fracture. Electronically Signed   By: Franky Crease M.D.   On: 08/20/2023 17:11   CT HEAD WO CONTRAST Result Date: 08/20/2023 CLINICAL DATA:  Fall, hit head.  Headache. EXAM: CT HEAD WITHOUT CONTRAST TECHNIQUE: Contiguous axial images were obtained from the base of the skull through the vertex without intravenous contrast. RADIATION DOSE REDUCTION: This exam was performed according to the departmental dose-optimization program which includes automated exposure control, adjustment of the mA and/or kV according to patient size and/or use of iterative reconstruction technique. COMPARISON:  None Available. FINDINGS: Brain: Low-density throughout the white matter compatible with chronic small vessel disease. No acute intracranial abnormality. Specifically, no hemorrhage, hydrocephalus, mass lesion, acute infarction, or significant intracranial injury. Vascular: No hyperdense vessel or unexpected calcification. Skull: No acute calvarial abnormality. Sinuses/Orbits: No acute findings Other: None IMPRESSION: Chronic small vessel disease throughout the deep white matter. No acute intracranial abnormality. Electronically Signed   By: Franky Crease M.D.   On: 08/20/2023 17:09   DG Pelvis Portable Result Date: 08/20/2023 CLINICAL DATA:  Pain after fall. EXAM: PORTABLE PELVIS 1-2 VIEWS COMPARISON:  None Available. FINDINGS: There is no evidence of acute pelvic fracture or diastasis. Evaluation of the  sacrum is slightly limited by overlying bowel-gas. Femoral heads are seated within the acetabula. Mild joint space narrowing of the bilateral hips. IMPRESSION: No acute osseous abnormality identified. Electronically Signed   By: Harrietta Sherry M.D.   On: 08/20/2023 16:03   DG Hand Complete Left Result Date: 08/20/2023 CLINICAL DATA:  Pain after fall. EXAM: LEFT HAND - COMPLETE 3+ VIEW COMPARISON:  None Available. FINDINGS: Cortical angulation with subtle lucency at the ulnar base of the fourth middle phalanx is equivocal for a nondisplaced intra-articular fracture. The remainder of the visualized bones are intact. Borderline widening of the scapholunate interval. Mild radiocarpal and first CMC joint space narrowing and spurring. Diffuse interphalangeal joint space narrowing, most pronounced at the first interphalangeal joint. No radiopaque foreign body. IMPRESSION: 1. Cortical angulation with subtle lucency at the ulnar base of the fourth middle phalanx is equivocal for a nondisplaced intra-articular  fracture. Consider further evaluation with CT. 2. Borderline widening of the scapholunate interval, suggestive of age-indeterminate scapholunate ligamentous injury. 3. Mild osteoarthritis. Electronically Signed   By: Harrietta Sherry M.D.   On: 08/20/2023 16:02   DG Chest Port 1 View Result Date: 08/20/2023 CLINICAL DATA:  Fall. EXAM: PORTABLE CHEST 1 VIEW COMPARISON:  07/16/2023. FINDINGS: The heart size and mediastinal contours are unchanged. Prior median sternotomy. No focal consolidation, pleural effusion, or pneumothorax. No acute osseous abnormality. IMPRESSION: No acute findings in the chest. Electronically Signed   By: Harrietta Sherry M.D.   On: 08/20/2023 15:54    Reassessment and Plan:   Reevaluated patient injected and obtained, lab evaluation does not show any acute abnormalities, there is a mild leukocytosis however this is stable compared to findings from 1 week prior.  He has continued  cellulitis in the bilateral lower extremities without any change, in fact improved from previous notations.  Extensive imaging studies do not show any acute injury other than a nondisplaced fourth metacarpal fracture of the left hand.  As such, this was managed with an ulnar gutter splint.  Plan to have him follow-up with orthopedics in 2 weeks to reevaluate.  Today ambulate the patient, he is able to ambulate without assistance however during ambulation had a desaturation to 88% on room air.  He does not have a previous oxygen requirement, no history of COPD or asthma, he does have a history of renal cell carcinoma with pulmonary metastasis though CT does not demonstrate any new nodules.  Secondary to the inability to tolerate ambulation without desaturation and likely etiology of his weakness and fall, will consult with hospitalist team for admission to this hospital for new oxygen requirement.       Final diagnoses:  Nondisplaced fracture of base of fourth metacarpal bone, left hand, initial encounter for closed fracture  Fall, initial encounter  Hypoxia    ED Discharge Orders     None          Myriam Dorn BROCKS, [place] 08/20/23 CATHLYN Freddi Hamilton, MD 08/27/23 [phone]

## 2023-08-20 NOTE — H&P (Signed)
 History and Physical    George Anthony FMW:[id number] DOB: [birth year] DOA: 08/20/2023  PCP: Severa Rock HERO, FNP   Patient coming from: Home   Chief Complaint: Fall, headache  HPI: George Anthony is a 73 y.o. male with medical history significant for CLL under observation, metastatic RCC status post nephrectomy on chemotherapy, DVT on Eliquis , diet-controlled diabetes mellitus, hyperlipidemia, hypothyroidism, and lower extremity dermatitis with wounds who presents with headache after a fall at home.    Patient reports that he lost his balance and fell backwards, hitting the back of his head.  He denies losing consciousness.  He denies focal numbness or weakness.  He denies feeling short of breath or coughing.  He denies fever or chills.  He denies chest pain.  EMS found the patient to be hypoxic, placed on 2 L/min of supplemental oxygen, and brought him into the ED.  ED Course: Upon arrival to the ED, patient is found to be afebrile and saturating well on 2 L/min of supplemental oxygen with normal HR and stable BP.  Labs are most notable for creatinine 1.16, WBC 29,800, and normal lactic acid.  There are no acute findings on head CT, cervical spine CT, or maxillofacial CT.  CT of the chest, abdomen, and pelvis is notable for infrarenal AAA.  Plain radiographs are concerning for possible fourth phalanx fracture on the left hand.  Patient was treated with fentanyl  and morphine  and the left hand was immobilized with a splint.  Review of Systems:  All other systems reviewed and apart from HPI, are negative.  Past Medical History:  Diagnosis Date   Anxiety    Diabetes mellitus without complication (HCC)    Diabetic nephropathy associated with type 2 diabetes mellitus (HCC) 02/21/2021   DVT (deep venous thrombosis) (HCC)    Enlarged heart    Hyperlipidemia    Hypertension    Renal cell carcinoma [facility])     Past Surgical History:  Procedure Laterality Date   BIOPSY  09/16/2019    Procedure: BIOPSY;  Surgeon: Cindie Carlin POUR, DO;  Location: [facility];  Service: Endoscopy;;   COLONOSCOPY WITH PROPOFOL  N/A 09/16/2019   Procedure: COLONOSCOPY WITH PROPOFOL ;  Surgeon: Cindie Carlin POUR, DO;  Location: [facility];  Service: Endoscopy;  Laterality: N/A;  7:30am   POLYPECTOMY  09/16/2019   Procedure: POLYPECTOMY;  Surgeon: Cindie Carlin POUR, DO;  Location: [facility];  Service: Endoscopy;;    Social History:   reports that he has been smoking cigarettes. He has a 40 pack-year smoking history. He has never used smokeless tobacco. He reports current alcohol use of about 3.0 standard drinks of alcohol per week. He reports that he does not use drugs.  No Known Allergies  Family History  Problem Relation Age of Onset   Emphysema Mother    Drug abuse Sister    Colon cancer Neg Hx      Prior to Admission medications   Medication Sig Start Date End Date Taking? Authorizing Provider  acetaminophen  (TYLENOL ) 500 MG tablet Take 1,300 mg by mouth every 6 (six) hours as needed for moderate pain. 650 mg tab   Yes [provider]  atorvastatin  (LIPITOR) 40 MG tablet TAKE 1 TABLET BY MOUTH AT BEDTIME 08/17/23  Yes Rakes, Rock HERO, FNP  clobetasol  cream (TEMOVATE ) 0.05 % Apply 1 Application topically 2 (two) times daily. 08/14/23  Yes [name], MD  ELIQUIS  5 MG TABS tablet Take 1 tablet by mouth twice daily 07/01/23  Yes Severa Rock HERO, FNP  escitalopram  (LEXAPRO ) 10 MG tablet Take 1 tablet by mouth once daily 05/14/23  Yes Rakes, Rock HERO, FNP  furosemide  (LASIX ) 20 MG tablet Take 2 tablets (40 mg total) by mouth daily. 07/10/23  Yes [name], MD  gabapentin  (NEURONTIN ) 100 MG capsule Take 1 capsule (100 mg total) by mouth 3 (three) times daily as needed. 07/28/23  Yes RakesRock HERO, FNP  lenvatinib  10 mg daily dose (LENVIMA , 10 MG DAILY DOSE,) capsule Take 1 capsule (10 mg total) by mouth daily. 07/03/23  Yes Kandala, Hyndavi, MD  levothyroxine  (SYNTHROID ) 50  MCG tablet Take 2 tablets (100 mcg total) by mouth daily before breakfast. 07/27/23  Yes [name], MD  nystatin  (MYCOSTATIN /NYSTOP ) powder Apply 1 Application topically 2 (two) times daily. 02/23/23  Yes [provider]  prochlorperazine  (COMPAZINE ) 10 MG tablet Take 10 mg by mouth every 6 (six) hours as needed. 06/28/23  Yes [provider]  silver  sulfADIAZINE  (SILVADENE ) 1 % cream Apply 1 Application topically daily. 08/04/23  Yes Rakes, Rock HERO, FNP  benzonatate  (TESSALON  PERLES) 100 MG capsule Take 2 capsules (200 mg total) by mouth 3 (three) times daily as needed. Patient not taking: Reported on 08/20/2023 10/16/22   Severa Rock HERO, FNP  terbinafine  (LAMISIL ) 1 % cream Apply 1 Application topically 2 (two) times daily. Patient not taking: Reported on 08/20/2023 06/11/23   [name], MD    Physical Exam: Vitals:   08/20/23 1700 08/20/23 1737 08/20/23 2043 08/20/23 2101  BP:  126/74  105/71  Pulse: 83 71  85  Resp: (!) 23 18  20   Temp:  98.5 F (36.9 C)  (!) 97.5 F (36.4 C)  TempSrc:  Axillary  Oral  SpO2: 97% 97%  96%  Weight:   92.2 kg   Height:   5' 7 (1.702 m)     Constitutional: NAD, no pallor or diaphoresis   Eyes: PERTLA, lids and conjunctivae normal ENMT: Mucous membranes are moist. Posterior pharynx clear of any exudate or lesions.   Neck: supple, no masses  Respiratory: Speaking full sentences. No rales. Occasional wheeze. No accessory muscle use.  Cardiovascular: S1 & S2 heard, regular rate and rhythm. No JVD. Abdomen: No tenderness, soft. Bowel sounds active.  Musculoskeletal: no clubbing / cyanosis. Left hand tenderness, neurovascularly intact.   Skin: no significant rashes, lesions, ulcers.  Bilateral LE dermatitis and wounds. Neurologic: CN 2-12 grossly intact. Moving all extremities. Alert and oriented.  Psychiatric: Calm. Cooperative.    Labs and Imaging on Admission: I have personally reviewed following labs and imaging  studies  CBC: Recent Labs  Lab 08/14/23 0908 08/20/23 1315  WBC 29.9* 29.8*  NEUTROABS 9.6*  --   HGB 13.1 13.1  HCT 39.2 40.8  MCV 101.0* 105.4*  PLT 136* 241   Basic Metabolic Panel: Recent Labs  Lab 08/14/23 0908 08/20/23 1315  NA 131* 137  K 3.2* 3.8  CL 92* 98  CO2 26 29  GLUCOSE 192* 131*  BUN 15 15  CREATININE 1.23 1.16  CALCIUM  7.9* 8.6*   GFR: Estimated Creatinine Clearance: 62.3 mL/min (by C-G formula based on SCr of 1.16 mg/dL). Liver Function Tests: Recent Labs  Lab 08/14/23 0908 08/20/23 1315  AST 19 18  ALT 15 14  ALKPHOS 110 86  BILITOT 0.6 0.7  PROT 6.0* 5.9*  ALBUMIN 3.2* 3.2*   No results for input(s): LIPASE, AMYLASE in the last 168 hours. No results for input(s): AMMONIA in the last  168 hours. Coagulation Profile: Recent Labs  Lab 08/20/23 1315  INR 1.0   Cardiac Enzymes: No results for input(s): CKTOTAL, CKMB, CKMBINDEX, TROPONINI in the last 168 hours. BNP (last 3 results) No results for input(s): PROBNP in the last 8760 hours. HbA1C: No results for input(s): HGBA1C in the last 72 hours. CBG: Recent Labs  Lab 08/20/23 1346  GLUCAP 139*   Lipid Profile: No results for input(s): CHOL, HDL, LDLCALC, TRIG, CHOLHDL, LDLDIRECT in the last 72 hours. Thyroid  Function Tests: No results for input(s): TSH, T4TOTAL, FREET4, T3FREE, THYROIDAB in the last 72 hours. Anemia Panel: No results for input(s): VITAMINB12, FOLATE, FERRITIN, TIBC, IRON , RETICCTPCT in the last 72 hours. Urine analysis:    Component Value Date/Time   COLORURINE YELLOW 08/20/2023 1604   APPEARANCEUR CLEAR 08/20/2023 1604   LABSPEC 1.019 08/20/2023 1604   PHURINE 6.0 08/20/2023 1604   GLUCOSEU NEGATIVE 08/20/2023 1604   HGBUR NEGATIVE 08/20/2023 1604   BILIRUBINUR NEGATIVE 08/20/2023 1604   KETONESUR NEGATIVE 08/20/2023 1604   PROTEINUR 100 (A) 08/20/2023 1604   UROBILINOGEN 2.0 (H) 11/19/2011 0158    NITRITE NEGATIVE 08/20/2023 1604   LEUKOCYTESUR NEGATIVE 08/20/2023 1604   Sepsis Labs: @LABRCNTIP (procalcitonin:4,lacticidven:4) )No results found for this or any previous visit (from the past 240 hours).   Radiological Exams on Admission: CT CHEST ABDOMEN PELVIS WO CONTRAST Result Date: 08/20/2023 CLINICAL DATA:  Blunt trauma. Renal cell carcinoma status post prior right nephrectomy. EXAM: CT CHEST, ABDOMEN AND PELVIS WITHOUT CONTRAST TECHNIQUE: Multidetector CT imaging of the chest, abdomen and pelvis was performed following the standard protocol without IV contrast. RADIATION DOSE REDUCTION: This exam was performed according to the departmental dose-optimization program which includes automated exposure control, adjustment of the mA and/or kV according to patient size and/or use of iterative reconstruction technique. COMPARISON:  CT dated 07/13/2023. FINDINGS: Evaluation of this exam is limited in the absence of intravenous contrast. CT CHEST FINDINGS Cardiovascular: There is no cardiomegaly or pericardial effusion. There is coronary vascular calcification. Mild atherosclerotic calcification of the thoracic aorta. No aneurysmal dilatation. The central pulmonary arteries are grossly unremarkable. Mediastinum/Nodes: No hilar or mediastinal adenopathy. The esophagus is grossly unremarkable. No mediastinal fluid collection. Lungs/Pleura: Interval decrease in the size of the subpleural nodule in the medial right lung base measuring approximately 1 cm in length on today's exam (previously 16 mm). No new nodule. Bibasilar atelectasis. No focal consolidation, pleural effusion, or pneumothorax. The central airways are patent. Musculoskeletal: Median sternotomy wires. Degenerative changes of the spine. Compression fractures of T5 and T11 as seen on the prior CT. No new fracture. CT ABDOMEN PELVIS FINDINGS No intra-abdominal free air or free fluid. Hepatobiliary: The liver is unremarkable. No biliary dilatation.  Cholecystectomy. No retained calcified stone noted in the central CBD. Pancreas: Unremarkable. No pancreatic ductal dilatation or surrounding inflammatory changes. Spleen: Normal in size without focal abnormality. Adrenals/Urinary Tract: The right adrenal glands unremarkable. Similar or slightly decreased nodule in the left adrenal gland. Status post prior right nephrectomy. No suspicious nodule noted in the nephrectomy bed. Small left renal upper pole cyst. No hydronephrosis or nephrolithiasis on the left. The left ureter and urinary bladder appear unremarkable. Stomach/Bowel: There is moderate stool throughout the colon. There is no bowel obstruction or active inflammation. Several small scattered colonic diverticula noted. The appendix is normal. Vascular/Lymphatic: Moderate aortoiliac atherosclerotic disease. There is a 3.4 cm infrarenal abdominal aortic aneurysm. The IVC is unremarkable. No portal venous gas. There is no adenopathy. Reproductive: The prostate and seminal vesicles  are grossly remarkable. Other: Small fat containing left spigelian hernia. Musculoskeletal: Degenerative changes.  No acute osseous pathology. IMPRESSION: 1. No acute/traumatic intrathoracic, abdominal, or pelvic pathology. 2. Interval decrease in the size of the subpleural nodule in the medial right lung base. 3. Status post prior right nephrectomy. No suspicious nodule noted in the nephrectomy bed. 4. Similar or slightly decreased nodule in the left adrenal gland. 5. A 3.4 cm infrarenal abdominal aortic aneurysm. Recommend follow-up ultrasound every 3 years. (Ref.: J Vasc Surg. 2018; 67:2-77 and J Am Coll Radiol 2013;10(10):789-794.) 6.  Aortic Atherosclerosis (ICD10-I70.0). Electronically Signed   By: Vanetta Chou M.D.   On: 08/20/2023 17:17   CT CERVICAL SPINE WO CONTRAST Result Date: 08/20/2023 CLINICAL DATA:  Polytrauma, blunt.  Fall. EXAM: CT CERVICAL SPINE WITHOUT CONTRAST TECHNIQUE: Multidetector CT imaging of the  cervical spine was performed without intravenous contrast. Multiplanar CT image reconstructions were also generated. RADIATION DOSE REDUCTION: This exam was performed according to the departmental dose-optimization program which includes automated exposure control, adjustment of the mA and/or kV according to patient size and/or use of iterative reconstruction technique. COMPARISON:  08/09/2020 FINDINGS: Alignment: Normal Skull base and vertebrae: No acute fracture. No primary bone lesion or focal pathologic process. Soft tissues and spinal canal: No prevertebral fluid or swelling. No visible canal hematoma. No acute findings Disc levels: Early disc space narrowing and spurring in the mid cervical spine. Mild bilateral degenerative facet disease. Upper chest: No acute findings Other: None IMPRESSION: No acute bony abnormality. Electronically Signed   By: Franky Crease M.D.   On: 08/20/2023 17:13   CT MAXILLOFACIAL WO CONTRAST Result Date: 08/20/2023 CLINICAL DATA:  Facial trauma, blunt EXAM: CT MAXILLOFACIAL WITHOUT CONTRAST TECHNIQUE: Multidetector CT imaging of the maxillofacial structures was performed. Multiplanar CT image reconstructions were also generated. RADIATION DOSE REDUCTION: This exam was performed according to the departmental dose-optimization program which includes automated exposure control, adjustment of the mA and/or kV according to patient size and/or use of iterative reconstruction technique. COMPARISON:  None Available. FINDINGS: Osseous: No fracture or mandibular dislocation. No destructive process. Orbits: Negative. No traumatic or inflammatory finding. Sinuses: Clear Soft tissues: Negative Limited intracranial: See head CT report IMPRESSION: No facial or orbital fracture. Electronically Signed   By: Franky Crease M.D.   On: 08/20/2023 17:11   CT HEAD WO CONTRAST Result Date: 08/20/2023 CLINICAL DATA:  Fall, hit head.  Headache. EXAM: CT HEAD WITHOUT CONTRAST TECHNIQUE: Contiguous axial  images were obtained from the base of the skull through the vertex without intravenous contrast. RADIATION DOSE REDUCTION: This exam was performed according to the departmental dose-optimization program which includes automated exposure control, adjustment of the mA and/or kV according to patient size and/or use of iterative reconstruction technique. COMPARISON:  None Available. FINDINGS: Brain: Low-density throughout the white matter compatible with chronic small vessel disease. No acute intracranial abnormality. Specifically, no hemorrhage, hydrocephalus, mass lesion, acute infarction, or significant intracranial injury. Vascular: No hyperdense vessel or unexpected calcification. Skull: No acute calvarial abnormality. Sinuses/Orbits: No acute findings Other: None IMPRESSION: Chronic small vessel disease throughout the deep white matter. No acute intracranial abnormality. Electronically Signed   By: Franky Crease M.D.   On: 08/20/2023 17:09   DG Pelvis Portable Result Date: 08/20/2023 CLINICAL DATA:  Pain after fall. EXAM: PORTABLE PELVIS 1-2 VIEWS COMPARISON:  None Available. FINDINGS: There is no evidence of acute pelvic fracture or diastasis. Evaluation of the sacrum is slightly limited by overlying bowel-gas. Femoral heads are seated within the  acetabula. Mild joint space narrowing of the bilateral hips. IMPRESSION: No acute osseous abnormality identified. Electronically Signed   By: Harrietta Sherry M.D.   On: 08/20/2023 16:03   DG Hand Complete Left Result Date: 08/20/2023 CLINICAL DATA:  Pain after fall. EXAM: LEFT HAND - COMPLETE 3+ VIEW COMPARISON:  None Available. FINDINGS: Cortical angulation with subtle lucency at the ulnar base of the fourth middle phalanx is equivocal for a nondisplaced intra-articular fracture. The remainder of the visualized bones are intact. Borderline widening of the scapholunate interval. Mild radiocarpal and first CMC joint space narrowing and spurring. Diffuse  interphalangeal joint space narrowing, most pronounced at the first interphalangeal joint. No radiopaque foreign body. IMPRESSION: 1. Cortical angulation with subtle lucency at the ulnar base of the fourth middle phalanx is equivocal for a nondisplaced intra-articular fracture. Consider further evaluation with CT. 2. Borderline widening of the scapholunate interval, suggestive of age-indeterminate scapholunate ligamentous injury. 3. Mild osteoarthritis. Electronically Signed   By: Harrietta Sherry M.D.   On: 08/20/2023 16:02   DG Chest Port 1 View Result Date: 08/20/2023 CLINICAL DATA:  Fall. EXAM: PORTABLE CHEST 1 VIEW COMPARISON:  07/16/2023. FINDINGS: The heart size and mediastinal contours are unchanged. Prior median sternotomy. No focal consolidation, pleural effusion, or pneumothorax. No acute osseous abnormality. IMPRESSION: No acute findings in the chest. Electronically Signed   By: Harrietta Sherry M.D.   On: 08/20/2023 15:54    EKG: Independently reviewed. Sinus rhythm, PAC, RBBB, LAFB.   Assessment/Plan   1. Acute hypoxic respiratory failure  - He presents after fall, denies SOB or cough, but is found to have new supplemental O2 requirement  - He is speaking in full sentences, has occasional wheeze  - Plan to start incentive spirometry, trial short-acting bronchodilators, continue supplemental O2 as-needed, may need home O2    2. 4th phalanx fracture, left hand  - Continue immobilization and supportive care, follow-up with orthopedic surgery   3. Metastatic RCC; CLL   - Follows with oncology, under observation for CLL, on pembrolizumab  and axitinib  for RCC under the care of Dr. Davonna    4. Bilateral LE dermatitis, wounds  - Does not appear acutely infected, continue wound care  5. Hx of DVT  - Eliquis     6. Anxiety  - Lexapro     7. Hypothyroidism  - Synthroid    8. General weakness, debility   - PT eval   9. AAA  - Note incidentally on CT  - Outpatient follow-up  recommended    DVT prophylaxis: Eliquis   Code Status: Full  Level of Care: Level of care: Med-Surg Family Communication: none present  Disposition Plan:  Patient is from: home  Anticipated d/c is to: TBD Anticipated d/c date is: 8/1 or 08/22/23  Patient currently: pending improved oxygenation or home O2, PT eval  Consults called: None  Admission status: Observation     Evalene GORMAN Sprinkles, MD [facility]  08/20/2023, 9:12 PM

## 2023-08-21 LAB — GLUCOSE, CAPILLARY
Glucose-Capillary: 121 mg/dL — ABNORMAL HIGH (ref 70–99)
Glucose-Capillary: 178 mg/dL — ABNORMAL HIGH (ref 70–99)
Glucose-Capillary: 141 mg/dL — ABNORMAL HIGH (ref 70–99)
Glucose-Capillary: 120 mg/dL — ABNORMAL HIGH (ref 70–99)

## 2023-08-21 LAB — CBC
HCT: 38.9 % — ABNORMAL LOW (ref 39.0–52.0)
Hemoglobin: 12.3 g/dL — ABNORMAL LOW (ref 13.0–17.0)
MCH: 34 pg (ref 26.0–34.0)
MCHC: 31.6 g/dL (ref 30.0–36.0)
MCV: 107.5 fL — ABNORMAL HIGH (ref 80.0–100.0)
Platelets: 214 K/uL (ref 150–400)
RBC: 3.62 MIL/uL — ABNORMAL LOW (ref 4.22–5.81)
RDW: 18 % — ABNORMAL HIGH (ref 11.5–15.5)
WBC: 24.7 K/uL — ABNORMAL HIGH (ref 4.0–10.5)
nRBC: 0 % (ref 0.0–0.2)

## 2023-08-21 LAB — BASIC METABOLIC PANEL WITH GFR
Anion gap: 11 (ref 5–15)
BUN: 15 mg/dL (ref 8–23)
CO2: 33 mmol/L — ABNORMAL HIGH (ref 22–32)
Calcium: 8.4 mg/dL — ABNORMAL LOW (ref 8.9–10.3)
Chloride: 96 mmol/L — ABNORMAL LOW (ref 98–111)
Creatinine, Ser: 1.21 mg/dL (ref 0.61–1.24)
GFR, Estimated: >60 mL/min (ref >60)
Glucose, Bld: 106 mg/dL — ABNORMAL HIGH (ref 70–99)
Potassium: 3.5 mmol/L (ref 3.5–5.1)
Sodium: 140 mmol/L (ref 135–145)

## 2023-08-21 NOTE — Progress Notes (Signed)
   08/21/23 1150  TOC Brief Assessment  Insurance and Status Reviewed  Patient has primary care physician Yes  Home environment has been reviewed From home c/wife, supportive family  Prior level of function: Minimal assist  Prior/Current Home Services No current home services  Social Drivers of Health Review SDOH reviewed interventions complete (smoking cessation added to AVS)  Readmission risk has been reviewed Yes  Transition of care needs no transition of care needs at this time   PT has recommended SNF, pt prefers to return home. Spoke c/pt's wife via phone and she also prefers for pt to return home. Wife states [facility] is assisting c/installation of railings at the home. Once home, if pt needs outpt PT wife states that his PCP will make a local referral. TOC following for possible home oxygen need.

## 2023-08-21 NOTE — Progress Notes (Signed)
 PROGRESS NOTE  George Anthony  FMW:[id number] DOB: [birth year] DOA: 08/20/2023 PCP: Severa Rock HERO, FNP  Consultants  Brief Narrative: 73 y.o. male with medical history significant for CLL under observation, metastatic RCC status post nephrectomy on chemotherapy, DVT on Eliquis , diet-controlled diabetes mellitus, hyperlipidemia, hypothyroidism, and lower extremity dermatitis with wounds who presents with headache after a fall at home.  Patient reports that he lost his balance and fell backwards, hitting the back of his head.  He denies losing consciousness.  He denies focal numbness or weakness.  He denies feeling short of breath or coughing.  He denies fever or chills.  He denies chest pain. EMS found the patient to be hypoxic, placed on 2 L/min of supplemental oxygen, and brought him into the ED.  Found to have 4th phalanx fx and TRH called for admission for hypoxia.     Assessment & Plan: Acute hypoxic respiratory failure  - He presents after fall, denies SOB or cough, but is found to have new supplemental O2 requirement  - This AM, continues to be on O2.  Speaking in full sentences.  Slight wheeze on exam - Continue incentive spirometry, trial short-acting bronchodilators, continue supplemental O2 as-needed, may need home O2     2. 4th phalanx fracture, left hand  - Continue immobilization and supportive care, follow-up with orthopedic surgery outpatient 1-2 weeks   3. Metastatic RCC; CLL   - Follows with oncology, under observation for CLL, on pembrolizumab  and axitinib  for RCC under the care of Dr. Davonna     4. Bilateral LE dermatitis, wounds  - Secondary to longstanding BL LE edema - Does not appear acutely infected, continue wound care   5. Hx of DVT  - continue home Eliquis      6. Anxiety  - Lexapro      7. Hypothyroidism  - Synthroid     8. General weakness, debility   - PT eval    9. AAA  - Note incidentally on CT  - Outpatient follow-up recommended     DVT  prophylaxis:   apixaban  (ELIQUIS ) tablet 5 mg  Code Status:   Code Status: Full Code Level of care: Med-Surg Status is: Observation  Subjective: Patient w/out complaints this AM.  Eating and drinking well.  No dyspnea at rest.  Hasn't been out of bed.   Objective: Vitals:   08/20/23 2043 08/20/23 2101 08/21/23 0532 08/21/23 0900  BP:  105/71 119/69 118/72  Pulse:  85 73 77  Resp:  20 20 18   Temp:  (!) 97.5 F (36.4 C) 98.6 F (37 C) 98.2 F (36.8 C)  TempSrc:  Oral Oral Oral  SpO2:  96% 98% 97%  Weight: 92.2 kg     Height: 5' 7 (1.702 m)       Intake/Output Summary (Last 24 hours) at 08/21/2023 1526 Last data filed at 08/21/2023 0900 Gross per 24 hour  Intake 240 ml  Output 1125 ml  Net -885 ml   Filed Weights   08/20/23 1308 08/20/23 2043  Weight: 93 kg 92.2 kg   Body mass index is 31.84 kg/m.  Gen: 73 y.o. male in no apparent distress.  Chronically ill-appearing, wearing Pill[place] Pulm: Non-labored breathing. Wheezing anterior chest  CV: Regular rate and rhythm.  GI: Abdomen soft, non-tender, non-distended, with normoactive bowel sounds. No organomegaly or masses felt. Ext: Warm, no deformities, BL LE edema +4 to thighs Neuro: Alert and oriented. No focal neurological deficits. Psych: Calm, affect appropriate.    I have personally  reviewed the following labs and images: CBC: Recent Labs  Lab 08/20/23 1315 08/21/23 0443  WBC 29.8* 24.7*  HGB 13.1 12.3*  HCT 40.8 38.9*  MCV 105.4* 107.5*  PLT 241 214   BMP &GFR Recent Labs  Lab 08/20/23 1315 08/21/23 0443  NA 137 140  K 3.8 3.5  CL 98 96*  CO2 29 33*  GLUCOSE 131* 106*  BUN 15 15  CREATININE 1.16 1.21  CALCIUM  8.6* 8.4*   Estimated Creatinine Clearance: 59.7 mL/min (by C-G formula based on SCr of 1.21 mg/dL). Liver & Pancreas: Recent Labs  Lab 08/20/23 1315  AST 18  ALT 14  ALKPHOS 86  BILITOT 0.7  PROT 5.9*  ALBUMIN 3.2*   No results for input(s): LIPASE, AMYLASE in the last 168  hours. No results for input(s): AMMONIA in the last 168 hours. Diabetic: No results for input(s): HGBA1C in the last 72 hours. Recent Labs  Lab 08/20/23 1346 08/21/23 0722 08/21/23 1105  GLUCAP 139* 121* 178*   Cardiac Enzymes: No results for input(s): CKTOTAL, CKMB, CKMBINDEX, TROPONINI in the last 168 hours. No results for input(s): PROBNP in the last 8760 hours. Coagulation Profile: Recent Labs  Lab 08/20/23 1315  INR 1.0   Thyroid  Function Tests: No results for input(s): TSH, T4TOTAL, FREET4, T3FREE, THYROIDAB in the last 72 hours. Lipid Profile: No results for input(s): CHOL, HDL, LDLCALC, TRIG, CHOLHDL, LDLDIRECT in the last 72 hours. Anemia Panel: No results for input(s): VITAMINB12, FOLATE, FERRITIN, TIBC, IRON , RETICCTPCT in the last 72 hours. Urine analysis:    Component Value Date/Time   COLORURINE YELLOW 08/20/2023 1604   APPEARANCEUR CLEAR 08/20/2023 1604   LABSPEC 1.019 08/20/2023 1604   PHURINE 6.0 08/20/2023 1604   GLUCOSEU NEGATIVE 08/20/2023 1604   HGBUR NEGATIVE 08/20/2023 1604   BILIRUBINUR NEGATIVE 08/20/2023 1604   KETONESUR NEGATIVE 08/20/2023 1604   PROTEINUR 100 (A) 08/20/2023 1604   UROBILINOGEN 2.0 (H) 11/19/2011 0158   NITRITE NEGATIVE 08/20/2023 1604   LEUKOCYTESUR NEGATIVE 08/20/2023 1604   Sepsis Labs: Invalid input(s): PROCALCITONIN, LACTICIDVEN  Microbiology: No results found for this or any previous visit (from the past 240 hours).  Radiology Studies: CT CHEST ABDOMEN PELVIS WO CONTRAST Result Date: 08/20/2023 CLINICAL DATA:  Blunt trauma. Renal cell carcinoma status post prior right nephrectomy. EXAM: CT CHEST, ABDOMEN AND PELVIS WITHOUT CONTRAST TECHNIQUE: Multidetector CT imaging of the chest, abdomen and pelvis was performed following the standard protocol without IV contrast. RADIATION DOSE REDUCTION: This exam was performed according to the departmental dose-optimization  program which includes automated exposure control, adjustment of the mA and/or kV according to patient size and/or use of iterative reconstruction technique. COMPARISON:  CT dated 07/13/2023. FINDINGS: Evaluation of this exam is limited in the absence of intravenous contrast. CT CHEST FINDINGS Cardiovascular: There is no cardiomegaly or pericardial effusion. There is coronary vascular calcification. Mild atherosclerotic calcification of the thoracic aorta. No aneurysmal dilatation. The central pulmonary arteries are grossly unremarkable. Mediastinum/Nodes: No hilar or mediastinal adenopathy. The esophagus is grossly unremarkable. No mediastinal fluid collection. Lungs/Pleura: Interval decrease in the size of the subpleural nodule in the medial right lung base measuring approximately 1 cm in length on today's exam (previously 16 mm). No new nodule. Bibasilar atelectasis. No focal consolidation, pleural effusion, or pneumothorax. The central airways are patent. Musculoskeletal: Median sternotomy wires. Degenerative changes of the spine. Compression fractures of T5 and T11 as seen on the prior CT. No new fracture. CT ABDOMEN PELVIS FINDINGS No intra-abdominal free air or  free fluid. Hepatobiliary: The liver is unremarkable. No biliary dilatation. Cholecystectomy. No retained calcified stone noted in the central CBD. Pancreas: Unremarkable. No pancreatic ductal dilatation or surrounding inflammatory changes. Spleen: Normal in size without focal abnormality. Adrenals/Urinary Tract: The right adrenal glands unremarkable. Similar or slightly decreased nodule in the left adrenal gland. Status post prior right nephrectomy. No suspicious nodule noted in the nephrectomy bed. Small left renal upper pole cyst. No hydronephrosis or nephrolithiasis on the left. The left ureter and urinary bladder appear unremarkable. Stomach/Bowel: There is moderate stool throughout the colon. There is no bowel obstruction or active inflammation.  Several small scattered colonic diverticula noted. The appendix is normal. Vascular/Lymphatic: Moderate aortoiliac atherosclerotic disease. There is a 3.4 cm infrarenal abdominal aortic aneurysm. The IVC is unremarkable. No portal venous gas. There is no adenopathy. Reproductive: The prostate and seminal vesicles are grossly remarkable. Other: Small fat containing left spigelian hernia. Musculoskeletal: Degenerative changes.  No acute osseous pathology. IMPRESSION: 1. No acute/traumatic intrathoracic, abdominal, or pelvic pathology. 2. Interval decrease in the size of the subpleural nodule in the medial right lung base. 3. Status post prior right nephrectomy. No suspicious nodule noted in the nephrectomy bed. 4. Similar or slightly decreased nodule in the left adrenal gland. 5. A 3.4 cm infrarenal abdominal aortic aneurysm. Recommend follow-up ultrasound every 3 years. (Ref.: J Vasc Surg. 2018; 67:2-77 and J Am Coll Radiol 2013;10(10):789-794.) 6.  Aortic Atherosclerosis (ICD10-I70.0). Electronically Signed   By: Vanetta Chou M.D.   On: 08/20/2023 17:17   CT CERVICAL SPINE WO CONTRAST Result Date: 08/20/2023 CLINICAL DATA:  Polytrauma, blunt.  Fall. EXAM: CT CERVICAL SPINE WITHOUT CONTRAST TECHNIQUE: Multidetector CT imaging of the cervical spine was performed without intravenous contrast. Multiplanar CT image reconstructions were also generated. RADIATION DOSE REDUCTION: This exam was performed according to the departmental dose-optimization program which includes automated exposure control, adjustment of the mA and/or kV according to patient size and/or use of iterative reconstruction technique. COMPARISON:  08/09/2020 FINDINGS: Alignment: Normal Skull base and vertebrae: No acute fracture. No primary bone lesion or focal pathologic process. Soft tissues and spinal canal: No prevertebral fluid or swelling. No visible canal hematoma. No acute findings Disc levels: Early disc space narrowing and spurring in  the mid cervical spine. Mild bilateral degenerative facet disease. Upper chest: No acute findings Other: None IMPRESSION: No acute bony abnormality. Electronically Signed   By: Franky Crease M.D.   On: 08/20/2023 17:13   CT MAXILLOFACIAL WO CONTRAST Result Date: 08/20/2023 CLINICAL DATA:  Facial trauma, blunt EXAM: CT MAXILLOFACIAL WITHOUT CONTRAST TECHNIQUE: Multidetector CT imaging of the maxillofacial structures was performed. Multiplanar CT image reconstructions were also generated. RADIATION DOSE REDUCTION: This exam was performed according to the departmental dose-optimization program which includes automated exposure control, adjustment of the mA and/or kV according to patient size and/or use of iterative reconstruction technique. COMPARISON:  None Available. FINDINGS: Osseous: No fracture or mandibular dislocation. No destructive process. Orbits: Negative. No traumatic or inflammatory finding. Sinuses: Clear Soft tissues: Negative Limited intracranial: See head CT report IMPRESSION: No facial or orbital fracture. Electronically Signed   By: Franky Crease M.D.   On: 08/20/2023 17:11   CT HEAD WO CONTRAST Result Date: 08/20/2023 CLINICAL DATA:  Fall, hit head.  Headache. EXAM: CT HEAD WITHOUT CONTRAST TECHNIQUE: Contiguous axial images were obtained from the base of the skull through the vertex without intravenous contrast. RADIATION DOSE REDUCTION: This exam was performed according to the departmental dose-optimization program which includes automated exposure  control, adjustment of the mA and/or kV according to patient size and/or use of iterative reconstruction technique. COMPARISON:  None Available. FINDINGS: Brain: Low-density throughout the white matter compatible with chronic small vessel disease. No acute intracranial abnormality. Specifically, no hemorrhage, hydrocephalus, mass lesion, acute infarction, or significant intracranial injury. Vascular: No hyperdense vessel or unexpected calcification.  Skull: No acute calvarial abnormality. Sinuses/Orbits: No acute findings Other: None IMPRESSION: Chronic small vessel disease throughout the deep white matter. No acute intracranial abnormality. Electronically Signed   By: Franky Crease M.D.   On: 08/20/2023 17:09   DG Pelvis Portable Result Date: 08/20/2023 CLINICAL DATA:  Pain after fall. EXAM: PORTABLE PELVIS 1-2 VIEWS COMPARISON:  None Available. FINDINGS: There is no evidence of acute pelvic fracture or diastasis. Evaluation of the sacrum is slightly limited by overlying bowel-gas. Femoral heads are seated within the acetabula. Mild joint space narrowing of the bilateral hips. IMPRESSION: No acute osseous abnormality identified. Electronically Signed   By: Harrietta Sherry M.D.   On: 08/20/2023 16:03   DG Hand Complete Left Result Date: 08/20/2023 CLINICAL DATA:  Pain after fall. EXAM: LEFT HAND - COMPLETE 3+ VIEW COMPARISON:  None Available. FINDINGS: Cortical angulation with subtle lucency at the ulnar base of the fourth middle phalanx is equivocal for a nondisplaced intra-articular fracture. The remainder of the visualized bones are intact. Borderline widening of the scapholunate interval. Mild radiocarpal and first CMC joint space narrowing and spurring. Diffuse interphalangeal joint space narrowing, most pronounced at the first interphalangeal joint. No radiopaque foreign body. IMPRESSION: 1. Cortical angulation with subtle lucency at the ulnar base of the fourth middle phalanx is equivocal for a nondisplaced intra-articular fracture. Consider further evaluation with CT. 2. Borderline widening of the scapholunate interval, suggestive of age-indeterminate scapholunate ligamentous injury. 3. Mild osteoarthritis. Electronically Signed   By: Harrietta Sherry M.D.   On: 08/20/2023 16:02   DG Chest Port 1 View Result Date: 08/20/2023 CLINICAL DATA:  Fall. EXAM: PORTABLE CHEST 1 VIEW COMPARISON:  07/16/2023. FINDINGS: The heart size and mediastinal  contours are unchanged. Prior median sternotomy. No focal consolidation, pleural effusion, or pneumothorax. No acute osseous abnormality. IMPRESSION: No acute findings in the chest. Electronically Signed   By: Harrietta Sherry M.D.   On: 08/20/2023 15:54    Scheduled Meds:  apixaban   5 mg Oral BID   atorvastatin   40 mg Oral QHS   escitalopram   10 mg Oral Daily   furosemide   40 mg Oral Daily   insulin  aspart  0-6 Units Subcutaneous TID WC   levothyroxine   100 mcg Oral QAC breakfast   nystatin   1 Application Topical BID   silver  sulfADIAZINE   1 Application Topical Daily   sodium chloride  flush  3 mL Intravenous Q12H   Continuous Infusions:  sodium chloride        LOS: 0 days   35 minutes with more than 50% spent in reviewing records, counseling patient/family and coordinating care.  Reyes VEAR Gaw, MD [facility] [email or web address] 08/21/2023, 3:26 PM

## 2023-08-21 NOTE — Evaluation (Signed)
 Physical Therapy Evaluation Patient Details Name: George Anthony MRN: [id number] DOB: Nov 22, 1950 Today's Date: 08/21/2023  History of Present Illness  George Anthony is a 73 y.o. male with medical history significant for CLL under observation, metastatic RCC status post nephrectomy on chemotherapy, DVT on Eliquis , diet-controlled diabetes mellitus, hyperlipidemia, hypothyroidism, and lower extremity dermatitis with wounds who presents with headache after a fall at home.       Patient reports that he lost his balance and fell backwards, hitting the back of his head.  He denies losing consciousness.  He denies focal numbness or weakness.  He denies feeling short of breath or coughing.  He denies fever or chills.  He denies chest pain.     EMS found the patient to be hypoxic, placed on 2 L/min of supplemental oxygen, and brought him into the ED.   Clinical Impression  Patient demonstrates slow labored movement for sitting up at bedside and transferring to recliner requiring min/modA for successful completion. He was able to ambulate 45 ft in the hallway with SPC at a decreased gait speed but fatigues easily. Patient presents with general LE weakness and poor standing balance.  Patient received on 2L, trialed on room air during session, but patient SpO2 was 86% after rest, 2 LPM supplemental oxygen was reapplied. Patient left in recliner with call bell in reach. Patient will benefit from continued skilled physical therapy in hospital and recommended venue below to increase strength, balance, endurance for safe ADLs and gait.         If plan is discharge home, recommend the following: A little help with walking and/or transfers;A little help with bathing/dressing/bathroom;Help with stairs or ramp for entrance;Assist for transportation;Assistance with cooking/housework   Can travel by private vehicle        Equipment Recommendations None recommended by PT  Recommendations for Other Services        Functional Status Assessment Patient has had a recent decline in their functional status and demonstrates the ability to make significant improvements in function in a reasonable and predictable amount of time.     Precautions / Restrictions Precautions Precautions: Fall Recall of Precautions/Restrictions: Intact Restrictions Weight Bearing Restrictions Per Provider Order: No      Mobility  Bed Mobility Overal bed mobility: Needs Assistance Bed Mobility: Supine to Sit     Supine to sit: Min assist, Mod assist, HOB elevated     General bed mobility comments: slow labored movement, difficulty gettig to sit due to Rt hand injury (wrapped)    Transfers Overall transfer level: Needs assistance Equipment used: Straight cane Transfers: Sit to/from Stand, Bed to chair/wheelchair/BSC Sit to Stand: Min assist   Step pivot transfers: Min assist       General transfer comment: use of right UE to push up from bed; very unsteady without AD, slightly improved with RW    Ambulation/Gait Ambulation/Gait assistance: Contact guard assist Gait Distance (Feet): 45 Feet Assistive device: Straight cane Gait Pattern/deviations: Step-to pattern, Decreased step length - right, Decreased step length - left, Decreased stride length, Trunk flexed Gait velocity: slow     General Gait Details: slightly unsteady using SPC, kyphotic posturing increases forward lean when walking  Stairs            Wheelchair Mobility     Tilt Bed    Modified Rankin (Stroke Patients Only)       Balance Overall balance assessment: Needs assistance Sitting-balance support: No upper extremity supported, Feet supported Sitting balance-Leahy Scale:  Fair Sitting balance - Comments: fair/good seated EOB   Standing balance support: Single extremity supported Standing balance-Leahy Scale: Poor Standing balance comment: poor without AD; poor/fair with SPC                              Pertinent Vitals/Pain Pain Assessment Pain Assessment: Faces Faces Pain Scale: Hurts little more Pain Location: back of head, right elbow Pain Descriptors / Indicators: Headache, Sore, Discomfort Pain Intervention(s): Limited activity within patient's tolerance, Monitored during session, Repositioned    Home Living Family/patient expects to be discharged to:: Private residence Living Arrangements: Spouse/significant other Available Help at Discharge: Family;Available PRN/intermittently Type of Home: House Home Access: Stairs to enter Entrance Stairs-Rails: Right Entrance Stairs-Number of Steps: 12   Home Layout: One level Home Equipment: None      Prior Function Prior Level of Function : Independent/Modified Independent             Mobility Comments: Reports being community ambulator without AD ADLs Comments: Reports being independent with ADLs     Extremity/Trunk Assessment   Upper Extremity Assessment Upper Extremity Assessment: LUE deficits/detail LUE Deficits / Details: Left hand fracture LUE: Unable to fully assess due to immobilization    Lower Extremity Assessment Lower Extremity Assessment: Generalized weakness    Cervical / Trunk Assessment Cervical / Trunk Assessment: Kyphotic  Communication   Communication Communication: No apparent difficulties    Cognition Arousal: Alert Behavior During Therapy: WFL for tasks assessed/performed, Agitated   PT - Cognitive impairments: Memory                       PT - Cognition Comments: Reports not remembering much leading up to the fall, still having memory difficulty; frequent verbal cues not to push up with left hand Following commands: Intact       Cueing Cueing Techniques: Verbal cues, Tactile cues     General Comments      Exercises     Assessment/Plan    PT Assessment Patient needs continued PT services  PT Problem List Decreased strength;Decreased balance;Decreased  mobility;Decreased activity tolerance       PT Treatment Interventions DME instruction;Therapeutic activities;Gait training;Therapeutic exercise;Stair training;Balance training;Functional mobility training;Patient/family education    PT Goals (Current goals can be found in the Care Plan section)  Acute Rehab PT Goals Patient Stated Goal: return home PT Goal Formulation: With patient Time For Goal Achievement: 09/04/23 Potential to Achieve Goals: Good    Frequency Min 3X/week     Co-evaluation               AM-PAC PT 6 Clicks Mobility  Outcome Measure Help needed turning from your back to your side while in a flat bed without using bedrails?: A Little Help needed moving from lying on your back to sitting on the side of a flat bed without using bedrails?: A Little Help needed moving to and from a bed to a chair (including a wheelchair)?: A Lot Help needed standing up from a chair using your arms (e.g., wheelchair or bedside chair)?: A Lot Help needed to walk in hospital room?: A Little Help needed climbing 3-5 steps with a railing? : A Lot 6 Click Score: 15    End of Session Equipment Utilized During Treatment: Gait belt;Oxygen Activity Tolerance: Patient limited by fatigue;Patient limited by pain Patient left: in chair;with call bell/phone within reach Nurse Communication: Mobility status PT Visit Diagnosis: Unsteadiness  on feet (R26.81);History of falling (Z91.81);Other abnormalities of gait and mobility (R26.89);Muscle weakness (generalized) (M62.81)    Time: 9089-9052 PT Time Calculation (min) (ACUTE ONLY): 37 min   Charges:   PT Evaluation $PT Eval Moderate Complexity: 1 Mod PT Treatments $Therapeutic Activity: 23-37 mins PT General Charges $$ ACUTE PT VISIT: 1 Visit        2:06 PM, 08/21/23,  Dekendrick Uzelac, SPT

## 2023-08-21 NOTE — Plan of Care (Signed)
  Problem: Acute Rehab PT Goals(only PT should resolve) Goal: Pt Will Go Supine/Side To Sit Outcome: Progressing Flowsheets (Taken 08/21/2023 1407) Pt will go Supine/Side to Sit:  with supervision  with contact guard assist Goal: Patient Will Transfer Sit To/From Stand Outcome: Progressing Flowsheets (Taken 08/21/2023 1407) Patient will transfer sit to/from stand:  with contact guard assist  with supervision Goal: Pt Will Transfer Bed To Chair/Chair To Bed Outcome: Progressing Flowsheets (Taken 08/21/2023 1407) Pt will Transfer Bed to Chair/Chair to Bed:  with contact guard assist  with supervision Goal: Pt Will Ambulate Outcome: Progressing Flowsheets (Taken 08/21/2023 1407) Pt will Ambulate:  75 feet  with supervision  with cane

## 2023-08-21 NOTE — Care Management Obs Status (Signed)
 MEDICARE OBSERVATION STATUS NOTIFICATION   Patient Details  Name: George Anthony MRN: [id number] Date of Birth: [birth year]   Medicare Observation Status Notification Given:  Yes    Nena LITTIE Coffee, RN 08/21/2023, 12:23 PM

## 2023-08-22 DIAGNOSIS — J9601 Acute respiratory failure with hypoxia: Secondary | ICD-10-CM | POA: Diagnosis not present

## 2023-08-22 LAB — CBC
HCT: 40.3 % (ref 39.0–52.0)
Hemoglobin: 12.8 g/dL — ABNORMAL LOW (ref 13.0–17.0)
MCH: 33.8 pg (ref 26.0–34.0)
MCHC: 31.8 g/dL (ref 30.0–36.0)
MCV: 106.3 fL — ABNORMAL HIGH (ref 80.0–100.0)
Platelets: 204 K/uL (ref 150–400)
RBC: 3.79 MIL/uL — ABNORMAL LOW (ref 4.22–5.81)
RDW: 17.5 % — ABNORMAL HIGH (ref 11.5–15.5)
WBC: 23.1 K/uL — ABNORMAL HIGH (ref 4.0–10.5)
nRBC: 0 % (ref 0.0–0.2)

## 2023-08-22 LAB — BASIC METABOLIC PANEL WITH GFR
Anion gap: 13 (ref 5–15)
BUN: 14 mg/dL (ref 8–23)
CO2: 33 mmol/L — ABNORMAL HIGH (ref 22–32)
Calcium: 8.5 mg/dL — ABNORMAL LOW (ref 8.9–10.3)
Chloride: 93 mmol/L — ABNORMAL LOW (ref 98–111)
Creatinine, Ser: 1.08 mg/dL (ref 0.61–1.24)
GFR, Estimated: >60 mL/min (ref >60)
Glucose, Bld: 121 mg/dL — ABNORMAL HIGH (ref 70–99)
Potassium: 3.5 mmol/L (ref 3.5–5.1)
Sodium: 139 mmol/L (ref 135–145)

## 2023-08-22 LAB — GLUCOSE, CAPILLARY
Glucose-Capillary: 125 mg/dL — ABNORMAL HIGH (ref 70–99)
Glucose-Capillary: 146 mg/dL — ABNORMAL HIGH (ref 70–99)
Glucose-Capillary: 147 mg/dL — ABNORMAL HIGH (ref 70–99)
Glucose-Capillary: 125 mg/dL — ABNORMAL HIGH (ref 70–99)

## 2023-08-22 NOTE — Plan of Care (Signed)

## 2023-08-22 NOTE — Progress Notes (Signed)
 PROGRESS NOTE  Elijio Staples  FMW:[id number] DOB: 1950-08-13 DOA: 08/20/2023 PCP: Severa Rock HERO, FNP  Consultants  Brief Narrative: 73 y.o. male with medical history significant for CLL under observation, metastatic RCC status post nephrectomy on chemotherapy, DVT on Eliquis , diet-controlled diabetes mellitus, hyperlipidemia, hypothyroidism, and lower extremity dermatitis with wounds who presents with headache after a fall at home.  Patient reports that he lost his balance and fell backwards, hitting the back of his head.  He denies losing consciousness.  He denies focal numbness or weakness.  He denies feeling short of breath or coughing.  He denies fever or chills.  He denies chest pain. EMS found the patient to be hypoxic, placed on 2 L/min of supplemental oxygen, and brought him into the ED.  Found to have 4th phalanx fx and TRH called for admission for hypoxia.     Assessment & Plan: Acute hypoxic respiratory failure  - He presents after fall, denies SOB or cough, but is found to have new supplemental O2 requirement  - Remains on O2.  Speaking in full sentences.  Slight wheeze on exam - Continue incentive spirometry, trial short-acting bronchodilators, continue supplemental O2 as-needed, will most likely need home O2 at discharge, discharge will be to SNF see below.   2. 4th phalanx fracture, left hand  - Continue immobilization and supportive care, follow-up with orthopedic surgery outpatient 1-2 weeks   3. Metastatic RCC; CLL   - Follows with oncology, under observation for CLL, on pembrolizumab  and axitinib  for RCC under the care of Dr. Davonna     4. Bilateral LE dermatitis, wounds  - Secondary to longstanding BL LE edema - Does not appear acutely infected, continue wound care   5. Hx of DVT  - continue home Eliquis      6. Anxiety  - Lexapro      7. Hypothyroidism  - Synthroid     8. General weakness, debility   - PT eval    9. AAA  - Note incidentally on CT  -  Outpatient follow-up recommended     DVT prophylaxis:   apixaban  (ELIQUIS ) tablet 5 mg  Code Status:   Code Status: Full Code Level of care: Med-Surg Status is: Observation  Subjective: Patient w/out complaints this AM.  Eating and drinking well.  No dyspnea at rest.    Objective: Vitals:   08/21/23 2110 08/21/23 2335 08/22/23 0337 08/22/23 1426  BP: (!) 96/55 111/67 107/64 115/70  Pulse: 78 74 71 77  Resp: 20 18 18 16   Temp: 98.8 F (37.1 C) 98.3 F (36.8 C) 98.6 F (37 C) 98.3 F (36.8 C)  TempSrc: Oral Oral Oral Oral  SpO2: 92% 95% 92% 96%  Weight:      Height:        Intake/Output Summary (Last 24 hours) at 08/22/2023 1513 Last data filed at 08/22/2023 1300 Gross per 24 hour  Intake 480 ml  Output 1500 ml  Net -1020 ml   Filed Weights   08/20/23 1308 08/20/23 2043  Weight: 93 kg 92.2 kg   Body mass index is 31.84 kg/m.  Gen: 74 y.o. male in no apparent distress.  Chronically ill-appearing, wearing [place] Pulm: Non-labored breathing. Wheezing anterior chest  CV: Regular rate and rhythm.  GI: Abdomen soft, non-tender, non-distended, with normoactive bowel sounds. No organomegaly or masses felt. Ext: Warm, no deformities, BL LE edema +4 to thighs Neuro: Alert and oriented. No focal neurological deficits. Psych: Calm, affect appropriate.    I have personally reviewed  the following labs and images: CBC: Recent Labs  Lab 08/20/23 1315 08/21/23 0443 08/22/23 0459  WBC 29.8* 24.7* 23.1*  HGB 13.1 12.3* 12.8*  HCT 40.8 38.9* 40.3  MCV 105.4* 107.5* 106.3*  PLT 241 214 204   BMP &GFR Recent Labs  Lab 08/20/23 1315 08/21/23 0443 08/22/23 0459  NA 137 140 139  K 3.8 3.5 3.5  CL 98 96* 93*  CO2 29 33* 33*  GLUCOSE 131* 106* 121*  BUN 15 15 14   CREATININE 1.16 1.21 1.08  CALCIUM  8.6* 8.4* 8.5*   Estimated Creatinine Clearance: 66.9 mL/min (by C-G formula based on SCr of 1.08 mg/dL). Liver & Pancreas: Recent Labs  Lab 08/20/23 1315  AST 18  ALT 14   ALKPHOS 86  BILITOT 0.7  PROT 5.9*  ALBUMIN 3.2*   No results for input(s): LIPASE, AMYLASE in the last 168 hours. No results for input(s): AMMONIA in the last 168 hours. Diabetic: No results for input(s): HGBA1C in the last 72 hours. Recent Labs  Lab 08/21/23 1105 08/21/23 1635 08/21/23 2111 08/22/23 0714 08/22/23 1116  GLUCAP 178* 141* 120* 125* 146*   Cardiac Enzymes: No results for input(s): CKTOTAL, CKMB, CKMBINDEX, TROPONINI in the last 168 hours. No results for input(s): PROBNP in the last 8760 hours. Coagulation Profile: Recent Labs  Lab 08/20/23 1315  INR 1.0   Thyroid  Function Tests: No results for input(s): TSH, T4TOTAL, FREET4, T3FREE, THYROIDAB in the last 72 hours. Lipid Profile: No results for input(s): CHOL, HDL, LDLCALC, TRIG, CHOLHDL, LDLDIRECT in the last 72 hours. Anemia Panel: No results for input(s): VITAMINB12, FOLATE, FERRITIN, TIBC, IRON , RETICCTPCT in the last 72 hours. Urine analysis:    Component Value Date/Time   COLORURINE YELLOW 08/20/2023 1604   APPEARANCEUR CLEAR 08/20/2023 1604   LABSPEC 1.019 08/20/2023 1604   PHURINE 6.0 08/20/2023 1604   GLUCOSEU NEGATIVE 08/20/2023 1604   HGBUR NEGATIVE 08/20/2023 1604   BILIRUBINUR NEGATIVE 08/20/2023 1604   KETONESUR NEGATIVE 08/20/2023 1604   PROTEINUR 100 (A) 08/20/2023 1604   UROBILINOGEN 2.0 (H) 11/19/2011 0158   NITRITE NEGATIVE 08/20/2023 1604   LEUKOCYTESUR NEGATIVE 08/20/2023 1604   Sepsis Labs: Invalid input(s): PROCALCITONIN, LACTICIDVEN  Microbiology: No results found for this or any previous visit (from the past 240 hours).  Radiology Studies: No results found.   Scheduled Meds:  apixaban   5 mg Oral BID   atorvastatin   40 mg Oral QHS   escitalopram   10 mg Oral Daily   furosemide   40 mg Oral Daily   insulin  aspart  0-6 Units Subcutaneous TID WC   levothyroxine   100 mcg Oral QAC breakfast   nystatin   1  Application Topical BID   silver  sulfADIAZINE   1 Application Topical Daily   sodium chloride  flush  3 mL Intravenous Q12H   Continuous Infusions:     LOS: 0 days   35 minutes with more than 50% spent in reviewing records, counseling patient/family and coordinating care.  Reyes VEAR Gaw, MD [facility] [email or web address] 08/22/2023, 3:13 PM

## 2023-08-22 NOTE — Progress Notes (Signed)
 Dressing on bilateral lower extremities performed.

## 2023-08-22 NOTE — Plan of Care (Signed)
   Problem: Education: Goal: Knowledge of General Education information will improve Description: Including pain rating scale, medication(s)/side effects and non-pharmacologic comfort measures Outcome: Progressing   Problem: Clinical Measurements: Goal: Will remain free from infection Outcome: Progressing   Problem: Coping: Goal: Level of anxiety will decrease Outcome: Progressing

## 2023-08-23 ENCOUNTER — Other Ambulatory Visit: Payer: Self-pay | Admitting: Oncology

## 2023-08-23 DIAGNOSIS — J9601 Acute respiratory failure with hypoxia: Secondary | ICD-10-CM | POA: Diagnosis not present

## 2023-08-23 LAB — CBC
HCT: 41.2 % (ref 39.0–52.0)
Hemoglobin: 13.1 g/dL (ref 13.0–17.0)
MCH: 33.8 pg (ref 26.0–34.0)
MCHC: 31.8 g/dL (ref 30.0–36.0)
MCV: 106.2 fL — ABNORMAL HIGH (ref 80.0–100.0)
Platelets: 226 K/uL (ref 150–400)
RBC: 3.88 MIL/uL — ABNORMAL LOW (ref 4.22–5.81)
RDW: 16.9 % — ABNORMAL HIGH (ref 11.5–15.5)
WBC: 27 K/uL — ABNORMAL HIGH (ref 4.0–10.5)
nRBC: 0 % (ref 0.0–0.2)

## 2023-08-23 LAB — BASIC METABOLIC PANEL WITH GFR
Anion gap: 12 (ref 5–15)
BUN: 13 mg/dL (ref 8–23)
CO2: 33 mmol/L — ABNORMAL HIGH (ref 22–32)
Calcium: 8.6 mg/dL — ABNORMAL LOW (ref 8.9–10.3)
Chloride: 91 mmol/L — ABNORMAL LOW (ref 98–111)
Creatinine, Ser: 0.99 mg/dL (ref 0.61–1.24)
GFR, Estimated: >60 mL/min (ref >60)
Glucose, Bld: 127 mg/dL — ABNORMAL HIGH (ref 70–99)
Potassium: 3.4 mmol/L — ABNORMAL LOW (ref 3.5–5.1)
Sodium: 136 mmol/L (ref 135–145)

## 2023-08-23 LAB — GLUCOSE, CAPILLARY
Glucose-Capillary: 132 mg/dL — ABNORMAL HIGH (ref 70–99)
Glucose-Capillary: 124 mg/dL — ABNORMAL HIGH (ref 70–99)
Glucose-Capillary: 133 mg/dL — ABNORMAL HIGH (ref 70–99)

## 2023-08-23 MED ORDER — POTASSIUM CHLORIDE CRYS ER 20 MEQ PO TBCR
40.0000 meq | EXTENDED_RELEASE_TABLET | Freq: Two times a day (BID) | ORAL | Status: DC
  Administered 2023-08-23: 40 meq via ORAL
  Filled 2023-08-23: qty 2

## 2023-08-23 MED ORDER — OXYCODONE HCL 5 MG PO TABS: 5.0000 mg | ORAL_TABLET | ORAL | 0 refills | Status: DC | PRN

## 2023-08-23 NOTE — Progress Notes (Signed)
 Per oxygen sats provided by Clotilda Daring, RN: SATURATION QUALIFICATIONS: (This note is used to comply with regulatory documentation for home oxygen)   Patient Saturations on Room Air at Rest = 85%   Patient Saturations on Room Air while Ambulating = n/a%   Patient Saturations on 2 Liters of oxygen while Ambulating = 92%   Please briefly explain why patient needs home oxygen: Low oxygen saturation on RA while sitting so I did not ambulate patient on RA

## 2023-08-23 NOTE — Progress Notes (Signed)
 SATURATION QUALIFICATIONS: (This note is used to comply with regulatory documentation for home oxygen)  Patient Saturations on Room Air at Rest = 85%  Patient Saturations on Room Air while Ambulating = n/a%  Patient Saturations on 2 Liters of oxygen while Ambulating = 92%  Please briefly explain why patient needs home oxygen: Low oxygen saturation on RA while sitting so I did not ambulate patient on RA

## 2023-08-23 NOTE — TOC Transition Note (Signed)
 Transition of Care [facility]) - Discharge Note   Patient Details  Name: George Anthony MRN: [id number] Date of Birth: 01-25-1950  [facility] CM/SW Contact:  Nena LITTIE Coffee, RN Phone Number: 08/23/2023, 11:55 AM   Clinical Narrative:    Pt to discharge home c/wife who is a CNA and states that she is comfortable taking pt home and providing care. [employer] accepted referral to provide oxygen.   Final next level of care: Home/Self Care Barriers to Discharge: Barriers Resolved   Patient Goals and CMS Choice            Discharge Placement                       Discharge Plan and Services Additional resources added to the After Visit Summary for                  DME Arranged: Oxygen DME Agency: [facility] Date DME Agency Contacted: 08/23/23   Representative spoke with at DME Agency: Dorothe            Social Drivers of Health (SDOH) Interventions SDOH Screenings   Food Insecurity: No Food Insecurity (08/20/2023)  Housing: Low Risk  (08/20/2023)  Transportation Needs: No Transportation Needs (08/20/2023)  Utilities: Not At Risk (08/20/2023)  Alcohol Screen: Low Risk  (04/09/2022)  Depression (PHQ2-9): Low Risk  (08/14/2023)  Financial Resource Strain: Low Risk  (04/09/2022)  Physical Activity: Inactive (04/09/2022)  Social Connections: Moderately Isolated (08/20/2023)  Stress: No Stress Concern Present (04/09/2022)  Tobacco Use: High Risk (08/20/2023)     Readmission Risk Interventions    07/16/2023   10:02 AM  Readmission Risk Prevention Plan  Transportation Screening Complete  Home Care Screening Complete  Medication Review (RN CM) Complete

## 2023-08-23 NOTE — Plan of Care (Signed)
   Problem: Education: Goal: Knowledge of General Education information will improve Description Including pain rating scale, medication(s)/side effects and non-pharmacologic comfort measures Outcome: Progressing   Problem: Health Behavior/Discharge Planning: Goal: Ability to manage health-related needs will improve Outcome: Progressing

## 2023-08-23 NOTE — Discharge Summary (Signed)
 Physician Discharge Summary  George Anthony FMW:[id number] DOB: 07-08-1950 DOA: 08/20/2023  PCP: Severa Rock HERO, FNP  Admit date: 08/20/2023  Discharge date: 08/23/2023  Admitted From:Home  Disposition:  Home  Recommendations for Outpatient Follow-up:  Follow up with PCP in 1-2 weeks Follow-up with orthopedics in 1 week for left hand phalanx fracture with referral sent Oxycodone  as needed for pain control Continue other home medications as prior Follow-up AAA noted incidentally on CT scan with ultrasound every 3 years  Home Health: Refuses and refuses SNF  Equipment/Devices: 2 L nasal cannula oxygen  Discharge Condition:Stable  CODE STATUS: Full  Diet recommendation: Heart Healthy/carb modified  Brief/Interim Summary:  73 y.o. male with medical history significant for CLL under observation, metastatic RCC status post nephrectomy on chemotherapy, DVT on Eliquis , diet-controlled diabetes mellitus, hyperlipidemia, hypothyroidism, and lower extremity dermatitis with wounds who presents with headache after a fall at home.  Patient reports that he lost his balance and fell backwards, hitting the back of his head.  He denies losing consciousness.  He denies focal numbness or weakness.  He denies feeling short of breath or coughing.  He denies fever or chills.  He denies chest pain. EMS found the patient to be hypoxic, placed on 2 L/min of supplemental oxygen, and brought him into the ED.  Found to have 4th phalanx fx and TRH called for admission for hypoxia.  Patient has no significant findings on imaging studies and continues to have some hypoxemia for which she will require some home oxygen.  He has otherwise been seen by PT with recommendations for SNF, but refuses and does not want any home health services.  His wife at home will be able to care for him and therefore he will be discharged at this time with home oxygen.  Plans are to follow-up outpatient with orthopedics regarding his left  hand fracture.  Discharge Diagnoses:  Principal Problem:   Acute respiratory failure with hypoxia (HCC) Active Problems:   Type 2 diabetes mellitus with hypoglycemia, with long-term current use of insulin  (HCC)   Generalized anxiety disorder   Personal history of DVT (deep vein thrombosis)   Renal cell carcinoma of right kidney (HCC)   CLL (chronic lymphocytic leukemia) (HCC)   Hypothyroidism   Leg wound, left   Infrarenal abdominal aortic aneurysm (AAA) without rupture (HCC)   Leg wound, right  Principal discharge diagnosis: Mechanical fall at home with left fourth phalanx fracture along with acute hypoxemic respiratory failure.  Discharge Instructions  Discharge Instructions     Ambulatory referral to Orthopedic Surgery   Complete by: As directed    Diet - low sodium heart healthy   Complete by: As directed    If the dressing is still on your incision site when you go home, remove it on the third day after your surgery date. Remove dressing if it begins to fall off, or if it is dirty or damaged before the third day.   Complete by: As directed    Increase activity slowly   Complete by: As directed       Allergies as of 08/23/2023   No Known Allergies      Medication List     TAKE these medications    acetaminophen  500 MG tablet Commonly known as: TYLENOL  Take 1,300 mg by mouth every 6 (six) hours as needed for moderate pain. 650 mg tab   atorvastatin  40 MG tablet Commonly known as: LIPITOR TAKE 1 TABLET BY MOUTH AT BEDTIME  benzonatate  100 MG capsule Commonly known as: Tessalon  Perles Take 2 capsules (200 mg total) by mouth 3 (three) times daily as needed.   clobetasol  cream 0.05 % Commonly known as: TEMOVATE  Apply 1 Application topically 2 (two) times daily.   Eliquis  5 MG Tabs tablet Generic drug: apixaban  Take 1 tablet by mouth twice daily   escitalopram  10 MG tablet Commonly known as: LEXAPRO  Take 1 tablet by mouth once daily   furosemide  20 MG  tablet Commonly known as: LASIX  Take 2 tablets (40 mg total) by mouth daily.   gabapentin  100 MG capsule Commonly known as: NEURONTIN  Take 1 capsule (100 mg total) by mouth 3 (three) times daily as needed.   Lenvima  (10 MG Daily Dose) capsule Generic drug: lenvatinib  10 mg daily dose Take 1 capsule (10 mg total) by mouth daily.   levothyroxine  50 MCG tablet Commonly known as: Synthroid  Take 2 tablets (100 mcg total) by mouth daily before breakfast.   nystatin  powder Commonly known as: MYCOSTATIN /NYSTOP  Apply 1 Application topically 2 (two) times daily.   oxyCODONE  5 MG immediate release tablet Commonly known as: Oxy IR/ROXICODONE  Take 1-2 tablets (5-10 mg total) by mouth every 4 (four) hours as needed for moderate pain (pain score 4-6), severe pain (pain score 7-10) or breakthrough pain.   prochlorperazine  10 MG tablet Commonly known as: COMPAZINE  Take 10 mg by mouth every 6 (six) hours as needed.   silver  sulfADIAZINE  1 % cream Commonly known as: Silvadene  Apply 1 Application topically daily.   terbinafine  1 % cream Commonly known as: LAMISIL  Apply 1 Application topically 2 (two) times daily.               Durable Medical Equipment  (From admission, onward)           Start     Ordered   08/23/23 1047  For home use only DME oxygen  Once       Question Answer Comment  Length of Need 12 Months   Mode or (Route) Nasal cannula   Liters per Minute 2   Frequency Continuous (stationary and portable oxygen unit needed)   Oxygen conserving device Yes   Oxygen delivery system Gas      08/23/23 1046              Discharge Care Instructions  (From admission, onward)           Start     Ordered   08/23/23 0000  If the dressing is still on your incision site when you go home, remove it on the third day after your surgery date. Remove dressing if it begins to fall off, or if it is dirty or damaged before the third day.        08/23/23 1050             Follow-up Information     Rakes, Rock HERO, FNP. Schedule an appointment as soon as possible for a visit in 1 week(s).   Specialty: Family Medicine Contact information: [address] [place] [address] [phone]         [facility] Maralee Chester. Schedule an appointment as soon as possible for a visit in 1 week(s).   Specialty: Orthopedics Contact information: [address] [place]  [address] [phone]               No Known Allergies  Consultations: None   Procedures/Studies: CT CHEST ABDOMEN PELVIS WO CONTRAST Result Date:  08/20/2023 CLINICAL DATA:  Blunt trauma. Renal cell carcinoma status post prior right nephrectomy. EXAM: CT CHEST, ABDOMEN AND PELVIS WITHOUT CONTRAST TECHNIQUE: Multidetector CT imaging of the chest, abdomen and pelvis was performed following the standard protocol without IV contrast. RADIATION DOSE REDUCTION: This exam was performed according to the departmental dose-optimization program which includes automated exposure control, adjustment of the mA and/or kV according to patient size and/or use of iterative reconstruction technique. COMPARISON:  CT dated 07/13/2023. FINDINGS: Evaluation of this exam is limited in the absence of intravenous contrast. CT CHEST FINDINGS Cardiovascular: There is no cardiomegaly or pericardial effusion. There is coronary vascular calcification. Mild atherosclerotic calcification of the thoracic aorta. No aneurysmal dilatation. The central pulmonary arteries are grossly unremarkable. Mediastinum/Nodes: No hilar or mediastinal adenopathy. The esophagus is grossly unremarkable. No mediastinal fluid collection. Lungs/Pleura: Interval decrease in the size of the subpleural nodule in the medial right lung base measuring approximately 1 cm in length on today's exam (previously 16 mm). No new nodule. Bibasilar atelectasis. No focal consolidation, pleural effusion, or pneumothorax. The  central airways are patent. Musculoskeletal: Median sternotomy wires. Degenerative changes of the spine. Compression fractures of T5 and T11 as seen on the prior CT. No new fracture. CT ABDOMEN PELVIS FINDINGS No intra-abdominal free air or free fluid. Hepatobiliary: The liver is unremarkable. No biliary dilatation. Cholecystectomy. No retained calcified stone noted in the central CBD. Pancreas: Unremarkable. No pancreatic ductal dilatation or surrounding inflammatory changes. Spleen: Normal in size without focal abnormality. Adrenals/Urinary Tract: The right adrenal glands unremarkable. Similar or slightly decreased nodule in the left adrenal gland. Status post prior right nephrectomy. No suspicious nodule noted in the nephrectomy bed. Small left renal upper pole cyst. No hydronephrosis or nephrolithiasis on the left. The left ureter and urinary bladder appear unremarkable. Stomach/Bowel: There is moderate stool throughout the colon. There is no bowel obstruction or active inflammation. Several small scattered colonic diverticula noted. The appendix is normal. Vascular/Lymphatic: Moderate aortoiliac atherosclerotic disease. There is a 3.4 cm infrarenal abdominal aortic aneurysm. The IVC is unremarkable. No portal venous gas. There is no adenopathy. Reproductive: The prostate and seminal vesicles are grossly remarkable. Other: Small fat containing left spigelian hernia. Musculoskeletal: Degenerative changes.  No acute osseous pathology. IMPRESSION: 1. No acute/traumatic intrathoracic, abdominal, or pelvic pathology. 2. Interval decrease in the size of the subpleural nodule in the medial right lung base. 3. Status post prior right nephrectomy. No suspicious nodule noted in the nephrectomy bed. 4. Similar or slightly decreased nodule in the left adrenal gland. 5. A 3.4 cm infrarenal abdominal aortic aneurysm. Recommend follow-up ultrasound every 3 years. (Ref.: J Vasc Surg. 2018; 67:2-77 and J Am Coll Radiol  2013;10(10):789-794.) 6.  Aortic Atherosclerosis (ICD10-I70.0). Electronically Signed   By: Vanetta Chou M.D.   On: 08/20/2023 17:17   CT CERVICAL SPINE WO CONTRAST Result Date: 08/20/2023 CLINICAL DATA:  Polytrauma, blunt.  Fall. EXAM: CT CERVICAL SPINE WITHOUT CONTRAST TECHNIQUE: Multidetector CT imaging of the cervical spine was performed without intravenous contrast. Multiplanar CT image reconstructions were also generated. RADIATION DOSE REDUCTION: This exam was performed according to the departmental dose-optimization program which includes automated exposure control, adjustment of the mA and/or kV according to patient size and/or use of iterative reconstruction technique. COMPARISON:  08/09/2020 FINDINGS: Alignment: Normal Skull base and vertebrae: No acute fracture. No primary bone lesion or focal pathologic process. Soft tissues and spinal canal: No prevertebral fluid or swelling. No visible canal hematoma. No acute findings Disc levels: Early disc space  narrowing and spurring in the mid cervical spine. Mild bilateral degenerative facet disease. Upper chest: No acute findings Other: None IMPRESSION: No acute bony abnormality. Electronically Signed   By: Franky Crease M.D.   On: 08/20/2023 17:13   CT MAXILLOFACIAL WO CONTRAST Result Date: 08/20/2023 CLINICAL DATA:  Facial trauma, blunt EXAM: CT MAXILLOFACIAL WITHOUT CONTRAST TECHNIQUE: Multidetector CT imaging of the maxillofacial structures was performed. Multiplanar CT image reconstructions were also generated. RADIATION DOSE REDUCTION: This exam was performed according to the departmental dose-optimization program which includes automated exposure control, adjustment of the mA and/or kV according to patient size and/or use of iterative reconstruction technique. COMPARISON:  None Available. FINDINGS: Osseous: No fracture or mandibular dislocation. No destructive process. Orbits: Negative. No traumatic or inflammatory finding. Sinuses: Clear Soft  tissues: Negative Limited intracranial: See head CT report IMPRESSION: No facial or orbital fracture. Electronically Signed   By: Franky Crease M.D.   On: 08/20/2023 17:11   CT HEAD WO CONTRAST Result Date: 08/20/2023 CLINICAL DATA:  Fall, hit head.  Headache. EXAM: CT HEAD WITHOUT CONTRAST TECHNIQUE: Contiguous axial images were obtained from the base of the skull through the vertex without intravenous contrast. RADIATION DOSE REDUCTION: This exam was performed according to the departmental dose-optimization program which includes automated exposure control, adjustment of the mA and/or kV according to patient size and/or use of iterative reconstruction technique. COMPARISON:  None Available. FINDINGS: Brain: Low-density throughout the white matter compatible with chronic small vessel disease. No acute intracranial abnormality. Specifically, no hemorrhage, hydrocephalus, mass lesion, acute infarction, or significant intracranial injury. Vascular: No hyperdense vessel or unexpected calcification. Skull: No acute calvarial abnormality. Sinuses/Orbits: No acute findings Other: None IMPRESSION: Chronic small vessel disease throughout the deep white matter. No acute intracranial abnormality. Electronically Signed   By: Franky Crease M.D.   On: 08/20/2023 17:09   DG Pelvis Portable Result Date: 08/20/2023 CLINICAL DATA:  Pain after fall. EXAM: PORTABLE PELVIS 1-2 VIEWS COMPARISON:  None Available. FINDINGS: There is no evidence of acute pelvic fracture or diastasis. Evaluation of the sacrum is slightly limited by overlying bowel-gas. Femoral heads are seated within the acetabula. Mild joint space narrowing of the bilateral hips. IMPRESSION: No acute osseous abnormality identified. Electronically Signed   By: Harrietta Sherry M.D.   On: 08/20/2023 16:03   DG Hand Complete Left Result Date: 08/20/2023 CLINICAL DATA:  Pain after fall. EXAM: LEFT HAND - COMPLETE 3+ VIEW COMPARISON:  None Available. FINDINGS: Cortical  angulation with subtle lucency at the ulnar base of the fourth middle phalanx is equivocal for a nondisplaced intra-articular fracture. The remainder of the visualized bones are intact. Borderline widening of the scapholunate interval. Mild radiocarpal and first CMC joint space narrowing and spurring. Diffuse interphalangeal joint space narrowing, most pronounced at the first interphalangeal joint. No radiopaque foreign body. IMPRESSION: 1. Cortical angulation with subtle lucency at the ulnar base of the fourth middle phalanx is equivocal for a nondisplaced intra-articular fracture. Consider further evaluation with CT. 2. Borderline widening of the scapholunate interval, suggestive of age-indeterminate scapholunate ligamentous injury. 3. Mild osteoarthritis. Electronically Signed   By: Harrietta Sherry M.D.   On: 08/20/2023 16:02   DG Chest Port 1 View Result Date: 08/20/2023 CLINICAL DATA:  Fall. EXAM: PORTABLE CHEST 1 VIEW COMPARISON:  07/16/2023. FINDINGS: The heart size and mediastinal contours are unchanged. Prior median sternotomy. No focal consolidation, pleural effusion, or pneumothorax. No acute osseous abnormality. IMPRESSION: No acute findings in the chest. Electronically Signed   By: Harrietta  Lateef M.D.   On: 08/20/2023 15:54     Discharge Exam: Vitals:   08/22/23 2126 08/23/23 0459  BP: 128/84 117/89  Pulse: 78 78  Resp: 20 20  Temp: 99.4 F (37.4 C) 97.8 F (36.6 C)  SpO2: 97% 96%   Vitals:   08/22/23 0337 08/22/23 1426 08/22/23 2126 08/23/23 0459  BP: 107/64 115/70 128/84 117/89  Pulse: 71 77 78 78  Resp: 18 16 20 20   Temp: 98.6 F (37 C) 98.3 F (36.8 C) 99.4 F (37.4 C) 97.8 F (36.6 C)  TempSrc: Oral Oral Oral Oral  SpO2: 92% 96% 97% 96%  Weight:      Height:        General: Pt is alert, awake, not in acute distress Cardiovascular: RRR, S1/S2 +, no rubs, no gallops Respiratory: CTA bilaterally, no wheezing, no rhonchi, 2 L nasal cannula Abdominal: Soft, NT,  ND, bowel sounds + Extremities: no edema, no cyanosis    The results of significant diagnostics from this hospitalization (including imaging, microbiology, ancillary and laboratory) are listed below for reference.     Microbiology: No results found for this or any previous visit (from the past 240 hours).   Labs: BNP (last 3 results) Recent Labs    08/04/23 1518  BNP 11.7   Basic Metabolic Panel: Recent Labs  Lab 08/20/23 1315 08/21/23 0443 08/22/23 0459 08/23/23 0456  NA 137 140 139 136  K 3.8 3.5 3.5 3.4*  CL 98 96* 93* 91*  CO2 29 33* 33* 33*  GLUCOSE 131* 106* 121* 127*  BUN 15 15 14 13   CREATININE 1.16 1.21 1.08 0.99  CALCIUM  8.6* 8.4* 8.5* 8.6*   Liver Function Tests: Recent Labs  Lab 08/20/23 1315  AST 18  ALT 14  ALKPHOS 86  BILITOT 0.7  PROT 5.9*  ALBUMIN 3.2*   No results for input(s): LIPASE, AMYLASE in the last 168 hours. No results for input(s): AMMONIA in the last 168 hours. CBC: Recent Labs  Lab 08/20/23 1315 08/21/23 0443 08/22/23 0459 08/23/23 0456  WBC 29.8* 24.7* 23.1* 27.0*  HGB 13.1 12.3* 12.8* 13.1  HCT 40.8 38.9* 40.3 41.2  MCV 105.4* 107.5* 106.3* 106.2*  PLT 241 214 204 226   Cardiac Enzymes: No results for input(s): CKTOTAL, CKMB, CKMBINDEX, TROPONINI in the last 168 hours. BNP: Invalid input(s): POCBNP CBG: Recent Labs  Lab 08/22/23 1116 08/22/23 1617 08/22/23 2129 08/23/23 0455 08/23/23 0713  GLUCAP 146* 147* 125* 132* 124*   D-Dimer No results for input(s): DDIMER in the last 72 hours. Hgb A1c No results for input(s): HGBA1C in the last 72 hours. Lipid Profile No results for input(s): CHOL, HDL, LDLCALC, TRIG, CHOLHDL, LDLDIRECT in the last 72 hours. Thyroid  function studies No results for input(s): TSH, T4TOTAL, T3FREE, THYROIDAB in the last 72 hours.  Invalid input(s): FREET3 Anemia work up No results for input(s): VITAMINB12, FOLATE, FERRITIN, TIBC,  IRON , RETICCTPCT in the last 72 hours. Urinalysis    Component Value Date/Time   COLORURINE YELLOW 08/20/2023 1604   APPEARANCEUR CLEAR 08/20/2023 1604   LABSPEC 1.019 08/20/2023 1604   PHURINE 6.0 08/20/2023 1604   GLUCOSEU NEGATIVE 08/20/2023 1604   HGBUR NEGATIVE 08/20/2023 1604   BILIRUBINUR NEGATIVE 08/20/2023 1604   KETONESUR NEGATIVE 08/20/2023 1604   PROTEINUR 100 (A) 08/20/2023 1604   UROBILINOGEN 2.0 (H) 11/19/2011 0158   NITRITE NEGATIVE 08/20/2023 1604   LEUKOCYTESUR NEGATIVE 08/20/2023 1604   Sepsis Labs Recent Labs  Lab 08/20/23 1315 08/21/23 0443 08/22/23 0459  08/23/23 0456  WBC 29.8* 24.7* 23.1* 27.0*   Microbiology No results found for this or any previous visit (from the past 240 hours).   Time coordinating discharge: 35 minutes  SIGNED:   Adron JONETTA Fairly, DO [facility] 08/23/2023, 10:57 AM  If 7PM-7AM, please contact night-coverage [email or web address]

## 2023-08-23 NOTE — Progress Notes (Signed)
 Reviewed d/c paperwork with patient and wife. Answered questions. Wheeled stable patient and belongings, including oxygen tank and portable, to main entrance where he was picked up by his wife.

## 2023-08-23 NOTE — Plan of Care (Signed)
  Problem: Education: Goal: Knowledge of General Education information will improve Description: Including pain rating scale, medication(s)/side effects and non-pharmacologic comfort measures 08/23/2023 1323 by Delores Kirsch, RN Outcome: Adequate for Discharge 08/23/2023 1323 by Delores Kirsch, RN Outcome: Progressing   Problem: Health Behavior/Discharge Planning: Goal: Ability to manage health-related needs will improve 08/23/2023 1323 by Delores Kirsch, RN Outcome: Adequate for Discharge 08/23/2023 1323 by Delores Kirsch, RN Outcome: Progressing   Problem: Clinical Measurements: Goal: Ability to maintain clinical measurements within normal limits will improve Outcome: Adequate for Discharge Goal: Will remain free from infection Outcome: Adequate for Discharge Goal: Diagnostic test results will improve Outcome: Adequate for Discharge Goal: Respiratory complications will improve Outcome: Adequate for Discharge Goal: Cardiovascular complication will be avoided Outcome: Adequate for Discharge   Problem: Activity: Goal: Risk for activity intolerance will decrease Outcome: Adequate for Discharge   Problem: Nutrition: Goal: Adequate nutrition will be maintained Outcome: Adequate for Discharge   Problem: Coping: Goal: Level of anxiety will decrease Outcome: Adequate for Discharge   Problem: Elimination: Goal: Will not experience complications related to bowel motility Outcome: Adequate for Discharge Goal: Will not experience complications related to urinary retention Outcome: Adequate for Discharge   Problem: Pain Managment: Goal: General experience of comfort will improve and/or be controlled Outcome: Adequate for Discharge   Problem: Safety: Goal: Ability to remain free from injury will improve Outcome: Adequate for Discharge   Problem: Skin Integrity: Goal: Risk for impaired skin integrity will decrease Outcome: Adequate for Discharge   Problem: Education: Goal:  Ability to describe self-care measures that may prevent or decrease complications (Diabetes Survival Skills Education) will improve Outcome: Adequate for Discharge Goal: Individualized Educational Video(s) Outcome: Adequate for Discharge   Problem: Coping: Goal: Ability to adjust to condition or change in health will improve Outcome: Adequate for Discharge   Problem: Fluid Volume: Goal: Ability to maintain a balanced intake and output will improve Outcome: Adequate for Discharge   Problem: Health Behavior/Discharge Planning: Goal: Ability to identify and utilize available resources and services will improve Outcome: Adequate for Discharge Goal: Ability to manage health-related needs will improve Outcome: Adequate for Discharge   Problem: Metabolic: Goal: Ability to maintain appropriate glucose levels will improve Outcome: Adequate for Discharge   Problem: Nutritional: Goal: Maintenance of adequate nutrition will improve Outcome: Adequate for Discharge Goal: Progress toward achieving an optimal weight will improve Outcome: Adequate for Discharge   Problem: Skin Integrity: Goal: Risk for impaired skin integrity will decrease Outcome: Adequate for Discharge   Problem: Tissue Perfusion: Goal: Adequacy of tissue perfusion will improve Outcome: Adequate for Discharge

## 2023-08-24 ENCOUNTER — Telehealth: Payer: Self-pay

## 2023-08-24 ENCOUNTER — Encounter: Payer: Self-pay | Admitting: Oncology

## 2023-08-24 NOTE — Transitions of Care (Post Inpatient/ED Visit) (Unsigned)
 08/24/2023  Name: George Anthony MRN: [id number] DOB: 1950/02/18  Today's TOC FU Call Status: Today's TOC FU Call Status:: Successful TOC FU Call Completed TOC FU Call Complete Date: 08/24/23 Patient's Name and Date of Birth confirmed.  Transition Care Management Follow-up Telephone Call Date of Discharge: 08/23/23 Discharge Facility: [facility] Type of Discharge: Inpatient Admission Primary Inpatient Discharge Diagnosis:: fall How have you been since you were released from the hospital?: Same Any questions or concerns?: No  Items Reviewed: Did you receive and understand the discharge instructions provided?: Yes Medications obtained,verified, and reconciled?: Yes (Medications Reviewed) Any new allergies since your discharge?: No Dietary orders reviewed?: Yes Do you have support at home?: Yes People in Home [RPT]: spouse  Medications Reviewed Today: Medications Reviewed Today     Reviewed by Emmitt Pan, LPN (Licensed Practical Nurse) on 08/24/23 at 1410  Med List Status: <None>   Medication Order Taking? Sig Documenting Provider Last Dose Status Informant  acetaminophen  (TYLENOL ) 500 MG tablet [id number] Yes Take 1,300 mg by mouth every 6 (six) hours as needed for moderate pain. 650 mg tab [provider]  Active Self, Pharmacy Records  atorvastatin  (LIPITOR) 40 MG tablet [id number] Yes TAKE 1 TABLET BY MOUTH AT BEDTIME Rakes, Rock HERO, FNP  Active Self, Pharmacy Records  benzonatate  (TESSALON  PERLES) 100 MG capsule [id number]  Take 2 capsules (200 mg total) by mouth 3 (three) times daily as needed.  Patient not taking: Reported on 08/24/2023   Severa Rock HERO, FNP  Active Self, Pharmacy Records  clobetasol  cream (TEMOVATE ) 0.05 % [id number] Yes APPLY TOPICALLY TWICE DAILY Kandala, Hyndavi, MD  Active   ELIQUIS  5 MG TABS tablet [id number] Yes Take 1 tablet by mouth twice daily Rakes, Rock HERO, FNP  Active Self, Pharmacy Records  escitalopram  (LEXAPRO ) 10 MG tablet  [id number] Yes Take 1 tablet by mouth once daily Rakes, Rock HERO, FNP  Active Self, Pharmacy Records  furosemide  (LASIX ) 20 MG tablet [id number] Yes Take 2 tablets (40 mg total) by mouth daily. Kandala, Hyndavi, MD  Active Self, Pharmacy Records  gabapentin  (NEURONTIN ) 100 MG capsule [id number] Yes Take 1 capsule (100 mg total) by mouth 3 (three) times daily as needed. Severa Rock HERO, FNP  Active Self, Pharmacy Records  lenvatinib  10 mg daily dose (LENVIMA , 10 MG DAILY DOSE,) capsule [id number] Yes Take 1 capsule (10 mg total) by mouth daily. [name], MD  Active Self, Pharmacy Records  levothyroxine  (SYNTHROID ) 50 MCG tablet [id number] Yes Take 2 tablets (100 mcg total) by mouth daily before breakfast. [name], MD  Active Self, Pharmacy Records  nystatin  (MYCOSTATIN /NYSTOP ) powder [id number] Yes Apply 1 Application topically 2 (two) times daily. [provider]  Active Self, Pharmacy Records  oxyCODONE  (OXY IR/ROXICODONE ) 5 MG immediate release tablet [id number] Yes Take 1-2 tablets (5-10 mg total) by mouth every 4 (four) hours as needed for moderate pain (pain score 4-6), severe pain (pain score 7-10) or breakthrough pain. Maree, Pratik D, DO  Active   prochlorperazine  (COMPAZINE ) 10 MG tablet [id number] Yes Take 10 mg by mouth every 6 (six) hours as needed. [provider]  Active Self, Pharmacy Records  silver  sulfADIAZINE  (SILVADENE ) 1 % cream [id number] Yes Apply 1 Application topically daily. Severa Rock HERO, FNP  Active Self, Pharmacy Records  terbinafine  (LAMISIL ) 1 % cream [id number]  Apply 1 Application topically 2 (two) times daily.  Patient not taking: Reported on 08/24/2023   [name], MD  Active Self, Pharmacy Records  Med List Note (  Leonard, Alyson N, RPH-CPP 07/08/23 9044): Cabometyx filled at [facility] (Specialty)            Home Care and Equipment/Supplies: Were Home Health Services Ordered?: NA Any new equipment or medical supplies  ordered?: Yes Name of Medical supply agency?: [employer] Were you able to get the equipment/medical supplies?: Yes Do you have any questions related to the use of the equipment/supplies?: No  Functional Questionnaire: Do you need assistance with bathing/showering or dressing?: Yes Do you need assistance with meal preparation?: Yes Do you need assistance with eating?: No Do you have difficulty maintaining continence: No Do you need assistance with getting out of bed/getting out of a chair/moving?: No Do you have difficulty managing or taking your medications?: No  Follow up appointments reviewed: PCP Follow-up appointment confirmed?: No (no avail appts, sent message to staff to schedule) MD Provider Line Number:[phone] Given: No Specialist Hospital Follow-up appointment confirmed?: No Reason Specialist Follow-Up Not Confirmed: TOC Calling Clinician Notified Provider Practice of Needed Appointment Do you need transportation to your follow-up appointment?: No Do you understand care options if your condition(s) worsen?: Yes-patient verbalized understanding    SIGNATURE Julian Lemmings, LPN [facility] Nurse Health Advisor Direct Dial [phone]

## 2023-08-25 NOTE — Telephone Encounter (Signed)
 Appt scheduled for 08/27/2023

## 2023-08-27 ENCOUNTER — Encounter: Payer: Self-pay | Admitting: Family Medicine

## 2023-08-27 ENCOUNTER — Telehealth

## 2023-08-27 DIAGNOSIS — J9601 Acute respiratory failure with hypoxia: Secondary | ICD-10-CM | POA: Diagnosis not present

## 2023-08-27 DIAGNOSIS — Y92009 Unspecified place in unspecified non-institutional (private) residence as the place of occurrence of the external cause: Secondary | ICD-10-CM

## 2023-08-27 DIAGNOSIS — I7143 Infrarenal abdominal aortic aneurysm, without rupture: Secondary | ICD-10-CM | POA: Diagnosis not present

## 2023-08-27 DIAGNOSIS — L03116 Cellulitis of left lower limb: Secondary | ICD-10-CM | POA: Diagnosis not present

## 2023-08-27 DIAGNOSIS — S62635S Displaced fracture of distal phalanx of left ring finger, sequela: Secondary | ICD-10-CM | POA: Diagnosis not present

## 2023-08-27 DIAGNOSIS — Z09 Encounter for follow-up examination after completed treatment for conditions other than malignant neoplasm: Secondary | ICD-10-CM

## 2023-08-27 DIAGNOSIS — L03115 Cellulitis of right lower limb: Secondary | ICD-10-CM

## 2023-08-27 DIAGNOSIS — C641 Malignant neoplasm of right kidney, except renal pelvis: Secondary | ICD-10-CM | POA: Diagnosis not present

## 2023-08-27 DIAGNOSIS — Z794 Long term (current) use of insulin: Secondary | ICD-10-CM

## 2023-08-27 DIAGNOSIS — C9110 Chronic lymphocytic leukemia of B-cell type not having achieved remission: Secondary | ICD-10-CM | POA: Diagnosis not present

## 2023-08-27 DIAGNOSIS — E11649 Type 2 diabetes mellitus with hypoglycemia without coma: Secondary | ICD-10-CM

## 2023-08-27 DIAGNOSIS — W19XXXD Unspecified fall, subsequent encounter: Secondary | ICD-10-CM | POA: Diagnosis not present

## 2023-08-27 NOTE — Progress Notes (Signed)
 Virtual Visit via Video   I connected with patient on 08/27/23 at 1615 by a video enabled telemedicine application and verified that I am speaking with the correct person using two identifiers.  Location patient: Home Location provider: [facility] Persons participating in the virtual visit: Patient and Provider  I discussed the limitations of evaluation and management by telemedicine and the availability of in person appointments. The patient expressed understanding and agreed to proceed.  Subjective:   HPI:  Pt presents today for  Chief Complaint  Patient presents with   Hospitalization Follow-up    [facility] 08/20/2023-08/23/2023 for fall   George Anthony is a 73 year old male who presents with a recent fall resulting in a left hand fracture and ongoing leg wounds. He is accompanied by a caregiver who assists with communication and care coordination.  He experienced a fall in front of his house, resulting in a fracture of his left ring finger. He has not yet followed up with an orthopedic specialist for this injury due to transportation issues and is unsure about the details of the follow-up plan for his hand fracture.  He has ongoing issues with his legs, which were bleeding after he removed bandages at home. He has been applying Silvadene  cream to the wounds. A biopsy was suggested, but Dr. Shona told the caregiver that he could tell the wounds were not from the cancer. He is using two different creams, including benzoyl peroxide, for wound care and elevates his legs using a wedge. He is scheduled to follow up on the 11th but will not be able to due to ambulation and transportation issues.   He is currently on oxygen therapy and has a portable oxygen canister for mobility. However, transportation remains a challenge due to the lack of a handrail at home, which is pending installation by the cancer center's contractors. This has delayed his ability to attend  medical appointments.  His appetite is reduced, as he is eating some but not a lot.    ROS per HPI   Patient Active Problem List   Diagnosis Date Noted   Acute respiratory failure with hypoxia (HCC) 08/20/2023   Infrarenal abdominal aortic aneurysm (AAA) without rupture (HCC) 08/20/2023   Leg wound, right 08/20/2023   Dermatitis 08/14/2023   Leg wound, left 07/15/2023   Hypothyroidism 07/03/2023   Hypoglycemia 07/03/2023   AKI (acute kidney injury) (HCC) 07/03/2023   Intertrigo 06/11/2023   CLL (chronic lymphocytic leukemia) (HCC) 06/05/2023   Thrombocytopenia (HCC) 09/28/2022   Cellulitis, scrotum 09/28/2022   Diabetic nephropathy associated with type 2 diabetes mellitus (HCC) 06/06/2021   Personal history of DVT (deep vein thrombosis) 02/21/2021   Generalized anxiety disorder 11/07/2020   History of renal cell carcinoma 11/07/2020   Renal cell carcinoma of right kidney (HCC) 08/23/2020   Positive colorectal cancer screening using Cologuard test 07/14/2019   Constipation 07/14/2019   Hyperlipidemia associated with type 2 diabetes mellitus (HCC) 08/26/2018   Hypertension associated with type 2 diabetes mellitus (HCC) 08/26/2018   Morbid (severe) obesity due to excess calories (HCC) 08/26/2018   Cigarette nicotine dependence without complication 01/03/2016   Primary osteoarthritis 01/03/2016   Allergic rhinitis 06/22/2014   Type 2 diabetes mellitus with hypoglycemia, with long-term current use of insulin  (HCC) 09/15/2013   Alcohol abuse 11/19/2011   Tobacco use 11/19/2011   Fatty liver 11/19/2011    Social History   Tobacco Use   Smoking status: Every Day    Current  packs/day: 1.00    Average packs/day: 1 pack/day for 40.0 years (40.0 ttl pk-yrs)    Types: Cigarettes   Smokeless tobacco: Never  Substance Use Topics   Alcohol use: Yes    Alcohol/week: 3.0 standard drinks of alcohol    Types: 3 Cans of beer per week    Comment: drinking a couple times a month for the  last year.- used to drink 2 12 packs a week    Current Outpatient Medications:    acetaminophen  (TYLENOL ) 500 MG tablet, Take 1,300 mg by mouth every 6 (six) hours as needed for moderate pain. 650 mg tab, Disp: , Rfl:    atorvastatin  (LIPITOR) 40 MG tablet, TAKE 1 TABLET BY MOUTH AT BEDTIME, Disp: 90 tablet, Rfl: 0   benzonatate  (TESSALON  PERLES) 100 MG capsule, Take 2 capsules (200 mg total) by mouth 3 (three) times daily as needed. (Patient not taking: Reported on 08/24/2023), Disp: 90 capsule, Rfl: 0   clobetasol  cream (TEMOVATE ) 0.05 %, APPLY TOPICALLY TWICE DAILY, Disp: 30 g, Rfl: 0   ELIQUIS  5 MG TABS tablet, Take 1 tablet by mouth twice daily, Disp: 180 tablet, Rfl: 0   escitalopram  (LEXAPRO ) 10 MG tablet, Take 1 tablet by mouth once daily, Disp: 90 tablet, Rfl: 0   furosemide  (LASIX ) 20 MG tablet, Take 2 tablets (40 mg total) by mouth daily., Disp: 90 tablet, Rfl: 0   gabapentin  (NEURONTIN ) 100 MG capsule, Take 1 capsule (100 mg total) by mouth 3 (three) times daily as needed., Disp: 30 capsule, Rfl: 3   lenvatinib  10 mg daily dose (LENVIMA , 10 MG DAILY DOSE,) capsule, Take 1 capsule (10 mg total) by mouth daily., Disp: 30 capsule, Rfl: 3   levothyroxine  (SYNTHROID ) 50 MCG tablet, Take 2 tablets (100 mcg total) by mouth daily before breakfast., Disp: 60 tablet, Rfl: 2   nystatin  (MYCOSTATIN /NYSTOP ) powder, Apply 1 Application topically 2 (two) times daily., Disp: , Rfl:    oxyCODONE  (OXY IR/ROXICODONE ) 5 MG immediate release tablet, Take 1-2 tablets (5-10 mg total) by mouth every 4 (four) hours as needed for moderate pain (pain score 4-6), severe pain (pain score 7-10) or breakthrough pain., Disp: 20 tablet, Rfl: 0   prochlorperazine  (COMPAZINE ) 10 MG tablet, Take 10 mg by mouth every 6 (six) hours as needed., Disp: , Rfl:    silver  sulfADIAZINE  (SILVADENE ) 1 % cream, Apply 1 Application topically daily., Disp: 50 g, Rfl: 0   terbinafine  (LAMISIL ) 1 % cream, Apply 1 Application topically 2  (two) times daily. (Patient not taking: Reported on 08/24/2023), Disp: 30 g, Rfl: 2  No Known Allergies  Objective:   There were no vitals taken for this visit.  Patient is resting in bed.  Head is normocephalic, atraumatic.  Has oxygen on at 2L via [place] Speech is clear and coherent. Patient is alert and oriented at baseline.  Wife is with patient and provides most of information.   Assessment and Plan:   George Anthony was seen today for hospitalization follow-up.  Diagnoses and all orders for this visit:  Hospital discharge follow-up -     Ambulatory referral to Home Health  Fall in home, subsequent encounter -     Ambulatory referral to Home Health  Renal cell carcinoma of right kidney [facility]) -     Ambulatory referral to Home Health  CLL (chronic lymphocytic leukemia) (HCC) -     Ambulatory referral to Home Health  Bilateral cellulitis of lower leg -     Ambulatory referral to Home Health  Acute respiratory failure with hypoxia (HCC) -     Ambulatory referral to Home Health  Infrarenal abdominal aortic aneurysm (AAA) without rupture (HCC) -     Ambulatory referral to Home Health  Type 2 diabetes mellitus with hypoglycemia without coma, with long-term current use of insulin  (HCC) -     Ambulatory referral to Home Health  Closed displaced fracture of distal phalanx of left ring finger, sequela -     Ambulatory referral to Home Health  Today's visit was for Transitional Care Management.  The patient was discharged from [facility] on 08/23/2023 with a primary diagnosis of fall, respiratory failure with hypoxia.SABRA Pass with the patient and/or caregiver, by a clinical staff member, was made on 08/24/2023 and was documented as a telephone encounter within the EMR.  Through chart review and discussion with the patient I have determined that management of their condition is of high complexity.          Chronic bilateral lower extremity wounds Chronic wounds on both legs  with bleeding post-bandage removal. Previous treatment with Silvadene  cream. Biopsy by Dr. Shona confirmed non-cancerous nature. Current treatment includes specific creams and leg elevation with a wedge. Mobility issues and lack of home support hinder appointment attendance. - Refer to home health for wound care management at home - Ensure follow-up appointment with Dr. Shona on August 31, 2023 - Apply prescribed creams as directed by Dr. Shona - Elevate legs using a wedge as advised   Fracture of left ring finger Fracture of the left ring finger. Transportation issues prevent current orthopedic follow-up. Handrail installation pending to aid mobility and appointment attendance. - Ensure follow-up with orthopedic specialist for fracture management - Coordinate installation of handrail to aid mobility        Return if symptoms worsen or fail to improve.  George Bruns, FNP-C [facility] [address] [place], [place] [address] [phone]  08/27/2023  Time spent with the patient: 20 minutes, of which >50% was spent in obtaining information about symptoms, reviewing previous labs, evaluations, and treatments, counseling about condition (please see the discussed topics above), and developing a plan to further investigate it; had a number of questions which I addressed.

## 2023-08-31 ENCOUNTER — Other Ambulatory Visit: Payer: Self-pay | Admitting: *Deleted

## 2023-08-31 ENCOUNTER — Telehealth: Payer: Self-pay | Admitting: Family Medicine

## 2023-08-31 MED ORDER — OXYCODONE HCL 5 MG PO TABS: 5.0000 mg | ORAL_TABLET | ORAL | 0 refills | Status: DC | PRN

## 2023-08-31 NOTE — Telephone Encounter (Signed)
 Copied from CRM #[id number]. Topic: Clinical - Home Health Verbal Orders >> Aug 31, 2023 11:17 AM Harlene ORN wrote: Caller/Agency: [facility] Callback Number: [phone]  Service Requested: Wound Care Frequency: n/a Any new concerns about the patient? Yes - issues with mobility.

## 2023-09-01 NOTE — Telephone Encounter (Signed)
 Family asking about update on home health referral

## 2023-09-02 ENCOUNTER — Telehealth: Payer: Self-pay | Admitting: *Deleted

## 2023-09-02 DIAGNOSIS — C641 Malignant neoplasm of right kidney, except renal pelvis: Secondary | ICD-10-CM | POA: Diagnosis not present

## 2023-09-02 DIAGNOSIS — E1159 Type 2 diabetes mellitus with other circulatory complications: Secondary | ICD-10-CM | POA: Diagnosis not present

## 2023-09-02 DIAGNOSIS — E11649 Type 2 diabetes mellitus with hypoglycemia without coma: Secondary | ICD-10-CM | POA: Diagnosis not present

## 2023-09-02 DIAGNOSIS — E1121 Type 2 diabetes mellitus with diabetic nephropathy: Secondary | ICD-10-CM | POA: Diagnosis not present

## 2023-09-02 DIAGNOSIS — L03115 Cellulitis of right lower limb: Secondary | ICD-10-CM | POA: Diagnosis not present

## 2023-09-02 DIAGNOSIS — C9110 Chronic lymphocytic leukemia of B-cell type not having achieved remission: Secondary | ICD-10-CM | POA: Diagnosis not present

## 2023-09-02 NOTE — Telephone Encounter (Signed)
 Copied from CRM [id number]. Topic: Clinical - Home Health Verbal Orders >> Sep 02, 2023  4:13 PM Roselie BROCKS wrote: Caller/Agency: [facility] Callback Number: [phone] Service Requested: Physical Therapy Frequency: 1 one a week for 9 weeks Any new concerns about the patient? Yes His legs are really bad , patient is none compliant on creams.

## 2023-09-03 ENCOUNTER — Inpatient Hospital Stay

## 2023-09-03 ENCOUNTER — Inpatient Hospital Stay: Admitting: Oncology

## 2023-09-03 DIAGNOSIS — C641 Malignant neoplasm of right kidney, except renal pelvis: Secondary | ICD-10-CM

## 2023-09-03 NOTE — Telephone Encounter (Signed)
 Verbal given

## 2023-09-04 ENCOUNTER — Other Ambulatory Visit: Payer: Self-pay | Admitting: Family Medicine

## 2023-09-04 ENCOUNTER — Other Ambulatory Visit: Payer: Self-pay

## 2023-09-04 DIAGNOSIS — F411 Generalized anxiety disorder: Secondary | ICD-10-CM

## 2023-09-08 ENCOUNTER — Other Ambulatory Visit: Payer: Self-pay

## 2023-09-10 ENCOUNTER — Other Ambulatory Visit: Payer: Self-pay | Admitting: Oncology

## 2023-09-16 ENCOUNTER — Other Ambulatory Visit: Payer: Self-pay | Admitting: Family Medicine

## 2023-09-16 DIAGNOSIS — E1159 Type 2 diabetes mellitus with other circulatory complications: Secondary | ICD-10-CM

## 2023-09-17 ENCOUNTER — Ambulatory Visit (INDEPENDENT_AMBULATORY_CARE_PROVIDER_SITE_OTHER)

## 2023-09-17 DIAGNOSIS — C9110 Chronic lymphocytic leukemia of B-cell type not having achieved remission: Secondary | ICD-10-CM | POA: Diagnosis not present

## 2023-09-17 DIAGNOSIS — L03115 Cellulitis of right lower limb: Secondary | ICD-10-CM

## 2023-09-17 DIAGNOSIS — L304 Erythema intertrigo: Secondary | ICD-10-CM | POA: Diagnosis not present

## 2023-09-17 DIAGNOSIS — C641 Malignant neoplasm of right kidney, except renal pelvis: Secondary | ICD-10-CM

## 2023-09-17 DIAGNOSIS — I7143 Infrarenal abdominal aortic aneurysm, without rupture: Secondary | ICD-10-CM | POA: Diagnosis not present

## 2023-09-17 DIAGNOSIS — I152 Hypertension secondary to endocrine disorders: Secondary | ICD-10-CM

## 2023-09-17 DIAGNOSIS — L03116 Cellulitis of left lower limb: Secondary | ICD-10-CM

## 2023-09-17 DIAGNOSIS — E1121 Type 2 diabetes mellitus with diabetic nephropathy: Secondary | ICD-10-CM | POA: Diagnosis not present

## 2023-09-17 DIAGNOSIS — E1159 Type 2 diabetes mellitus with other circulatory complications: Secondary | ICD-10-CM | POA: Diagnosis not present

## 2023-09-17 DIAGNOSIS — E11649 Type 2 diabetes mellitus with hypoglycemia without coma: Secondary | ICD-10-CM | POA: Diagnosis not present

## 2023-09-17 DIAGNOSIS — K760 Fatty (change of) liver, not elsewhere classified: Secondary | ICD-10-CM

## 2023-09-22 ENCOUNTER — Other Ambulatory Visit: Payer: Self-pay

## 2023-09-22 ENCOUNTER — Other Ambulatory Visit (HOSPITAL_COMMUNITY): Payer: Self-pay

## 2023-09-22 NOTE — Progress Notes (Signed)
 Specialty Pharmacy Refill Coordination Note  George Anthony is a 73 y.o. male contacted today regarding refills of specialty medication(s) Lenvatinib  Mesylate (LENVIMA )   Patient requested Delivery   Delivery date: 09/24/23   Verified address: [address] [place] [address]   Medication will be filled on 09/23/23.

## 2023-09-23 ENCOUNTER — Other Ambulatory Visit: Payer: Self-pay

## 2023-09-24 DIAGNOSIS — E1121 Type 2 diabetes mellitus with diabetic nephropathy: Secondary | ICD-10-CM | POA: Diagnosis not present

## 2023-09-24 DIAGNOSIS — C641 Malignant neoplasm of right kidney, except renal pelvis: Secondary | ICD-10-CM | POA: Diagnosis not present

## 2023-09-24 DIAGNOSIS — E11649 Type 2 diabetes mellitus with hypoglycemia without coma: Secondary | ICD-10-CM | POA: Diagnosis not present

## 2023-09-24 DIAGNOSIS — E1159 Type 2 diabetes mellitus with other circulatory complications: Secondary | ICD-10-CM | POA: Diagnosis not present

## 2023-09-24 DIAGNOSIS — L03115 Cellulitis of right lower limb: Secondary | ICD-10-CM | POA: Diagnosis not present

## 2023-09-24 DIAGNOSIS — C9110 Chronic lymphocytic leukemia of B-cell type not having achieved remission: Secondary | ICD-10-CM | POA: Diagnosis not present

## 2023-09-25 ENCOUNTER — Other Ambulatory Visit

## 2023-09-25 ENCOUNTER — Ambulatory Visit: Admitting: Oncology

## 2023-09-25 ENCOUNTER — Ambulatory Visit

## 2023-09-29 ENCOUNTER — Other Ambulatory Visit: Payer: Self-pay | Admitting: *Deleted

## 2023-09-29 MED ORDER — OXYCODONE HCL 5 MG PO TABS: 5.0000 mg | ORAL_TABLET | ORAL | 0 refills | Status: DC | PRN

## 2023-10-01 ENCOUNTER — Other Ambulatory Visit: Payer: Self-pay | Admitting: Family Medicine

## 2023-10-01 DIAGNOSIS — C641 Malignant neoplasm of right kidney, except renal pelvis: Secondary | ICD-10-CM | POA: Diagnosis not present

## 2023-10-01 DIAGNOSIS — E1159 Type 2 diabetes mellitus with other circulatory complications: Secondary | ICD-10-CM | POA: Diagnosis not present

## 2023-10-01 DIAGNOSIS — C9110 Chronic lymphocytic leukemia of B-cell type not having achieved remission: Secondary | ICD-10-CM | POA: Diagnosis not present

## 2023-10-01 DIAGNOSIS — E11649 Type 2 diabetes mellitus with hypoglycemia without coma: Secondary | ICD-10-CM | POA: Diagnosis not present

## 2023-10-01 DIAGNOSIS — I152 Hypertension secondary to endocrine disorders: Secondary | ICD-10-CM

## 2023-10-01 DIAGNOSIS — E1121 Type 2 diabetes mellitus with diabetic nephropathy: Secondary | ICD-10-CM | POA: Diagnosis not present

## 2023-10-01 DIAGNOSIS — L03115 Cellulitis of right lower limb: Secondary | ICD-10-CM | POA: Diagnosis not present

## 2023-10-09 DIAGNOSIS — C9110 Chronic lymphocytic leukemia of B-cell type not having achieved remission: Secondary | ICD-10-CM | POA: Diagnosis not present

## 2023-10-09 DIAGNOSIS — E1121 Type 2 diabetes mellitus with diabetic nephropathy: Secondary | ICD-10-CM | POA: Diagnosis not present

## 2023-10-09 DIAGNOSIS — E1159 Type 2 diabetes mellitus with other circulatory complications: Secondary | ICD-10-CM | POA: Diagnosis not present

## 2023-10-09 DIAGNOSIS — E11649 Type 2 diabetes mellitus with hypoglycemia without coma: Secondary | ICD-10-CM | POA: Diagnosis not present

## 2023-10-09 DIAGNOSIS — L03115 Cellulitis of right lower limb: Secondary | ICD-10-CM | POA: Diagnosis not present

## 2023-10-09 DIAGNOSIS — C641 Malignant neoplasm of right kidney, except renal pelvis: Secondary | ICD-10-CM | POA: Diagnosis not present

## 2023-10-13 ENCOUNTER — Other Ambulatory Visit: Payer: Self-pay

## 2023-10-13 ENCOUNTER — Other Ambulatory Visit: Payer: Self-pay | Admitting: Family Medicine

## 2023-10-13 DIAGNOSIS — E1121 Type 2 diabetes mellitus with diabetic nephropathy: Secondary | ICD-10-CM

## 2023-10-14 DIAGNOSIS — E1121 Type 2 diabetes mellitus with diabetic nephropathy: Secondary | ICD-10-CM | POA: Diagnosis not present

## 2023-10-14 DIAGNOSIS — E11649 Type 2 diabetes mellitus with hypoglycemia without coma: Secondary | ICD-10-CM | POA: Diagnosis not present

## 2023-10-14 DIAGNOSIS — E1159 Type 2 diabetes mellitus with other circulatory complications: Secondary | ICD-10-CM | POA: Diagnosis not present

## 2023-10-14 DIAGNOSIS — C9110 Chronic lymphocytic leukemia of B-cell type not having achieved remission: Secondary | ICD-10-CM | POA: Diagnosis not present

## 2023-10-14 DIAGNOSIS — C641 Malignant neoplasm of right kidney, except renal pelvis: Secondary | ICD-10-CM | POA: Diagnosis not present

## 2023-10-14 DIAGNOSIS — L03115 Cellulitis of right lower limb: Secondary | ICD-10-CM | POA: Diagnosis not present

## 2023-10-15 ENCOUNTER — Inpatient Hospital Stay

## 2023-10-15 ENCOUNTER — Inpatient Hospital Stay: Admitting: Oncology

## 2023-10-15 ENCOUNTER — Inpatient Hospital Stay: Attending: Oncology

## 2023-10-15 DIAGNOSIS — C641 Malignant neoplasm of right kidney, except renal pelvis: Secondary | ICD-10-CM

## 2023-10-17 ENCOUNTER — Other Ambulatory Visit: Payer: Self-pay | Admitting: Family Medicine

## 2023-10-17 DIAGNOSIS — E1159 Type 2 diabetes mellitus with other circulatory complications: Secondary | ICD-10-CM

## 2023-10-19 ENCOUNTER — Other Ambulatory Visit: Payer: Self-pay

## 2023-10-21 ENCOUNTER — Other Ambulatory Visit: Payer: Self-pay

## 2023-10-21 ENCOUNTER — Ambulatory Visit (INDEPENDENT_AMBULATORY_CARE_PROVIDER_SITE_OTHER)

## 2023-10-21 VITALS — BP 135/89 | HR 70 | Ht 67.0 in | Wt 206.0 lb

## 2023-10-21 DIAGNOSIS — Z0000 Encounter for general adult medical examination without abnormal findings: Secondary | ICD-10-CM

## 2023-10-21 NOTE — Progress Notes (Signed)
 Subjective:   George Anthony is a 73 y.o. who presents for a Medicare Wellness preventive visit.  As a reminder, Annual Wellness Visits don't include a physical exam, and some assessments may be limited, especially if this visit is performed virtually. We may recommend an in-person follow-up visit with your provider if needed.  Visit Complete: Virtual I connected with  George Anthony on 10/21/23 by a audio enabled telemedicine application and verified that I am speaking with the correct person using two identifiers.  Patient Location: Home  Provider Location: Home Office  I discussed the limitations of evaluation and management by telemedicine. The patient expressed understanding and agreed to proceed.  Vital Signs: Because this visit was a virtual/telehealth visit, some criteria may be missing or patient reported. Any vitals not documented were not able to be obtained and vitals that have been documented are patient reported.  VideoDeclined- This patient declined Librarian, academic. Therefore the visit was completed with audio only.  Persons Participating in Visit: Patient.  AWV Questionnaire: No: Patient Medicare AWV questionnaire was not completed prior to this visit.  Cardiac Risk Factors include: advanced age (>92men, >64 women);diabetes mellitus;dyslipidemia;hypertension;smoking/ tobacco exposure;male gender     Objective:    Today's Vitals   10/21/23 0858  BP: 135/89  Pulse: 70  Weight: 206 lb (93.4 kg)  Height: 5' 7 (1.702 m)   Body mass index is 32.26 kg/m.     10/21/2023    9:05 AM 08/20/2023    8:43 PM 08/20/2023    1:10 PM 08/14/2023   10:33 AM 07/27/2023   12:48 PM 07/15/2023   11:30 AM 07/15/2023    8:17 AM  Advanced Directives  Does Patient Have a Medical Advance Directive? No No No No No No No  Would patient like information on creating a medical advance directive?  No - Patient declined No - Patient declined No - Patient  declined No - Patient declined No - Patient declined     Current Medications (verified) Outpatient Encounter Medications as of 10/21/2023  Medication Sig   acetaminophen  (TYLENOL ) 500 MG tablet Take 1,300 mg by mouth every 6 (six) hours as needed for moderate pain. 650 mg tab   atorvastatin  (LIPITOR) 40 MG tablet TAKE 1 TABLET BY MOUTH AT BEDTIME   clobetasol  cream (TEMOVATE ) 0.05 % APPLY TOPICALLY TWICE DAILY   ELIQUIS  5 MG TABS tablet Take 1 tablet by mouth twice daily   escitalopram  (LEXAPRO ) 10 MG tablet Take 1 tablet by mouth once daily   furosemide  (LASIX ) 20 MG tablet Take 2 tablets by mouth once daily   gabapentin  (NEURONTIN ) 100 MG capsule Take 1 capsule by mouth three times daily as needed   lenvatinib  10 mg daily dose (LENVIMA , 10 MG DAILY DOSE,) capsule Take 1 capsule (10 mg total) by mouth daily.   levothyroxine  (SYNTHROID ) 50 MCG tablet Take 2 tablets (100 mcg total) by mouth daily before breakfast.   nystatin  (MYCOSTATIN /NYSTOP ) powder Apply 1 Application topically 2 (two) times daily.   oxyCODONE  (OXY IR/ROXICODONE ) 5 MG immediate release tablet Take 1-2 tablets (5-10 mg total) by mouth every 4 (four) hours as needed for moderate pain (pain score 4-6), severe pain (pain score 7-10) or breakthrough pain.   prochlorperazine  (COMPAZINE ) 10 MG tablet Take 10 mg by mouth every 6 (six) hours as needed.   silver  sulfADIAZINE  (SILVADENE ) 1 % cream Apply 1 Application topically daily.   benzonatate  (TESSALON  PERLES) 100 MG capsule Take 2 capsules (200 mg total)  by mouth 3 (three) times daily as needed. (Patient not taking: Reported on 10/21/2023)   terbinafine  (LAMISIL ) 1 % cream Apply 1 Application topically 2 (two) times daily. (Patient not taking: Reported on 10/21/2023)   No facility-administered encounter medications on file as of 10/21/2023.    Allergies (verified) Patient has no known allergies.   History: Past Medical History:  Diagnosis Date   Anxiety    Diabetes mellitus  without complication (HCC)    Diabetic nephropathy associated with type 2 diabetes mellitus (HCC) 02/21/2021   DVT (deep venous thrombosis) (HCC)    Enlarged heart    Hyperlipidemia    Hypertension    Renal cell carcinoma [facility])    Past Surgical History:  Procedure Laterality Date   BIOPSY  09/16/2019   Procedure: BIOPSY;  Surgeon: Cindie Carlin POUR, DO;  [facility];  Service: Endoscopy;;   COLONOSCOPY WITH PROPOFOL  N/A 09/16/2019   Procedure: COLONOSCOPY WITH PROPOFOL ;  Surgeon: Cindie Carlin POUR, DO;  [facility];  Service: Endoscopy;  Laterality: N/A;  7:30am   POLYPECTOMY  09/16/2019   Procedure: POLYPECTOMY;  Surgeon: Cindie Carlin POUR, DO;  Location: [facility];  Service: Endoscopy;;   Family History  Problem Relation Age of Onset   Emphysema Mother    Drug abuse Sister    Colon cancer Neg Hx    Social History   Socioeconomic History   Marital status: Married    Spouse name: charlene   Number of children: 1   Years of education: Not on file   Highest education level: Not on file  Occupational History    Employer: [employer]   Occupation: Retired  Tobacco Use   Smoking status: Every Day    Current packs/day: 1.00    Average packs/day: 1 pack/day for 40.0 years (40.0 ttl pk-yrs)    Types: Cigarettes   Smokeless tobacco: Never  Vaping Use   Vaping status: Never Used  Substance and Sexual Activity   Alcohol use: Yes    Alcohol/week: 3.0 standard drinks of alcohol    Types: 3 Cans of beer per week    Comment: drinking a couple times a month for the last year.- used to drink 2 12 packs a week   Drug use: No   Sexual activity: Yes  Other Topics Concern   Not on file  Social History Narrative   Not on file   Social Drivers of Health   Financial Resource Strain: Low Risk  (10/21/2023)   Overall Financial Resource Strain (CARDIA)    Difficulty of Paying Living Expenses: Not hard at all  Food Insecurity: No Food Insecurity  (10/21/2023)   Hunger Vital Sign    Worried About Running Out of Food in the Last Year: Never true    Ran Out of Food in the Last Year: Never true  Transportation Needs: No Transportation Needs (10/21/2023)   PRAPARE - Administrator, Civil Service (Medical): No    Lack of Transportation (Non-Medical): No  Physical Activity: Inactive (10/21/2023)   Exercise Vital Sign    Days of Exercise per Week: 0 days    Minutes of Exercise per Session: 0 min  Stress: No Stress Concern Present (10/21/2023)   [employer] of Occupational Health - Occupational Stress Questionnaire    Feeling of Stress: Not at all  Social Connections: Moderately Isolated (10/21/2023)   Social Connection and Isolation Panel    Frequency of Communication with Friends and Family: More than three times  a week    Frequency of Social Gatherings with Friends and Family: More than three times a week    Attends Religious Services: Never    Database administrator or Organizations: No    Attends Engineer, structural: Never    Marital Status: Married    Tobacco Counseling Ready to quit: Not Answered Counseling given: Yes    Clinical Intake:  Pre-visit preparation completed: Yes  Pain : No/denies pain     BMI - recorded: 32.26 Nutritional Status: BMI > 30  Obese Nutritional Risks: None Diabetes: Yes  Lab Results  Component Value Date   HGBA1C 6.5 (H) 07/23/2023   HGBA1C 6.1 (H) 07/03/2023   HGBA1C 5.5 04/09/2023     How often do you need to have someone help you when you read instructions, pamphlets, or other written materials from your doctor or pharmacy?: 3 - Sometimes (pts wife)  Interpreter Needed?: No  Information entered by :: alia t/cma   Activities of Daily Living     10/21/2023    9:02 AM 08/20/2023    8:43 PM  In your present state of health, do you have any difficulty performing the following activities:  Hearing? 0 0  Vision? 1 0  Difficulty concentrating or making  decisions? 0 0  Walking or climbing stairs? 0   Dressing or bathing? 0   Doing errands, shopping? 1 0  Comment pts wife   Quarry manager and eating ? N   Using the Toilet? N   In the past six months, have you accidently leaked urine? Y   Do you have problems with loss of bowel control? N   Managing your Medications? N   Managing your Finances? N   Housekeeping or managing your Housekeeping? N     Patient Care Team: Severa Rock HERO, FNP as PCP - General (Family Medicine) Cindie Carlin POUR, DO as Consulting Physician (Internal Medicine) Ladora Ross Lacy Phebe, MD as Referring Physician (Optometry) Greissinger, April Macario, PA-C (Hematology and Oncology)  I have updated your Care Teams any recent Medical Services you may have received from other providers in the past year.     Assessment:   This is a routine wellness examination for George Anthony.  Hearing/Vision screen Hearing Screening - Comments:: Pt denies hearing dif Vision Screening - Comments:: Pt has vision dif/pt goes [facility] w/Dr. Jama, in [place], [place]/last ov been a yr   Goals Addressed             This Visit's Progress    Patient Stated       Suggest to start being a little more active, ie walking like 5-67mins a day       Depression Screen     10/21/2023    9:05 AM 08/14/2023   10:31 AM 07/27/2023   12:45 PM 07/23/2023   10:17 AM 07/22/2023    2:39 PM 07/15/2023    8:19 AM 04/09/2023    8:50 AM  PHQ 2/9 Scores  PHQ - 2 Score 1 0 1 1 0 1 0  PHQ- 9 Score  0 4 4   0    Fall Risk     10/21/2023    8:59 AM 07/23/2023   10:17 AM 07/22/2023    2:38 PM 04/09/2023    8:50 AM 01/08/2023    8:58 AM  Fall Risk   Falls in the past year? 1 0 0 1 0  Number falls in past yr: 1 0  Injury with Fall? 1 0     Risk for fall due to : Impaired balance/gait;Impaired mobility   History of fall(s)   Follow up Falls evaluation completed;Education provided  Falls evaluation completed;Falls prevention discussed Falls evaluation completed      MEDICARE RISK AT HOME:  Medicare Risk at Home Any stairs in or around the home?: Yes If so, are there any without handrails?: Yes Home free of loose throw rugs in walkways, pet beds, electrical cords, etc?: Yes Adequate lighting in your home to reduce risk of falls?: Yes Life alert?: No Use of a cane, walker or w/c?: No Grab bars in the bathroom?: No Shower chair or bench in shower?: Yes Elevated toilet seat or a handicapped toilet?: No  TIMED UP AND GO:  Was the test performed?  no  Cognitive Function: 6CIT completed        10/21/2023    9:10 AM 04/09/2022    2:03 PM  6CIT Screen  What Year? 0 points 0 points  What month? 3 points 0 points  What time? 0 points 0 points  Count back from 20 0 points 0 points  Months in reverse 0 points 0 points  Repeat phrase 0 points 0 points  Total Score 3 points 0 points    Immunizations Immunization History  Administered Date(s) Administered   Moderna Sars-Covid-2 Vaccination 09/16/2019, 10/28/2019   Pneumococcal Conjugate-13 09/11/2016   Pneumococcal Polysaccharide-23 08/02/2015   Tdap 09/12/2006, 04/12/2015    Screening Tests Health Maintenance  Topic Date Due   OPHTHALMOLOGY EXAM  Never done   Hepatitis C Screening  Never done   Zoster Vaccines- Shingrix (1 of 2) Never done   Diabetic kidney evaluation - Urine ACR  03/20/2023   FOOT EXAM  06/19/2023   Influenza Vaccine  Never done   Colonoscopy  01/08/2024 (Originally 09/16/2022)   Pneumococcal Vaccine: 50+ Years (3 of 3 - PCV20 or PCV21) 04/08/2024 (Originally 08/01/2020)   HEMOGLOBIN A1C  01/23/2024   Lung Cancer Screening  08/19/2024   Diabetic kidney evaluation - eGFR measurement  08/22/2024   Medicare Annual Wellness (AWV)  10/20/2024   DTaP/Tdap/Td (3 - Td or Tdap) 04/11/2025   HPV VACCINES  Aged Out   Meningococcal B Vaccine  Aged Out   COVID-19 Vaccine  Discontinued   Fecal DNA (Cologuard)  Discontinued    Health Maintenance Items Addressed: See Nurse  Notes at the end of this note  Additional Screening:  Vision Screening: Recommended annual ophthalmology exams for early detection of glaucoma and other disorders of the eye. Is the patient up to date with their annual eye exam?  No  Who is the provider or what is the name of the office in which the patient attends annual eye exams? [facility] in [facility]  Dental Screening: Recommended annual dental exams for proper oral hygiene  Community Resource Referral / Chronic Care Management: CRR required this visit?  No   CCM required this visit?  No   Plan:    I have personally reviewed and noted the following in the patient's chart:   Medical and social history Use of alcohol, tobacco or illicit drugs  Current medications and supplements including opioid prescriptions. Patient is currently taking opioid prescriptions. Information provided to patient regarding non-opioid alternatives. Patient advised to discuss non-opioid treatment plan with their provider. Functional ability and status Nutritional status Physical activity Advanced directives List of other physicians Hospitalizations, surgeries, and ER visits in previous 12 months Vitals Screenings to include cognitive, depression, and  falls Referrals and appointments  In addition, I have reviewed and discussed with patient certain preventive protocols, quality metrics, and best practice recommendations. A written personalized care plan for preventive services as well as general preventive health recommendations were provided to patient.   Ozie Ned, CMA   10/21/2023   After Visit Summary: (MyChart) Due to this being a telephonic visit, the after visit summary with patients personalized plan was offered to patient via MyChart   Notes: PCP Follow Up Recommendations: Pt is aware and due the following: UrineACR, Foot exam, Hep c, flu/shingles vaccine, Diabetic/Eye Exam

## 2023-10-21 NOTE — Patient Instructions (Signed)
 George Anthony,  Thank you for taking the time for your Medicare Wellness Visit. I appreciate your continued commitment to your health goals. Please review the care plan we discussed, and feel free to reach out if I can assist you further.  Medicare recommends these wellness visits once per year to help you and your care team stay ahead of potential health issues. These visits are designed to focus on prevention, allowing your provider to concentrate on managing your acute and chronic conditions during your regular appointments.  Please note that Annual Wellness Visits do not include a physical exam. Some assessments may be limited, especially if the visit was conducted virtually. If needed, we may recommend a separate in-person follow-up with your provider.  Ongoing Care Seeing your primary care provider every 3 to 6 months helps us  monitor your health and provide consistent, personalized care.   Referrals If a referral was made during today's visit and you haven't received any updates within two weeks, please contact the referred provider directly to check on the status.  Recommended Screenings:  Health Maintenance  Topic Date Due   Eye exam for diabetics  Never done   Hepatitis C Screening  Never done   Zoster (Shingles) Vaccine (1 of 2) Never done   Yearly kidney health urinalysis for diabetes  03/20/2023   Medicare Annual Wellness Visit  04/09/2023   Complete foot exam   06/19/2023   Flu Shot  Never done   Colon Cancer Screening  01/08/2024*   Pneumococcal Vaccine for age over 46 (3 of 3 - PCV20 or PCV21) 04/08/2024*   Hemoglobin A1C  01/23/2024   Screening for Lung Cancer  08/19/2024   Yearly kidney function blood test for diabetes  08/22/2024   DTaP/Tdap/Td vaccine (3 - Td or Tdap) 04/11/2025   HPV Vaccine  Aged Out   Meningitis B Vaccine  Aged Out   COVID-19 Vaccine  Discontinued   Cologuard (Stool DNA test)  Discontinued  *Topic was postponed. The date shown is not the original  due date.       10/21/2023    9:05 AM  Advanced Directives  Does Patient Have a Medical Advance Directive? No   Advance Care Planning is important because it: Ensures you receive medical care that aligns with your values, goals, and preferences. Provides guidance to your family and loved ones, reducing the emotional burden of decision-making during critical moments.  Vision: Annual vision screenings are recommended for early detection of glaucoma, cataracts, and diabetic retinopathy. These exams can also reveal signs of chronic conditions such as diabetes and high blood pressure.  Dental: Annual dental screenings help detect early signs of oral cancer, gum disease, and other conditions linked to overall health, including heart disease and diabetes.  Please see the attached documents for additional preventive care recommendations.

## 2023-10-22 ENCOUNTER — Other Ambulatory Visit: Payer: Self-pay

## 2023-10-22 ENCOUNTER — Other Ambulatory Visit (HOSPITAL_COMMUNITY): Payer: Self-pay

## 2023-10-22 NOTE — Progress Notes (Signed)
 Specialty Pharmacy Refill Coordination Note  Spoke with Kehoe,[name](Wife)  George Anthony is a 73 y.o. male contacted today regarding refills of specialty medication(s) Lenvatinib  Mesylate (LENVIMA )  Doses on hand: 4  Patient requested: Delivery   Delivery date: 10/23/23   Verified address: [address] [place] [address]  Medication will be filled on 10/22/23.

## 2023-10-23 ENCOUNTER — Ambulatory Visit: Admitting: Family Medicine

## 2023-10-23 ENCOUNTER — Other Ambulatory Visit: Payer: Self-pay

## 2023-10-23 ENCOUNTER — Ambulatory Visit: Payer: Self-pay | Admitting: Family Medicine

## 2023-10-23 ENCOUNTER — Encounter: Payer: Self-pay | Admitting: Family Medicine

## 2023-10-23 VITALS — BP 117/78 | HR 86 | Temp 97.9°F | Ht 67.0 in | Wt 198.4 lb

## 2023-10-23 DIAGNOSIS — E1169 Type 2 diabetes mellitus with other specified complication: Secondary | ICD-10-CM

## 2023-10-23 DIAGNOSIS — E11649 Type 2 diabetes mellitus with hypoglycemia without coma: Secondary | ICD-10-CM

## 2023-10-23 DIAGNOSIS — E039 Hypothyroidism, unspecified: Secondary | ICD-10-CM

## 2023-10-23 DIAGNOSIS — E1159 Type 2 diabetes mellitus with other circulatory complications: Secondary | ICD-10-CM

## 2023-10-23 DIAGNOSIS — E1121 Type 2 diabetes mellitus with diabetic nephropathy: Secondary | ICD-10-CM | POA: Diagnosis not present

## 2023-10-23 DIAGNOSIS — Z794 Long term (current) use of insulin: Secondary | ICD-10-CM

## 2023-10-23 DIAGNOSIS — I152 Hypertension secondary to endocrine disorders: Secondary | ICD-10-CM | POA: Diagnosis not present

## 2023-10-23 DIAGNOSIS — C641 Malignant neoplasm of right kidney, except renal pelvis: Secondary | ICD-10-CM | POA: Diagnosis not present

## 2023-10-23 DIAGNOSIS — E785 Hyperlipidemia, unspecified: Secondary | ICD-10-CM

## 2023-10-23 DIAGNOSIS — C9110 Chronic lymphocytic leukemia of B-cell type not having achieved remission: Secondary | ICD-10-CM | POA: Diagnosis not present

## 2023-10-23 LAB — BAYER DCA HB A1C WAIVED: HB A1C (BAYER DCA - WAIVED): 6.3 % — ABNORMAL HIGH (ref 4.8–5.6)

## 2023-10-23 NOTE — Patient Instructions (Signed)

## 2023-10-23 NOTE — Progress Notes (Signed)
 Subjective:  Patient ID: George Anthony, male    DOB: [birth year], 73 y.o.   MRN: [id number]  Patient Care Team: Severa Rock HERO, FNP as PCP - General (Family Medicine) Cindie Carlin POUR, DO as Consulting Physician (Internal Medicine) Ladora Ross Lacy Phebe, MD as Referring Physician (Optometry) Greissinger, April Macario, [place] (Hematology and Oncology)   Chief Complaint:  Medical Management of Chronic Issues   HPI: George Anthony is a 73 y.o. male presenting on 10/23/2023 for Medical Management of Chronic Issues   George Anthony is a 73 year old male who presents for management of chronic medical conditions. He has diabetes with associated hypertension, hyperlipidemia, neuropathy, and CKD.  He was hospitalized for a week following a fall in July. Since returning home, he reports that his legs are better and that the wounds are gone. A railing was installed at his home to prevent future falls.  There have been no changes in his medication regimen since his last visit. He continues to take gabapentin  for neuropathy, Eliquis  without any abnormal bleeding or bruising, thyroid  medication, Lasix  for blood pressure and swelling, and Lipitor for cholesterol. His diabetes is currently well-controlled with an A1c of 6.7. No increased hunger, thirst, or urination.  He has not experienced any changes in bowel habits and reports no trouble sleeping, except for occasional anxiety about appointments. He has not seen a podiatrist recently due to scheduling conflicts during a previous cancer-related hospitalization.          Relevant past medical, surgical, family, and social history reviewed and updated as indicated.  Allergies and medications reviewed and updated. Data reviewed: Chart in Epic.   Past Medical History:  Diagnosis Date   Anxiety    Diabetes mellitus without complication (HCC)    Diabetic nephropathy associated with type 2 diabetes mellitus (HCC) 02/21/2021   DVT (deep venous  thrombosis) (HCC)    Enlarged heart    Hyperlipidemia    Hypertension    Renal cell carcinoma [facility])     Past Surgical History:  Procedure Laterality Date   BIOPSY  09/16/2019   Procedure: BIOPSY;  Surgeon: Cindie Carlin POUR, DO;  [facility];  Service: Endoscopy;;   COLONOSCOPY WITH PROPOFOL  N/A 09/16/2019   Procedure: COLONOSCOPY WITH PROPOFOL ;  Surgeon: Cindie Carlin POUR, DO;  [facility];  Service: Endoscopy;  Laterality: N/A;  7:30am   POLYPECTOMY  09/16/2019   Procedure: POLYPECTOMY;  Surgeon: Cindie Carlin POUR, DO;  [facility];  Service: Endoscopy;;    Social History   Socioeconomic History   Marital status: Married    Spouse name: George Anthony   Number of children: 1   Years of education: Not on file   Highest education level: Not on file  Occupational History    Employer: [employer]   Occupation: Retired  Tobacco Use   Smoking status: Every Day    Current packs/day: 1.00    Average packs/day: 1 pack/day for 40.0 years (40.0 ttl pk-yrs)    Types: Cigarettes   Smokeless tobacco: Never  Vaping Use   Vaping status: Never Used  Substance and Sexual Activity   Alcohol use: Yes    Alcohol/week: 3.0 standard drinks of alcohol    Types: 3 Cans of beer per week    Comment: drinking a couple times a month for the last year.- used to drink 2 12 packs a week   Drug use: No   Sexual activity: Yes  Other Topics Concern   Not on file  Social History Narrative   Not on file   Social Drivers of Health   Financial Resource Strain: Low Risk  (10/21/2023)   Overall Financial Resource Strain (CARDIA)    Difficulty of Paying Living Expenses: Not hard at all  Food Insecurity: No Food Insecurity (10/21/2023)   Hunger Vital Sign    Worried About Running Out of Food in the Last Year: Never true    Ran Out of Food in the Last Year: Never true  Transportation Needs: No Transportation Needs (10/21/2023)   PRAPARE - Scientist, research (physical sciences) (Medical): No    Lack of Transportation (Non-Medical): No  Physical Activity: Inactive (10/21/2023)   Exercise Vital Sign    Days of Exercise per Week: 0 days    Minutes of Exercise per Session: 0 min  Stress: No Stress Concern Present (10/21/2023)   [employer] of Occupational Health - Occupational Stress Questionnaire    Feeling of Stress: Not at all  Social Connections: Moderately Isolated (10/21/2023)   Social Connection and Isolation Panel    Frequency of Communication with Friends and Family: More than three times a week    Frequency of Social Gatherings with Friends and Family: More than three times a week    Attends Religious Services: Never    Database administrator or Organizations: No    Attends Banker Meetings: Never    Marital Status: Married  Catering manager Violence: Not At Risk (10/21/2023)   Humiliation, Afraid, Rape, and Kick questionnaire    Fear of Current or Ex-Partner: No    Emotionally Abused: No    Physically Abused: No    Sexually Abused: No    Outpatient Encounter Medications as of 10/23/2023  Medication Sig   acetaminophen  (TYLENOL ) 500 MG tablet Take 1,300 mg by mouth every 6 (six) hours as needed for moderate pain. 650 mg tab   atorvastatin  (LIPITOR) 40 MG tablet TAKE 1 TABLET BY MOUTH AT BEDTIME   clobetasol  cream (TEMOVATE ) 0.05 % APPLY TOPICALLY TWICE DAILY   ELIQUIS  5 MG TABS tablet Take 1 tablet by mouth twice daily   escitalopram  (LEXAPRO ) 10 MG tablet Take 1 tablet by mouth once daily   furosemide  (LASIX ) 20 MG tablet Take 2 tablets by mouth once daily   gabapentin  (NEURONTIN ) 100 MG capsule Take 1 capsule by mouth three times daily as needed   lenvatinib  10 mg daily dose (LENVIMA , 10 MG DAILY DOSE,) capsule Take 1 capsule (10 mg total) by mouth daily.   levothyroxine  (SYNTHROID ) 50 MCG tablet Take 2 tablets (100 mcg total) by mouth daily before breakfast.   nystatin  (MYCOSTATIN /NYSTOP ) powder Apply 1 Application  topically 2 (two) times daily.   oxyCODONE  (OXY IR/ROXICODONE ) 5 MG immediate release tablet Take 1-2 tablets (5-10 mg total) by mouth every 4 (four) hours as needed for moderate pain (pain score 4-6), severe pain (pain score 7-10) or breakthrough pain.   prochlorperazine  (COMPAZINE ) 10 MG tablet Take 10 mg by mouth every 6 (six) hours as needed.   silver  sulfADIAZINE  (SILVADENE ) 1 % cream Apply 1 Application topically daily.   terbinafine  (LAMISIL ) 1 % cream Apply 1 Application topically 2 (two) times daily.   [DISCONTINUED] benzonatate  (TESSALON  PERLES) 100 MG capsule Take 2 capsules (200 mg total) by mouth 3 (three) times daily as needed. (Patient not taking: Reported on 10/21/2023)   No facility-administered encounter medications on file as of 10/23/2023.    No  Known Allergies  Pertinent ROS per HPI, otherwise unremarkable      Objective:  BP 117/78   Pulse 86   Temp 97.9 F (36.6 C) (Temporal)   Ht 5' 7 (1.702 m)   Wt 198 lb 6.4 oz (90 kg)   SpO2 95%   BMI 31.07 kg/m    Wt Readings from Last 3 Encounters:  10/23/23 198 lb 6.4 oz (90 kg)  10/21/23 206 lb (93.4 kg)  08/20/23 203 lb 4.2 oz (92.2 kg)    Physical Exam Vitals and nursing note reviewed.  Constitutional:      General: He is not in acute distress.    Appearance: He is morbidly obese. He is ill-appearing (chronically ill). He is not toxic-appearing or diaphoretic.  HENT:     Head: Normocephalic and atraumatic.     Nose: Nose normal.     Mouth/Throat:     Mouth: Mucous membranes are moist.  Eyes:     Conjunctiva/sclera: Conjunctivae normal.     Pupils: Pupils are equal, round, and reactive to light.  Cardiovascular:     Rate and Rhythm: Normal rate and regular rhythm.     Heart sounds: Normal heart sounds.  Pulmonary:     Effort: Pulmonary effort is normal.     Breath sounds: Rhonchi present.  Abdominal:     General: Bowel sounds are normal. There is distension.  Musculoskeletal:     Right lower leg:  Edema present.     Left lower leg: Edema present.  Skin:    General: Skin is warm and dry.     Capillary Refill: Capillary refill takes less than 2 seconds.     Comments: Wounds to BLE have healed. Does have significant xerosis to BLE. Continues to have ruddy discoloration to BLE.   Neurological:     General: No focal deficit present.     Mental Status: He is alert and oriented to person, place, and time.  Psychiatric:        Mood and Affect: Mood normal.        Behavior: Behavior normal.        Thought Content: Thought content normal.        Judgment: Judgment normal.     Results for orders placed or performed during the hospital encounter of 08/20/23  Comprehensive metabolic panel   Collection Time: 08/20/23  1:15 PM  Result Value Ref Range   Sodium 137 135 - 145 mmol/L   Potassium 3.8 3.5 - 5.1 mmol/L   Chloride 98 98 - 111 mmol/L   CO2 29 22 - 32 mmol/L   Glucose, Bld 131 (H) 70 - 99 mg/dL   BUN 15 8 - 23 mg/dL   Creatinine, Ser 8.83 0.61 - 1.24 mg/dL   Calcium  8.6 (L) 8.9 - 10.3 mg/dL   Total Protein 5.9 (L) 6.5 - 8.1 g/dL   Albumin 3.2 (L) 3.5 - 5.0 g/dL   AST 18 15 - 41 U/L   ALT 14 0 - 44 U/L   Alkaline Phosphatase 86 38 - 126 U/L   Total Bilirubin 0.7 0.0 - 1.2 mg/dL   GFR, Estimated >39 >39 mL/min   Anion gap 10 5 - 15  CBC   Collection Time: 08/20/23  1:15 PM  Result Value Ref Range   WBC 29.8 (H) 4.0 - 10.5 K/uL   RBC 3.87 (L) 4.22 - 5.81 MIL/uL   Hemoglobin 13.1 13.0 - 17.0 g/dL   HCT 59.1 60.9 - 47.9 %  MCV 105.4 (H) 80.0 - 100.0 fL   MCH 33.9 26.0 - 34.0 pg   MCHC 32.1 30.0 - 36.0 g/dL   RDW 81.9 (H) 88.4 - 84.4 %   Platelets 241 150 - 400 K/uL   nRBC 0.1 0.0 - 0.2 %  Protime-INR   Collection Time: 08/20/23  1:15 PM  Result Value Ref Range   Prothrombin Time 13.8 11.4 - 15.2 seconds   INR 1.0 0.8 - 1.2  CBG monitoring, ED   Collection Time: 08/20/23  1:46 PM  Result Value Ref Range   Glucose-Capillary 139 (H) 70 - 99 mg/dL  Urinalysis,  Routine w reflex microscopic -Urine, Clean Catch   Collection Time: 08/20/23  4:04 PM  Result Value Ref Range   Color, Urine YELLOW YELLOW   APPearance CLEAR CLEAR   Specific Gravity, Urine 1.019 1.005 - 1.030   pH 6.0 5.0 - 8.0   Glucose, UA NEGATIVE NEGATIVE mg/dL   Hgb urine dipstick NEGATIVE NEGATIVE   Bilirubin Urine NEGATIVE NEGATIVE   Ketones, ur NEGATIVE NEGATIVE mg/dL   Protein, ur 899 (A) NEGATIVE mg/dL   Nitrite NEGATIVE NEGATIVE   Leukocytes,Ua NEGATIVE NEGATIVE   RBC / HPF 0-5 0 - 5 RBC/hpf   WBC, UA 0-5 0 - 5 WBC/hpf   Bacteria, UA NONE SEEN NONE SEEN   Squamous Epithelial / HPF 0-5 0 - 5 /HPF  Lactic acid, plasma   Collection Time: 08/20/23  4:22 PM  Result Value Ref Range   Lactic Acid, Venous 1.0 0.5 - 1.9 mmol/L  Sample to Blood Bank   Collection Time: 08/20/23  4:22 PM  Result Value Ref Range   Blood Bank Specimen SAMPLE AVAILABLE FOR TESTING    Sample Expiration      08/23/2023,2359 Performed at [facility], [address], [place], [place] [address]   Basic metabolic panel   Collection Time: 08/21/23  4:43 AM  Result Value Ref Range   Sodium 140 135 - 145 mmol/L   Potassium 3.5 3.5 - 5.1 mmol/L   Chloride 96 (L) 98 - 111 mmol/L   CO2 33 (H) 22 - 32 mmol/L   Glucose, Bld 106 (H) 70 - 99 mg/dL   BUN 15 8 - 23 mg/dL   Creatinine, Ser 8.78 0.61 - 1.24 mg/dL   Calcium  8.4 (L) 8.9 - 10.3 mg/dL   GFR, Estimated >39 >39 mL/min   Anion gap 11 5 - 15  CBC   Collection Time: 08/21/23  4:43 AM  Result Value Ref Range   WBC 24.7 (H) 4.0 - 10.5 K/uL   RBC 3.62 (L) 4.22 - 5.81 MIL/uL   Hemoglobin 12.3 (L) 13.0 - 17.0 g/dL   HCT 61.0 (L) 60.9 - 47.9 %   MCV 107.5 (H) 80.0 - 100.0 fL   MCH 34.0 26.0 - 34.0 pg   MCHC 31.6 30.0 - 36.0 g/dL   RDW 81.9 (H) 88.4 - 84.4 %   Platelets 214 150 - 400 K/uL   nRBC 0.0 0.0 - 0.2 %  Glucose, capillary   Collection Time: 08/21/23  7:22 AM  Result Value Ref Range   Glucose-Capillary 121 (H) 70 - 99 mg/dL   Comment 1  Notify RN    Comment 2 Document in Chart   Glucose, capillary   Collection Time: 08/21/23 11:05 AM  Result Value Ref Range   Glucose-Capillary 178 (H) 70 - 99 mg/dL   Comment 1 Notify RN    Comment 2 Document in Chart   Glucose,  capillary   Collection Time: 08/21/23  4:35 PM  Result Value Ref Range   Glucose-Capillary 141 (H) 70 - 99 mg/dL   Comment 1 Notify RN    Comment 2 Document in Chart   Glucose, capillary   Collection Time: 08/21/23  9:11 PM  Result Value Ref Range   Glucose-Capillary 120 (H) 70 - 99 mg/dL   Comment 1 Notify RN    Comment 2 Document in Chart   Basic metabolic panel   Collection Time: 08/22/23  4:59 AM  Result Value Ref Range   Sodium 139 135 - 145 mmol/L   Potassium 3.5 3.5 - 5.1 mmol/L   Chloride 93 (L) 98 - 111 mmol/L   CO2 33 (H) 22 - 32 mmol/L   Glucose, Bld 121 (H) 70 - 99 mg/dL   BUN 14 8 - 23 mg/dL   Creatinine, Ser 8.91 0.61 - 1.24 mg/dL   Calcium  8.5 (L) 8.9 - 10.3 mg/dL   GFR, Estimated >39 >39 mL/min   Anion gap 13 5 - 15  CBC   Collection Time: 08/22/23  4:59 AM  Result Value Ref Range   WBC 23.1 (H) 4.0 - 10.5 K/uL   RBC 3.79 (L) 4.22 - 5.81 MIL/uL   Hemoglobin 12.8 (L) 13.0 - 17.0 g/dL   HCT 59.6 60.9 - 47.9 %   MCV 106.3 (H) 80.0 - 100.0 fL   MCH 33.8 26.0 - 34.0 pg   MCHC 31.8 30.0 - 36.0 g/dL   RDW 82.4 (H) 88.4 - 84.4 %   Platelets 204 150 - 400 K/uL   nRBC 0.0 0.0 - 0.2 %  Glucose, capillary   Collection Time: 08/22/23  7:14 AM  Result Value Ref Range   Glucose-Capillary 125 (H) 70 - 99 mg/dL  Glucose, capillary   Collection Time: 08/22/23 11:16 AM  Result Value Ref Range   Glucose-Capillary 146 (H) 70 - 99 mg/dL  Glucose, capillary   Collection Time: 08/22/23  4:17 PM  Result Value Ref Range   Glucose-Capillary 147 (H) 70 - 99 mg/dL  Glucose, capillary   Collection Time: 08/22/23  9:29 PM  Result Value Ref Range   Glucose-Capillary 125 (H) 70 - 99 mg/dL   Comment 1 Notify RN    Comment 2 Document in Chart    Glucose, capillary   Collection Time: 08/23/23  4:55 AM  Result Value Ref Range   Glucose-Capillary 132 (H) 70 - 99 mg/dL  Basic metabolic panel   Collection Time: 08/23/23  4:56 AM  Result Value Ref Range   Sodium 136 135 - 145 mmol/L   Potassium 3.4 (L) 3.5 - 5.1 mmol/L   Chloride 91 (L) 98 - 111 mmol/L   CO2 33 (H) 22 - 32 mmol/L   Glucose, Bld 127 (H) 70 - 99 mg/dL   BUN 13 8 - 23 mg/dL   Creatinine, Ser 9.00 0.61 - 1.24 mg/dL   Calcium  8.6 (L) 8.9 - 10.3 mg/dL   GFR, Estimated >39 >39 mL/min   Anion gap 12 5 - 15  CBC   Collection Time: 08/23/23  4:56 AM  Result Value Ref Range   WBC 27.0 (H) 4.0 - 10.5 K/uL   RBC 3.88 (L) 4.22 - 5.81 MIL/uL   Hemoglobin 13.1 13.0 - 17.0 g/dL   HCT 58.7 60.9 - 47.9 %   MCV 106.2 (H) 80.0 - 100.0 fL   MCH 33.8 26.0 - 34.0 pg   MCHC 31.8 30.0 - 36.0 g/dL   RDW  16.9 (H) 11.5 - 15.5 %   Platelets 226 150 - 400 K/uL   nRBC 0.0 0.0 - 0.2 %  Glucose, capillary   Collection Time: 08/23/23  7:13 AM  Result Value Ref Range   Glucose-Capillary 124 (H) 70 - 99 mg/dL  Glucose, capillary   Collection Time: 08/23/23 11:14 AM  Result Value Ref Range   Glucose-Capillary 133 (H) 70 - 99 mg/dL       Pertinent labs & imaging results that were available during my care of the patient were reviewed by me and considered in my medical decision making.  Assessment & Plan:  Aidian was seen today for medical management of chronic issues.  Diagnoses and all orders for this visit:  Type 2 diabetes mellitus with hypoglycemia without coma, with long-term current use of insulin  (HCC) -     Bayer DCA Hb A1c Waived -     Microalbumin / creatinine urine ratio -     TSH -     T4, Free -     CBC with Differential/Platelet -     CMP14+EGFR  Acquired hypothyroidism -     TSH -     T4, Free  Renal cell carcinoma of right kidney (HCC) -     Bayer DCA Hb A1c Waived -     Microalbumin / creatinine urine ratio -     CBC with Differential/Platelet -      CMP14+EGFR  Hypertension associated with type 2 diabetes mellitus (HCC) -     Bayer DCA Hb A1c Waived -     Microalbumin / creatinine urine ratio -     TSH -     T4, Free -     CBC with Differential/Platelet -     CMP14+EGFR  Hyperlipidemia associated with type 2 diabetes mellitus (HCC) -     Bayer DCA Hb A1c Waived -     CMP14+EGFR  Diabetic nephropathy associated with type 2 diabetes mellitus (HCC) -     Bayer DCA Hb A1c Waived -     TSH -     T4, Free -     CBC with Differential/Platelet -     CMP14+EGFR  CLL (chronic lymphocytic leukemia) (HCC) -     CBC with Differential/Platelet -     CMP14+EGFR     Malignancy Active malignancy. He has not returned to the cancer center since his fall. He expressed uncertainty about continuing infusions but decided to proceed after discussing the potential for extended survival with treatment. - Call and schedule an appointment with the cancer center to continue infusions. - Inform the cancer center of his decision to continue infusions.  Type 2 diabetes mellitus with diabetic neuropathy Type 2 diabetes is well-controlled with an A1c of 6.7. He manages diabetes through diet and takes gabapentin  for diabetic neuropathy. Reports no increased hunger, thirst, or urination.  Hypertension Blood pressure is well-controlled with Lasix , which also aids in managing edema.  Edema of lower extremities, resolved wounds Previous edema and wounds on lower extremities have resolved. He uses topical treatments for dry skin and elevates his feet with a wedge pillow to prevent recurrence.  Hypothyroidism On thyroid  medication with no changes in symptoms or bowel habits, indicating well-managed hypothyroidism.  Hyperlipidemia Takes Lipitor for hyperlipidemia with no new issues reported, indicating well-managed condition.          Continue all other maintenance medications.  Follow up plan: Return in 3 months (on 01/23/2024), or if symptoms worsen  or  fail to improve, for DM.   Continue healthy lifestyle choices, including diet (rich in fruits, vegetables, and lean proteins, and low in salt and simple carbohydrates) and exercise (at least 30 minutes of moderate physical activity daily).  Educational handout given for DM  The above assessment and management plan was discussed with the patient. The patient verbalized understanding of and has agreed to the management plan. Patient is aware to call the clinic if they develop any new symptoms or if symptoms persist or worsen. Patient is aware when to return to the clinic for a follow-up visit. Patient educated on when it is appropriate to go to the emergency department.   Rosaline Bruns, FNP-C [facility] [phone]

## 2023-10-24 ENCOUNTER — Encounter: Payer: Self-pay | Admitting: Oncology

## 2023-10-24 LAB — CMP14+EGFR
ALT: 8 IU/L (ref 0–44)
AST: 9 IU/L (ref 0–40)
Albumin: 4 g/dL (ref 3.8–4.8)
Alkaline Phosphatase: 115 IU/L (ref 47–123)
BUN/Creatinine Ratio: 10 (ref 10–24)
BUN: 13 mg/dL (ref 8–27)
Bilirubin Total: 0.5 mg/dL (ref 0.0–1.2)
CO2: 30 mmol/L — ABNORMAL HIGH (ref 20–29)
Calcium: 8.5 mg/dL — ABNORMAL LOW (ref 8.6–10.2)
Chloride: 94 mmol/L — ABNORMAL LOW (ref 96–106)
Creatinine, Ser: 1.33 mg/dL — ABNORMAL HIGH (ref 0.76–1.27)
Globulin, Total: 2.1 g/dL (ref 1.5–4.5)
Glucose: 127 mg/dL — ABNORMAL HIGH (ref 70–99)
Potassium: 3.7 mmol/L (ref 3.5–5.2)
Sodium: 137 mmol/L (ref 134–144)
Total Protein: 6.1 g/dL (ref 6.0–8.5)
eGFR: 57 mL/min/1.73 — ABNORMAL LOW (ref >59)

## 2023-10-24 LAB — CBC WITH DIFFERENTIAL/PLATELET
Basophils Absolute: 0.1 x10E3/uL (ref 0.0–0.2)
Basos: 0 %
EOS (ABSOLUTE): 0.2 x10E3/uL (ref 0.0–0.4)
Eos: 1 %
Hematocrit: 45.2 % (ref 37.5–51.0)
Hemoglobin: 14.6 g/dL (ref 13.0–17.7)
Immature Grans (Abs): 0.1 x10E3/uL (ref 0.0–0.1)
Immature Granulocytes: 1 %
Lymphocytes Absolute: 10.4 x10E3/uL — ABNORMAL HIGH (ref 0.7–3.1)
Lymphs: 59 %
MCH: 33.3 pg — ABNORMAL HIGH (ref 26.6–33.0)
MCHC: 32.3 g/dL (ref 31.5–35.7)
MCV: 103 fL — ABNORMAL HIGH (ref 79–97)
Monocytes Absolute: 0.8 x10E3/uL (ref 0.1–0.9)
Monocytes: 5 %
Neutrophils Absolute: 6 x10E3/uL (ref 1.4–7.0)
Neutrophils: 34 %
Platelets: 150 x10E3/uL (ref 150–450)
RBC: 4.39 x10E6/uL (ref 4.14–5.80)
RDW: 12.3 % (ref 11.6–15.4)
WBC: 17.6 x10E3/uL — ABNORMAL HIGH (ref 3.4–10.8)

## 2023-10-24 LAB — T4, FREE: Free T4: 1.27 ng/dL (ref 0.82–1.77)

## 2023-10-24 LAB — TSH: TSH: 27.1 u[IU]/mL — ABNORMAL HIGH (ref 0.450–4.500)

## 2023-10-25 LAB — MICROALBUMIN / CREATININE URINE RATIO
Creatinine, Urine: 243.4 mg/dL
Microalb/Creat Ratio: 74 mg/g{creat} — AB (ref 0–29)
Microalbumin, Urine: 179.9 ug/mL

## 2023-10-27 ENCOUNTER — Other Ambulatory Visit: Payer: Self-pay | Admitting: *Deleted

## 2023-10-27 ENCOUNTER — Other Ambulatory Visit: Payer: Self-pay

## 2023-10-27 MED ORDER — FUROSEMIDE 20 MG PO TABS: 40.0000 mg | ORAL_TABLET | Freq: Every day | ORAL | 0 refills | Status: DC

## 2023-10-27 MED ORDER — LEVOTHYROXINE SODIUM 88 MCG PO TABS: 88.0000 ug | ORAL_TABLET | Freq: Every day | ORAL | 3 refills | Status: DC

## 2023-10-28 ENCOUNTER — Other Ambulatory Visit: Payer: Self-pay

## 2023-10-30 ENCOUNTER — Other Ambulatory Visit: Payer: Self-pay

## 2023-10-30 DIAGNOSIS — C641 Malignant neoplasm of right kidney, except renal pelvis: Secondary | ICD-10-CM

## 2023-10-30 DIAGNOSIS — Z79899 Other long term (current) drug therapy: Secondary | ICD-10-CM

## 2023-10-30 NOTE — Telephone Encounter (Signed)
 Refill request for oxycodone  sent to Dr. Davonna and patient wife made aware.

## 2023-11-02 ENCOUNTER — Encounter: Payer: Self-pay | Admitting: Oncology

## 2023-11-02 MED ORDER — OXYCODONE HCL 5 MG PO TABS: 5.0000 mg | ORAL_TABLET | ORAL | 0 refills | Status: DC | PRN

## 2023-11-03 ENCOUNTER — Other Ambulatory Visit: Payer: Self-pay

## 2023-11-04 ENCOUNTER — Inpatient Hospital Stay: Admitting: Oncology

## 2023-11-04 ENCOUNTER — Inpatient Hospital Stay: Attending: Oncology

## 2023-11-04 VITALS — HR 88 | Temp 99.0°F | Resp 18 | Ht 63.81 in | Wt 204.0 lb

## 2023-11-04 DIAGNOSIS — C641 Malignant neoplasm of right kidney, except renal pelvis: Secondary | ICD-10-CM | POA: Diagnosis not present

## 2023-11-04 DIAGNOSIS — L03115 Cellulitis of right lower limb: Secondary | ICD-10-CM | POA: Insufficient documentation

## 2023-11-04 DIAGNOSIS — Z720 Tobacco use: Secondary | ICD-10-CM | POA: Diagnosis not present

## 2023-11-04 DIAGNOSIS — L139 Bullous disorder, unspecified: Secondary | ICD-10-CM | POA: Diagnosis not present

## 2023-11-04 DIAGNOSIS — F17210 Nicotine dependence, cigarettes, uncomplicated: Secondary | ICD-10-CM | POA: Insufficient documentation

## 2023-11-04 DIAGNOSIS — C9111 Chronic lymphocytic leukemia of B-cell type in remission: Secondary | ICD-10-CM | POA: Insufficient documentation

## 2023-11-04 DIAGNOSIS — C787 Secondary malignant neoplasm of liver and intrahepatic bile duct: Secondary | ICD-10-CM | POA: Insufficient documentation

## 2023-11-04 DIAGNOSIS — Z7984 Long term (current) use of oral hypoglycemic drugs: Secondary | ICD-10-CM | POA: Diagnosis not present

## 2023-11-04 DIAGNOSIS — Z79890 Hormone replacement therapy (postmenopausal): Secondary | ICD-10-CM | POA: Insufficient documentation

## 2023-11-04 DIAGNOSIS — C9110 Chronic lymphocytic leukemia of B-cell type not having achieved remission: Secondary | ICD-10-CM

## 2023-11-04 DIAGNOSIS — Z905 Acquired absence of kidney: Secondary | ICD-10-CM | POA: Insufficient documentation

## 2023-11-04 DIAGNOSIS — Z79899 Other long term (current) drug therapy: Secondary | ICD-10-CM | POA: Diagnosis not present

## 2023-11-04 DIAGNOSIS — E039 Hypothyroidism, unspecified: Secondary | ICD-10-CM | POA: Insufficient documentation

## 2023-11-04 DIAGNOSIS — Z86718 Personal history of other venous thrombosis and embolism: Secondary | ICD-10-CM | POA: Diagnosis not present

## 2023-11-04 DIAGNOSIS — L304 Erythema intertrigo: Secondary | ICD-10-CM | POA: Insufficient documentation

## 2023-11-04 DIAGNOSIS — L03116 Cellulitis of left lower limb: Secondary | ICD-10-CM | POA: Diagnosis not present

## 2023-11-04 DIAGNOSIS — L309 Dermatitis, unspecified: Secondary | ICD-10-CM

## 2023-11-04 DIAGNOSIS — C7800 Secondary malignant neoplasm of unspecified lung: Secondary | ICD-10-CM | POA: Diagnosis not present

## 2023-11-04 LAB — COMPREHENSIVE METABOLIC PANEL WITH GFR
ALT: 7 U/L (ref 0–44)
AST: 16 U/L (ref 15–41)
Albumin: 4 g/dL (ref 3.5–5.0)
Alkaline Phosphatase: 103 U/L (ref 38–126)
Anion gap: 10 (ref 5–15)
BUN: 14 mg/dL (ref 8–23)
CO2: 29 mmol/L (ref 22–32)
Calcium: 9 mg/dL (ref 8.9–10.3)
Chloride: 99 mmol/L (ref 98–111)
Creatinine, Ser: 1.42 mg/dL — ABNORMAL HIGH (ref 0.61–1.24)
GFR, Estimated: 53 mL/min — ABNORMAL LOW (ref >60)
Glucose, Bld: 114 mg/dL — ABNORMAL HIGH (ref 70–99)
Potassium: 4.2 mmol/L (ref 3.5–5.1)
Sodium: 139 mmol/L (ref 135–145)
Total Bilirubin: 0.5 mg/dL (ref 0.0–1.2)
Total Protein: 6.5 g/dL (ref 6.5–8.1)

## 2023-11-04 LAB — CBC WITH DIFFERENTIAL/PLATELET
Abs Immature Granulocytes: 0.07 K/uL (ref 0.00–0.07)
Basophils Absolute: 0 K/uL (ref 0.0–0.1)
Basophils Relative: 0 %
Eosinophils Absolute: 0.2 K/uL (ref 0.0–0.5)
Eosinophils Relative: 1 %
HCT: 45.6 % (ref 39.0–52.0)
Hemoglobin: 14.9 g/dL (ref 13.0–17.0)
Immature Granulocytes: 0 %
Lymphocytes Relative: 61 %
Lymphs Abs: 11.3 K/uL — ABNORMAL HIGH (ref 0.7–4.0)
MCH: 33 pg (ref 26.0–34.0)
MCHC: 32.7 g/dL (ref 30.0–36.0)
MCV: 101.1 fL — ABNORMAL HIGH (ref 80.0–100.0)
Monocytes Absolute: 0.7 K/uL (ref 0.1–1.0)
Monocytes Relative: 4 %
Neutro Abs: 6.2 K/uL (ref 1.7–7.7)
Neutrophils Relative %: 34 %
Platelets: 168 K/uL (ref 150–400)
RBC: 4.51 MIL/uL (ref 4.22–5.81)
RDW: 13.3 % (ref 11.5–15.5)
Smear Review: NORMAL
WBC: 18.5 K/uL — ABNORMAL HIGH (ref 4.0–10.5)
nRBC: 0 % (ref 0.0–0.2)

## 2023-11-04 LAB — MAGNESIUM: Magnesium: 2 mg/dL (ref 1.7–2.4)

## 2023-11-04 LAB — TSH: TSH: 25.6 u[IU]/mL — ABNORMAL HIGH (ref 0.350–4.500)

## 2023-11-04 MED ORDER — LEVOTHYROXINE SODIUM 100 MCG PO TABS: 100.0000 ug | ORAL_TABLET | Freq: Every day | ORAL | 2 refills | Status: DC

## 2023-11-04 NOTE — Patient Instructions (Addendum)
 [facility] at [facility] Discharge Instructions   You were seen and examined today by Dr. Davonna.  She reviewed the results of your lab work which are normal/stable.   We will repeat a CT scan prior to your next visit.   Return as scheduled.    Thank you for choosing [facility] at [facility] to provide your oncology and hematology care.  To afford each patient quality time with our provider, please arrive at least 15 minutes before your scheduled appointment time.   If you have a lab appointment with the Cancer Center please come in thru the Main Entrance and check in at the main information desk.  You need to re-schedule your appointment should you arrive 10 or more minutes late.  We strive to give you quality time with our providers, and arriving late affects you and other patients whose appointments are after yours.  Also, if you no show three or more times for appointments you may be dismissed from the clinic at the providers discretion.     Again, thank you for choosing [facility].  Our hope is that these requests will decrease the amount of time that you wait before being seen by our physicians.       _____________________________________________________________  Should you have questions after your visit to [facility], please contact our office at [phone] and follow the prompts.  Our office hours are 8:00 a.m. and 4:30 p.m. Monday - Friday.  Please note that voicemails left after 4:00 p.m. may not be returned until the following business day.  We are closed weekends and major holidays.  You do have access to a nurse 24-7, just call the main number to the clinic [phone] and do not press any options, hold on the line and a nurse will answer the phone.    For prescription refill requests, have your pharmacy contact our office and allow 72 hours.    Due to Covid, you will need to wear a mask upon  entering the hospital. If you do not have a mask, a mask will be given to you at the Main Entrance upon arrival. For doctor visits, patients may have 1 support person age 77 or older with them. For treatment visits, patients can not have anyone with them due to social distancing guidelines and our immunocompromised population.

## 2023-11-04 NOTE — Progress Notes (Addendum)
 Patient Care Team: Severa Rock HERO, FNP as PCP - General (Family Medicine) Cindie Carlin POUR, DO as Consulting Physician (Internal Medicine) Ladora Ross Lacy Phebe, MD as Referring Physician (Optometry) Greissinger, April Macario RIGGERS (Hematology and Oncology)  Clinic Day:  11/04/2023  Referring physician: Severa Rock HERO, FNP   CHIEF COMPLAINT:  CC: Renal cell carcinoma    ASSESSMENT & PLAN:   Assessment & Plan: George Anthony  is a 73 y.o. male with metastatic right-sided renal cell carcinoma   Renal cell carcinoma of right kidney Metastatic renal cell carcinoma with biopsy-proven liver metastasis and lung metastasis. Oncology history as below Started on pembrolizumab  every 6 weeks and axitinib  twice daily in May 2024.  Axitinib  was started later in July 2024 due to problems with access. Held Axitinib  for malignant intertrigo in 06/25 Last imaging was in March 2025 with no evidence of progression Recent CT 06/25 with stable disease Pembrolizumab  was held for bilateral leg cellulitis.    -Cellulitis significantly improved at this time.  Unsure if this is from immunotherapy. - Continue lenvatinib  10 mg daily last week.  Will increase the dose if patient tolerates well. -Will continue to hold pembrolizumab  at this time. - Will repeat a CT scan prior to next visit and if there is progression of disease, we will consider increasing lenvatinib  and reducing pembrolizumab . - Repeat CT prior to next visit  Return to clinic in 3 weeks for follow-up and to assess treatment response  Dermatitis Bilateral lower leg bullous dermatitis.  Likely immunotherapy induced Previously treated for cellulitis and getting worse even after completing the course of antibiotics Evaluated by dermatology and was told not immunotherapy induced   - Significantly improved today -Will continue to hold pembrolizumab   -Continue high-dose corticosteroid cream to be applied over the affected areas as  needed  CLL Patient had incidental diagnosis of CLL.  RAI stage 0. Asymptomatic. More than 50% increase in lymphocytes at this time     - If lymphocytes continue to trend up and patient develops symptoms, can consider starting on treatment.  Intertrigo Patient likely has malignant intertrigo secondary to axitinib  use. Significantly improved at this time Completed oral fluconazole  course   - Discontinued axitinib    Resolved at this time  Tobacco use Continued tobacco use, a risk factor for kidney cancer.    - Advised to quit smoking to reduce health risks.  Acquired hypothyroidism Likely secondary to immunotherapy TSH and T4 improved with levothyroxine  increase    - Patient reports taking only 88 mcg daily.  Will send a new prescription for Levothyroxine  100 mcg daily - Will monitor levels every 6 weeks  Chronic pain Resolved at this time.  Recommended patient not to take opioids or gabapentin  if not needed.  Orders Placed This Encounter  Procedures   CT CHEST ABDOMEN PELVIS W CONTRAST    Standing Status:   Future    Expected Date:   12/05/2023    Expiration Date:   11/03/2024    If indicated for the ordered procedure, I authorize the administration of contrast media per Radiology protocol:   Yes    Does the patient have a contrast media/X-ray dye allergy?:   No    Preferred imaging location?:   [facility]    If indicated for the ordered procedure, I authorize the administration of oral contrast media per Radiology protocol:   Yes     The patient understands the plans discussed today and is in agreement with them.  He  knows to contact our office if he develops concerns prior to his next appointment.  The total time spent in the appointment was 40 minutes for the encounter with patient, including review of chart and various tests results, discussions about plan of care and coordination of care plan   Mickiel Dry, MD  [facility]  [facility] [place] - A DEPT OF [facility] [address] [place] [address] Dept: [phone] Dept Fax: [phone]   Orders Placed This Encounter  Procedures   CT CHEST ABDOMEN PELVIS W CONTRAST    Standing Status:   Future    Expected Date:   12/05/2023    Expiration Date:   11/03/2024    If indicated for the ordered procedure, I authorize the administration of contrast media per Radiology protocol:   Yes    Does the patient have a contrast media/X-ray dye allergy?:   No    Preferred imaging location?:   [facility]    If indicated for the ordered procedure, I authorize the administration of oral contrast media per Radiology protocol:   Yes     ONCOLOGY HISTORY:   Diagnosis: Metastatic renal cell carcinoma:  -08/14/2020: 9.1 cm renal mass with IVC thrombus to the level -09/04/2020: Surgery: Open right nephrectomy with IVC thrombectomy  - Clear-cell RCC, grade 4, invades IVC involved - Sarcomatoid features present - pT3c N0/11 R0 -04/10/2022: CT CAP: Findings concerning for progressive metastatic disease including enlarging hepatic metastasis, increased nodularity within the right nephrectomy bed, enlarging pulmonary nodules, and worsening multistation lymphadenopathy.  Interval development of a wedge-shaped focus of hypoattenuation in the superior spleen, favored to represent interval infarct.  Similar 3 cm fusiform infrarenal abdominal aortic aneurysm -04/23/2022: Ultrasound liver biopsy revealed SLL/CLL on core biopsy with atypical lymphoid cells on liver FNA (CLL component is likely blood contaminant) -05/26/2022: Repeat biopsy of liver confirmed RCC -06/17/2022: Pembrolizumab  every 42 days -07/2022: Started axitinib  later than pembrolizumab  due to delay in access.  He is currently on 3 mg twice a day and did not tolerate 6 mg twice a day with nausea and loss of appetite -01/09/2023: CT CAP: Lungs: 6 mm right apical pulmonary nodule that was not  seen prior.  Additional subcentimeter pulmonary nodules.  Liver: Overall small size and distribution of low-attenuation lesions.  The lesion in the right hepatic dome and measures approximately 1.8 into 3.5 cm, no new suspicious focal findings.  Kidneys: Right nephrectomy with similar soft tissue nodules in the resection bed, with an index nodule measuring 1.4 and 1 cm.  No left hydronephrosis.  Similar exophytic left renal cyst -06/11/2023: Held Axitinib  for malignant intertrigo -07/13/2023: CT CAP with contrast -CHEST: Rounded nodule in the medial aspect of the RIGHT lower lobe is concerning for renal cell carcinoma metastasis. No thoracic adenopathy. -PELVIS: Post RIGHT nephrectomy. No evidence of local recurrence. Two low-density lesions in the RIGHT hepatic lobe are indeterminate. Consider MRI of the liver evaluate for metastasis. Mildly enlarged external iliac lymph nodes are NOT favored renal cell carcinoma metastasis. Stable round enlargement of the LEFT adrenal gland is stable. No skeletal metastasis.  -07/16/2023-current: Lenvatinib  plus pembrolizumab   - Pembrolizumab  held on 10/15/2023 for bilateral leg cellulitis  Current Treatment: Pembrolizumab  plus axitinib   INTERVAL HISTORY: George Anthony is here today for follow-up with his renal cell carcinoma. He is accompanied by his wife.  Zimere's wife notes that there has been improvement in the cellulitis on his legs since using antibiotics.  He still has some dry skin but goes away with moisturizer.  Patient reports no pain today.  His skin condition significantly improved.  He has no other complaints today.  Travone is smoking roughly half a pack a day.  I have reviewed the past medical history, past surgical history, social history and family history with the patient and they are unchanged from previous note.  ALLERGIES:  has no known allergies.  MEDICATIONS:  Current Outpatient Medications  Medication Sig Dispense Refill    acetaminophen  (TYLENOL ) 500 MG tablet Take 1,300 mg by mouth every 6 (six) hours as needed for moderate pain. 650 mg tab     atorvastatin  (LIPITOR) 40 MG tablet TAKE 1 TABLET BY MOUTH AT BEDTIME 90 tablet 0   clobetasol  cream (TEMOVATE ) 0.05 % APPLY TOPICALLY TWICE DAILY 30 g 0   ELIQUIS  5 MG TABS tablet Take 1 tablet by mouth twice daily 180 tablet 0   escitalopram  (LEXAPRO ) 10 MG tablet Take 1 tablet by mouth once daily 90 tablet 0   furosemide  (LASIX ) 20 MG tablet Take 2 tablets (40 mg total) by mouth daily. 90 tablet 0   gabapentin  (NEURONTIN ) 100 MG capsule Take 1 capsule by mouth three times daily as needed 30 capsule 0   lenvatinib  10 mg daily dose (LENVIMA , 10 MG DAILY DOSE,) capsule Take 1 capsule (10 mg total) by mouth daily. 30 capsule 3   levothyroxine  (SYNTHROID ) 100 MCG tablet Take 1 tablet (100 mcg total) by mouth daily before breakfast. 30 tablet 2   nystatin  (MYCOSTATIN /NYSTOP ) powder Apply 1 Application topically 2 (two) times daily.     oxyCODONE  (OXY IR/ROXICODONE ) 5 MG immediate release tablet Take 1-2 tablets (5-10 mg total) by mouth every 4 (four) hours as needed for moderate pain (pain score 4-6), severe pain (pain score 7-10) or breakthrough pain. 120 tablet 0   prochlorperazine  (COMPAZINE ) 10 MG tablet Take 10 mg by mouth every 6 (six) hours as needed.     silver  sulfADIAZINE  (SILVADENE ) 1 % cream Apply 1 Application topically daily. 50 g 0   terbinafine  (LAMISIL ) 1 % cream Apply 1 Application topically 2 (two) times daily. 30 g 2   No current facility-administered medications for this visit.    REVIEW OF SYSTEMS:   Constitutional: Denies fevers, chills or abnormal weight loss Eyes: Denies blurriness of vision Ears, nose, mouth, throat, and face: Denies mucositis or sore throat Respiratory: Denies cough, dyspnea or wheezes Cardiovascular: Denies palpitation, chest discomfort or lower extremity swelling Gastrointestinal:  Denies nausea, heartburn or change in bowel  habits Skin: Denies abnormal skin rashes Lymphatics: Denies new lymphadenopathy or easy bruising Neurological:Denies numbness, tingling or new weaknesses Behavioral/Psych: Mood is stable, no new changes  All other systems were reviewed with the patient and are negative.   VITALS:   Pulse 88, temperature 99 F (37.2 C), temperature source Tympanic, resp. rate 18, height 5' 3.81 (1.621 m), weight 204 lb (92.5 kg), SpO2 95%.  Wt Readings from Last 3 Encounters:  11/04/23 204 lb (92.5 kg)  10/23/23 198 lb 6.4 oz (90 kg)  10/21/23 206 lb (93.4 kg)    Body mass index is 35.23 kg/m.  Performance status (ECOG): 1 - Symptomatic but completely ambulatory  PHYSICAL EXAM:   GENERAL:alert, no distress and comfortable SKIN: Bilateral leg dry skin with no ulceration or erythema.  Previous intertrigo resolved. OROPHARYNX:no exudate, no erythema and lips, buccal mucosa, and tongue normal  LUNGS: clear to auscultation and percussion with normal breathing effort HEART: regular rate &  rhythm and no murmurs and no lower extremity edema ABDOMEN:abdomen soft, non-tender and normal bowel sounds Musculoskeletal:no cyanosis of digits and no clubbing  NEURO: alert & oriented x 3 with fluent speech   LABORATORY DATA:  I have reviewed the data as listed    Component Value Date/Time   NA 139 11/04/2023 1336   NA 137 10/23/2023 0900   K 4.2 11/04/2023 1336   CL 99 11/04/2023 1336   CO2 29 11/04/2023 1336   GLUCOSE 114 (H) 11/04/2023 1336   BUN 14 11/04/2023 1336   BUN 13 10/23/2023 0900   CREATININE 1.42 (H) 11/04/2023 1336   CALCIUM  9.0 11/04/2023 1336   PROT 6.5 11/04/2023 1336   PROT 6.1 10/23/2023 0900   ALBUMIN 4.0 11/04/2023 1336   ALBUMIN 4.0 10/23/2023 0900   AST 16 11/04/2023 1336   ALT 7 11/04/2023 1336   ALKPHOS 103 11/04/2023 1336   BILITOT 0.5 11/04/2023 1336   BILITOT 0.5 10/23/2023 0900   GFRNONAA 53 (L) 11/04/2023 1336   GFRAA 123 08/17/2019 1636    Lab Results   Component Value Date   WBC 18.5 (H) 11/04/2023   NEUTROABS 6.2 11/04/2023   HGB 14.9 11/04/2023   HCT 45.6 11/04/2023   MCV 101.1 (H) 11/04/2023   PLT 168 11/04/2023      Chemistry      Component Value Date/Time   NA 139 11/04/2023 1336   NA 137 10/23/2023 0900   K 4.2 11/04/2023 1336   CL 99 11/04/2023 1336   CO2 29 11/04/2023 1336   BUN 14 11/04/2023 1336   BUN 13 10/23/2023 0900   CREATININE 1.42 (H) 11/04/2023 1336      Component Value Date/Time   CALCIUM  9.0 11/04/2023 1336   ALKPHOS 103 11/04/2023 1336   AST 16 11/04/2023 1336   ALT 7 11/04/2023 1336   BILITOT 0.5 11/04/2023 1336   BILITOT 0.5 10/23/2023 0900      Latest Reference Range & Units 11/04/23 13:36  TSH 0.350 - 4.500 uIU/mL 25.600 (H)  (H): Data is abnormally high  RADIOGRAPHIC STUDIES: I have personally reviewed the radiological images as listed and agreed with the findings in the report.  None new to review

## 2023-11-05 LAB — T4: T4, Total: 6 ug/dL (ref 4.5–12.0)

## 2023-11-12 ENCOUNTER — Other Ambulatory Visit: Payer: Self-pay

## 2023-11-16 ENCOUNTER — Other Ambulatory Visit: Payer: Self-pay

## 2023-11-16 ENCOUNTER — Other Ambulatory Visit: Payer: Self-pay | Admitting: Oncology

## 2023-11-16 MED ORDER — LENVATINIB (10 MG DAILY DOSE) 10 MG PO CPPK
10.0000 mg | ORAL_CAPSULE | Freq: Every day | ORAL | 3 refills | Status: AC
  Filled 2023-11-16 – 2023-11-20 (×2): qty 30, 30d supply, fill #0
  Filled 2023-12-16: qty 30, 30d supply, fill #1
  Filled 2024-01-13 – 2024-01-19 (×2): qty 30, 30d supply, fill #2
  Filled 2024-02-10 – 2024-02-18 (×4): qty 30, 30d supply, fill #3

## 2023-11-18 ENCOUNTER — Other Ambulatory Visit (HOSPITAL_COMMUNITY): Payer: Self-pay

## 2023-11-20 ENCOUNTER — Other Ambulatory Visit: Payer: Self-pay | Admitting: *Deleted

## 2023-11-20 ENCOUNTER — Other Ambulatory Visit: Payer: Self-pay

## 2023-11-20 NOTE — Progress Notes (Signed)
 Specialty Pharmacy Ongoing Clinical Assessment Note  George Anthony is a 73 y.o. male who is being followed by the specialty pharmacy service for RxSp Oncology   Patient's specialty medication(s) reviewed today: Lenvatinib  Mesylate (LENVIMA )   Missed doses in the last 4 weeks: 0   Patient/Caregiver did not have any additional questions or concerns.   Therapeutic benefit summary: Patient is achieving benefit   Adverse events/side effects summary: No adverse events/side effects   Patient's therapy is appropriate to: Continue    Goals Addressed             This Visit's Progress    Slow Disease Progression   On track    Patient is on track. Patient will maintain adherence.  Dr. Davonna documented that the CT from 07/15/23 showed stable disease. Plan to repeat imaging prior to visit in 3 weeks         Follow up: 3 months  [facility]

## 2023-11-20 NOTE — Progress Notes (Signed)
 Specialty Pharmacy Refill Coordination Note  George Anthony is a 73 y.o. male contacted today regarding refills of specialty medication(s) Lenvatinib  Mesylate (LENVIMA )  Spoke with patient wife  Patient requested Delivery   Delivery date: 11/24/23   Verified address: [address] [place] [address]   Medication will be filled on: 11/23/23

## 2023-11-23 ENCOUNTER — Other Ambulatory Visit: Payer: Self-pay

## 2023-11-26 ENCOUNTER — Inpatient Hospital Stay

## 2023-11-26 ENCOUNTER — Inpatient Hospital Stay: Admitting: Oncology

## 2023-11-27 ENCOUNTER — Other Ambulatory Visit: Payer: Self-pay | Admitting: Oncology

## 2023-11-29 ENCOUNTER — Other Ambulatory Visit: Payer: Self-pay | Admitting: *Deleted

## 2023-11-29 DIAGNOSIS — E1169 Type 2 diabetes mellitus with other specified complication: Secondary | ICD-10-CM

## 2023-11-30 ENCOUNTER — Other Ambulatory Visit: Payer: Self-pay | Admitting: Family Medicine

## 2023-11-30 DIAGNOSIS — Z86718 Personal history of other venous thrombosis and embolism: Secondary | ICD-10-CM

## 2023-12-02 ENCOUNTER — Encounter: Payer: Self-pay | Admitting: *Deleted

## 2023-12-04 ENCOUNTER — Ambulatory Visit (HOSPITAL_COMMUNITY)
Admission: RE | Admit: 2023-12-04 | Discharge: 2023-12-04 | Disposition: A | Discharge status: Home / Self Care | Source: Ambulatory Visit | Attending: Oncology | Admitting: Oncology

## 2023-12-04 ENCOUNTER — Inpatient Hospital Stay: Attending: Oncology

## 2023-12-04 DIAGNOSIS — I714 Abdominal aortic aneurysm, without rupture, unspecified: Secondary | ICD-10-CM | POA: Insufficient documentation

## 2023-12-04 DIAGNOSIS — C641 Malignant neoplasm of right kidney, except renal pelvis: Secondary | ICD-10-CM | POA: Insufficient documentation

## 2023-12-04 DIAGNOSIS — L304 Erythema intertrigo: Secondary | ICD-10-CM | POA: Insufficient documentation

## 2023-12-04 DIAGNOSIS — Z79890 Hormone replacement therapy (postmenopausal): Secondary | ICD-10-CM | POA: Insufficient documentation

## 2023-12-04 DIAGNOSIS — Z7984 Long term (current) use of oral hypoglycemic drugs: Secondary | ICD-10-CM | POA: Insufficient documentation

## 2023-12-04 DIAGNOSIS — L139 Bullous disorder, unspecified: Secondary | ICD-10-CM | POA: Insufficient documentation

## 2023-12-04 DIAGNOSIS — Z79899 Other long term (current) drug therapy: Secondary | ICD-10-CM | POA: Insufficient documentation

## 2023-12-04 DIAGNOSIS — C9111 Chronic lymphocytic leukemia of B-cell type in remission: Secondary | ICD-10-CM | POA: Insufficient documentation

## 2023-12-04 DIAGNOSIS — E278 Other specified disorders of adrenal gland: Secondary | ICD-10-CM | POA: Insufficient documentation

## 2023-12-04 DIAGNOSIS — Z86718 Personal history of other venous thrombosis and embolism: Secondary | ICD-10-CM | POA: Insufficient documentation

## 2023-12-04 DIAGNOSIS — F17210 Nicotine dependence, cigarettes, uncomplicated: Secondary | ICD-10-CM | POA: Insufficient documentation

## 2023-12-04 DIAGNOSIS — C7800 Secondary malignant neoplasm of unspecified lung: Secondary | ICD-10-CM | POA: Insufficient documentation

## 2023-12-04 DIAGNOSIS — E039 Hypothyroidism, unspecified: Secondary | ICD-10-CM | POA: Insufficient documentation

## 2023-12-04 DIAGNOSIS — C787 Secondary malignant neoplasm of liver and intrahepatic bile duct: Secondary | ICD-10-CM | POA: Insufficient documentation

## 2023-12-04 DIAGNOSIS — Z905 Acquired absence of kidney: Secondary | ICD-10-CM | POA: Insufficient documentation

## 2023-12-04 MED ORDER — IOHEXOL 300 MG/ML  SOLN
100.0000 mL | Freq: Once | INTRAMUSCULAR | Status: AC | PRN
  Administered 2023-12-04: 100 mL via INTRAVENOUS

## 2023-12-07 ENCOUNTER — Other Ambulatory Visit: Payer: Self-pay | Admitting: *Deleted

## 2023-12-07 DIAGNOSIS — F411 Generalized anxiety disorder: Secondary | ICD-10-CM

## 2023-12-10 ENCOUNTER — Inpatient Hospital Stay

## 2023-12-10 ENCOUNTER — Inpatient Hospital Stay: Admitting: Oncology

## 2023-12-10 VITALS — BP 119/80 | HR 95 | Temp 97.4°F | Resp 18 | Ht 63.5 in | Wt 200.0 lb

## 2023-12-10 DIAGNOSIS — L309 Dermatitis, unspecified: Secondary | ICD-10-CM | POA: Diagnosis not present

## 2023-12-10 DIAGNOSIS — C641 Malignant neoplasm of right kidney, except renal pelvis: Secondary | ICD-10-CM | POA: Diagnosis present

## 2023-12-10 DIAGNOSIS — L139 Bullous disorder, unspecified: Secondary | ICD-10-CM | POA: Diagnosis not present

## 2023-12-10 DIAGNOSIS — E039 Hypothyroidism, unspecified: Secondary | ICD-10-CM | POA: Diagnosis not present

## 2023-12-10 DIAGNOSIS — Z905 Acquired absence of kidney: Secondary | ICD-10-CM | POA: Diagnosis not present

## 2023-12-10 DIAGNOSIS — C9111 Chronic lymphocytic leukemia of B-cell type in remission: Secondary | ICD-10-CM | POA: Diagnosis not present

## 2023-12-10 DIAGNOSIS — Z720 Tobacco use: Secondary | ICD-10-CM

## 2023-12-10 DIAGNOSIS — C9110 Chronic lymphocytic leukemia of B-cell type not having achieved remission: Secondary | ICD-10-CM | POA: Diagnosis not present

## 2023-12-10 DIAGNOSIS — Z7984 Long term (current) use of oral hypoglycemic drugs: Secondary | ICD-10-CM | POA: Diagnosis not present

## 2023-12-10 DIAGNOSIS — C787 Secondary malignant neoplasm of liver and intrahepatic bile duct: Secondary | ICD-10-CM | POA: Diagnosis present

## 2023-12-10 DIAGNOSIS — E278 Other specified disorders of adrenal gland: Secondary | ICD-10-CM | POA: Diagnosis not present

## 2023-12-10 DIAGNOSIS — Z79890 Hormone replacement therapy (postmenopausal): Secondary | ICD-10-CM | POA: Diagnosis not present

## 2023-12-10 DIAGNOSIS — Z86718 Personal history of other venous thrombosis and embolism: Secondary | ICD-10-CM | POA: Diagnosis not present

## 2023-12-10 DIAGNOSIS — F17210 Nicotine dependence, cigarettes, uncomplicated: Secondary | ICD-10-CM | POA: Diagnosis not present

## 2023-12-10 DIAGNOSIS — C7800 Secondary malignant neoplasm of unspecified lung: Secondary | ICD-10-CM | POA: Diagnosis not present

## 2023-12-10 DIAGNOSIS — L304 Erythema intertrigo: Secondary | ICD-10-CM | POA: Diagnosis not present

## 2023-12-10 DIAGNOSIS — I714 Abdominal aortic aneurysm, without rupture, unspecified: Secondary | ICD-10-CM | POA: Diagnosis not present

## 2023-12-10 DIAGNOSIS — Z79899 Other long term (current) drug therapy: Secondary | ICD-10-CM | POA: Diagnosis not present

## 2023-12-10 LAB — COMPREHENSIVE METABOLIC PANEL WITH GFR
ALT: 8 U/L (ref 0–44)
AST: 15 U/L (ref 15–41)
Albumin: 3.9 g/dL (ref 3.5–5.0)
Alkaline Phosphatase: 101 U/L (ref 38–126)
Anion gap: 8 (ref 5–15)
BUN: 12 mg/dL (ref 8–23)
CO2: 34 mmol/L — ABNORMAL HIGH (ref 22–32)
Calcium: 8.4 mg/dL — ABNORMAL LOW (ref 8.9–10.3)
Chloride: 97 mmol/L — ABNORMAL LOW (ref 98–111)
Creatinine, Ser: 1.21 mg/dL (ref 0.61–1.24)
GFR, Estimated: >60 mL/min (ref >60)
Glucose, Bld: 130 mg/dL — ABNORMAL HIGH (ref 70–99)
Potassium: 4.1 mmol/L (ref 3.5–5.1)
Sodium: 139 mmol/L (ref 135–145)
Total Bilirubin: 0.4 mg/dL (ref 0.0–1.2)
Total Protein: 6.5 g/dL (ref 6.5–8.1)

## 2023-12-10 LAB — CBC WITH DIFFERENTIAL/PLATELET
Abs Immature Granulocytes: 0.09 K/uL — ABNORMAL HIGH (ref 0.00–0.07)
Basophils Absolute: 0.1 K/uL (ref 0.0–0.1)
Basophils Relative: 0 %
Eosinophils Absolute: 0.2 K/uL (ref 0.0–0.5)
Eosinophils Relative: 1 %
HCT: 46.7 % (ref 39.0–52.0)
Hemoglobin: 15.2 g/dL (ref 13.0–17.0)
Immature Granulocytes: 0 %
Lymphocytes Relative: 62 %
Lymphs Abs: 13.7 K/uL — ABNORMAL HIGH (ref 0.7–4.0)
MCH: 33 pg (ref 26.0–34.0)
MCHC: 32.5 g/dL (ref 30.0–36.0)
MCV: 101.3 fL — ABNORMAL HIGH (ref 80.0–100.0)
Monocytes Absolute: 1.1 K/uL — ABNORMAL HIGH (ref 0.1–1.0)
Monocytes Relative: 5 %
Neutro Abs: 7.1 K/uL (ref 1.7–7.7)
Neutrophils Relative %: 32 %
Platelets: 165 K/uL (ref 150–400)
RBC: 4.61 MIL/uL (ref 4.22–5.81)
RDW: 13.9 % (ref 11.5–15.5)
Smear Review: NORMAL
WBC: 22.2 K/uL — ABNORMAL HIGH (ref 4.0–10.5)
nRBC: 0 % (ref 0.0–0.2)

## 2023-12-10 LAB — TSH: TSH: 18.4 u[IU]/mL — ABNORMAL HIGH (ref 0.350–4.500)

## 2023-12-10 NOTE — Progress Notes (Signed)
 Patient Care Team: George Rock HERO, FNP as PCP - General (Family Medicine) George Carlin POUR, DO as Consulting Physician (Internal Medicine) George Ross Lacy Phebe, MD as Referring Physician (Optometry) George Anthony, George Anthony (Hematology and Oncology)  Clinic Day:  12/10/2023  Referring physician: Severa Rock HERO, FNP   CHIEF COMPLAINT:  CC: Renal cell carcinoma    ASSESSMENT & PLAN:   Assessment & Plan: George Anthony  is a 73 y.o. male with metastatic right-sided renal cell carcinoma   Renal cell carcinoma of right kidney Metastatic renal cell carcinoma with biopsy-proven liver metastasis and lung metastasis. Oncology history as below Started on pembrolizumab  every 6 weeks and axitinib  twice daily in May 2024.  Axitinib  was started later in July 2024 due to problems with access. Held Axitinib  for malignant intertrigo in 06/25 Last imaging was in March 2025 with no evidence of progression Recent CT 06/25 with stable disease Pembrolizumab  was held for bilateral leg cellulitis. Last dose 06/2023  - Continue lenvatinib  10 mg daily.  Patient tolerating well with no complaints. -Will continue to hold pembrolizumab  at this time.  Can consider restarting if patient progresses on lenvatinib  - We reviewed the CT scan findings together.  Patient has no evidence of new metastatic disease at this time and has stable findings.  Repeat CT in 3 months. Due 02/2024 - Labs reviewed today: CMP: Creatinine: 1.21, normal LFTs.  CBC: WBC: Normal hemoglobin and platelets   Return to clinic in 3 months with labs and CT scan   Dermatitis Bilateral lower leg bullous dermatitis.  Likely immunotherapy induced Previously treated for cellulitis and getting worse even after completing the course of antibiotics Evaluated by dermatology and was told not immunotherapy induced Unsure if immunotherapy induced but significantly improved after holding immunotherapy   - Resolved at this time -Will continue  to hold pembrolizumab     CLL Patient had incidental diagnosis of CLL.  RAI stage 0. Asymptomatic. More than 50% increase in lymphocytes previously but recently trended down and more stable     - If lymphocytes continue to trend up and patient develops symptoms, can consider starting on treatment.  Intertrigo Patient likely has malignant intertrigo secondary to axitinib  use. Significantly improved at this time Completed oral fluconazole  course   - Discontinued axitinib    Resolved at this time  Tobacco use Continued tobacco use, a risk factor for kidney cancer.    - Advised to quit smoking to reduce health risks.  Acquired hypothyroidism Likely secondary to immunotherapy TSH and T4 improved with levothyroxine  increase Patient is currently asymptomatic   -Continue levothyroxine  100 mcg daily - Will monitor levels every 6 weeks  Orders Placed This Encounter  Procedures   CT CHEST ABDOMEN PELVIS W CONTRAST    Standing Status:   Future    Expected Date:   03/11/2024    Expiration Date:   12/09/2024    If indicated for the ordered procedure, I authorize the administration of contrast media per Radiology protocol:   Yes    Does the patient have a contrast media/X-ray dye allergy?:   No    Preferred imaging location?:   [facility]    If indicated for the ordered procedure, I authorize the administration of oral contrast media per Radiology protocol:   Yes   CBC with Differential/Platelet    Standing Status:   Future    Expected Date:   03/07/2024    Expiration Date:   06/05/2024   Comprehensive metabolic panel with GFR  Standing Status:   Future    Expected Date:   03/07/2024    Expiration Date:   06/05/2024   TSH    Standing Status:   Future    Expected Date:   03/07/2024    Expiration Date:   06/05/2024   T4, free    Standing Status:   Future    Expected Date:   03/07/2024    Expiration Date:   06/05/2024   Lactate dehydrogenase    Standing Status:   Future     Expected Date:   03/07/2024    Expiration Date:   06/05/2024     The patient understands the plans discussed today and is in agreement with them.  He knows to contact our office if he develops concerns prior to his next appointment.  The total time spent in the appointment was 24 minutes for the encounter with patient, including review of chart and various tests results, discussions about plan of care and coordination of care plan   Mickiel Dry, MD  [facility] [place] - A DEPT OF [facility] [address] [place] [address] Dept: [phone] Dept Fax: [phone]   Orders Placed This Encounter  Procedures   CT CHEST ABDOMEN PELVIS W CONTRAST    Standing Status:   Future    Expected Date:   03/11/2024    Expiration Date:   12/09/2024    If indicated for the ordered procedure, I authorize the administration of contrast media per Radiology protocol:   Yes    Does the patient have a contrast media/X-ray dye allergy?:   No    Preferred imaging location?:   [facility]    If indicated for the ordered procedure, I authorize the administration of oral contrast media per Radiology protocol:   Yes   CBC with Differential/Platelet    Standing Status:   Future    Expected Date:   03/07/2024    Expiration Date:   06/05/2024   Comprehensive metabolic panel with GFR    Standing Status:   Future    Expected Date:   03/07/2024    Expiration Date:   06/05/2024   TSH    Standing Status:   Future    Expected Date:   03/07/2024    Expiration Date:   06/05/2024   T4, free    Standing Status:   Future    Expected Date:   03/07/2024    Expiration Date:   06/05/2024   Lactate dehydrogenase    Standing Status:   Future    Expected Date:   03/07/2024    Expiration Date:   06/05/2024     ONCOLOGY HISTORY:   Diagnosis: Metastatic renal cell carcinoma:  -08/14/2020: 9.1 cm renal mass with IVC thrombus to the level -09/04/2020:  Surgery: Open right nephrectomy with IVC thrombectomy  - Clear-cell RCC, grade 4, invades IVC involved - Sarcomatoid features present - pT3c N0/11 R0 -04/10/2022: CT CAP: Findings concerning for progressive metastatic disease including enlarging hepatic metastasis, increased nodularity within the right nephrectomy bed, enlarging pulmonary nodules, and worsening multistation lymphadenopathy.  Interval development of a wedge-shaped focus of hypoattenuation in the superior spleen, favored to represent interval infarct.  Similar 3 cm fusiform infrarenal abdominal aortic aneurysm -04/23/2022: Ultrasound liver biopsy revealed SLL/CLL on core biopsy with atypical lymphoid cells on liver FNA (CLL component is likely blood contaminant) -05/26/2022: Repeat biopsy of liver confirmed RCC -06/17/2022: Pembrolizumab  every 42  days -07/2022: Started axitinib  later than pembrolizumab  due to delay in access.  He is currently on 3 mg twice a day and did not tolerate 6 mg twice a day with nausea and loss of appetite -01/09/2023: CT CAP: Lungs: 6 mm right apical pulmonary nodule that was not seen prior.  Additional subcentimeter pulmonary nodules.  Liver: Overall small size and distribution of low-attenuation lesions.  The lesion in the right hepatic dome and measures approximately 1.8 into 3.5 cm, no new suspicious focal findings.  Kidneys: Right nephrectomy with similar soft tissue nodules in the resection bed, with an index nodule measuring 1.4 and 1 cm.  No left hydronephrosis.  Similar exophytic left renal cyst -06/11/2023: Held Axitinib  for malignant intertrigo -07/13/2023: CT CAP with contrast -CHEST: Rounded nodule in the medial aspect of the RIGHT lower lobe is concerning for renal cell carcinoma metastasis. No thoracic adenopathy. -PELVIS: Post RIGHT nephrectomy. No evidence of local recurrence. Two low-density lesions in the RIGHT hepatic lobe are indeterminate. Consider MRI of the liver evaluate for metastasis.  Mildly enlarged external iliac lymph nodes are NOT favored renal cell carcinoma metastasis. Stable round enlargement of the LEFT adrenal gland is stable. No skeletal metastasis.  -07/16/2023-current: Lenvatinib  plus pembrolizumab   - Pembrolizumab  held on 10/15/2023 for bilateral leg cellulitis -12/04/2023: CT CAP: No acute findings. No evidence of new or progressive metastatic disease. Stable shotty abdominal and pelvic lymph nodes. Stable small left adrenal nodule. Stable tiny sub-cm pulmonary nodules.  Current Treatment: Lenvatinib  10mg  daily  INTERVAL HISTORY: Discussed the use of AI scribe software for clinical note transcription with the patient, who gave verbal consent to proceed.  History of Present Illness George Anthony is a 73 year old male who presents for follow-up regarding his cancer treatment for renal cell carcinoma.  He is accompanied by his wife today.  He has not received pembrolizumab  since June, approximately four months ago, and recent scans show no evidence of new disease. The lymph nodes that were present before remain unchanged in size. He continues to take his lenvatinib , which appears to be effective as his condition remains stable without recent infusions.  He has not experienced any side effects from his lenvatinib . He recalls feeling nauseated the last time he was sick, but cannot remember when that was. Currently, he is eating better and his appetite has improved.  He has a history of thyroid  issues, with previously unstable thyroid  levels. He has not completed his labs today, which are necessary to monitor his thyroid  function.  His rashes have resolved.  He continues to smoke half a pack of cigarettes per day, a reduction from his previous habit of two packs a day. Smoking is a shared activity with his partner.   I have reviewed the past medical history, past surgical history, social history and family history with the patient and they are unchanged from  previous note.  ALLERGIES:  has no known allergies.  MEDICATIONS:  Current Outpatient Medications  Medication Sig Dispense Refill   acetaminophen  (TYLENOL ) 500 MG tablet Take 1,300 mg by mouth every 6 (six) hours as needed for moderate pain. 650 mg tab     atorvastatin  (LIPITOR) 40 MG tablet TAKE 1 TABLET BY MOUTH AT BEDTIME 90 tablet 0   clobetasol  cream (TEMOVATE ) 0.05 % APPLY TOPICALLY TWICE DAILY 30 g 0   ELIQUIS  5 MG TABS tablet Take 1 tablet by mouth twice daily 180 tablet 0   escitalopram  (LEXAPRO ) 10 MG tablet Take 1 tablet by mouth once  daily 90 tablet 0   furosemide  (LASIX ) 20 MG tablet Take 2 tablets (40 mg total) by mouth daily. 90 tablet 0   lenvatinib  10 mg daily dose (LENVIMA ) capsule Take 1 capsule (10 mg total) by mouth daily. 30 capsule 3   levothyroxine  (SYNTHROID ) 100 MCG tablet Take 1 tablet (100 mcg total) by mouth daily before breakfast. 30 tablet 2   nystatin  (MYCOSTATIN /NYSTOP ) powder Apply 1 Application topically 2 (two) times daily.     prochlorperazine  (COMPAZINE ) 10 MG tablet Take 10 mg by mouth every 6 (six) hours as needed.     silver  sulfADIAZINE  (SILVADENE ) 1 % cream Apply 1 Application topically daily. 50 g 0   terbinafine  (LAMISIL ) 1 % cream Apply 1 Application topically 2 (two) times daily. 30 g 2   No current facility-administered medications for this visit.    REVIEW OF SYSTEMS:   Constitutional: Denies fevers, chills or abnormal weight loss Eyes: Denies blurriness of vision Ears, nose, mouth, throat, and face: Denies mucositis or sore throat Respiratory: Denies cough, dyspnea or wheezes Cardiovascular: Denies palpitation, chest discomfort or lower extremity swelling Gastrointestinal:  Denies nausea, heartburn or change in bowel habits Skin: Denies abnormal skin rashes Lymphatics: Denies new lymphadenopathy or easy bruising Neurological:Denies numbness, tingling or new weaknesses Behavioral/Psych: Mood is stable, no new changes  All other  systems were reviewed with the patient and are negative.   VITALS:   Blood pressure 119/80, pulse 95, temperature (!) 97.4 F (36.3 C), temperature source Tympanic, resp. rate 18, height 5' 3.5 (1.613 m), weight 200 lb (90.7 kg), SpO2 95%.  Wt Readings from Last 3 Encounters:  12/10/23 200 lb (90.7 kg)  11/04/23 204 lb (92.5 kg)  10/23/23 198 lb 6.4 oz (90 kg)    Body mass index is 34.87 kg/m.  Performance status (ECOG): 1 - Symptomatic but completely ambulatory  PHYSICAL EXAM:   GENERAL:alert, no distress and comfortable SKIN: Bilateral leg dry skin with no ulceration or erythema.  Previous cellulitis resolved.  Previous intertrigo resolved. OROPHARYNX:no exudate, no erythema and lips, buccal mucosa, and tongue normal  LUNGS: clear to auscultation and percussion with normal breathing effort HEART: regular rate & rhythm and no murmurs and no lower extremity edema ABDOMEN:abdomen soft, non-tender and normal bowel sounds Musculoskeletal:no cyanosis of digits and no clubbing  NEURO: alert & oriented x 3 with fluent speech   LABORATORY DATA:  I have reviewed the data as listed    Component Value Date/Time   NA 139 12/10/2023 0829   NA 137 10/23/2023 0900   K 4.1 12/10/2023 0829   CL 97 (L) 12/10/2023 0829   CO2 34 (H) 12/10/2023 0829   GLUCOSE 130 (H) 12/10/2023 0829   BUN 12 12/10/2023 0829   BUN 13 10/23/2023 0900   CREATININE 1.21 12/10/2023 0829   CALCIUM  8.4 (L) 12/10/2023 0829   PROT 6.5 12/10/2023 0829   PROT 6.1 10/23/2023 0900   ALBUMIN 3.9 12/10/2023 0829   ALBUMIN 4.0 10/23/2023 0900   AST 15 12/10/2023 0829   ALT 8 12/10/2023 0829   ALKPHOS 101 12/10/2023 0829   BILITOT 0.4 12/10/2023 0829   BILITOT 0.5 10/23/2023 0900   GFRNONAA >60 12/10/2023 0829   GFRAA 123 08/17/2019 1636    Lab Results  Component Value Date   WBC 22.2 (H) 12/10/2023   NEUTROABS 7.1 12/10/2023   HGB 15.2 12/10/2023   HCT 46.7 12/10/2023   MCV 101.3 (H) 12/10/2023   PLT 165  12/10/2023  Chemistry      Component Value Date/Time   NA 139 12/10/2023 0829   NA 137 10/23/2023 0900   K 4.1 12/10/2023 0829   CL 97 (L) 12/10/2023 0829   CO2 34 (H) 12/10/2023 0829   BUN 12 12/10/2023 0829   BUN 13 10/23/2023 0900   CREATININE 1.21 12/10/2023 0829      Component Value Date/Time   CALCIUM  8.4 (L) 12/10/2023 0829   ALKPHOS 101 12/10/2023 0829   AST 15 12/10/2023 0829   ALT 8 12/10/2023 0829   BILITOT 0.4 12/10/2023 0829   BILITOT 0.5 10/23/2023 0900      Latest Reference Range & Units 12/10/23 08:29  TSH 0.350 - 4.500 uIU/mL 18.400 (H)  (H): Data is abnormally high  RADIOGRAPHIC STUDIES: I have personally reviewed the radiological images as listed and agreed with the findings in the report.  CT CHEST ABDOMEN PELVIS W CONTRAST CLINICAL DATA:  Follow-up renal cell carcinoma. Previous nephrectomy. Surveillance. * Tracking Code: BO *  EXAM: CT CHEST, ABDOMEN, AND PELVIS WITH CONTRAST  TECHNIQUE: Multidetector CT imaging of the chest, abdomen and pelvis was performed following the standard protocol during bolus administration of intravenous contrast.  RADIATION DOSE REDUCTION: This exam was performed according to the departmental dose-optimization program which includes automated exposure control, adjustment of the mA and/or kV according to patient size and/or use of iterative reconstruction technique.  CONTRAST:  OMNIPAQUE  IOHEXOL  300 MG/ML  SOLN  COMPARISON:  08/20/2023 and 08/09/2020  FINDINGS: CT CHEST FINDINGS  Cardiovascular: No acute findings.  Mediastinum/Lymph Nodes: No masses or pathologically enlarged lymph nodes identified.  Lungs/Pleura: A few scattered tiny less than 5 mm nodules in both upper lobes remains stable. No new or enlarging pulmonary nodules or masses identified. No evidence of infiltrate or pleural effusion. Mild emphysema again noted.  Musculoskeletal:  No suspicious bone lesions identified.  CT  ABDOMEN AND PELVIS FINDINGS  Hepatobiliary: No masses identified. Tiny perfusion anomaly seen in the subcapsular anterior right hepatic lobe. Prior cholecystectomy. No evidence of biliary obstruction.  Pancreas:  No mass or inflammatory changes.  Spleen:  Within normal limits in size and appearance.  Adrenals/Urinary tract: Small left adrenal nodule measuring approximately 1.6 cm remains stable. Prior right nephrectomy again noted. Small benign-appearing left renal cyst is stable. No No evidence of renal mass. No No evidence of ureteral calculi or hydronephrosis.  Stomach/Bowel: No evidence of obstruction, inflammatory process, or abnormal fluid collections. Diverticulosis is seen mainly involving the sigmoid colon, however there is no evidence of diverticulitis.  Vascular/Lymphatic: Multiple shotty sub-cm lymph nodes throughout the abdominal retroperitoneal remain stable. Shotty sub-cm lymph nodes throughout the porta hepatis and gastrohepatic ligament also remain stable. Shotty bilateral iliac lymphadenopathy remains stable, with largest lymph node measuring 13 mm in the right external iliac chain, and 11 mm in the left external iliac chain. No new or increased sites of lymphadenopathy identified.  3.5 cm distal abdominal aortic aneurysm shows no significant change. No acute vascular findings.  Reproductive:  No mass or other significant abnormality identified.  Other:  None.  Musculoskeletal: No suspicious bone lesions identified. Chronic vertebral body compression fractures are again seen in the mid and lower thoracic spine, without change.  IMPRESSION: No acute findings. No evidence of new or progressive metastatic disease.  Stable shotty abdominal and pelvic lymph nodes. Stable small left adrenal nodule.  Stable tiny sub-cm pulmonary nodules.  Colonic diverticulosis, without radiographic evidence of diverticulitis.  Stable 3.5 cm distal abdominal aortic  aneurysm.  Recommend follow-up ultrasound every 3 years.  Electronically Signed   By: Norleen DELENA Kil M.D.   On: 12/06/2023 09:43

## 2023-12-11 LAB — T4: T4, Total: 6.4 ug/dL (ref 4.5–12.0)

## 2023-12-16 ENCOUNTER — Other Ambulatory Visit (HOSPITAL_COMMUNITY): Payer: Self-pay

## 2023-12-21 ENCOUNTER — Other Ambulatory Visit: Payer: Self-pay

## 2023-12-21 ENCOUNTER — Other Ambulatory Visit (HOSPITAL_COMMUNITY): Payer: Self-pay

## 2023-12-21 MED ORDER — FUROSEMIDE 20 MG PO TABS: 40.0000 mg | ORAL_TABLET | Freq: Every day | ORAL | 0 refills | Status: DC

## 2023-12-21 NOTE — Progress Notes (Signed)
 Specialty Pharmacy Refill Coordination Note  George Anthony is a 73 y.o. male contacted today regarding refills of specialty medication(s) Lenvatinib  Mesylate (LENVIMA )   Patient requested Delivery   Delivery date: 12/23/23   Verified address: [address] [place] [address]   Medication will be filled on: 12/22/23   Spoke with patient's wife

## 2024-01-13 ENCOUNTER — Other Ambulatory Visit: Payer: Self-pay

## 2024-01-15 ENCOUNTER — Other Ambulatory Visit (HOSPITAL_COMMUNITY): Payer: Self-pay

## 2024-01-18 ENCOUNTER — Telehealth: Payer: Self-pay | Admitting: *Deleted

## 2024-01-18 ENCOUNTER — Other Ambulatory Visit: Payer: Self-pay

## 2024-01-18 ENCOUNTER — Encounter: Payer: Self-pay | Admitting: *Deleted

## 2024-01-18 NOTE — Telephone Encounter (Signed)
 Patient called to advise that fungal infection has gotten worse to waist extending to the genital region.  Describes as very painful and is unable to wear pants at this point due to pain.  Per Dr. Davonna, advised patient to reach out to dermatology, as multiple topical options have been prescribed with no effect.  Offered in office visit tomorrow, however declined as he states he is unable to get out at this time.  Will follow up with dermatology.

## 2024-01-19 ENCOUNTER — Other Ambulatory Visit (HOSPITAL_COMMUNITY): Payer: Self-pay

## 2024-01-19 ENCOUNTER — Other Ambulatory Visit: Payer: Self-pay | Admitting: Oncology

## 2024-01-19 ENCOUNTER — Other Ambulatory Visit: Payer: Self-pay

## 2024-01-19 ENCOUNTER — Telehealth: Payer: Self-pay | Admitting: *Deleted

## 2024-01-19 ENCOUNTER — Encounter: Payer: Self-pay | Admitting: Oncology

## 2024-01-19 MED ORDER — TERBINAFINE HCL 1 % EX CREA: 1.0000 | TOPICAL_CREAM | Freq: Two times a day (BID) | CUTANEOUS | 2 refills | Status: AC

## 2024-01-19 MED ORDER — FLUCONAZOLE 150 MG PO TABS: 150.0000 mg | ORAL_TABLET | ORAL | 0 refills | Status: AC

## 2024-01-19 NOTE — Progress Notes (Signed)
 Specialty Pharmacy Refill Coordination Note  George Anthony is a 73 y.o. male contacted today regarding refills of specialty medication(s) Lenvatinib  Mesylate (LENVIMA )   Patient requested Delivery   Delivery date: 01/22/24   Verified address: [address] [place] [address]   Medication will be filled on: 01/20/24   Spoke with patient's wife

## 2024-01-19 NOTE — Telephone Encounter (Signed)
 Called patient to discuss evolving rash.  States that he was not able to get an appointment to see dermatology.  Offered in person appointment, per Dr. Armanda recommendations.  Patient declines appointment at this time and states that he will just continue using topical treatment.  Dr. Davonna made aware.

## 2024-01-19 NOTE — Telephone Encounter (Signed)
 Please advise

## 2024-01-26 ENCOUNTER — Ambulatory Visit

## 2024-01-28 ENCOUNTER — Ambulatory Visit: Payer: Self-pay | Admitting: Family Medicine

## 2024-02-02 ENCOUNTER — Other Ambulatory Visit: Payer: Self-pay | Admitting: Oncology

## 2024-02-04 ENCOUNTER — Other Ambulatory Visit: Payer: Self-pay

## 2024-02-09 ENCOUNTER — Encounter: Payer: Self-pay | Admitting: *Deleted

## 2024-02-09 ENCOUNTER — Other Ambulatory Visit: Payer: Self-pay

## 2024-02-10 ENCOUNTER — Other Ambulatory Visit: Payer: Self-pay

## 2024-02-11 ENCOUNTER — Other Ambulatory Visit: Payer: Self-pay | Admitting: Pharmacy Technician

## 2024-02-11 ENCOUNTER — Other Ambulatory Visit (HOSPITAL_COMMUNITY): Payer: Self-pay

## 2024-02-11 ENCOUNTER — Other Ambulatory Visit: Payer: Self-pay

## 2024-02-11 ENCOUNTER — Encounter: Payer: Self-pay | Admitting: Oncology

## 2024-02-15 ENCOUNTER — Other Ambulatory Visit: Payer: Self-pay | Admitting: Pharmacy Technician

## 2024-02-15 ENCOUNTER — Other Ambulatory Visit: Payer: Self-pay | Admitting: Oncology

## 2024-02-16 ENCOUNTER — Other Ambulatory Visit (HOSPITAL_COMMUNITY): Payer: Self-pay

## 2024-02-16 ENCOUNTER — Other Ambulatory Visit: Payer: Self-pay

## 2024-02-18 ENCOUNTER — Other Ambulatory Visit: Payer: Self-pay

## 2024-02-18 ENCOUNTER — Other Ambulatory Visit (HOSPITAL_COMMUNITY): Payer: Self-pay

## 2024-02-18 NOTE — Progress Notes (Signed)
 Specialty Pharmacy Ongoing Clinical Assessment Note  George Anthony is a 74 y.o. male who is being followed by the specialty pharmacy service for RxSp Oncology   Patient's specialty medication(s) reviewed today: Lenvatinib  Mesylate (LENVIMA )   Missed doses in the last 4 weeks: 0   Patient/Caregiver did not have any additional questions or concerns.   Therapeutic benefit summary: Patient is achieving benefit   Adverse events/side effects summary: No adverse events/side effects   Patient's therapy is appropriate to: Continue    Goals Addressed             This Visit's Progress    Slow Disease Progression   On track    Patient is on track. Patient will maintain adherence.  Dr. Davonna documented that the most recent CT scan showed no evidence of new metastatic disease at this time and has stable findings at office visit on 12/10/23.         Follow up: 3 months  Silvano LOISE Dolly Specialty Pharmacist

## 2024-02-18 NOTE — Progress Notes (Signed)
 Specialty Pharmacy Refill Coordination Note  George Anthony is a 74 y.o. male contacted today regarding refills of specialty medication(s) Lenvatinib  Mesylate (LENVIMA )  Spoke with Wife  Patient requested Delivery   Delivery date: 02/19/24   Verified address: [address] [place]   Medication will be filled on: 02/18/24

## 2024-03-04 ENCOUNTER — Inpatient Hospital Stay: Attending: Oncology

## 2024-03-04 ENCOUNTER — Ambulatory Visit (HOSPITAL_COMMUNITY)

## 2024-03-11 ENCOUNTER — Inpatient Hospital Stay: Admitting: Oncology

## 2024-04-26 ENCOUNTER — Ambulatory Visit: Admitting: Family Medicine

## 2024-05-04 ENCOUNTER — Ambulatory Visit: Admitting: Family Medicine

## 2024-10-21 ENCOUNTER — Ambulatory Visit: Payer: Self-pay
